# Patient Record
Sex: Female | Born: 1950
Health system: Southern US, Community
[De-identification: ages and names within clinical notes are randomized; demographics above are authoritative.]

## PROBLEM LIST (undated history)

## (undated) DIAGNOSIS — I10 Essential (primary) hypertension: Secondary | ICD-10-CM

## (undated) DIAGNOSIS — M810 Age-related osteoporosis without current pathological fracture: Secondary | ICD-10-CM

## (undated) DIAGNOSIS — E785 Hyperlipidemia, unspecified: Secondary | ICD-10-CM

## (undated) DIAGNOSIS — M1990 Unspecified osteoarthritis, unspecified site: Secondary | ICD-10-CM

## (undated) DIAGNOSIS — R011 Cardiac murmur, unspecified: Secondary | ICD-10-CM

## (undated) DIAGNOSIS — F329 Major depressive disorder, single episode, unspecified: Secondary | ICD-10-CM

## (undated) DIAGNOSIS — Z5189 Encounter for other specified aftercare: Secondary | ICD-10-CM

## (undated) DIAGNOSIS — L989 Disorder of the skin and subcutaneous tissue, unspecified: Secondary | ICD-10-CM

## (undated) DIAGNOSIS — F419 Anxiety disorder, unspecified: Secondary | ICD-10-CM

## (undated) DIAGNOSIS — F32A Depression, unspecified: Secondary | ICD-10-CM

## (undated) HISTORY — DX: Hyperlipidemia, unspecified: E78.5

## (undated) HISTORY — PX: TENDON REPAIR: SHX5111

## (undated) HISTORY — PX: TONSILLECTOMY: SUR1361

## (undated) HISTORY — DX: Major depressive disorder, single episode, unspecified: F32.9

## (undated) HISTORY — DX: Unspecified osteoarthritis, unspecified site: M19.90

## (undated) HISTORY — PX: OTHER SURGICAL HISTORY: SHX169

## (undated) HISTORY — DX: Encounter for other specified aftercare: Z51.89

## (undated) HISTORY — DX: Cardiac murmur, unspecified: R01.1

## (undated) HISTORY — DX: Depression, unspecified: F32.A

## (undated) HISTORY — DX: Disorder of the skin and subcutaneous tissue, unspecified: L98.9

## (undated) HISTORY — DX: Anxiety disorder, unspecified: F41.9

---
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
## 1998-08-01 ENCOUNTER — Emergency Department (HOSPITAL_COMMUNITY): Admission: EM | Admit: 1998-08-01 | Discharge: 1998-08-01 | Payer: Self-pay | Admitting: Emergency Medicine

## 1998-08-10 ENCOUNTER — Encounter: Admission: RE | Admit: 1998-08-10 | Discharge: 1998-08-10 | Payer: Self-pay | Admitting: Internal Medicine

## 1998-11-21 ENCOUNTER — Encounter: Admission: RE | Admit: 1998-11-21 | Discharge: 1998-11-21 | Payer: Self-pay | Admitting: Internal Medicine

## 1998-12-22 ENCOUNTER — Encounter: Admission: RE | Admit: 1998-12-22 | Discharge: 1998-12-22 | Payer: Self-pay | Admitting: Hematology and Oncology

## 1999-01-05 ENCOUNTER — Other Ambulatory Visit: Admission: RE | Admit: 1999-01-05 | Discharge: 1999-01-05 | Payer: Self-pay | Admitting: *Deleted

## 1999-01-05 ENCOUNTER — Encounter: Admission: RE | Admit: 1999-01-05 | Discharge: 1999-01-05 | Payer: Self-pay | Admitting: Internal Medicine

## 1999-02-27 ENCOUNTER — Encounter: Admission: RE | Admit: 1999-02-27 | Discharge: 1999-02-27 | Payer: Self-pay | Admitting: Internal Medicine

## 1999-07-11 ENCOUNTER — Encounter: Admission: RE | Admit: 1999-07-11 | Discharge: 1999-07-11 | Payer: Self-pay | Admitting: Hematology and Oncology

## 1999-07-27 ENCOUNTER — Encounter: Admission: RE | Admit: 1999-07-27 | Discharge: 1999-07-27 | Payer: Self-pay | Admitting: Internal Medicine

## 1999-08-08 ENCOUNTER — Encounter: Admission: RE | Admit: 1999-08-08 | Discharge: 1999-08-08 | Payer: Self-pay | Admitting: Hematology and Oncology

## 1999-09-22 ENCOUNTER — Encounter: Admission: RE | Admit: 1999-09-22 | Discharge: 1999-09-22 | Payer: Self-pay | Admitting: Obstetrics & Gynecology

## 1999-10-13 ENCOUNTER — Encounter: Admission: RE | Admit: 1999-10-13 | Discharge: 1999-10-13 | Payer: Self-pay | Admitting: Obstetrics & Gynecology

## 1999-10-27 ENCOUNTER — Encounter: Admission: RE | Admit: 1999-10-27 | Discharge: 1999-10-27 | Payer: Self-pay | Admitting: Internal Medicine

## 1999-11-24 ENCOUNTER — Encounter: Admission: RE | Admit: 1999-11-24 | Discharge: 1999-11-24 | Payer: Self-pay | Admitting: Internal Medicine

## 2000-01-16 ENCOUNTER — Encounter: Admission: RE | Admit: 2000-01-16 | Discharge: 2000-01-16 | Payer: Self-pay | Admitting: Obstetrics & Gynecology

## 2000-02-05 ENCOUNTER — Ambulatory Visit (HOSPITAL_COMMUNITY): Admission: RE | Admit: 2000-02-05 | Discharge: 2000-02-05 | Payer: Self-pay | Admitting: Obstetrics

## 2000-02-22 ENCOUNTER — Encounter: Admission: RE | Admit: 2000-02-22 | Discharge: 2000-02-22 | Payer: Self-pay | Admitting: Obstetrics

## 2000-03-21 ENCOUNTER — Encounter: Admission: RE | Admit: 2000-03-21 | Discharge: 2000-03-21 | Payer: Self-pay | Admitting: Obstetrics

## 2000-04-24 ENCOUNTER — Encounter: Admission: RE | Admit: 2000-04-24 | Discharge: 2000-04-24 | Payer: Self-pay | Admitting: Internal Medicine

## 2000-07-01 ENCOUNTER — Encounter: Admission: RE | Admit: 2000-07-01 | Discharge: 2000-07-01 | Payer: Self-pay | Admitting: Internal Medicine

## 2000-07-10 ENCOUNTER — Encounter: Payer: Self-pay | Admitting: *Deleted

## 2000-07-10 ENCOUNTER — Encounter: Admission: RE | Admit: 2000-07-10 | Discharge: 2000-07-10 | Payer: Self-pay | Admitting: *Deleted

## 2000-07-31 ENCOUNTER — Encounter: Admission: RE | Admit: 2000-07-31 | Discharge: 2000-07-31 | Payer: Self-pay | Admitting: Hematology and Oncology

## 2000-09-06 ENCOUNTER — Encounter: Admission: RE | Admit: 2000-09-06 | Discharge: 2000-09-06 | Payer: Self-pay | Admitting: Internal Medicine

## 2000-09-10 ENCOUNTER — Encounter: Admission: RE | Admit: 2000-09-10 | Discharge: 2000-09-10 | Payer: Self-pay | Admitting: Hematology and Oncology

## 2000-09-24 ENCOUNTER — Encounter: Admission: RE | Admit: 2000-09-24 | Discharge: 2000-09-24 | Payer: Self-pay | Admitting: Internal Medicine

## 2000-09-30 ENCOUNTER — Encounter: Admission: RE | Admit: 2000-09-30 | Discharge: 2000-09-30 | Payer: Self-pay | Admitting: Internal Medicine

## 2000-11-05 ENCOUNTER — Encounter: Admission: RE | Admit: 2000-11-05 | Discharge: 2000-11-05 | Payer: Self-pay | Admitting: Internal Medicine

## 2001-01-15 ENCOUNTER — Other Ambulatory Visit: Admission: RE | Admit: 2001-01-15 | Discharge: 2001-01-15 | Payer: Self-pay | Admitting: Internal Medicine

## 2001-01-16 ENCOUNTER — Encounter: Payer: Self-pay | Admitting: Internal Medicine

## 2001-01-16 ENCOUNTER — Ambulatory Visit (HOSPITAL_COMMUNITY): Admission: RE | Admit: 2001-01-16 | Discharge: 2001-01-16 | Payer: Self-pay | Admitting: Internal Medicine

## 2002-01-20 ENCOUNTER — Other Ambulatory Visit: Admission: RE | Admit: 2002-01-20 | Discharge: 2002-01-20 | Payer: Self-pay | Admitting: Internal Medicine

## 2002-05-15 ENCOUNTER — Encounter: Payer: Self-pay | Admitting: General Surgery

## 2002-05-15 ENCOUNTER — Inpatient Hospital Stay (HOSPITAL_COMMUNITY): Admission: EM | Admit: 2002-05-15 | Discharge: 2002-05-18 | Payer: Self-pay

## 2002-05-16 ENCOUNTER — Encounter: Payer: Self-pay | Admitting: General Surgery

## 2002-05-19 ENCOUNTER — Encounter: Admission: RE | Admit: 2002-05-19 | Discharge: 2002-08-17 | Payer: Self-pay | Admitting: General Surgery

## 2002-06-05 ENCOUNTER — Emergency Department (HOSPITAL_COMMUNITY): Admission: EM | Admit: 2002-06-05 | Discharge: 2002-06-06 | Payer: Self-pay | Admitting: Emergency Medicine

## 2002-08-18 ENCOUNTER — Encounter: Admission: RE | Admit: 2002-08-18 | Discharge: 2002-11-16 | Payer: Self-pay | Admitting: General Surgery

## 2002-09-07 ENCOUNTER — Encounter: Payer: Self-pay | Admitting: Physical Medicine & Rehabilitation

## 2002-09-07 ENCOUNTER — Ambulatory Visit (HOSPITAL_COMMUNITY)
Admission: RE | Admit: 2002-09-07 | Discharge: 2002-09-07 | Payer: Self-pay | Admitting: Physical Medicine & Rehabilitation

## 2003-01-18 ENCOUNTER — Encounter: Admission: RE | Admit: 2003-01-18 | Discharge: 2003-04-18 | Payer: Self-pay | Admitting: General Surgery

## 2003-03-15 ENCOUNTER — Encounter: Admission: RE | Admit: 2003-03-15 | Discharge: 2003-03-15 | Payer: Self-pay | Admitting: Family Medicine

## 2003-03-15 ENCOUNTER — Encounter: Payer: Self-pay | Admitting: Family Medicine

## 2003-04-22 ENCOUNTER — Encounter
Admission: RE | Admit: 2003-04-22 | Discharge: 2003-07-21 | Payer: Self-pay | Admitting: Physical Medicine & Rehabilitation

## 2003-05-26 ENCOUNTER — Encounter: Admission: RE | Admit: 2003-05-26 | Discharge: 2003-08-24 | Payer: Self-pay

## 2003-06-25 ENCOUNTER — Other Ambulatory Visit: Admission: RE | Admit: 2003-06-25 | Discharge: 2003-06-25 | Payer: Self-pay | Admitting: Internal Medicine

## 2003-06-28 ENCOUNTER — Other Ambulatory Visit: Admission: RE | Admit: 2003-06-28 | Discharge: 2003-06-28 | Payer: Self-pay | Admitting: Internal Medicine

## 2003-06-30 ENCOUNTER — Encounter: Payer: Self-pay | Admitting: Internal Medicine

## 2003-06-30 ENCOUNTER — Ambulatory Visit (HOSPITAL_COMMUNITY): Admission: RE | Admit: 2003-06-30 | Discharge: 2003-06-30 | Payer: Self-pay | Admitting: Internal Medicine

## 2003-08-17 ENCOUNTER — Other Ambulatory Visit: Admission: RE | Admit: 2003-08-17 | Discharge: 2003-08-17 | Payer: Self-pay | Admitting: Obstetrics and Gynecology

## 2003-08-17 ENCOUNTER — Encounter: Admission: RE | Admit: 2003-08-17 | Discharge: 2003-08-17 | Payer: Self-pay | Admitting: Obstetrics and Gynecology

## 2003-08-18 ENCOUNTER — Encounter (INDEPENDENT_AMBULATORY_CARE_PROVIDER_SITE_OTHER): Payer: Self-pay

## 2004-03-07 ENCOUNTER — Ambulatory Visit (HOSPITAL_COMMUNITY): Admission: RE | Admit: 2004-03-07 | Discharge: 2004-03-07 | Payer: Self-pay | Admitting: Internal Medicine

## 2004-09-11 ENCOUNTER — Ambulatory Visit: Payer: Self-pay | Admitting: Sports Medicine

## 2004-10-09 ENCOUNTER — Ambulatory Visit: Payer: Self-pay | Admitting: Family Medicine

## 2004-11-02 ENCOUNTER — Ambulatory Visit: Payer: Self-pay | Admitting: Sports Medicine

## 2004-11-03 ENCOUNTER — Ambulatory Visit (HOSPITAL_COMMUNITY): Admission: RE | Admit: 2004-11-03 | Discharge: 2004-11-03 | Payer: Self-pay | Admitting: *Deleted

## 2004-11-20 ENCOUNTER — Ambulatory Visit: Payer: Self-pay | Admitting: Sports Medicine

## 2004-12-26 ENCOUNTER — Ambulatory Visit: Payer: Self-pay | Admitting: Family Medicine

## 2005-01-16 ENCOUNTER — Ambulatory Visit (HOSPITAL_COMMUNITY): Admission: RE | Admit: 2005-01-16 | Discharge: 2005-01-16 | Payer: Self-pay | Admitting: *Deleted

## 2005-01-26 ENCOUNTER — Ambulatory Visit: Payer: Self-pay | Admitting: Sports Medicine

## 2005-04-13 ENCOUNTER — Ambulatory Visit: Payer: Self-pay | Admitting: Family Medicine

## 2005-04-24 ENCOUNTER — Ambulatory Visit: Payer: Self-pay | Admitting: Sports Medicine

## 2005-05-02 ENCOUNTER — Emergency Department (HOSPITAL_COMMUNITY): Admission: EM | Admit: 2005-05-02 | Discharge: 2005-05-02 | Payer: Self-pay | Admitting: Emergency Medicine

## 2005-05-10 ENCOUNTER — Ambulatory Visit: Payer: Self-pay | Admitting: Family Medicine

## 2005-05-10 ENCOUNTER — Encounter (INDEPENDENT_AMBULATORY_CARE_PROVIDER_SITE_OTHER): Payer: Self-pay | Admitting: *Deleted

## 2005-05-15 ENCOUNTER — Ambulatory Visit: Payer: Self-pay | Admitting: Sports Medicine

## 2005-05-23 ENCOUNTER — Ambulatory Visit: Payer: Self-pay | Admitting: Sports Medicine

## 2005-06-11 ENCOUNTER — Ambulatory Visit: Payer: Self-pay | Admitting: Sports Medicine

## 2005-06-26 ENCOUNTER — Ambulatory Visit: Payer: Self-pay | Admitting: Family Medicine

## 2005-07-06 ENCOUNTER — Ambulatory Visit: Payer: Self-pay | Admitting: Family Medicine

## 2005-07-31 ENCOUNTER — Ambulatory Visit: Payer: Self-pay | Admitting: Family Medicine

## 2005-09-14 ENCOUNTER — Ambulatory Visit: Payer: Self-pay | Admitting: Family Medicine

## 2005-10-16 ENCOUNTER — Ambulatory Visit: Payer: Self-pay | Admitting: Family Medicine

## 2006-02-13 ENCOUNTER — Ambulatory Visit: Payer: Self-pay | Admitting: Family Medicine

## 2006-02-13 ENCOUNTER — Encounter (INDEPENDENT_AMBULATORY_CARE_PROVIDER_SITE_OTHER): Payer: Self-pay | Admitting: Specialist

## 2006-02-15 ENCOUNTER — Emergency Department (HOSPITAL_COMMUNITY): Admission: EM | Admit: 2006-02-15 | Discharge: 2006-02-15 | Payer: Self-pay | Admitting: Family Medicine

## 2006-02-27 ENCOUNTER — Ambulatory Visit: Payer: Self-pay | Admitting: Family Medicine

## 2006-03-01 ENCOUNTER — Ambulatory Visit: Payer: Self-pay | Admitting: Sports Medicine

## 2006-04-02 ENCOUNTER — Ambulatory Visit (HOSPITAL_COMMUNITY): Admission: RE | Admit: 2006-04-02 | Discharge: 2006-04-02 | Payer: Self-pay | Admitting: Internal Medicine

## 2006-04-12 ENCOUNTER — Ambulatory Visit: Payer: Self-pay | Admitting: Family Medicine

## 2006-04-29 ENCOUNTER — Emergency Department (HOSPITAL_COMMUNITY): Admission: EM | Admit: 2006-04-29 | Discharge: 2006-04-29 | Payer: Self-pay | Admitting: Emergency Medicine

## 2006-05-06 ENCOUNTER — Ambulatory Visit (HOSPITAL_COMMUNITY): Admission: RE | Admit: 2006-05-06 | Discharge: 2006-05-06 | Payer: Self-pay | Admitting: Emergency Medicine

## 2006-05-06 ENCOUNTER — Emergency Department (HOSPITAL_COMMUNITY): Admission: EM | Admit: 2006-05-06 | Discharge: 2006-05-06 | Payer: Self-pay | Admitting: Emergency Medicine

## 2006-05-14 ENCOUNTER — Ambulatory Visit: Payer: Self-pay | Admitting: Sports Medicine

## 2006-06-03 ENCOUNTER — Ambulatory Visit: Payer: Self-pay | Admitting: Family Medicine

## 2006-06-28 ENCOUNTER — Ambulatory Visit: Payer: Self-pay | Admitting: Family Medicine

## 2006-07-24 ENCOUNTER — Ambulatory Visit: Payer: Self-pay | Admitting: Family Medicine

## 2006-08-06 ENCOUNTER — Ambulatory Visit: Payer: Self-pay | Admitting: Family Medicine

## 2006-08-17 ENCOUNTER — Encounter (INDEPENDENT_AMBULATORY_CARE_PROVIDER_SITE_OTHER): Payer: Self-pay | Admitting: *Deleted

## 2006-09-05 ENCOUNTER — Encounter (INDEPENDENT_AMBULATORY_CARE_PROVIDER_SITE_OTHER): Payer: Self-pay | Admitting: *Deleted

## 2006-09-05 ENCOUNTER — Ambulatory Visit: Payer: Self-pay | Admitting: Sports Medicine

## 2006-11-14 ENCOUNTER — Ambulatory Visit: Payer: Self-pay | Admitting: Family Medicine

## 2007-01-31 ENCOUNTER — Ambulatory Visit: Payer: Self-pay | Admitting: Family Medicine

## 2007-02-03 ENCOUNTER — Ambulatory Visit: Payer: Self-pay | Admitting: Vascular Surgery

## 2007-02-03 ENCOUNTER — Ambulatory Visit (HOSPITAL_COMMUNITY): Admission: RE | Admit: 2007-02-03 | Discharge: 2007-02-03 | Payer: Self-pay | Admitting: Family Medicine

## 2007-02-14 ENCOUNTER — Encounter (INDEPENDENT_AMBULATORY_CARE_PROVIDER_SITE_OTHER): Payer: Self-pay | Admitting: *Deleted

## 2007-02-24 ENCOUNTER — Ambulatory Visit: Payer: Self-pay | Admitting: Sports Medicine

## 2007-02-24 DIAGNOSIS — F329 Major depressive disorder, single episode, unspecified: Secondary | ICD-10-CM | POA: Insufficient documentation

## 2007-02-24 DIAGNOSIS — Z87891 Personal history of nicotine dependence: Secondary | ICD-10-CM | POA: Insufficient documentation

## 2007-03-10 ENCOUNTER — Telehealth: Payer: Self-pay | Admitting: *Deleted

## 2007-03-11 ENCOUNTER — Telehealth: Payer: Self-pay | Admitting: *Deleted

## 2007-03-14 ENCOUNTER — Telehealth (INDEPENDENT_AMBULATORY_CARE_PROVIDER_SITE_OTHER): Payer: Self-pay | Admitting: Family Medicine

## 2007-03-18 ENCOUNTER — Telehealth (INDEPENDENT_AMBULATORY_CARE_PROVIDER_SITE_OTHER): Payer: Self-pay | Admitting: Family Medicine

## 2007-03-19 ENCOUNTER — Encounter (INDEPENDENT_AMBULATORY_CARE_PROVIDER_SITE_OTHER): Payer: Self-pay | Admitting: Family Medicine

## 2007-03-19 DIAGNOSIS — E785 Hyperlipidemia, unspecified: Secondary | ICD-10-CM | POA: Insufficient documentation

## 2007-03-20 ENCOUNTER — Telehealth (INDEPENDENT_AMBULATORY_CARE_PROVIDER_SITE_OTHER): Payer: Self-pay | Admitting: Family Medicine

## 2007-03-24 ENCOUNTER — Telehealth: Payer: Self-pay | Admitting: *Deleted

## 2007-05-26 ENCOUNTER — Ambulatory Visit: Payer: Self-pay | Admitting: Sports Medicine

## 2007-05-26 ENCOUNTER — Encounter (INDEPENDENT_AMBULATORY_CARE_PROVIDER_SITE_OTHER): Payer: Self-pay | Admitting: Family Medicine

## 2007-05-26 LAB — CONVERTED CEMR LAB: LDL Cholesterol: 132 mg/dL — ABNORMAL HIGH (ref 0–99)

## 2007-06-10 ENCOUNTER — Ambulatory Visit: Payer: Self-pay | Admitting: Family Medicine

## 2007-06-10 ENCOUNTER — Encounter (INDEPENDENT_AMBULATORY_CARE_PROVIDER_SITE_OTHER): Payer: Self-pay | Admitting: Family Medicine

## 2007-06-10 DIAGNOSIS — Q828 Other specified congenital malformations of skin: Secondary | ICD-10-CM | POA: Insufficient documentation

## 2007-07-14 ENCOUNTER — Ambulatory Visit: Payer: Self-pay | Admitting: Family Medicine

## 2007-10-15 ENCOUNTER — Encounter (INDEPENDENT_AMBULATORY_CARE_PROVIDER_SITE_OTHER): Payer: Self-pay | Admitting: Family Medicine

## 2007-10-15 ENCOUNTER — Encounter: Admission: RE | Admit: 2007-10-15 | Discharge: 2007-10-15 | Payer: Self-pay | Admitting: Sports Medicine

## 2007-10-15 ENCOUNTER — Ambulatory Visit: Payer: Self-pay

## 2007-10-16 ENCOUNTER — Encounter (INDEPENDENT_AMBULATORY_CARE_PROVIDER_SITE_OTHER): Payer: Self-pay | Admitting: Family Medicine

## 2007-10-16 LAB — CONVERTED CEMR LAB
AST: 22 units/L (ref 0–37)
Albumin: 4.2 g/dL (ref 3.5–5.2)
Alkaline Phosphatase: 68 units/L (ref 39–117)
BUN: 9 mg/dL (ref 6–23)
Calcium: 8.9 mg/dL (ref 8.4–10.5)
Chloride: 106 meq/L (ref 96–112)
Cholesterol: 176 mg/dL (ref 0–200)
Glucose, Bld: 79 mg/dL (ref 70–99)
HDL: 31 mg/dL — ABNORMAL LOW (ref >39)
Sodium: 142 meq/L (ref 135–145)
Total Bilirubin: 0.5 mg/dL (ref 0.3–1.2)
Triglycerides: 156 mg/dL — ABNORMAL HIGH (ref <150)
VLDL: 31 mg/dL (ref 0–40)

## 2007-10-21 ENCOUNTER — Ambulatory Visit: Payer: Self-pay | Admitting: Family Medicine

## 2008-01-02 ENCOUNTER — Encounter (INDEPENDENT_AMBULATORY_CARE_PROVIDER_SITE_OTHER): Payer: Self-pay | Admitting: Family Medicine

## 2008-01-02 ENCOUNTER — Ambulatory Visit: Payer: Self-pay | Admitting: Family Medicine

## 2008-01-02 LAB — CONVERTED CEMR LAB

## 2008-01-04 ENCOUNTER — Encounter (INDEPENDENT_AMBULATORY_CARE_PROVIDER_SITE_OTHER): Payer: Self-pay | Admitting: Family Medicine

## 2008-02-18 ENCOUNTER — Telehealth: Payer: Self-pay | Admitting: *Deleted

## 2008-05-04 ENCOUNTER — Encounter (INDEPENDENT_AMBULATORY_CARE_PROVIDER_SITE_OTHER): Payer: Self-pay | Admitting: Family Medicine

## 2008-05-21 ENCOUNTER — Emergency Department (HOSPITAL_COMMUNITY): Admission: EM | Admit: 2008-05-21 | Discharge: 2008-05-21 | Payer: Self-pay | Admitting: Emergency Medicine

## 2008-05-28 ENCOUNTER — Telehealth: Payer: Self-pay | Admitting: *Deleted

## 2008-05-31 ENCOUNTER — Ambulatory Visit: Payer: Self-pay | Admitting: Family Medicine

## 2008-06-16 ENCOUNTER — Encounter (INDEPENDENT_AMBULATORY_CARE_PROVIDER_SITE_OTHER): Payer: Self-pay | Admitting: Family Medicine

## 2008-06-16 ENCOUNTER — Ambulatory Visit: Payer: Self-pay | Admitting: Family Medicine

## 2008-06-17 ENCOUNTER — Encounter (INDEPENDENT_AMBULATORY_CARE_PROVIDER_SITE_OTHER): Payer: Self-pay | Admitting: Family Medicine

## 2008-06-17 LAB — CONVERTED CEMR LAB
ALT: 16 units/L (ref 0–35)
CO2: 26 meq/L (ref 19–32)
Creatinine, Ser: 1.09 mg/dL (ref 0.40–1.20)
Total Bilirubin: 0.5 mg/dL (ref 0.3–1.2)
Total Protein: 7.2 g/dL (ref 6.0–8.3)

## 2008-09-20 ENCOUNTER — Encounter (INDEPENDENT_AMBULATORY_CARE_PROVIDER_SITE_OTHER): Payer: Self-pay | Admitting: Family Medicine

## 2008-10-08 ENCOUNTER — Ambulatory Visit: Payer: Self-pay | Admitting: Family Medicine

## 2008-10-08 ENCOUNTER — Encounter (INDEPENDENT_AMBULATORY_CARE_PROVIDER_SITE_OTHER): Payer: Self-pay | Admitting: Family Medicine

## 2008-10-08 LAB — CONVERTED CEMR LAB
ALT: 12 units/L (ref 0–35)
AST: 16 units/L (ref 0–37)
BUN: 25 mg/dL — ABNORMAL HIGH (ref 6–23)
CO2: 24 meq/L (ref 19–32)
Creatinine, Ser: 1.1 mg/dL (ref 0.40–1.20)
HDL: 38 mg/dL — ABNORMAL LOW (ref >39)
Potassium: 4.4 meq/L (ref 3.5–5.3)
Total Protein: 6.8 g/dL (ref 6.0–8.3)
Triglycerides: 104 mg/dL (ref <150)
VLDL: 21 mg/dL (ref 0–40)

## 2008-10-09 ENCOUNTER — Encounter (INDEPENDENT_AMBULATORY_CARE_PROVIDER_SITE_OTHER): Payer: Self-pay | Admitting: Family Medicine

## 2008-10-12 ENCOUNTER — Encounter (INDEPENDENT_AMBULATORY_CARE_PROVIDER_SITE_OTHER): Payer: Self-pay | Admitting: Family Medicine

## 2008-10-13 ENCOUNTER — Encounter (INDEPENDENT_AMBULATORY_CARE_PROVIDER_SITE_OTHER): Payer: Self-pay | Admitting: Family Medicine

## 2008-10-20 ENCOUNTER — Encounter (INDEPENDENT_AMBULATORY_CARE_PROVIDER_SITE_OTHER): Payer: Self-pay | Admitting: Family Medicine

## 2009-02-10 ENCOUNTER — Ambulatory Visit (HOSPITAL_COMMUNITY): Admission: RE | Admit: 2009-02-10 | Discharge: 2009-02-10 | Payer: Self-pay | Admitting: Family Medicine

## 2009-02-10 ENCOUNTER — Ambulatory Visit: Payer: Self-pay | Admitting: Family Medicine

## 2009-02-10 ENCOUNTER — Encounter (INDEPENDENT_AMBULATORY_CARE_PROVIDER_SITE_OTHER): Payer: Self-pay | Admitting: Family Medicine

## 2009-02-10 LAB — CONVERTED CEMR LAB
Alkaline Phosphatase: 73 units/L (ref 39–117)
Calcium: 9.3 mg/dL (ref 8.4–10.5)
Creatinine, Ser: 1.05 mg/dL (ref 0.40–1.20)
Total Bilirubin: 0.8 mg/dL (ref 0.3–1.2)
Total Protein: 7.7 g/dL (ref 6.0–8.3)

## 2009-02-11 ENCOUNTER — Encounter (INDEPENDENT_AMBULATORY_CARE_PROVIDER_SITE_OTHER): Payer: Self-pay | Admitting: Family Medicine

## 2009-06-15 ENCOUNTER — Encounter (INDEPENDENT_AMBULATORY_CARE_PROVIDER_SITE_OTHER): Payer: Self-pay | Admitting: Family Medicine

## 2009-06-22 ENCOUNTER — Ambulatory Visit: Payer: Self-pay | Admitting: Family Medicine

## 2010-01-09 ENCOUNTER — Encounter: Payer: Self-pay | Admitting: Family Medicine

## 2010-01-09 ENCOUNTER — Ambulatory Visit: Payer: Self-pay | Admitting: Family Medicine

## 2010-01-09 LAB — CONVERTED CEMR LAB
LDL Cholesterol: 87 mg/dL (ref 0–99)
Total CHOL/HDL Ratio: 3.3
VLDL: 30 mg/dL (ref 0–40)

## 2010-01-10 ENCOUNTER — Encounter: Payer: Self-pay | Admitting: Family Medicine

## 2010-03-23 ENCOUNTER — Telehealth: Payer: Self-pay | Admitting: Family Medicine

## 2010-06-05 ENCOUNTER — Telehealth: Payer: Self-pay | Admitting: Family Medicine

## 2010-07-25 ENCOUNTER — Ambulatory Visit (HOSPITAL_COMMUNITY): Admission: RE | Admit: 2010-07-25 | Discharge: 2010-07-25 | Payer: Self-pay | Admitting: Ophthalmology

## 2010-07-27 ENCOUNTER — Ambulatory Visit: Payer: Self-pay | Admitting: Family Medicine

## 2010-08-01 ENCOUNTER — Ambulatory Visit: Payer: Self-pay | Admitting: Family Medicine

## 2010-08-01 ENCOUNTER — Encounter: Payer: Self-pay | Admitting: Family Medicine

## 2010-08-01 DIAGNOSIS — M7989 Other specified soft tissue disorders: Secondary | ICD-10-CM

## 2010-08-01 DIAGNOSIS — M79662 Pain in left lower leg: Secondary | ICD-10-CM | POA: Insufficient documentation

## 2010-08-01 LAB — CONVERTED CEMR LAB
ALT: 12 units/L (ref 0–35)
Cholesterol: 103 mg/dL (ref 0–200)
HDL: 36 mg/dL — ABNORMAL LOW (ref >39)
Total Protein: 6.7 g/dL (ref 6.0–8.3)
Triglycerides: 101 mg/dL (ref <150)

## 2010-08-09 ENCOUNTER — Telehealth: Payer: Self-pay | Admitting: Family Medicine

## 2010-08-17 ENCOUNTER — Ambulatory Visit: Payer: Self-pay | Admitting: Family Medicine

## 2010-08-23 ENCOUNTER — Ambulatory Visit: Payer: Self-pay | Admitting: Family Medicine

## 2010-08-23 LAB — CONVERTED CEMR LAB: Pap Smear: NEGATIVE

## 2010-10-20 ENCOUNTER — Encounter: Payer: Self-pay | Admitting: Family Medicine

## 2010-10-27 ENCOUNTER — Emergency Department (HOSPITAL_COMMUNITY)
Admission: EM | Admit: 2010-10-27 | Discharge: 2010-10-27 | Payer: Self-pay | Source: Home / Self Care | Admitting: Emergency Medicine

## 2010-11-01 ENCOUNTER — Emergency Department (HOSPITAL_COMMUNITY): Admission: EM | Admit: 2010-11-01 | Discharge: 2010-11-01 | Payer: Self-pay | Admitting: Family Medicine

## 2010-11-07 ENCOUNTER — Ambulatory Visit: Payer: Self-pay | Admitting: Family Medicine

## 2010-11-07 DIAGNOSIS — H60399 Other infective otitis externa, unspecified ear: Secondary | ICD-10-CM | POA: Insufficient documentation

## 2010-11-08 ENCOUNTER — Ambulatory Visit: Payer: Self-pay | Admitting: Family Medicine

## 2010-12-14 ENCOUNTER — Ambulatory Visit: Payer: Self-pay | Admitting: Family Medicine

## 2010-12-14 DIAGNOSIS — M546 Pain in thoracic spine: Secondary | ICD-10-CM | POA: Insufficient documentation
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
## 2011-01-16 NOTE — Assessment & Plan Note (Signed)
Summary: BUNION/KH   Vital Signs:  Patient profile:   60 year old female Height:      63 inches Weight:      123 pounds BMI:     21.87 Temp:     98.8 degrees F oral Pulse rate:   72 / minute BP sitting:   140 / 83  (right arm) Cuff size:   regular  Vitals Entered By: Jimmy Footman, CMA (August 17, 2010 11:18 AM) CC: Darier's disease and right foot callus Is Patient Diabetic? No   CC:  Darier's disease and right foot callus.  History of Present Illness: 1. Darier's disease: Improved with steroid course.  No blisters.  Not using the Triamcinolone cream because it was too expensive, she thinks that she can get it cheaper elsewhere.  She has an appt with a Dermatologist on 08/24/10   2. Right foot callus - Been there for months.  Thinks that it is from her shoes rubbing on that spot.  Located on the medial part of her heel.  Procedure note:  -Callus identified at medial portion of heel and midfoot -Callus paired down with a 15 blade -No anesthesia was required -No blood loss -No complications -Pt tolerated procedure well     Habits & Providers  Alcohol-Tobacco-Diet     Tobacco Status: current     Tobacco Counseling: to quit use of tobacco products  Comments: reviewed  Current Medications (verified): 1)  Amitriptyline Hcl 150 Mg  Tabs (Amitriptyline Hcl) .... One By Mouth Daily 2)  Lipitor 20 Mg  Tabs (Atorvastatin Calcium) .... One By Mouth Daily 3)  Baby Aspirin 81 Mg  Chew (Aspirin) .... One Daily 4)  Sarna Sensitive 1 % Lotn (Pramoxine Hcl) .... As Needed 5)  Zyrtec Hives Relief 10 Mg Tabs (Cetirizine Hcl) .Marland Kitchen.. 1 Tab By Mouth Daily 6)  Diphenhydramine Hcl 50 Mg Caps (Diphenhydramine Hcl) .Marland Kitchen.. 1 Tab By Mouth At Night 7)  Prednisone 10 Mg Tabs (Prednisone) .Marland Kitchen.. 1 Tab By Mouth Daily For 5 Days 8)  Fish Oil 1000 Mg Caps (Omega-3 Fatty Acids) .... 2 Tabs By Mouth Two Times A Day 9)  Triamcinolone Acetonide 0.1 % Crea (Triamcinolone Acetonide) .... Apply To  Affected Area Twice A Day Dispo: Large Tube  Allergies: 1)  ! Penicillin 2)  Penicillin G Potassium (Penicillin G Potassium)  Physical Exam  General:  vitals reviewed, no acute distress Skin:  extensive red, raised, papular rash all over her legs, groin, and trunk.  No areas of secondary infection appreciated.  Overall improved Additional Exam:  Feet:  extensive thickening of plantar surface of both feet.  Largest callous on right foot.   Impression & Recommendations:  Problem # 1:  DARIER'S DISEASE (ICD-757.39) Assessment Improved  Orders: [facility]- Est Level  3 (87564)  Problem # 2:  CALLUS, RIGHT FOOT (ICD-700)  Orders: [facility]- Est Level  3 (33295) [facility] (18841)  Complete Medication List: 1)  Amitriptyline Hcl 150 Mg Tabs (Amitriptyline hcl) .... One by mouth daily 2)  Lipitor 20 Mg Tabs (Atorvastatin calcium) .... One by mouth daily 3)  Baby Aspirin 81 Mg Chew (Aspirin) .... One daily 4)  Sarna Sensitive 1 % Lotn (Pramoxine hcl) .... As needed 5)  Zyrtec Hives Relief 10 Mg Tabs (Cetirizine hcl) .Marland Kitchen.. 1 tab by mouth daily 6)  Diphenhydramine Hcl 50 Mg Caps (Diphenhydramine hcl) .Marland Kitchen.. 1 tab by mouth at night 7)  Prednisone 10 Mg Tabs (Prednisone) .Marland Kitchen.. 1 tab by mouth daily for 5  days 8)  Fish Oil 1000 Mg Caps (Omega-3 fatty acids) .... 2 tabs by mouth two times a day 9)  Triamcinolone Acetonide 0.1 % Crea (Triamcinolone acetonide) .... Apply to affected area twice a day dispo: large tube Prescriptions: TRIAMCINOLONE ACETONIDE 0.1 % CREA (TRIAMCINOLONE ACETONIDE) Apply to affected area twice a day Dispo: Large tube  #1 x 3   Entered and Authorized by:   Angelena Sole MD   Signed by:   Angelena Sole MD on 08/17/2010   Method used:   Print then Give to Patient   RxID:   [id number]

## 2011-01-16 NOTE — Progress Notes (Signed)
Summary: Rx Req  Phone Note Call from Patient Call back at [phone]   Caller: Patient Summary of Call: Needs new rx for Lipator written out for her to pick up.  Using new place for her pharmacy. - going thru [facility] she is on her way here now Initial call taken by: Clydell Hakim,  March 23, 2010 4:35 PM  Follow-up for Phone Call        she is in the waiting room. I printed out a rx & gave it to her. told her if there are any problms at [facility], call me back Follow-up by: Golden Circle RN,  March 28, 2010 10:50 AM    Prescriptions: LIPITOR 20 MG  TABS (ATORVASTATIN CALCIUM) one by mouth daily  #90 x 3   Entered by:   Golden Circle RN   Authorized by:   Angelena Sole MD   Signed by:   Golden Circle RN on 03/28/2010   Method used:   Print then Give to Patient   RxID:   [id number]  she is here already. have her the rx.Golden Circle RN  March 28, 2010 10:53 AM

## 2011-01-16 NOTE — Progress Notes (Signed)
Summary: Rx Req  Phone Note Call from Patient Call back at [phone]   Caller: Patient Summary of Call: Needs antibotic before her dental appt on Thursday the 23.  She is a heart pt.  Pharmacy is [facility] [address]  Initial call taken by: Clydell Hakim,  June 05, 2010 3:31 PM  Follow-up for Phone Call        The new guidelines only recommend pts require antibiotic prophylaxis if she has one of the following: -artificial heart valves  -a history of infective endocarditis  -certain specific, serious congenital (present from birth) heart conditions, including  1. unrepaired or incompletely repaired cyanotic congenital heart disease, including those with palliative shunts and conduits  2. a completely repaired congenital heart defect with prosthetic material or device, whether placed by surgery or by catheter intervention, during the first six months after the procedure  3. any repaired congenital heart defect with residual defect at the site or adjacent to the site of a prosthetic patch or a prosthetic device  -a cardiac transplant that develops a problem in a heart valve.    I do not think that she has any of these so would not require any antibiotics.  Please confirm her heart condition. Follow-up by: Angelena Sole MD,  June 06, 2010 1:08 PM  Additional Follow-up for Phone Call Additional follow up Details #1::        states she was born with a heart condition & had open heart surgery when she was 60 yrs old. states she has a blockage? & :poor blood flow" states she has been told to ALWAYS take antibiotics prior to dentist, etc. has not seen cardiologist in years due to lack of money Additional Follow-up by: Golden Circle RN,  June 06, 2010 1:42 PM    Additional Follow-up for Phone Call Additional follow up Details #2::    Rx sent in Follow-up by: Angelena Sole MD,  June 06, 2010 2:04 PM  New/Updated Medications: ERYTHROMYCIN ETHYLSUCCINATE 400 MG TABS (ERYTHROMYCIN  ETHYLSUCCINATE) Take 2 tabs 2 hours prior to procedure and the 1 tab 4 hours after Prescriptions: ERYTHROMYCIN ETHYLSUCCINATE 400 MG TABS (ERYTHROMYCIN ETHYLSUCCINATE) Take 2 tabs 2 hours prior to procedure and the 1 tab 4 hours after  #3 x 0   Entered and Authorized by:   Angelena Sole MD   Signed by:   Angelena Sole MD on 06/06/2010   Method used:   Electronically to        [facility] (retail)       [address]       [place], [place]  [address]       Ph: [phone]       Fax: [phone]   RxID:   [id number]  pt notified.Golden Circle RN  June 06, 2010 2:39 PM

## 2011-01-16 NOTE — Assessment & Plan Note (Signed)
Summary: flu shot,df  Nurse Visit   Vital Signs:  Patient profile:   60 year old female Temp:     98.6 degrees F  Vitals Entered By: Theresia Lo RN (November 08, 2010 5:11 PM)  Allergies: 1)  ! Penicillin 2)  Penicillin G Potassium (Penicillin G Potassium)  Immunizations Administered:  Influenza Vaccine # 1:    Vaccine Type: Fluvax 3+    Site: right deltoid    Mfr: GlaxoSmithKline    Dose: 0.5 ml    Route: IM    Given by: Theresia Lo RN    Exp. Date: 06/16/2011    Lot #: [id number]    VIS given: 07/11/10 version given November 08, 2010.  Flu Vaccine Consent Questions:    Do you have a history of severe allergic reactions to this vaccine? no    Any prior history of allergic reactions to egg and/or gelatin? no    Do you have a sensitivity to the preservative Thimersol? no    Do you have a past history of Guillan-Barre Syndrome? no    Do you currently have an acute febrile illness? no    Have you ever had a severe reaction to latex? no    Vaccine information given and explained to patient? yes    Are you currently pregnant? no  Orders Added: 1)  Flu Vaccine 18yrs + [90658] 2)  Admin 1st Vaccine [09811]

## 2011-01-16 NOTE — Miscellaneous (Signed)
  Clinical Lists Changes  Problems: Removed problem of VAGINAL DISCHARGE (ICD-623.5) Removed problem of OTHER SCREENING MAMMOGRAM (ICD-V76.12) Removed problem of UNSPECIFIED VAGINITIS AND VULVOVAGINITIS (ICD-616.10) Removed problem of CALLUS, RIGHT FOOT (ICD-700) Removed problem of FOOT PAIN, RIGHT (ICD-729.5) Removed problem of ENCOUNTER FOR LONG-TERM USE OF OTHER MEDICATIONS (ICD-V58.69) Removed problem of SCREENING FOR MALIGNANT NEOPLASM OF THE CERVIX (ICD-V76.2) Removed problem of PVD (ICD-443.9) Removed problem of VAGINITIS, ATROPHIC (ICD-627.3) Removed problem of PALPITATIONS (ICD-785.1)

## 2011-01-16 NOTE — Assessment & Plan Note (Signed)
Summary: F/U/KH   Vital Signs:  Patient profile:   60 year old female Height:      63 inches Weight:      123 pounds BMI:     21.87 BSA:     1.57 Temp:     98.3 degrees F Pulse rate:   78 / minute BP sitting:   122 / 70  Vitals Entered By: Jone Baseman CMA (August 23, 2010 3:05 PM) CC: f/u meds Is Patient Diabetic? No Pain Assessment Patient in pain? no        CC:  f/u meds.  History of Present Illness: 1. Vaginal discharge - Off and on for the past couple of weeks - Yellow discharge - Has never had a yeast infection before  ROS: denies vaginal burning, bleeding, or dysuria  2. Darier's disease - Overall it is getting better - Has appt with dermatologist tomorrow - Has been using the Triamcinolone cream that she mixes with Serna  ROS: denies fevers, blisters  3. Right foot callus - Feels better than last time - No bleeding - Got some new foot cream which she thinks helps as well as a PedEgg  Habits & Providers  Alcohol-Tobacco-Diet     Tobacco Status: current     Tobacco Counseling: to quit use of tobacco products     Cigarette Packs/Day: 0.5  Comments: Noted  Current Medications (verified): 1)  Amitriptyline Hcl 150 Mg  Tabs (Amitriptyline Hcl) .... One By Mouth Daily 2)  Lipitor 20 Mg  Tabs (Atorvastatin Calcium) .... One By Mouth Daily 3)  Baby Aspirin 81 Mg  Chew (Aspirin) .... One Daily 4)  Sarna Sensitive 1 % Lotn (Pramoxine Hcl) .... As Needed 5)  Zyrtec Hives Relief 10 Mg Tabs (Cetirizine Hcl) .Marland Kitchen.. 1 Tab By Mouth Daily 6)  Diphenhydramine Hcl 50 Mg Caps (Diphenhydramine Hcl) .Marland Kitchen.. 1 Tab By Mouth At Night 7)  Prednisone 10 Mg Tabs (Prednisone) .Marland Kitchen.. 1 Tab By Mouth Daily For 5 Days 8)  Fish Oil 1000 Mg Caps (Omega-3 Fatty Acids) .... 2 Tabs By Mouth Two Times A Day 9)  Triamcinolone Acetonide 0.1 % Crea (Triamcinolone Acetonide) .... Apply To Affected Area Twice A Day Dispo: Large Tube  Allergies: 1)  ! Penicillin 2)  Penicillin G  Potassium (Penicillin G Potassium)  Physical Exam  General:  vitals reviewed, no acute distress Lungs:  normal respiratory effort and normal breath sounds.   Heart:  normal rate, blowing systolic ejection murmur (normal per pt.)  2+ peripheral pulses   Abdomen:  soft and non-tender.   Genitalia:  Normal introitus for age, no external lesions, no vaginal discharge, mucosa pink and moist, no vaginal or cervical lesions, no vaginal atrophy, no friaility or hemorrhage, normal uterus size and position, no adnexal masses or tenderness Extremities:  no lower extremity edema Skin:  extensive red, raised, papular rash all over her legs, groin, and trunk.  No areas of secondary infection appreciated.  Overall improved Psych:  not anxious appearing and not depressed appearing.   Additional Exam:  Feet:  extensive thickening of plantar surface of both feet.  Largest callous on right foot is improved after procedure.   Impression & Recommendations:  Problem # 1:  VAGINAL DISCHARGE (ICD-623.5) Assessment New  Likely normal physiologic discharge.    Orders: [facility]- Est  Level 4 (18841)  Problem # 2:  DARIER'S DISEASE (ICD-757.39) Assessment: Improved  Overall improved.  Continue Triamcinolone mixed with Sarna.  Follow up with Dermatology tomorrow.  Orders:  [facility]- Est  Level 4 (99214)  Problem # 3:  CALLUS, RIGHT FOOT (ICD-700) Assessment: Improved  Doing better.  Use PedEgg and cream.  Orders: [facility]- Est  Level 4 (99214)  Complete Medication List: 1)  Amitriptyline Hcl 150 Mg Tabs (Amitriptyline hcl) .... One by mouth daily 2)  Lipitor 20 Mg Tabs (Atorvastatin calcium) .... One by mouth daily 3)  Baby Aspirin 81 Mg Chew (Aspirin) .... One daily 4)  Sarna Sensitive 1 % Lotn (Pramoxine hcl) .... As needed 5)  Zyrtec Hives Relief 10 Mg Tabs (Cetirizine hcl) .Marland Kitchen.. 1 tab by mouth daily 6)  Diphenhydramine Hcl 50 Mg Caps (Diphenhydramine hcl) .Marland Kitchen.. 1 tab by mouth at night 7)  Fish Oil 1000 Mg Caps  (Omega-3 fatty acids) .... 2 tabs by mouth two times a day 8)  Triamcinolone Acetonide 0.1 % Crea (Triamcinolone acetonide) .... Apply to affected area twice a day dispo: large tube  Other Orders: [facility] ([id number]) [facility]  Patient Instructions: 1)  I doesn't appear that you have a yeast infection 2)  I will let you know of the results 3)  Please schedule a follow up appointment with me in 3 months  Prevention & Chronic Care Immunizations   Influenza vaccine: Fluvax 3+  (10/21/2007)   Influenza vaccine due: 10/20/2008    Tetanus booster: 04/16/2005: Done.   Tetanus booster due: 04/17/2015    Pneumococcal vaccine: Not documented    H. zoster vaccine: Not documented  Colorectal Screening   Hemoccult: Done.  (04/16/2006)   Hemoccult due: Not Indicated    Colonoscopy: Results: Normal.   (06/17/2003)  Other Screening   Pap smear: normal  (01/06/2008)   Pap smear action/deferral: Ordered  (08/23/2010)   Pap smear due: 01/05/2009    Mammogram: Done.  (03/17/2006)   Mammogram action/deferral: Ordered  (08/23/2010)   Mammogram due: 03/18/2007    DXA bone density scan: Not documented   Smoking status: current  (08/23/2010)   Smoking cessation counseling: yes  (06/16/2008)  Lipids   Total Cholesterol: 103  (08/01/2010)   Lipid panel action/deferral: Lipid Panel ordered   LDL: 47  (08/01/2010)   LDL Direct: 113  (06/16/2008)   HDL: 36  (08/01/2010)   Triglycerides: 101  (08/01/2010)    SGOT (AST): 19  (08/01/2010)   BMP action: Ordered   SGPT (ALT): 12  (08/01/2010)   Alkaline phosphatase: 59  (08/01/2010)   Total bilirubin: 0.4  (08/01/2010)  Self-Management Support :   Personal Goals (by the next clinic visit) :      Personal LDL goal: 130  (01/09/2010)    Lipid self-management support: Not documented    Nursing Instructions: Pap smear today Schedule screening mammogram (see order)   Appended Document: wet prep report    Lab  Visit  Laboratory Results  Date/Time Received: August 23, 2010 3:49 PM  Date/Time Reported: August 23, 2010 4:52 PM   [employer] Source: vag WBC/hpf: rare Bacteria/hpf: 3+  Cocci Clue cells/hpf: moderate  Positive whiff Yeast/hpf: none Trichomonas/hpf: none Comments: ...............test performed by......Marland KitchenBonnie A. [place], MLS (ASCP)cm   Orders Today:  left message about lab results and that she'll need to take antibiotics for the bacterial infection.  Rx sent in to the pharmacy. Angelena Sole MD  August 23, 2010 4:58 PM
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
## 2011-01-16 NOTE — Progress Notes (Signed)
Summary: meds prob  Phone Note Call from Patient Call back at Home Phone [phone]   Caller: Patient Summary of Call: pt cannot afford the hydrocordisone cream and wants to know if she can get the cream from the health dept that she mixes with serna  Initial call taken by: De Nurse,  August 09, 2010 3:38 PM  Follow-up for Phone Call        to md Follow-up by: Golden Circle RN,  August 09, 2010 4:11 PM  Additional Follow-up for Phone Call Additional follow up Details #1::        please ask her which steroid cream she would prefer Additional Follow-up by: Angelena Sole MD,  August 10, 2010 4:42 PM    Additional Follow-up for Phone Call Additional follow up Details #2::    states it was a small tube only. it is tram0.1% with sarna per pt. states serna is otc. she can mix it with the serna.  uses health dept Follow-up by: Golden Circle RN,  August 10, 2010 4:59 PM  New/Updated Medications: TRIAMCINOLONE ACETONIDE 0.1 % CREA (TRIAMCINOLONE ACETONIDE) Apply to affected area twice a day Dispo: Large tube Prescriptions: TRIAMCINOLONE ACETONIDE 0.1 % CREA (TRIAMCINOLONE ACETONIDE) Apply to affected area twice a day Dispo: Large tube  #1 x 3   Entered and Authorized by:   Angelena Sole MD   Signed by:   Angelena Sole MD on 08/11/2010   Method used:   Print then Give to Patient   RxID:   [id number]

## 2011-01-16 NOTE — Assessment & Plan Note (Signed)
Summary: fu meds/kh   Vital Signs:  Patient profile:   60 year old female Height:      63 inches Weight:      134.7 pounds BMI:     23.95 Temp:     98.1 degrees F oral Pulse rate:   64 / minute BP sitting:   113 / 78  (left arm) Cuff size:   regular  Vitals Entered By: Garen Grams LPN (January 09, 2010 9:31 AM) CC: f/u Is Patient Diabetic? No Pain Assessment Patient in pain? no        CC:  f/u.  History of Present Illness: Supplements: Beta Carotene, Vitamin C, Panax/Ginseng, Selenium, Soya Lecithin, B Complex, Biotin, Vision Formula, MV, Ca600 and Vit D.  1. Hyperlipidemia:  She is taking the Lipitor as prescribed.  Has not been checked in over a year.      ROS: denies any chest pain or claudication  2. Right elbow pain: Began hurting 2 weeks ago.  No joint swelling or effusion.  No injury to the area  3. Knee pain: Bilateral knee pain.  Has been hurting for years.  Has been told that she has arthritis.  Takes Ibuprofen as needed  4. Depression: Stable on Amitryptalline.  She thinks that 150mg  makes her sleepy so would prefer taking 100mg .  5. Tobacco use: Continues to smoke.  Now down to 1 cigarette per week.  Is going to try and quit smoking altogether.  Habits & Providers  Alcohol-Tobacco-Diet     Tobacco Status: current     Cigarette Packs/Day: <0.25  Colonoscopy  Procedure date:  06/17/2003  Findings:      Results: Normal.   Current Medications (verified): 1)  Amitriptyline Hcl 150 Mg  Tabs (Amitriptyline Hcl) .... One By Mouth Daily 2)  Lipitor 20 Mg  Tabs (Atorvastatin Calcium) .... One By Mouth Daily 3)  Premarin 0.625 Mg/gm  Crea (Estrogens, Conjugated) .... One Gram Per Vagina Daily For 1 Week, Three Times A Week For Next Week, Then Weekly To Twice A Weekly Thereafter. Dispense One Tube 4)  Baby Aspirin 81 Mg  Chew (Aspirin) .... One Daily 5)  Diclofenac Sodium 75 Mg  Tbec (Diclofenac Sodium) .... One By Mouth Two Times A Day 6)  Cyclobenzaprine  Hcl 10 Mg  Tabs (Cyclobenzaprine Hcl) .... One Three Times A Day As Needed For Muscle Spasm. 7)  Triamcinolone Acetonide 0.1 % Oint (Triamcinolone Acetonide) .... Apply Three Times A Day As Needed. Dispense Qs For One Month 8)  Sarna Sensitive 1 % Lotn (Pramoxine Hcl) .... As Needed 9)  Gnp Miconazole 7 2 % Crea (Miconazole Nitrate) .... Apply Twice Daily For 7 Days  Allergies: 1)  ! Penicillin 2)  Penicillin G Potassium (Penicillin G Potassium)  Social History: Reviewed history from 06/22/2009 and no changes required. 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   Physical Exam  General:  vitals reviewed. Alert, in no acute distress, sometimes slow to respond Head:  normocephalic and atraumatic.   Eyes:  vision grossly intact, PERRL, EOMI.   Mouth:  Poor dentition Lungs:  normal respiratory effort and normal breath sounds.   Heart:  normal rate, blowing systolic ejection murmur (normal per pt.)  2+ peripheral pulses   Abdomen:  soft and non-tender.   Msk:  Right elbow: not swollen, minimal ttp, full rom Right knee: full ROM, good stability. minimally ttp along medial joint line Left knee: full ROM, good stability, minimally ttp along medial joint line Extremities:  no lower extremity edema Psych:  Not anxious appearing.  Flat affect.   Impression & Recommendations:  Problem # 1:  HYPERLIPIDEMIA, MILD (ICD-272.4) Assessment Unchanged Will check lipids today.  Continue Lipitor Her updated medication list for this problem includes:    Lipitor 20 Mg Tabs (Atorvastatin calcium) ..... One by mouth daily  Orders: T-Lipid Profile [phone]) [facility]- Est  Level 4 (09811)  Problem # 2:  ELBOW PAIN, RIGHT  (BJY-782.95) Assessment: New  ? arthritis.  Tylenol / Ibuprofen as needed.  If not improved in 4 weeks would consider x-ray  Orders: [facility]- Est  Level 4 (62130)  Problem # 3:  KNEE PAIN, BILATERAL (ICD-719.46) Assessment: Unchanged  chronic.  Likely OA.  Continue pain control.  If acutely worse may consider x-ray Her updated medication list for this problem includes:    Baby Aspirin 81 Mg Chew (Aspirin) ..... One daily    Diclofenac Sodium 75 Mg Tbec (Diclofenac sodium) ..... One by mouth two times a day    Cyclobenzaprine Hcl 10 Mg Tabs (Cyclobenzaprine hcl) ..... One three times a day as needed for muscle spasm.  Orders: [facility]- Est  Level 4 (86578)  Problem # 4:  TOBACCO DEPENDENCE (ICD-305.1) Assessment: Improved  Down to 1 cigarette/week.  Counseled pt to quit altogether.  Orders: [facility]- Est  Level 4 (99214)  Complete Medication List: 1)  Amitriptyline Hcl 150 Mg Tabs (Amitriptyline hcl) .... One by mouth daily 2)  Lipitor 20 Mg Tabs (Atorvastatin calcium) .... One by mouth daily 3)  Premarin 0.625 Mg/gm Crea (Estrogens, conjugated) .... One gram per vagina daily for 1 week, three times a week for next week, then weekly to twice a weekly thereafter. dispense one tube 4)  Baby Aspirin 81 Mg Chew (Aspirin) .... One daily 5)  Diclofenac Sodium 75 Mg Tbec (Diclofenac sodium) .... One by mouth two times a day 6)  Cyclobenzaprine Hcl 10 Mg Tabs (Cyclobenzaprine hcl) .... One three times a day as needed for muscle spasm. 7)  Triamcinolone Acetonide 0.1 % Oint (Triamcinolone acetonide) .... Apply three times a day as needed. dispense qs for one month 8)  Sarna Sensitive 1 % Lotn (Pramoxine hcl) .... As needed 9)  Gnp Miconazole 7 2 % Crea (Miconazole nitrate) .... Apply twice daily for 7 days  Other Orders: Mammogram (Screening) (Mammo)  Patient Instructions: 1)  You are doing great 2)  We will check your cholesterol today 3)  You probably have arthritis in your knees and right  elbow.  If they become very bothersome to you let me know. 4)  Try and quit smoking all the way 5)  Please make sure to get the mammogram done 6)  Schedule a follow up appt with me in 6 months Prescriptions: TRIAMCINOLONE ACETONIDE 0.1 % OINT (TRIAMCINOLONE ACETONIDE) apply three times a day as needed. dispense QS for one month  #1 x 11   Entered and Authorized by:   Angelena Sole MD   Signed by:   Angelena Sole MD on 01/09/2010   Method used:   Print then Give to Patient   RxID:   [id number] AMITRIPTYLINE HCL 150 MG  TABS (AMITRIPTYLINE HCL) one by mouth daily  #100 x 3  Entered and Authorized by:   Angelena Sole MD   Signed by:   Angelena Sole MD on 01/09/2010   Method used:   Print then Give to Patient   RxID:   [id number]   Prevention & Chronic Care Immunizations   Influenza vaccine: Fluvax 3+  (10/21/2007)   Influenza vaccine due: 10/20/2008    Tetanus booster: 04/16/2005: Done.   Tetanus booster due: 04/17/2015    Pneumococcal vaccine: Not documented  Colorectal Screening   Hemoccult: Done.  (04/16/2006)   Hemoccult due: Not Indicated    Colonoscopy: Results: Normal.   (06/17/2003)  Other Screening   Pap smear: normal  (01/06/2008)   Pap smear due: 01/05/2009    Mammogram: Done.  (03/17/2006)   Mammogram action/deferral: Ordered  (01/09/2010)   Mammogram due: 03/18/2007   Smoking status: current  (01/09/2010)   Smoking cessation counseling: yes  (06/16/2008)  Lipids   Total Cholesterol: 121  (10/08/2008)   Lipid panel action/deferral: Lipid Panel ordered   LDL: 62  (10/08/2008)   LDL Direct: 113  (06/16/2008)   HDL: 38  (10/08/2008)   Triglycerides: 104  (10/08/2008)    SGOT (AST): 21  (02/10/2009)   SGPT (ALT): 12  (02/10/2009)   Alkaline phosphatase: 73  (02/10/2009)   Total bilirubin: 0.8  (02/10/2009)    Lipid flowsheet reviewed?: Yes   Progress toward LDL goal: Unchanged  Self-Management Support :   Personal Goals (by the  next clinic visit) :      Personal LDL goal: 130  (01/09/2010)    Lipid self-management support: Not documented    Nursing Instructions: Schedule screening mammogram (see order) Give Flu vaccine today
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
## 2011-01-16 NOTE — Letter (Signed)
Summary: Generic Letter  [facility]  [address]   [place], [place] [address]   Phone: [phone]  Fax: [phone]    01/10/2010  Michelle [name] [address] [place], [place]  [address]  Dear Ms. Goehring,   Your cholesterol test looked good.  Here is a copy of your results.  Continue taking the Lipitor as prescribed.  Triglyceride              150 mg/dL                   <557 HDL Cholesterol           51 mg/dL                    >32 Total Chol/HDL Ratio      3.3 Ratio VLDL Cholesterol          30 mg/dL                    2-02 LDL Cholesterol (Calc)    87 mg/dL                    5-42   Sincerely,   Angelena Sole MD  Appended Document: Generic Letter mailed.
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
## 2011-02-23 ENCOUNTER — Ambulatory Visit (INDEPENDENT_AMBULATORY_CARE_PROVIDER_SITE_OTHER): Payer: Self-pay | Admitting: Family Medicine

## 2011-02-23 ENCOUNTER — Encounter: Payer: Self-pay | Admitting: Family Medicine

## 2011-02-23 DIAGNOSIS — Z1211 Encounter for screening for malignant neoplasm of colon: Secondary | ICD-10-CM

## 2011-02-23 DIAGNOSIS — R51 Headache: Secondary | ICD-10-CM

## 2011-02-23 DIAGNOSIS — Z202 Contact with and (suspected) exposure to infections with a predominantly sexual mode of transmission: Secondary | ICD-10-CM

## 2011-02-23 DIAGNOSIS — M62838 Other muscle spasm: Secondary | ICD-10-CM

## 2011-02-23 DIAGNOSIS — M79609 Pain in unspecified limb: Secondary | ICD-10-CM

## 2011-02-23 DIAGNOSIS — R519 Headache, unspecified: Secondary | ICD-10-CM | POA: Insufficient documentation

## 2011-02-23 LAB — CONVERTED CEMR LAB

## 2011-02-23 MED ORDER — MG-PLUS PROTEIN 133 MG PO TABS: 1.0000 | ORAL_TABLET | Freq: Two times a day (BID) | ORAL | Status: AC

## 2011-02-23 NOTE — Assessment & Plan Note (Signed)
More like muscle cramps.  Trial of Magnesium

## 2011-02-23 NOTE — Assessment & Plan Note (Signed)
Improved.  Continue pumice stone and supportive shoes

## 2011-02-23 NOTE — Assessment & Plan Note (Signed)
Check HIV/RPR.  Future order of Urine GC/Chlamydia.
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
## 2011-02-23 NOTE — Assessment & Plan Note (Signed)
No signs of serious intracranial pathology.  Trial of magnesium.

## 2011-02-23 NOTE — Patient Instructions (Signed)
I think that your feet look good.  Continue using the Pumice stone. I want you to start taking magnesium twice a day for the muscle cramps and headaches We will keep an eye on your blood pressure We will let you know of those lab results

## 2011-02-28 ENCOUNTER — Other Ambulatory Visit: Payer: Self-pay | Admitting: Family Medicine

## 2011-02-28 ENCOUNTER — Other Ambulatory Visit: Payer: Self-pay | Admitting: *Deleted

## 2011-03-01 LAB — CONVERTED CEMR LAB
Chlamydia, Swab/Urine, PCR: NEGATIVE
GC Probe Amp, Urine: NEGATIVE

## 2011-03-01 LAB — GC/CHLAMYDIA PROBE AMP, URINE: Chlamydia, Swab/Urine, PCR: NEGATIVE

## 2011-03-07 ENCOUNTER — Telehealth: Payer: Self-pay | Admitting: Family Medicine

## 2011-03-07 NOTE — Telephone Encounter (Signed)
Pt asking to speak with RN about the vitamin that  was recommended she take, is having a hard time finding it.

## 2011-03-07 NOTE — Telephone Encounter (Signed)
Suggested checking at a [facility] store or another vitamin store like.  She will give [place] a call back if she is still having a problem. [place], Maryjo Rochester

## 2011-04-16 ENCOUNTER — Telehealth: Payer: Self-pay | Admitting: Family Medicine

## 2011-04-16 NOTE — Telephone Encounter (Signed)
Pt asking for test results from last visit. °

## 2011-04-17 ENCOUNTER — Encounter: Payer: Self-pay | Admitting: Family Medicine

## 2011-04-17 NOTE — Telephone Encounter (Signed)
Discussed with patient her negative lab results.  She had no questions or concerns about the lab results.  She did say that she wasn't feeling great today and that she had a fast heart rate.  I advised her that if she wasn't feeling well that she should come in to the office for an appointment.  She refused that but said that if she felt worse then she would come in.

## 2011-05-04 NOTE — Group Therapy Note (Signed)
   NAME:  Michelle [name], Michelle [name] NO.:  [id number]   MEDICAL RECORD NO.:  [id number]                   PATIENT TYPE:  OUT   LOCATION:  [facility]                           FACILITY:  [facility]   PHYSICIAN:  Argentina Donovan, MD                     DATE OF BIRTH:  Jul 29, 1951   DATE OF SERVICE:                                    CLINIC NOTE   HISTORY OF PRESENT ILLNESS:  This patient was sent to [place] from [facility]  because of postmenopausal bleeding.  She is 60 years old, white female, two  years postmenopausal who has had two days of spotting and was referred.  They did get an ultrasound which showed a normal size uterus with an  endometrial stripe that was not well defined with approximately 5-6 mm  thickness.  Endometrial biopsy was done without incident.  Various little  tissue was obtained.  We will call the patient with the results.   IMPRESSION:  Postmenopausal bleeding.                                               Argentina Donovan, MD    PR/MEDQ  D:  08/17/2003  T:  08/17/2003  Job:  [id number]
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
## 2011-05-07 LAB — HEMOCCULT GUIAC POC 1CARD (OFFICE)
Card #2 Fecal Occult Blod, POC: NEGATIVE
Fecal Occult Blood, POC: NEGATIVE

## 2011-05-07 NOTE — Progress Notes (Signed)
Addended by: [place], Rockland Kotarski on: 05/07/2011 05:32 PM   Modules accepted: Orders

## 2011-05-21 ENCOUNTER — Encounter: Payer: Self-pay | Admitting: Family Medicine

## 2011-05-21 ENCOUNTER — Ambulatory Visit (INDEPENDENT_AMBULATORY_CARE_PROVIDER_SITE_OTHER): Payer: Self-pay | Admitting: Family Medicine

## 2011-05-21 VITALS — BP 106/71 | HR 79 | Temp 98.9°F | Wt 119.0 lb

## 2011-05-21 DIAGNOSIS — M546 Pain in thoracic spine: Secondary | ICD-10-CM

## 2011-05-21 DIAGNOSIS — R6889 Other general symptoms and signs: Secondary | ICD-10-CM

## 2011-05-21 DIAGNOSIS — N342 Other urethritis: Secondary | ICD-10-CM

## 2011-05-21 DIAGNOSIS — R300 Dysuria: Secondary | ICD-10-CM

## 2011-05-21 LAB — POCT URINALYSIS DIPSTICK
Bilirubin, UA: NEGATIVE
Glucose, UA: NEGATIVE
Ketones, UA: NEGATIVE
Nitrite, UA: NEGATIVE
Spec Grav, UA: 1.02
pH, UA: 6

## 2011-05-21 LAB — POCT UA - MICROSCOPIC ONLY

## 2011-05-21 MED ORDER — SULFAMETHOXAZOLE-TRIMETHOPRIM 800-160 MG PO TABS: 1.0000 | ORAL_TABLET | Freq: Two times a day (BID) | ORAL | Status: AC

## 2011-05-21 NOTE — Progress Notes (Signed)
  Subjective:    Patient ID: Michelle [name], female    DOB: April 07, 1951, 60 y.o.   MRN: [id number]  Urinary Tract Infection  This is a new problem. The current episode started 1 to 4 weeks ago. The problem occurs every urination. The problem has been unchanged. Quality: "pain. The pain is at a severity of 10/10. There has been no fever. She is sexually active. There is no history of pyelonephritis. Associated symptoms include chills, hematuria and urgency. Pertinent negatives include no discharge, flank pain, frequency, hesitancy, nausea, sweats or vomiting. She has tried acetaminophen for the symptoms. The treatment provided moderate relief. Her past medical history is significant for recurrent UTIs.   2. Feels cold:  She has had these feeling for about 3 months  3. Back pain:  Located in the middle of her back.  She doesn't remember any injury.  Pain rated a 2/10.   Review of Systems  Constitutional: Positive for chills.  Gastrointestinal: Negative for nausea and vomiting.  Genitourinary: Positive for urgency and hematuria. Negative for hesitancy, frequency and flank pain.       Objective:   Physical Exam  Constitutional: She is oriented to person, place, and time. She appears well-developed and well-nourished. No distress.  Eyes: Conjunctivae are normal. Pupils are equal, round, and reactive to light. No scleral icterus.  Neck: Normal range of motion. Neck supple. No thyromegaly present.  Cardiovascular: Normal rate and regular rhythm.   Pulmonary/Chest: Effort normal and breath sounds normal. No respiratory distress.  Abdominal: Soft. Bowel sounds are normal.  Musculoskeletal: She exhibits no edema.       TTP along paraspinal muscles in the thoracic region  Neurological: She is alert and oriented to person, place, and time. No cranial nerve deficit. She exhibits normal muscle tone. Coordination normal.  Skin: Skin is warm and dry.       Darier's disease well controlled           Assessment & Plan:

## 2011-05-21 NOTE — Assessment & Plan Note (Signed)
Endorses some symptoms c/w hypothyroidism.  Will check TSH

## 2011-05-21 NOTE — Patient Instructions (Signed)
Urethritis, Adult Urethritis is an inflammation (soreness) of the urethra (the tube exiting from the bladder). It is often caused by germs that may be spread through sexual contact. TREATMENT Urethritis will usually respond to antibiotics. These are medications that kill germs. Take all the medicine given to you. You may feel better in a couple days, but TAKE ALL MEDICINE or the infection may not be completely cured and may become more difficult to treat. Response can generally be expected in 7 to 10 days. You may require additional treatment after more testing. IT IS VERY IMPORTANT THAT YOU  Not have sex until the test results are known and treatment is completed.   Know that you may be asked to notify your sex partner when your final test results are back.   Finish all medications as prescribed.   Prevent sexually transmitted infections including AIDS. Practice safe sex. Use condoms.  SEEK MEDICAL CARE IF:  Your symptoms are not improved in 2 to 3 days.   Your symptoms are getting worse.   Your develop abdominal (belly) pain.   You develop joint pain.   You have an oral temperature above 102 F (38.9 C).  SEEK IMMEDIATE MEDICAL CARE IF:  You have an oral temperature above 102 F (38.9 C), not controlled by medicine.   You develop severe pain in the belly, back or side.   You develop repeated vomiting.  TEST RESULTS Not all test results are available during your visit. If your test results are not back during the visit, make an appointment with your caregiver to find out the results. Do not assume everything is normal if you have not heard from your caregiver or the medical facility. It is important for you to follow-up on all of your test results. Document Released: 05/29/2001 Document Re-Released: 12/25/2009 [facility] Patient Information 2011 [place], [place].

## 2011-05-21 NOTE — Assessment & Plan Note (Signed)
+   UA and identifiable irritation on exam.  Will treat with Bactrim since she has a PCN allergy.

## 2011-05-21 NOTE — Assessment & Plan Note (Signed)
Non-specific thoracic back pain.  No signs of flank pain or other red flags.  Advised Tylenol as needed

## 2011-06-07 ENCOUNTER — Telehealth: Payer: Self-pay | Admitting: Family Medicine

## 2011-06-07 NOTE — Telephone Encounter (Signed)
Need results of blood work taken 6/4

## 2011-06-07 NOTE — Telephone Encounter (Signed)
To PCP

## 2011-06-11 ENCOUNTER — Telehealth: Payer: Self-pay | Admitting: Family Medicine

## 2011-06-11 NOTE — Telephone Encounter (Signed)
Patient informed of normal TSH, expressed understanding.

## 2011-06-11 NOTE — Telephone Encounter (Signed)
Pt asking for recent lab results

## 2011-07-01 ENCOUNTER — Inpatient Hospital Stay (INDEPENDENT_AMBULATORY_CARE_PROVIDER_SITE_OTHER)
Admission: RE | Admit: 2011-07-01 | Discharge: 2011-07-01 | Disposition: A | Discharge status: Home / Self Care | Payer: Self-pay | Source: Ambulatory Visit | Attending: Emergency Medicine | Admitting: Emergency Medicine

## 2011-07-01 ENCOUNTER — Encounter (HOSPITAL_COMMUNITY): Payer: Self-pay

## 2011-07-01 ENCOUNTER — Ambulatory Visit (INDEPENDENT_AMBULATORY_CARE_PROVIDER_SITE_OTHER): Payer: Self-pay

## 2011-07-01 DIAGNOSIS — S20219A Contusion of unspecified front wall of thorax, initial encounter: Secondary | ICD-10-CM

## 2011-07-01 DIAGNOSIS — S8000XA Contusion of unspecified knee, initial encounter: Secondary | ICD-10-CM

## 2011-07-06 ENCOUNTER — Other Ambulatory Visit: Payer: Self-pay | Admitting: Family Medicine

## 2011-07-06 MED ORDER — TRIAMCINOLONE ACETONIDE 0.1 % EX CREA: TOPICAL_CREAM | Freq: Two times a day (BID) | CUTANEOUS | Status: DC

## 2011-07-27 ENCOUNTER — Encounter: Payer: Self-pay | Admitting: Family Medicine

## 2011-07-27 ENCOUNTER — Ambulatory Visit (INDEPENDENT_AMBULATORY_CARE_PROVIDER_SITE_OTHER): Payer: Self-pay | Admitting: Family Medicine

## 2011-07-27 VITALS — BP 135/74 | HR 80 | Temp 97.8°F | Wt 119.0 lb

## 2011-07-27 DIAGNOSIS — N342 Other urethritis: Secondary | ICD-10-CM

## 2011-07-27 DIAGNOSIS — R0789 Other chest pain: Secondary | ICD-10-CM

## 2011-07-27 DIAGNOSIS — R071 Chest pain on breathing: Secondary | ICD-10-CM

## 2011-07-27 DIAGNOSIS — R300 Dysuria: Secondary | ICD-10-CM

## 2011-07-27 LAB — POCT URINALYSIS DIPSTICK
Bilirubin, UA: NEGATIVE
Ketones, UA: NEGATIVE
Protein, UA: NEGATIVE
Spec Grav, UA: 1.01

## 2011-07-27 LAB — POCT UA - MICROSCOPIC ONLY

## 2011-07-27 NOTE — Progress Notes (Signed)
  Subjective:    Patient ID: Michelle [name], female    DOB: 04-21-1951, 60 y.o.   MRN: [id number]  HPI Pt here to meet new MD and f/u fall at home and recent urethritis.  Fall- pt had a fall at home on 06/21/11. She hit the L side of her chest. She was evaluated at urgent care on the day of the fall and had a CXR and EKG which were normal. She was diagnosed with contusion and has been taking Vicodin prn pain. She is still having intermittent  L sided chest wall pain. She describes the pain as sharp. She has a "knot' in the area of pain. She is still smoking 1-3/4 ppd. She denies cough, sob, n/v/ diaphoresis.   Urethritis- pt was treated by Dr. Minna Antis for UTI with Bactrim last June. She denies dysuria currently. She admits to occassional soreness with urination.   Review of Systems As per HPI    Objective:   Physical Exam  Constitutional: She appears well-developed and well-nourished. No distress.  Cardiovascular: Regular rhythm.  Exam reveals no gallop and no friction rub.   Murmur heard. Pulmonary/Chest: Effort normal and breath sounds normal. Chest wall is not dull to percussion. She exhibits tenderness and bony tenderness. She exhibits no mass, no laceration, no crepitus, no edema, no deformity, no swelling and no retraction.            Assessment & Plan:

## 2011-07-27 NOTE — Patient Instructions (Signed)
Thank you for coming in today,  I will send a script for antibiotics if your urine is suggestive of UTI. For your continued chest wall pain take tylenol or motrin prior to working.  -Dr. Armen Pickup

## 2011-07-31 DIAGNOSIS — R0789 Other chest pain: Secondary | ICD-10-CM | POA: Insufficient documentation

## 2011-07-31 NOTE — Assessment & Plan Note (Signed)
S/p fall. No evidence of fracture on physical exam. Pain not suggestive of cardiac chest pain.  Plan: pt may continue vicodin prn and tylenol prn pain.

## 2011-07-31 NOTE — Assessment & Plan Note (Signed)
Imp: improved. Pt has completed course of antibiotics. F/u UA wnl. Non-suggestive of urethritis/UTI.  Plan: no medication needed.

## 2012-02-08 ENCOUNTER — Encounter: Payer: Self-pay | Admitting: Family Medicine

## 2012-02-08 ENCOUNTER — Ambulatory Visit (INDEPENDENT_AMBULATORY_CARE_PROVIDER_SITE_OTHER): Payer: Self-pay | Admitting: Family Medicine

## 2012-02-08 ENCOUNTER — Telehealth: Payer: Self-pay | Admitting: Family Medicine

## 2012-02-08 DIAGNOSIS — M62838 Other muscle spasm: Secondary | ICD-10-CM

## 2012-02-08 DIAGNOSIS — K529 Noninfective gastroenteritis and colitis, unspecified: Secondary | ICD-10-CM

## 2012-02-08 DIAGNOSIS — K5289 Other specified noninfective gastroenteritis and colitis: Secondary | ICD-10-CM

## 2012-02-08 MED ORDER — ONDANSETRON HCL 4 MG PO TABS: 4.0000 mg | ORAL_TABLET | Freq: Three times a day (TID) | ORAL | Status: AC | PRN

## 2012-02-08 MED ORDER — CYCLOBENZAPRINE HCL 10 MG PO TABS: 10.0000 mg | ORAL_TABLET | Freq: Three times a day (TID) | ORAL | Status: AC | PRN

## 2012-02-08 NOTE — Telephone Encounter (Signed)
Dr. Ashley Royalty states there is nothing else to give that will not interfere with her meds. Patient notified.

## 2012-02-08 NOTE — Patient Instructions (Signed)
Thank you for coming in today, it was good to see you The 'BRAT' (Bananas, rice, applesauce, toast) or bland diet is suggested, then progress to diet as tolerated as symptoms abate. Call if bloody stools, persistent diarrhea, vomiting, fever or abdominal pain. I have sent over a prescription to the health dept. for flexaril for your back spasm

## 2012-02-08 NOTE — Telephone Encounter (Signed)
States that the Zofran is too expensive and needs something cheaper.  Health Dept

## 2012-02-12 NOTE — Progress Notes (Signed)
  Subjective:    Patient ID: Michelle [name], female    DOB: [birth year], 61 y.o.   MRN: [id number]  HPI 1.Vomiting and diarrhea:  C/o nausea, vomiting, diarrhea x2 days.  Describes stool as watery without blood.  Vomit without blood.  Denies fever.  Trying to drink water to stay hydrated. No raw foods or change in diet recently   2. Muscle spasm:  C/o muscle spasm in mid back, thinks this may be from wretching from vomiting.  Pain does not radiate, feels like muscle spasm she has had before.   Review of Systems     Objective:   Physical Exam  Constitutional: She is oriented to person, place, and time. She appears well-nourished.       Well hydrated, nad   HENT:  Head: Normocephalic and atraumatic.  Neck: Neck supple.  Cardiovascular: Normal rate, regular rhythm and normal heart sounds.   Pulmonary/Chest: Effort normal and breath sounds normal.  Abdominal: She exhibits no distension.       Mild epigastric tenderness, BS normal  Musculoskeletal:       Spasm in mid thoracic para spinal musculature on R side, mild TTP.  Neurological: She is alert and oriented to person, place, and time.          Assessment & Plan:

## 2012-02-12 NOTE — Assessment & Plan Note (Signed)
Will treat acutely with flexeril.  Advised this may cause drowsiness, partner will drive her around while on this med.

## 2012-02-12 NOTE — Assessment & Plan Note (Signed)
Encouraged to continue supportive treatment, push fluids.  Given rx for zofran to help with nausea or vomiting, may be cost prohibitive though, told her to check with different pharmacies.  Advised that i did not want to do promethazine with her being on flexeril and TCAas this would only increase sedative effect of these medications.

## 2012-02-25 ENCOUNTER — Ambulatory Visit: Payer: Self-pay | Admitting: Family Medicine

## 2012-02-27 ENCOUNTER — Ambulatory Visit (INDEPENDENT_AMBULATORY_CARE_PROVIDER_SITE_OTHER): Payer: Self-pay | Admitting: Family Medicine

## 2012-02-27 ENCOUNTER — Encounter: Payer: Self-pay | Admitting: Family Medicine

## 2012-02-27 VITALS — BP 130/84 | HR 52 | Temp 98.1°F | Ht 62.0 in | Wt 130.0 lb

## 2012-02-27 DIAGNOSIS — Z0000 Encounter for general adult medical examination without abnormal findings: Secondary | ICD-10-CM

## 2012-02-27 DIAGNOSIS — Q828 Other specified congenital malformations of skin: Secondary | ICD-10-CM

## 2012-02-27 DIAGNOSIS — E785 Hyperlipidemia, unspecified: Secondary | ICD-10-CM

## 2012-02-27 DIAGNOSIS — Z87891 Personal history of nicotine dependence: Secondary | ICD-10-CM

## 2012-02-27 MED ORDER — ZOSTER VACCINE LIVE 19400 UNT/0.65ML ~~LOC~~ SOLR: 0.6500 mL | Freq: Once | SUBCUTANEOUS | Status: AC

## 2012-02-27 NOTE — Assessment & Plan Note (Signed)
Quit 02/12/12.

## 2012-02-27 NOTE — Assessment & Plan Note (Signed)
A: update health maintenance. Will need to clarify colonoscopy status.  P: zostavax. Repeat pap due in 08/2013. Repeat due 07/2012.

## 2012-02-27 NOTE — Progress Notes (Signed)
Subjective:     Patient ID: Michelle [name], female   DOB: 1951-05-01, 61 y.o.   MRN: [id number]  HPI 61 year old female presents with her partner for followup of gastroenteritis and back spasm and to discuss the following:  1. Gastroenteritis: resolved. No fever, N/V/D.  2. Back spasm: back pain persist intermittently depending on how active she is. No pain now.  3. Smoking: quit 12/12/11. Has smoked 2 cigarettes since quit date. No coughing, CP or SOB.  4. Hyperlipidemia: compliant with fish oil and Lipitor. Compliant with healthy diet. Patient does not exercise regularly. She is conscientious of maintaining a normal weight.  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 Skin is doing well per patient with very few active areas. Intermittently pruritic.  6. Health maintenance: updated HM. Unclear if patient had colonoscopy. She describes a procedure done in the office at [facility].  Review of Systems As per HPI     Objective:   Physical Exam BP 130/84  Pulse 52  Temp(Src) 98.1 F (36.7 C) (Oral)  Ht 5\' 2"  (1.575 m)  Wt 130 lb (58.968 kg)  BMI 23.78 kg/m2 General appearance: alert, cooperative and no distress Neck: no adenopathy, no carotid bruit, no JVD, supple, symmetrical, trachea midline and thyroid not enlarged, symmetric, no tenderness/mass/nodules Lungs: clear to auscultation bilaterally Heart: regular rate and rhythm, S1, S2 normal, no murmur, click, rub or gallop Skin: scaly erythematous papules. evidence of excoriation. Multiple hemagiomas. Few small benign nevi.    Assessment:         Plan:

## 2012-02-27 NOTE — Assessment & Plan Note (Signed)
A: compliant with meds. FLP and CMET ordered. P: continue current mgmt and medication adjustment based on blood work. Encourage 30 mins of exercise daily.

## 2012-02-27 NOTE — Assessment & Plan Note (Signed)
A: stable and well controlled.  P: continue sarna and kenalog cream.

## 2012-02-27 NOTE — Patient Instructions (Signed)
Ms. Hypes,  Thank you for coming to see me today. Congratulations on your smoking cessation!  Please get your zostavx.   Please come at your earliest convenience for your blood work.  Dr. Armen Pickup

## 2012-03-06 ENCOUNTER — Other Ambulatory Visit: Payer: Self-pay

## 2012-03-06 DIAGNOSIS — E785 Hyperlipidemia, unspecified: Secondary | ICD-10-CM

## 2012-03-06 NOTE — Progress Notes (Signed)
cmp and flp done today Michelle [name] 

## 2012-03-07 LAB — COMPREHENSIVE METABOLIC PANEL
Albumin: 4.5 g/dL (ref 3.5–5.2)
BUN: 16 mg/dL (ref 6–23)
CO2: 31 mEq/L (ref 19–32)
Calcium: 8.7 mg/dL (ref 8.4–10.5)
Chloride: 104 mEq/L (ref 96–112)
Creat: 1.09 mg/dL (ref 0.50–1.10)
Glucose, Bld: 81 mg/dL (ref 70–99)

## 2012-03-07 LAB — LIPID PANEL
Cholesterol: 155 mg/dL (ref 0–200)
Triglycerides: 80 mg/dL (ref <150)
VLDL: 16 mg/dL (ref 0–40)

## 2012-03-10 ENCOUNTER — Telehealth: Payer: Self-pay | Admitting: *Deleted

## 2012-03-10 NOTE — Telephone Encounter (Signed)
Patient signed a realease of information from last week and wants a copy of her most recent labs. Patient notified that labs are ready to pick up.  Labs are on Lynn's desk.

## 2012-03-13 ENCOUNTER — Telehealth: Payer: Self-pay | Admitting: Family Medicine

## 2012-03-13 ENCOUNTER — Encounter: Payer: Self-pay | Admitting: Family Medicine

## 2012-03-13 NOTE — Telephone Encounter (Signed)
Called patient at home to discuss lab work. Informed her that cholesterol is normal. We will have a trial of stopping lipitor for 6 months and recheck the LDL. Called [facility]. Left VM to update change in medications.   She reported that R elbow is still sore and warm. No swelling. She is taking tylenol prn pain. Advised adding motrin/ibuprofen or topical capsaicin prn pain for next 5 days.

## 2012-06-16 ENCOUNTER — Other Ambulatory Visit: Payer: Self-pay | Admitting: Family Medicine

## 2012-06-16 MED ORDER — AMITRIPTYLINE HCL 50 MG PO TABS: ORAL_TABLET | ORAL | Status: DC

## 2012-06-16 NOTE — Telephone Encounter (Signed)
Called patient to clarify elavil dose for refill request.

## 2012-06-17 ENCOUNTER — Telehealth: Payer: Self-pay | Admitting: *Deleted

## 2012-06-17 NOTE — Telephone Encounter (Signed)
Dawn at [facility] calling to verify strength/dose of Elavil Rx that was written and faxed yesterday.  Rx written for Elavil 50mg ---take 50-75mg  QHS (2-3 tabs QHS).  Pharmacy calling to check if this is the correct strength and to clarify the dosage.  Will route to Dr. Armen Pickup and call pharmacy back.  Gaylene Brooks, RN

## 2012-06-17 NOTE — Telephone Encounter (Signed)
Called to clarify dose. The dose and instructions were correct as written.

## 2012-08-21 ENCOUNTER — Ambulatory Visit (INDEPENDENT_AMBULATORY_CARE_PROVIDER_SITE_OTHER): Payer: No Typology Code available for payment source | Admitting: Family Medicine

## 2012-08-21 ENCOUNTER — Encounter: Payer: Self-pay | Admitting: Family Medicine

## 2012-08-21 VITALS — BP 137/83 | HR 80 | Temp 97.7°F | Ht 62.0 in | Wt 131.0 lb

## 2012-08-21 DIAGNOSIS — F17200 Nicotine dependence, unspecified, uncomplicated: Secondary | ICD-10-CM

## 2012-08-21 DIAGNOSIS — F329 Major depressive disorder, single episode, unspecified: Secondary | ICD-10-CM

## 2012-08-21 DIAGNOSIS — Z1211 Encounter for screening for malignant neoplasm of colon: Secondary | ICD-10-CM

## 2012-08-21 DIAGNOSIS — E785 Hyperlipidemia, unspecified: Secondary | ICD-10-CM

## 2012-08-21 DIAGNOSIS — Z0000 Encounter for general adult medical examination without abnormal findings: Secondary | ICD-10-CM

## 2012-08-21 MED ORDER — AMITRIPTYLINE HCL 50 MG PO TABS: 50.0000 mg | ORAL_TABLET | Freq: Every day | ORAL | Status: DC

## 2012-08-21 MED ORDER — SERTRALINE HCL 50 MG PO TABS: 50.0000 mg | ORAL_TABLET | Freq: Every day | ORAL | Status: DC
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
## 2012-08-21 NOTE — Patient Instructions (Addendum)
Ms. Runquist,  Thank you for coming in today.  1. Please schedule and go for your mammogram. 2. Please complete stool cards and return.   For anxiety 1. Decrease elavil to 50 mg nightly  2. Star zoloft 50mg  daily.   F/u in 3 weeks.   Dr. Armen Pickup

## 2012-08-23 ENCOUNTER — Encounter: Payer: Self-pay | Admitting: Family Medicine

## 2012-08-23 NOTE — Assessment & Plan Note (Signed)
A: Unclear if primarily depression or anxiety as sources of mood symptoms.  P:  Decrease elavil Add zoloft F/u GAD 7 and PHQ 9.

## 2012-08-23 NOTE — Assessment & Plan Note (Signed)
A: due for mammogram and colon CA screening.  P: Self referral form for mammogram patient has [facility]. Stool cards provided for colon CA screening. Zostavax deferred due to cost.

## 2012-08-23 NOTE — Assessment & Plan Note (Signed)
A: declined. Restarted due to stress. Now with occasional hoarse voice. No B symptoms.  P:  Recommend smoking cessation. Treat stress/anxieth with more regular exercise and SSRI.

## 2012-08-23 NOTE — Assessment & Plan Note (Signed)
A: stopped statin due to excellent lipid profile and low CAD risk at hte time. P: check FLP at f/u visit in few weeks. May restart statin if needed.

## 2012-08-27 ENCOUNTER — Other Ambulatory Visit: Payer: Self-pay | Admitting: Family Medicine

## 2012-08-27 MED ORDER — AMITRIPTYLINE HCL 50 MG PO TABS: 50.0000 mg | ORAL_TABLET | Freq: Every day | ORAL | Status: DC

## 2012-08-27 NOTE — Telephone Encounter (Signed)
Left VM clarifying elavil change. Patient decreased to 1 tab nightly. Given 30 with 1 refill. This is correct.

## 2012-09-12 ENCOUNTER — Ambulatory Visit: Payer: Self-pay | Admitting: Family Medicine

## 2012-09-23 ENCOUNTER — Ambulatory Visit (INDEPENDENT_AMBULATORY_CARE_PROVIDER_SITE_OTHER): Payer: Self-pay | Admitting: Family Medicine

## 2012-09-23 ENCOUNTER — Encounter: Payer: Self-pay | Admitting: Family Medicine

## 2012-09-23 VITALS — BP 121/72 | HR 66 | Temp 97.9°F | Ht 63.0 in | Wt 129.0 lb

## 2012-09-23 DIAGNOSIS — Z202 Contact with and (suspected) exposure to infections with a predominantly sexual mode of transmission: Secondary | ICD-10-CM | POA: Insufficient documentation

## 2012-09-23 DIAGNOSIS — F411 Generalized anxiety disorder: Secondary | ICD-10-CM

## 2012-09-23 DIAGNOSIS — Q828 Other specified congenital malformations of skin: Secondary | ICD-10-CM

## 2012-09-23 DIAGNOSIS — E785 Hyperlipidemia, unspecified: Secondary | ICD-10-CM

## 2012-09-23 DIAGNOSIS — F419 Anxiety disorder, unspecified: Secondary | ICD-10-CM

## 2012-09-23 DIAGNOSIS — M79609 Pain in unspecified limb: Secondary | ICD-10-CM

## 2012-09-23 DIAGNOSIS — F329 Major depressive disorder, single episode, unspecified: Secondary | ICD-10-CM

## 2012-09-23 DIAGNOSIS — F17200 Nicotine dependence, unspecified, uncomplicated: Secondary | ICD-10-CM

## 2012-09-23 DIAGNOSIS — Z9189 Other specified personal risk factors, not elsewhere classified: Secondary | ICD-10-CM

## 2012-09-23 LAB — LIPID PANEL
LDL Cholesterol: 112 mg/dL — ABNORMAL HIGH (ref 0–99)
VLDL: 11 mg/dL (ref 0–40)

## 2012-09-23 NOTE — Assessment & Plan Note (Signed)
Check HIV and RPR 

## 2012-09-23 NOTE — Assessment & Plan Note (Signed)
Stress from high arches and active work. Plan ice, massage, supportive shoes.

## 2012-09-23 NOTE — Assessment & Plan Note (Signed)
A: no SI or HI. Gainfully employed.  P: Continue elavil and zoloft.

## 2012-09-23 NOTE — Progress Notes (Signed)
Subjective:     Patient ID: Michelle [name], female   DOB: [birth year], 61 y.o.   MRN: [id number]  HPI 61 yo F presents for f/u  1. Mood disorder: has depression and anxiety. Taking elavil 50 mg nightly. Started on zoloft due to elavil causing excessive daytime sleepiness. Reports improvement in daytime sleepiness. Denies suicidal ideation and homicidal ideation.   2. ?STD exposure: long term boyfriend but unsure of fidelity request STD testing. No lesions or discharge.   3. R ball of foot pain: x 2 weeks. After long day of work. Denies trauma. Wears shoes with supportive inserts.  Review of Systems As per HPI     Objective:   Physical Exam BP 121/72  Pulse 66  Temp 97.9 F (36.6 C) (Oral)  Ht 5\' 3"  (1.6 m)  Wt 129 lb (58.514 kg)  BMI 22.85 kg/m2 General appearance: alert, cooperative and no distress Extremities: extremities normal, atraumatic, no cyanosis or edema, No trauma of R foot pads, high arch.  GAD-7: score of 15, 0 to question 7, 1 to question 5. 2 to question 3 and 6. 3 to question 1,2 and 4. very difficult. PHQ-9: score of 16. 0 to 5 and 9. 1 to 1 and 3. 2 to 7. 3 to 2, 4 and 8. Extremely difficult to work. Somewhat difficult to take care of things at home and get along with other people.      Assessment and Plan:

## 2012-09-23 NOTE — Assessment & Plan Note (Signed)
A: moderate anxiety. No panic attacks.  P: continue zoloft.

## 2012-09-23 NOTE — Patient Instructions (Addendum)
Michelle [name],  Thank you for coming in today. Please continue to take one elavil at night and the zoloft.  Please see me in 3 mos for anxiety follow up.   For R ball of foot pain: 1. Continue to wear supportive shoes with inserts 2. Ice foot at night 3. Massage feet   Dr. Armen Pickup

## 2012-09-24 ENCOUNTER — Encounter: Payer: Self-pay | Admitting: Family Medicine

## 2012-09-24 LAB — RPR

## 2012-11-19 ENCOUNTER — Other Ambulatory Visit: Payer: Self-pay | Admitting: Family Medicine

## 2012-11-20 ENCOUNTER — Other Ambulatory Visit: Payer: Self-pay | Admitting: Family Medicine

## 2012-11-20 DIAGNOSIS — Z1231 Encounter for screening mammogram for malignant neoplasm of breast: Secondary | ICD-10-CM

## 2012-12-02 ENCOUNTER — Telehealth: Payer: Self-pay | Admitting: Family Medicine

## 2012-12-02 ENCOUNTER — Ambulatory Visit (INDEPENDENT_AMBULATORY_CARE_PROVIDER_SITE_OTHER): Payer: No Typology Code available for payment source | Admitting: Family Medicine

## 2012-12-02 ENCOUNTER — Encounter: Payer: Self-pay | Admitting: Family Medicine

## 2012-12-02 VITALS — BP 159/92 | HR 74 | Ht 62.0 in | Wt 124.0 lb

## 2012-12-02 DIAGNOSIS — A499 Bacterial infection, unspecified: Secondary | ICD-10-CM

## 2012-12-02 DIAGNOSIS — B9689 Other specified bacterial agents as the cause of diseases classified elsewhere: Secondary | ICD-10-CM

## 2012-12-02 DIAGNOSIS — F411 Generalized anxiety disorder: Secondary | ICD-10-CM

## 2012-12-02 DIAGNOSIS — N760 Acute vaginitis: Secondary | ICD-10-CM

## 2012-12-02 DIAGNOSIS — Z0000 Encounter for general adult medical examination without abnormal findings: Secondary | ICD-10-CM

## 2012-12-02 DIAGNOSIS — N898 Other specified noninflammatory disorders of vagina: Secondary | ICD-10-CM

## 2012-12-02 DIAGNOSIS — M6289 Other specified disorders of muscle: Secondary | ICD-10-CM | POA: Insufficient documentation

## 2012-12-02 DIAGNOSIS — M629 Disorder of muscle, unspecified: Secondary | ICD-10-CM

## 2012-12-02 DIAGNOSIS — Z23 Encounter for immunization: Secondary | ICD-10-CM

## 2012-12-02 DIAGNOSIS — F419 Anxiety disorder, unspecified: Secondary | ICD-10-CM

## 2012-12-02 LAB — POCT WET PREP (WET MOUNT)

## 2012-12-02 MED ORDER — METRONIDAZOLE 500 MG PO TABS: 500.0000 mg | ORAL_TABLET | Freq: Two times a day (BID) | ORAL | Status: DC

## 2012-12-02 NOTE — Patient Instructions (Addendum)
Michelle [name],  Thank you for coming in today.   1. For muscle cramps: You do have weakness on exam. Please do the following exercises the help strengthen your hamstrings.  1. Side leg lifts start with 30 on each side, twice daily.  2. Back legs swings start with 30 twice daily. 3. Exercises for one month.  For anxiety: Restart zoloft.   For vaginal irritation: Exam normal. I will call with wet prep results.  rewash your clothing especially underwear.  Regarding urinary frequency make another visit to assess this if the problem continues.   Dr. Armen Pickup

## 2012-12-02 NOTE — Assessment & Plan Note (Signed)
Flu shot today 

## 2012-12-02 NOTE — Assessment & Plan Note (Signed)
For anxiety: Restart zoloft.

## 2012-12-02 NOTE — Telephone Encounter (Signed)
Wet prep positive for BV. Likely source of mild irritation w/o rash. Flagyl sent to pharmacy take one tab twice daily for 7 days. Do not mix with alcohol. Take with small meal.

## 2012-12-02 NOTE — Assessment & Plan Note (Signed)
1. For muscle cramps: You do have weakness on exam. Please do the following exercises the help strengthen your hamstrings.  1. Side leg lifts start with 30 on each side, twice daily.  2. Back legs swings start with 30 twice daily. 3. Exercises for one month.

## 2012-12-02 NOTE — Progress Notes (Signed)
Subjective:     Patient ID: Michelle [name], female   DOB: Aug 27, 1951, 61 y.o.   MRN: [id number]  HPI 61 yo F presents for f/u visit to discuss the following:  1. Anxiety: out of zoloft. Admits to anxiety regarding finances and home improvement needs. Denies SI. Working and living with "boyfriend/friend".  2. Vaginal itching: x 4 weeks. No rash. No lesions. Sexually active with one partner. Possible exposure to harsh detergent.   3.  bilateral posterior thigh cramps: occurs at night. Severe. Wakes patient from sleep. No injuries. No fever.   4. Health maintenance: flu shot desired.   Review of Systems As per HPI Bubbly urine and increased urinary frequency. Denies dysuria.  Low back pain.     Objective:   Physical Exam BP 159/92  Pulse 74  Ht 5\' 2"  (1.575 m)  Wt 124 lb (56.246 kg)  BMI 22.68 kg/m2 General appearance: alert, cooperative and no distress Lungs: clear to auscultation bilaterally Heart: regular rate and rhythm S4 gallop heard throughout the precordium Pelvic: cervix normal in appearance, external genitalia normal, no adnexal masses or tenderness, no cervical motion tenderness, positive findings: positive whiff test, saline prep positive for clue cells or vaginal discharge:  white and mucoid, rectovaginal septum normal and uterus normal size, shape, and consistency Extremities: extremities normal, atraumatic, no cyanosis or edema. Hamstring 4+/5 bilateral. Non tender.     Assessment and Plan:

## 2012-12-09 ENCOUNTER — Ambulatory Visit (HOSPITAL_COMMUNITY): Payer: No Typology Code available for payment source

## 2012-12-24 ENCOUNTER — Encounter: Payer: Self-pay | Admitting: Family Medicine

## 2012-12-24 ENCOUNTER — Ambulatory Visit (INDEPENDENT_AMBULATORY_CARE_PROVIDER_SITE_OTHER): Payer: No Typology Code available for payment source | Admitting: Family Medicine

## 2012-12-24 ENCOUNTER — Telehealth: Payer: Self-pay | Admitting: Family Medicine

## 2012-12-24 VITALS — BP 143/71 | HR 72 | Temp 98.3°F | Ht 62.0 in | Wt 122.0 lb

## 2012-12-24 DIAGNOSIS — A499 Bacterial infection, unspecified: Secondary | ICD-10-CM

## 2012-12-24 DIAGNOSIS — B9689 Other specified bacterial agents as the cause of diseases classified elsewhere: Secondary | ICD-10-CM

## 2012-12-24 DIAGNOSIS — R300 Dysuria: Secondary | ICD-10-CM

## 2012-12-24 DIAGNOSIS — N760 Acute vaginitis: Secondary | ICD-10-CM

## 2012-12-24 DIAGNOSIS — R3915 Urgency of urination: Secondary | ICD-10-CM

## 2012-12-24 LAB — POCT UA - MICROSCOPIC ONLY

## 2012-12-24 LAB — POCT URINALYSIS DIPSTICK
Bilirubin, UA: NEGATIVE
Glucose, UA: NEGATIVE
Spec Grav, UA: 1.015

## 2012-12-24 NOTE — Telephone Encounter (Signed)
Called patient.  Left VM. No evidence of UTI Plan: Go to restroom every 3-4 hrs Caffeine 1-2 servings q AM

## 2012-12-24 NOTE — Patient Instructions (Addendum)
Mattalynn,  Thank you for coming in today.   I am still waiting on your urinalysis but it does not sound you are having symptoms of a UTI so you will not need to be treated, either way.   For your L shoulder: continue heat. Exercise shoulder with light weight or resistant bands 3-4 times weekly to maintain strength and range of motion.    Dr. Armen Pickup

## 2012-12-24 NOTE — Assessment & Plan Note (Signed)
A: symptoms and UA do not suggest UTI. ? Overactive bladder P: Improved toilet ing by  -going to restroom regular, every 2-3 hrs -decrease caffeine.

## 2012-12-24 NOTE — Progress Notes (Signed)
Subjective:     Patient ID: Michelle [name], female   DOB: 1951-03-23, 62 y.o.   MRN: [id number]  HPI 62 yo F presents for f/u visit to discuss the following:  1. ? UTI: patient denies dysuria, abdominal pain, flank pain, fever. She admits to urinary frequency and urgency.   2. Vaginal discharge: improved with flagyl for BV. Still has scant discharge. No itching or irritation.   3. L shoulder pain: x 3 days. L posterior shoulder.  Patient was lifting a heavy bag of trash overhead at work, lost her grip and had to catch the bag abruptly.  She denies fall or other injury. She is apply a heating pad to the area with some relief of pain. She reports popping of shoulder.   Review of Systems As per HPI    Objective:   Physical Exam BP 143/71  Pulse 72  Temp 98.3 F (36.8 C) (Oral)  Ht 5\' 2"  (1.575 m)  Wt 122 lb (55.339 kg)  BMI 22.31 kg/m2 General appearance: alert, cooperative and no distress Back: symmetric, no curvature. ROM normal. No CVA tenderness. Abdomen: soft, non-tender; bowel sounds normal; no masses,  no organomegaly Extremities: L posterior shoulder fullness, no deformity, mild tender with arm adduction/overhead flexion. Full ROM. Full grip strength.   UA: trace blood. Small LE. Clue cells.     Assessment and Plan:

## 2012-12-24 NOTE — Assessment & Plan Note (Signed)
A: discharge improved with treatment some residual clue cells noted on UA. P: no further treatment needed.

## 2013-01-08 ENCOUNTER — Ambulatory Visit (HOSPITAL_COMMUNITY)
Admission: RE | Admit: 2013-01-08 | Discharge: 2013-01-08 | Disposition: A | Discharge status: Home / Self Care | Payer: No Typology Code available for payment source | Source: Ambulatory Visit | Attending: Family Medicine | Admitting: Family Medicine

## 2013-01-08 DIAGNOSIS — Z1231 Encounter for screening mammogram for malignant neoplasm of breast: Secondary | ICD-10-CM | POA: Insufficient documentation

## 2013-01-12 ENCOUNTER — Telehealth: Payer: Self-pay | Admitting: Family Medicine

## 2013-01-12 NOTE — Telephone Encounter (Signed)
Called patient Left VM Normal mammogram reports received and reviewed Repeat mammogram in one year.

## 2013-02-23 ENCOUNTER — Telehealth: Payer: Self-pay | Admitting: Family Medicine

## 2013-02-23 NOTE — Telephone Encounter (Signed)
Patient is calling because she needs a new Rx for Amitriptyline sent to [facility] on [address].

## 2013-02-24 ENCOUNTER — Other Ambulatory Visit: Payer: Self-pay | Admitting: Family Medicine

## 2013-02-24 MED ORDER — AMITRIPTYLINE HCL 50 MG PO TABS: 50.0000 mg | ORAL_TABLET | Freq: Every day | ORAL | Status: DC

## 2013-02-24 NOTE — Telephone Encounter (Signed)
Pt informed. Michelle [name], Michelle [name]  

## 2013-02-24 NOTE — Telephone Encounter (Signed)
Refill sent. Please inform patient to call her pharmacy for refill request in the future.

## 2013-04-01 LAB — POC HEMOCCULT BLD/STL (HOME/3-CARD/SCREEN)
Card #2 Fecal Occult Blod, POC: NEGATIVE
Fecal Occult Blood, POC: NEGATIVE

## 2013-04-01 NOTE — Addendum Note (Signed)
Addended by: [place], Lyrique Hakim on: 04/01/2013 05:10 PM   Modules accepted: Orders

## 2013-04-10 ENCOUNTER — Encounter (HOSPITAL_COMMUNITY): Payer: Self-pay | Admitting: Emergency Medicine

## 2013-04-10 ENCOUNTER — Emergency Department (INDEPENDENT_AMBULATORY_CARE_PROVIDER_SITE_OTHER)
Admission: EM | Admit: 2013-04-10 | Discharge: 2013-04-10 | Disposition: A | Discharge status: Home / Self Care | Payer: PRIVATE HEALTH INSURANCE | Source: Home / Self Care | Attending: Family Medicine | Admitting: Family Medicine

## 2013-04-10 DIAGNOSIS — S2341XA Sprain of ribs, initial encounter: Secondary | ICD-10-CM

## 2013-04-10 NOTE — ED Notes (Signed)
Pt c/o lump on upper left chest above left breast that she noticed today. Pt states that she was helping moving a washer on the 14th not sure if that has anything to do with lump. Pt has not taken any meds for treatment;heat or cold therapy.
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
## 2013-04-13 ENCOUNTER — Telehealth: Payer: Self-pay | Admitting: Family Medicine

## 2013-04-13 NOTE — Telephone Encounter (Signed)
Will forward message to pcp

## 2013-04-13 NOTE — Telephone Encounter (Signed)
Patient is requesting a refill on her Zoloft to go to [facility] on [address] instead of [facility].

## 2013-04-14 MED ORDER — SERTRALINE HCL 50 MG PO TABS: ORAL_TABLET | ORAL | Status: DC

## 2013-04-22 ENCOUNTER — Encounter: Payer: Self-pay | Admitting: Family Medicine

## 2013-04-22 ENCOUNTER — Ambulatory Visit (INDEPENDENT_AMBULATORY_CARE_PROVIDER_SITE_OTHER): Payer: PRIVATE HEALTH INSURANCE | Admitting: Family Medicine

## 2013-04-22 VITALS — BP 153/98 | HR 70 | Ht 63.0 in | Wt 118.0 lb

## 2013-04-22 DIAGNOSIS — R0789 Other chest pain: Secondary | ICD-10-CM | POA: Insufficient documentation

## 2013-04-22 DIAGNOSIS — R071 Chest pain on breathing: Secondary | ICD-10-CM

## 2013-04-22 MED ORDER — TRAMADOL HCL 50 MG PO TABS: 50.0000 mg | ORAL_TABLET | Freq: Three times a day (TID) | ORAL | Status: DC | PRN

## 2013-04-22 MED ORDER — DICLOFENAC SODIUM 75 MG PO TBEC: 75.0000 mg | DELAYED_RELEASE_TABLET | Freq: Two times a day (BID) | ORAL | Status: DC

## 2013-04-22 NOTE — Assessment & Plan Note (Signed)
Possible sprain versus just simple pain. Diclofenac for relief. Tramadol for severe pain relief Have written her out of work over this weekend. The next time she goes to work at her regularly scheduled shift on Wednesday. Discussed normal course of healing for this. No evidence of cardiac or pulmonary etiology of pain

## 2013-04-22 NOTE — Patient Instructions (Signed)
Take the Diclofenac twice daily for pain and anti-inflammatory.  When it hurts really bad, take the Tramadol up to every 8 hours.    Keep using heat or ice on it.
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
## 2013-04-29 ENCOUNTER — Ambulatory Visit (INDEPENDENT_AMBULATORY_CARE_PROVIDER_SITE_OTHER): Payer: PRIVATE HEALTH INSURANCE | Admitting: Family Medicine

## 2013-04-29 VITALS — BP 125/75 | HR 70 | Temp 98.3°F | Ht 63.0 in | Wt 122.4 lb

## 2013-04-29 DIAGNOSIS — R0789 Other chest pain: Secondary | ICD-10-CM

## 2013-04-29 DIAGNOSIS — F329 Major depressive disorder, single episode, unspecified: Secondary | ICD-10-CM

## 2013-04-29 DIAGNOSIS — R071 Chest pain on breathing: Secondary | ICD-10-CM

## 2013-04-29 MED ORDER — SERTRALINE HCL 50 MG PO TABS: ORAL_TABLET | ORAL | Status: DC

## 2013-04-29 MED ORDER — DULOXETINE HCL 30 MG PO CPEP: 30.0000 mg | ORAL_CAPSULE | Freq: Every day | ORAL | Status: DC

## 2013-04-29 MED ORDER — TRIAMCINOLONE ACETONIDE 0.1 % EX CREA: TOPICAL_CREAM | Freq: Two times a day (BID) | CUTANEOUS | Status: DC

## 2013-04-29 NOTE — Assessment & Plan Note (Signed)
A: stable. P: Switch from zoloft 50 mg daily , stop date 05/14/13 to cymbalta 30 mg daily.  rx faxed to pharmacy.

## 2013-04-29 NOTE — Patient Instructions (Addendum)
Ms. Curfman,  Thank you for coming in today. When you run out of zoloft start cymbalta, sent to [facility] pharmacy.  I refilled kenalog.   Continue diclofenac for costochondritis.   Dr. Armen Pickup

## 2013-04-29 NOTE — Progress Notes (Signed)
Subjective:     Patient ID: Rance Muir, female   DOB: 07/15/1951, 62 y.o.   MRN: [id number]  HPI 62 yo F presents for f/u visit:  1. Depression: controlled on meds. Cost of zoloft up to $17/month. Would ike to switch to a medication available at the [facility] pharmacy. Brought list which includes prozac, cymbalta, effexor.   2. Costochondritis: persist. Taking diclofenac prn. Pain is worse with movement.   Review of Systems As per HPI     Objective:   Physical Exam BP 125/75  Pulse 70  Temp(Src) 98.3 F (36.8 C) (Oral)  Ht 5\' 3"  (1.6 m)  Wt 122 lb 7 oz (55.537 kg)  BMI 21.69 kg/m2 General appearance: alert, cooperative and no distress Chest: TTP L sternum.  Lungs: clear to auscultation bilaterally Heart: regular rate and rhythm, S1, S2 normal, no murmur, click, rub or gallop    Assessment and Plan:

## 2013-04-29 NOTE — Assessment & Plan Note (Signed)
A: persistent. P: Continue diclofenac.

## 2013-09-10 ENCOUNTER — Ambulatory Visit: Payer: PRIVATE HEALTH INSURANCE

## 2013-09-14 ENCOUNTER — Ambulatory Visit: Payer: Self-pay

## 2013-09-17 ENCOUNTER — Ambulatory Visit: Payer: Self-pay

## 2013-09-24 ENCOUNTER — Encounter: Payer: Self-pay | Admitting: Family Medicine

## 2013-09-24 ENCOUNTER — Ambulatory Visit (INDEPENDENT_AMBULATORY_CARE_PROVIDER_SITE_OTHER): Payer: No Typology Code available for payment source | Admitting: Family Medicine

## 2013-09-24 ENCOUNTER — Other Ambulatory Visit (HOSPITAL_COMMUNITY)
Admission: RE | Admit: 2013-09-24 | Discharge: 2013-09-24 | Disposition: A | Discharge status: Home / Self Care | Payer: No Typology Code available for payment source | Source: Ambulatory Visit | Attending: Family Medicine | Admitting: Family Medicine

## 2013-09-24 VITALS — BP 148/80 | HR 72 | Temp 97.9°F | Wt 122.0 lb

## 2013-09-24 DIAGNOSIS — F17200 Nicotine dependence, unspecified, uncomplicated: Secondary | ICD-10-CM

## 2013-09-24 DIAGNOSIS — Z01419 Encounter for gynecological examination (general) (routine) without abnormal findings: Secondary | ICD-10-CM | POA: Insufficient documentation

## 2013-09-24 DIAGNOSIS — Q828 Other specified congenital malformations of skin: Secondary | ICD-10-CM

## 2013-09-24 DIAGNOSIS — Z1151 Encounter for screening for human papillomavirus (HPV): Secondary | ICD-10-CM | POA: Insufficient documentation

## 2013-09-24 DIAGNOSIS — Z124 Encounter for screening for malignant neoplasm of cervix: Secondary | ICD-10-CM

## 2013-09-24 DIAGNOSIS — F329 Major depressive disorder, single episode, unspecified: Secondary | ICD-10-CM

## 2013-09-24 LAB — COMPREHENSIVE METABOLIC PANEL
ALT: 10 U/L (ref 0–35)
AST: 20 U/L (ref 0–37)
CO2: 29 mEq/L (ref 19–32)
Calcium: 9.4 mg/dL (ref 8.4–10.5)
Chloride: 103 mEq/L (ref 96–112)
Creat: 1.03 mg/dL (ref 0.50–1.10)
Potassium: 4.6 mEq/L (ref 3.5–5.3)
Sodium: 140 mEq/L (ref 135–145)
Total Protein: 7 g/dL (ref 6.0–8.3)

## 2013-09-24 MED ORDER — AMITRIPTYLINE HCL 50 MG PO TABS: 50.0000 mg | ORAL_TABLET | Freq: Every day | ORAL | Status: DC

## 2013-09-24 MED ORDER — TRIAMCINOLONE ACETONIDE 0.1 % EX CREA: TOPICAL_CREAM | Freq: Two times a day (BID) | CUTANEOUS | Status: DC

## 2013-09-24 NOTE — Assessment & Plan Note (Signed)
Not ready to quit.  Smoking cessation discussed material provided.

## 2013-09-24 NOTE — Progress Notes (Signed)
Subjective:    Patient ID: Michelle [name], female    DOB: 06/01/1951, 62 y.o.   MRN: [id number]  HPI 62 year old female presents for physical. Patient patient has depression and anxiety as well as very chatty during the office visits and always has multiple complaints/concerns. Today since we did a physical we focused on only one acute concern.  1. left ear pain: Patient with intermittent left ear pain. Pain is improved post irrigation was patient at home. She still has mild pain in her eyes. She denies fever.  2. extremely dense breasts: Patient had a negative screening mammogram on 01/08/2013. In report commented on the density of her breast. She requests a breast exam today. She denies breast pain, lumps, nipple discharge. 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   Review of Systems  Constitutional: Negative.   HENT: Positive for tinnitus and voice change. Negative for congestion, dental problem, drooling, facial swelling, mouth sores, nosebleeds, postnasal drip, sinus pressure, sneezing and trouble swallowing.   Eyes: Negative.   Respiratory: Negative for apnea, choking, chest tightness, shortness of breath, wheezing and stridor.   Cardiovascular: Positive for chest pain, palpitations and leg swelling.       Left foot swelling.  Gastrointestinal: Negative for nausea, constipation, blood in stool, abdominal distention and rectal pain.  Endocrine: Positive for heat intolerance and polydipsia. Negative for cold intolerance, polyphagia and polyuria.  Genitourinary: Positive for vaginal pain and dyspareunia. Negative for dysuria, urgency, frequency, hematuria, flank pain, decreased urine volume, vaginal bleeding, vaginal discharge, enuresis, difficulty urinating, genital sores, menstrual problem and pelvic pain.  Musculoskeletal: Positive for arthralgias and myalgias. Negative for back pain, gait problem, joint swelling and neck stiffness.  Skin: Negative for  color change, pallor and wound.  Allergic/Immunologic: Negative.   Neurological: Positive for numbness. Negative for dizziness, tremors, seizures, syncope, facial asymmetry, speech difficulty, weakness and light-headedness.  Hematological: Negative.   Psychiatric/Behavioral: Positive for confusion, sleep disturbance and decreased concentration. Negative for suicidal ideas, hallucinations, behavioral problems, self-injury, dysphoric mood and agitation. The patient is nervous/anxious. The patient is not hyperactive.       Objective:   Physical Exam  Constitutional: She is oriented to person, place, and time. She appears well-developed and well-nourished. No distress.  HENT:  Head: Normocephalic and atraumatic.  Right Ear: Tympanic membrane, external ear and ear canal normal.  Left Ear: External ear normal.  Cerumen left ear irrigated by the nurse.  Eyes: Conjunctivae and EOM are normal. Pupils are equal, round, and reactive to light.  Neck: Normal range of motion. Neck supple. No thyromegaly present.  Cardiovascular: Normal rate, regular rhythm, normal heart sounds and intact distal pulses.   No murmur heard. Pulmonary/Chest: Effort normal and breath sounds normal. No respiratory distress. She has no wheezes. She has no rales.  Abdominal: Soft. Bowel sounds are normal.  Genitourinary: Vagina normal and uterus normal. No breast swelling, tenderness, discharge or bleeding. No labial fusion. There is no lesion on the right labia. There is no lesion on the left labia. Cervix exhibits no motion tenderness, no discharge and no friability.  nulliparous cervix   Musculoskeletal: She exhibits no edema and no tenderness.  Lymphadenopathy:       Right: No inguinal adenopathy present.       Left: No inguinal adenopathy present.  Neurological: She is alert and oriented to person, place, and time. No cranial nerve deficit.  Skin: Skin is warm and dry. Rash noted.  Psychiatric: She has a  normal mood and  affect.   Pap done today. Patient did have discomfort to exam on her left vaginal wall. There were no defects or abrasions noted. No vaginal bleeding.        Assessment & Plan:

## 2013-09-24 NOTE — Patient Instructions (Signed)
Michelle [name],  Thank you for coming in today. I have refilled elavil and triamcinolone and will fax the prescription to [facility] pharmacy.  I will be in touch with pap smear results. Your breast exam is normal.  Please plan for repeat physical in one year.   Smoking cessation support: smoking cessation hotline: 1-800-QUIT-NOW.  Here is the number to the smoking cessation classes at [facility]: [phone]   Dr. Armen Pickup

## 2013-09-24 NOTE — Assessment & Plan Note (Signed)
Compliant with her Elavil and Cymbalta. Reports some sedation with a combination. Occasionally takes only 25 mg of Elavil.  Advised  patient that it is okay to take 25 or 50 mg of Elavil nightly along Cymbalta.

## 2013-09-24 NOTE — Assessment & Plan Note (Signed)
Stable. Active areas on her nipples extremities. No signs of secondary infection.  Plan to continue current medication regimen with triamcinolone and Sarna.

## 2013-09-25 ENCOUNTER — Encounter: Payer: Self-pay | Admitting: Family Medicine

## 2013-09-29 ENCOUNTER — Encounter: Payer: Self-pay | Admitting: Family Medicine

## 2013-10-20 ENCOUNTER — Telehealth: Payer: Self-pay | Admitting: Family Medicine

## 2013-10-20 DIAGNOSIS — E785 Hyperlipidemia, unspecified: Secondary | ICD-10-CM

## 2013-10-20 MED ORDER — ROSUVASTATIN CALCIUM 20 MG PO TABS: 20.0000 mg | ORAL_TABLET | Freq: Every day | ORAL | Status: DC

## 2013-10-20 NOTE — Telephone Encounter (Signed)
crestor added rx printed and faxed to [facility] pharmacy  Letter sent to patient

## 2013-10-20 NOTE — Assessment & Plan Note (Signed)
10 yr CVD risk 4.2 ( age, smoker, elevated BP)  Moderate to high intensity statin recommended.  Plan to start crestor 20 mg daily.  rx printed and sent the [facility] pharmacy.  Letter sent to patient.

## 2013-12-29 ENCOUNTER — Ambulatory Visit: Payer: No Typology Code available for payment source | Admitting: Family Medicine

## 2013-12-30 ENCOUNTER — Encounter: Payer: Self-pay | Admitting: Family Medicine

## 2013-12-30 ENCOUNTER — Ambulatory Visit (INDEPENDENT_AMBULATORY_CARE_PROVIDER_SITE_OTHER): Payer: No Typology Code available for payment source | Admitting: Family Medicine

## 2013-12-30 ENCOUNTER — Encounter: Payer: Self-pay | Admitting: *Deleted

## 2013-12-30 VITALS — BP 149/84 | HR 68 | Temp 98.1°F | Wt 124.0 lb

## 2013-12-30 DIAGNOSIS — J069 Acute upper respiratory infection, unspecified: Secondary | ICD-10-CM

## 2013-12-30 NOTE — Patient Instructions (Signed)

## 2013-12-30 NOTE — Progress Notes (Signed)
Subjective:     Patient ID: Michelle [name], female   DOB: 06-28-1951, 63 y.o.   MRN: [id number]  HPI 63 y.o. F presents with 3 weeks of coughing productive pt reports that she had worsened initially and now starting to improve. No fevers chills, +HA at baseline, Off and on shortness of breath when coughing. No n/v. Some sinus pain and endorses congestion.  Improvement on robtussem. Pt has had minimal blood when blowing nose. Pt has been having some weakness as well but is improving since Monday.   Review of Systems See above    Objective:   Physical Exam Filed Vitals:   12/30/13 1349  BP: 149/84  Pulse: 68  Temp: 98.1 F (36.7 C)  VSS NAD No LAD, minor sinus tenderness CTAB no wrc RRR no mgt      Assessment:     63 y.o. F here with likely cesataion of tobacco use and minor  Upper respiratory infection. Pt is improving. Cautioned slow resolution. Recommend cough syrup and OTC cold medicine. Low suspicion for bacterial etiology. Nop abx at this time. Pt to f/u worsening sx - f/c, sob, ect.  Fredrik Rigger, MD OB Fellow

## 2014-01-20 ENCOUNTER — Telehealth: Payer: Self-pay | Admitting: Family Medicine

## 2014-01-20 NOTE — Telephone Encounter (Signed)
Pt called and had some questions about the map program and her medications and would like someone to call her. jw

## 2014-01-21 MED ORDER — ATORVASTATIN CALCIUM 40 MG PO TABS: 40.0000 mg | ORAL_TABLET | Freq: Every day | ORAL | Status: DC

## 2014-01-21 NOTE — Telephone Encounter (Signed)
Pt states that she was asked to sign some papers about receiving some Lipitor and did not know why.  Contacted pharmacy but the papers she signed were about crestor.  Spoke with patient again to relay info.  Pt states that she did not know about the crestor, read letter to her from MD in November 2014.  Pt states that she has never started taking crestor and wants to know if she can take her leftover lipitor instead if it has not expired.  MD approved this and pt informed to take 40mg  daily.  Willl forward to MD to notate in chart. Fleeger, Salome Spotted

## 2014-03-22 ENCOUNTER — Ambulatory Visit (INDEPENDENT_AMBULATORY_CARE_PROVIDER_SITE_OTHER): Payer: No Typology Code available for payment source | Admitting: Family Medicine

## 2014-03-22 ENCOUNTER — Encounter: Payer: Self-pay | Admitting: Family Medicine

## 2014-03-22 ENCOUNTER — Telehealth: Payer: Self-pay | Admitting: *Deleted

## 2014-03-22 VITALS — BP 134/88 | HR 63 | Temp 97.8°F | Ht 63.0 in | Wt 133.4 lb

## 2014-03-22 DIAGNOSIS — F329 Major depressive disorder, single episode, unspecified: Secondary | ICD-10-CM

## 2014-03-22 DIAGNOSIS — R011 Cardiac murmur, unspecified: Secondary | ICD-10-CM | POA: Insufficient documentation

## 2014-03-22 DIAGNOSIS — F3289 Other specified depressive episodes: Secondary | ICD-10-CM

## 2014-03-22 DIAGNOSIS — R21 Rash and other nonspecific skin eruption: Secondary | ICD-10-CM | POA: Insufficient documentation

## 2014-03-22 LAB — POCT SKIN KOH: SKIN KOH, POC: NEGATIVE

## 2014-03-22 MED ORDER — TRIAMCINOLONE ACETONIDE 0.5 % EX OINT: 1.0000 "application " | TOPICAL_OINTMENT | Freq: Two times a day (BID) | CUTANEOUS | Status: DC

## 2014-03-22 MED ORDER — DULOXETINE HCL 30 MG PO CPEP: 30.0000 mg | ORAL_CAPSULE | Freq: Every day | ORAL | Status: DC

## 2014-03-22 NOTE — Telephone Encounter (Signed)
Message copied by Valerie Roys on Mon Mar 22, 2014  4:17 PM ------      Message from: Boykin Nearing      Created: Mon Mar 22, 2014  3:16 PM       Negative KOH.      Please inform patient ------

## 2014-03-22 NOTE — Assessment & Plan Note (Signed)
A: suspect Darier's flare, r/o tinea/candidal infection P: KOH skin scraping I would like you to use the stronger kenalog 0.5 % to use with the sarna.

## 2014-03-22 NOTE — Patient Instructions (Addendum)
Thank you for coming in today. Please taper off cymbalta: 30 mg every other day for 6 days, then every 2 days for 5 days, then stop.    I would like you to use the stronger kenalog 0.5 % to use with the sarna.  I have done a KOH to rule out topical fungal infection if positive I will call.   Dr. Adrian Blackwater

## 2014-03-22 NOTE — Telephone Encounter (Signed)
Pt is aware of results. Skiler Tye,CMA  

## 2014-03-22 NOTE — Progress Notes (Signed)
   Subjective:    Patient ID: Michelle [name], female    DOB: 11-23-1951, 63 y.o.   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 Nipple rash: rash around both nipples x 2 weeks. Also under breast and low back. Associated with itching. Using sarna and hydrocortisone with minimal relief.   2. Depression: patient with depression, insomnia, chronic pain. Taking elavil and cymbalta. Would like to taper off cymbalta. Feels like depression is well controlled. Has some acute upper back pain following lifting at work, but in general chronic pain is well controlled. Sleeps well.   Soc Hx: smokes 1 pack monthly  Review of Systems As per HPI     Objective:   Physical Exam BP 134/88  Pulse 63  Temp(Src) 97.8 F (36.6 C) (Oral)  Ht 5\' 3"  (1.6 m)  Wt 133 lb 6.4 oz (60.51 kg)  BMI 23.64 kg/m2 General appearance: alert, cooperative and no distress Back: midline thoracic back tenderness,  symmetric, no curvature. ROM normal. No CVA tenderness. Skin: dry, scaly plaques around both nipples, scaly papules under both breast and low back   KOH skin scraping done.       Assessment & Plan:

## 2014-03-22 NOTE — Assessment & Plan Note (Signed)
A: persistent. Improved P: Taper cymbalta per AVS.

## 2014-06-02 ENCOUNTER — Encounter: Payer: Self-pay | Admitting: *Deleted

## 2014-06-02 NOTE — Progress Notes (Signed)
Prior Authorization received from [employer] for [employer].  PA form placed in provider box for completion. Derl Barrow, RN

## 2014-06-03 NOTE — Progress Notes (Signed)
PA for Crestor faxed to [employer] for review.  Derl Barrow, RN

## 2014-06-03 NOTE — Progress Notes (Signed)
Patient ID: Michelle [name], female   DOB: Sep 28, 1951, 63 y.o.   MRN: [id number] PA form filled out and returned to [facility].

## 2014-06-07 NOTE — Progress Notes (Signed)
PA for Crestor was approved through [employer].  [facility] is aware of approval. Derl Barrow, RN

## 2014-08-30 ENCOUNTER — Other Ambulatory Visit: Payer: Self-pay | Admitting: *Deleted

## 2014-08-31 ENCOUNTER — Telehealth: Payer: Self-pay | Admitting: Family Medicine

## 2014-08-31 MED ORDER — TRIAMCINOLONE ACETONIDE 0.5 % EX OINT: 1.0000 "application " | TOPICAL_OINTMENT | Freq: Two times a day (BID) | CUTANEOUS | Status: DC

## 2014-08-31 NOTE — Telephone Encounter (Signed)
Spoke with patient and informed her of below 

## 2014-08-31 NOTE — Telephone Encounter (Signed)
LVM for patient to call back. ?

## 2014-08-31 NOTE — Telephone Encounter (Signed)
Steroid cream refilled, pt needs to make an appointment to meet new PCP and be evaluated at her earliest convenience.  Thanks.

## 2014-09-06 ENCOUNTER — Other Ambulatory Visit: Payer: Self-pay | Admitting: *Deleted

## 2014-09-06 ENCOUNTER — Encounter: Payer: Self-pay | Admitting: Family Medicine

## 2014-09-06 ENCOUNTER — Ambulatory Visit (INDEPENDENT_AMBULATORY_CARE_PROVIDER_SITE_OTHER): Payer: No Typology Code available for payment source | Admitting: Family Medicine

## 2014-09-06 ENCOUNTER — Ambulatory Visit (HOSPITAL_COMMUNITY)
Admission: RE | Admit: 2014-09-06 | Discharge: 2014-09-06 | Disposition: A | Discharge status: Home / Self Care | Payer: No Typology Code available for payment source | Source: Ambulatory Visit | Attending: Family Medicine | Admitting: Family Medicine

## 2014-09-06 VITALS — BP 136/79 | HR 61 | Temp 98.3°F | Ht 63.0 in | Wt 129.5 lb

## 2014-09-06 DIAGNOSIS — Z23 Encounter for immunization: Secondary | ICD-10-CM

## 2014-09-06 DIAGNOSIS — IMO0001 Reserved for inherently not codable concepts without codable children: Secondary | ICD-10-CM

## 2014-09-06 DIAGNOSIS — I10 Essential (primary) hypertension: Secondary | ICD-10-CM | POA: Insufficient documentation

## 2014-09-06 DIAGNOSIS — R55 Syncope and collapse: Secondary | ICD-10-CM

## 2014-09-06 DIAGNOSIS — R030 Elevated blood-pressure reading, without diagnosis of hypertension: Secondary | ICD-10-CM

## 2014-09-06 LAB — CBC
HEMATOCRIT: 40.9 % (ref 36.0–46.0)
Hemoglobin: 13.6 g/dL (ref 12.0–15.0)
MCH: 29.8 pg (ref 26.0–34.0)
MCHC: 33.3 g/dL (ref 30.0–36.0)
MCV: 89.7 fL (ref 78.0–100.0)
PLATELETS: 233 10*3/uL (ref 150–400)
RBC: 4.56 MIL/uL (ref 3.87–5.11)
RDW: 13.7 % (ref 11.5–15.5)
WBC: 7.1 10*3/uL (ref 4.0–10.5)

## 2014-09-06 LAB — COMPREHENSIVE METABOLIC PANEL
ALT: 11 U/L (ref 0–35)
AST: 21 U/L (ref 0–37)
Albumin: 4.4 g/dL (ref 3.5–5.2)
Alkaline Phosphatase: 63 U/L (ref 39–117)
BILIRUBIN TOTAL: 0.5 mg/dL (ref 0.2–1.2)
BUN: 11 mg/dL (ref 6–23)
CHLORIDE: 104 meq/L (ref 96–112)
CO2: 29 meq/L (ref 19–32)
Calcium: 9 mg/dL (ref 8.4–10.5)
Creat: 0.95 mg/dL (ref 0.50–1.10)
Glucose, Bld: 70 mg/dL (ref 70–99)
Potassium: 4.2 mEq/L (ref 3.5–5.3)
SODIUM: 139 meq/L (ref 135–145)
TOTAL PROTEIN: 6.7 g/dL (ref 6.0–8.3)

## 2014-09-06 NOTE — Patient Instructions (Signed)
Please follow up with the cardiologist and have your echocardiogram done.  Please call your Primary Care Doctor, Dr. Raoul Pitch, to arrange to talk about your other issues.  Thanks, Dr. Awanda Mink
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
## 2014-09-06 NOTE — Assessment & Plan Note (Signed)
Story concerning for cardiac origin.  W/ her risk factors (Smoking, HLD) and hx of previous heart surgery w/ murmur, will obtain EKG today and send for Echo (considering stress echo but will send to cardiology for either stress echo vs lexiscan myoview).  Continue with ASA and Crestor and f/u with PCP in next 2 weeks.

## 2014-09-07 MED ORDER — ROSUVASTATIN CALCIUM 20 MG PO TABS
20.0000 mg | ORAL_TABLET | Freq: Every day | ORAL | Status: DC
Start: 2014-09-07 — End: 2014-09-17

## 2014-09-07 MED ORDER — AMITRIPTYLINE HCL 50 MG PO TABS: 50.0000 mg | ORAL_TABLET | Freq: Every day | ORAL | Status: DC

## 2014-09-08 ENCOUNTER — Encounter: Payer: Self-pay | Admitting: *Deleted

## 2014-09-17 ENCOUNTER — Ambulatory Visit (INDEPENDENT_AMBULATORY_CARE_PROVIDER_SITE_OTHER): Payer: No Typology Code available for payment source | Admitting: Family Medicine

## 2014-09-17 ENCOUNTER — Encounter: Payer: Self-pay | Admitting: Family Medicine

## 2014-09-17 ENCOUNTER — Telehealth: Payer: Self-pay | Admitting: *Deleted

## 2014-09-17 VITALS — BP 142/90 | HR 65 | Temp 97.4°F | Ht 63.0 in | Wt 127.9 lb

## 2014-09-17 DIAGNOSIS — H6120 Impacted cerumen, unspecified ear: Secondary | ICD-10-CM | POA: Insufficient documentation

## 2014-09-17 DIAGNOSIS — I10 Essential (primary) hypertension: Secondary | ICD-10-CM | POA: Insufficient documentation

## 2014-09-17 DIAGNOSIS — R55 Syncope and collapse: Secondary | ICD-10-CM

## 2014-09-17 DIAGNOSIS — H6122 Impacted cerumen, left ear: Secondary | ICD-10-CM

## 2014-09-17 DIAGNOSIS — IMO0001 Reserved for inherently not codable concepts without codable children: Secondary | ICD-10-CM

## 2014-09-17 DIAGNOSIS — F17200 Nicotine dependence, unspecified, uncomplicated: Secondary | ICD-10-CM

## 2014-09-17 DIAGNOSIS — E785 Hyperlipidemia, unspecified: Secondary | ICD-10-CM

## 2014-09-17 DIAGNOSIS — R030 Elevated blood-pressure reading, without diagnosis of hypertension: Secondary | ICD-10-CM

## 2014-09-17 MED ORDER — TRIAMCINOLONE ACETONIDE 0.1 % EX CREA: TOPICAL_CREAM | Freq: Two times a day (BID) | CUTANEOUS | Status: DC

## 2014-09-17 MED ORDER — CARBAMIDE PEROXIDE 6.5 % OT SOLN: 5.0000 [drp] | Freq: Two times a day (BID) | OTIC | Status: DC

## 2014-09-17 MED ORDER — ATORVASTATIN CALCIUM 40 MG PO TABS: 40.0000 mg | ORAL_TABLET | Freq: Every day | ORAL | Status: DC

## 2014-09-17 NOTE — Patient Instructions (Addendum)
We will call you by telephone to schedule your echo. Make certain to keep your cardiology followup. I will change her Crestor to another medication that hopefully her insurance will cover, if there is any problems please have your pharmacy call back and we'll change to an approved medication. Please make certain to schedule your mammogram and your colonoscopy as his possible. I will call you when you're lab results are available. Your blood pressure on repeat it was 140/90. We will continue to monitor this and your cardiologist will also be able to monitor as well.

## 2014-09-17 NOTE — Telephone Encounter (Signed)
Left message to return call. Please let patient know she has an appointment for 2D Echo at [facility] on 09/27/14 at 11:00. She will need to check in at the admissions office at 10:45. If she is unable to make this appointment she will need to call [phone] to reschedule. For her screening colonoscopy she can call [facility] at [phone] or [facility] at [phone].[name], Kevin Fenton

## 2014-09-18 LAB — LIPID PANEL
Cholesterol: 120 mg/dL (ref 0–200)
HDL: 49 mg/dL (ref >39)
LDL CALC: 59 mg/dL (ref 0–99)
TRIGLYCERIDES: 62 mg/dL (ref <150)
Total CHOL/HDL Ratio: 2.4 Ratio
VLDL: 12 mg/dL (ref 0–40)

## 2014-09-19 ENCOUNTER — Encounter: Payer: Self-pay | Admitting: Family Medicine

## 2014-09-19 NOTE — Assessment & Plan Note (Signed)
Pt tolerated ear wax removal well. Wax was unable to be fully removed during irrigation.  Discussed with patient home use of debrox and possible ENT referral if unable to remove fully at home.  Debrox prescribed.  F/U PRN

## 2014-09-19 NOTE — Assessment & Plan Note (Signed)
Elevated BP today, initial 168/90 and repeat 142/90. Patient with borderline elevated BP in the past. Goal <150/90. Discussed with patient low salt/no added salt diet. Discussed we will wait to see her BP on her cardiology appointment in 2 weeks. If is elevated above goal at that time we will consider low dose HCTZ.  F/u 1 month

## 2014-09-19 NOTE — Assessment & Plan Note (Signed)
Patient has cut back a great deal on her smoking. Congratulated her.  Dicussed in detail assistance that is available.  Patient decided she would try the nicotine gum as needed.  F/u prn

## 2014-09-19 NOTE — Assessment & Plan Note (Signed)
Given pts cardiac defect and family history, story is concerning for cardiac origin.  Echo was not completed or scheduled prior to this f/u appointment. It is being scheduled now to be completed prior to cardiology referral appointment F/U 4 weeks.

## 2014-09-19 NOTE — Progress Notes (Signed)
   Subjective:    Patient ID: Michelle [name], female    DOB: May 29, 1951, 63 y.o.   MRN: [id number]  HPI Michelle [name] is 63 y.o. Female presented to [facility] for pre-syncope f/u  Pre- Syncope followup: patient presents to [facility] for f/u visit after a pre- syncopal episode that occurred at her place of employment a few weeks ago. She states she felt dizzy,faint and blurred vision upon attempting to stand.  She had no CP, DOE or palpitations associated. Her BP was taken at that time and was ~140/100. This has never occurred to her before and has no occurred since that event.  Her EKG in the office was very mildly bradycardic (58) and was with a RBBB, which is consistent with EKG in 2012. She denies leg edema, orthopnea, DOE or chest pain. 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 Patient is not aware of the specifics of her heart defect, other than it was completed at [facility], when she was young child and it was a "blockage between my heart and lungs and they took a graft from my leg."  Her brother died of heart disease around the age of 42. She has not had her echo completed as of yet. Her cardiology referral is scheduled for Oct. 14th. She use to smoke 1ppd of cigarettes, but has cut back to 3 cigarettes a day. She is on crestor daily and takes a daily baby ASA.  Elevated BP: Pt has not had a history of borderline BP readings in the past. She denies ever being placed on medications. She does not watch the salt content in her diet and admits to eating many ready made freezer meals. She denies headaches, leg edema, cp, dyspnea or orthopnea.   Smoking cessation: Patient has smoked since she was a young adult. She use to smoke 1ppd but recently has cut back to 3 cigarettes a day. She reports she is confident she can quit smoking and does not desire additional assistance at this time.   Review of Systems Per hpi    Objective:   Physical Exam BP 162/78  Pulse 65  Temp(Src) 97.4 F (36.3 C) (Oral)  Ht 5\' 3"  (1.6 m)  Wt 127 lb 14.4 oz (58.015 kg)  BMI 22.66 kg/m2 Gen: Pleasant, talkative Caucasian female. No acute distress, well developed, well nourished. HEENT: AT. [place]. Right TM visualized and normal in appearance. Bilateral eyes without injections or icterus. MMM. Bilateral nares without erythema or swelling. Throat without erythema or exudates.  CV: RRR, 3/6 systolic murmur. Chest: CTAB, no wheeze or crackles. Diminished breath sounds bilaterally. Abd: Soft. Flat. NTND. BS present.  No Masses palpated.  Ext: No erythema. No edema.  no tenderness. 2+/4 DP PT  Skin: Multiple scarred areas over arms, with fine eczema-like rash..  Neuro: Normal gait. PERLA. EOMi. Alert.  cranial nerves II through XII intact        Assessment & Plan:

## 2014-09-19 NOTE — Assessment & Plan Note (Signed)
Lipid profile today.  Will change crestor to Lipitor [employer] formulary change)

## 2014-09-27 ENCOUNTER — Other Ambulatory Visit (HOSPITAL_COMMUNITY): Payer: No Typology Code available for payment source

## 2014-09-28 ENCOUNTER — Ambulatory Visit (INDEPENDENT_AMBULATORY_CARE_PROVIDER_SITE_OTHER): Payer: No Typology Code available for payment source | Admitting: Cardiovascular Disease

## 2014-09-28 ENCOUNTER — Telehealth: Payer: Self-pay | Admitting: Cardiovascular Disease

## 2014-09-28 ENCOUNTER — Encounter: Payer: Self-pay | Admitting: Cardiovascular Disease

## 2014-09-28 VITALS — BP 150/90 | HR 64 | Ht 63.0 in | Wt 128.8 lb

## 2014-09-28 DIAGNOSIS — F17200 Nicotine dependence, unspecified, uncomplicated: Secondary | ICD-10-CM

## 2014-09-28 DIAGNOSIS — R55 Syncope and collapse: Secondary | ICD-10-CM

## 2014-09-28 DIAGNOSIS — Q249 Congenital malformation of heart, unspecified: Secondary | ICD-10-CM

## 2014-09-28 NOTE — Patient Instructions (Signed)
Your physician recommends that you continue on your current medications as directed. Please refer to the Current Medication list given to you today.  Your physician recommends that you schedule a follow-up appointment in: 3 MONTHS WITH DR. NAHSER  

## 2014-09-28 NOTE — Assessment & Plan Note (Signed)
She has a congenital heart defect and had an open heart operation in 1958.   She was told that she had a "blockage between the heart and the lungs" .  She also had a vein from her leg that was used as a conduit for the operation.   She brought a newspaper clipping from 1958 from [place] that told of her surgery  ( in very basic terms)   Will get an echo to evaluate her surgical repair. We may need to get a TEE to further define her anatomy.    ( ? Pulmonary atresia)

## 2014-09-28 NOTE — Telephone Encounter (Signed)
error 

## 2014-09-28 NOTE — Assessment & Plan Note (Signed)
I've encouraged her to stop smoking. 
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
## 2014-09-28 NOTE — Assessment & Plan Note (Signed)
Michelle [name] has a hx of dizziness.  We performed orthostatic BP recordings and she did not have any significant drop in her BP. She has a hx of congenital heart disease and had surgery at age 63 at 58.  She has a systolic and diastolic murmur. We need to get an echo to evaluate exactly what she had done.  She has no cyanosis or clubbing.

## 2014-09-30 ENCOUNTER — Ambulatory Visit (HOSPITAL_COMMUNITY)
Admission: RE | Admit: 2014-09-30 | Discharge: 2014-09-30 | Disposition: A | Discharge status: Home / Self Care | Payer: No Typology Code available for payment source | Source: Ambulatory Visit | Attending: Family Medicine | Admitting: Family Medicine

## 2014-09-30 DIAGNOSIS — R011 Cardiac murmur, unspecified: Secondary | ICD-10-CM | POA: Insufficient documentation

## 2014-09-30 DIAGNOSIS — E785 Hyperlipidemia, unspecified: Secondary | ICD-10-CM | POA: Diagnosis not present

## 2014-09-30 DIAGNOSIS — I10 Essential (primary) hypertension: Secondary | ICD-10-CM | POA: Insufficient documentation

## 2014-09-30 DIAGNOSIS — Z720 Tobacco use: Secondary | ICD-10-CM | POA: Diagnosis not present

## 2014-09-30 DIAGNOSIS — I369 Nonrheumatic tricuspid valve disorder, unspecified: Secondary | ICD-10-CM

## 2014-09-30 DIAGNOSIS — R55 Syncope and collapse: Secondary | ICD-10-CM

## 2014-09-30 DIAGNOSIS — Q249 Congenital malformation of heart, unspecified: Secondary | ICD-10-CM

## 2014-09-30 NOTE — Progress Notes (Signed)
  Echocardiogram 2D Echocardiogram has been performed.  Donata Clay 09/30/2014, 12:00 PM

## 2014-10-07 ENCOUNTER — Encounter: Payer: Self-pay | Admitting: Family Medicine

## 2014-10-07 ENCOUNTER — Other Ambulatory Visit: Payer: Self-pay | Admitting: *Deleted

## 2014-10-07 ENCOUNTER — Ambulatory Visit (INDEPENDENT_AMBULATORY_CARE_PROVIDER_SITE_OTHER): Payer: No Typology Code available for payment source | Admitting: Family Medicine

## 2014-10-07 VITALS — BP 130/74 | HR 62 | Temp 97.7°F | Ht 63.0 in | Wt 129.4 lb

## 2014-10-07 DIAGNOSIS — J029 Acute pharyngitis, unspecified: Secondary | ICD-10-CM | POA: Insufficient documentation

## 2014-10-07 DIAGNOSIS — J020 Streptococcal pharyngitis: Secondary | ICD-10-CM

## 2014-10-07 DIAGNOSIS — R030 Elevated blood-pressure reading, without diagnosis of hypertension: Secondary | ICD-10-CM

## 2014-10-07 DIAGNOSIS — IMO0001 Reserved for inherently not codable concepts without codable children: Secondary | ICD-10-CM

## 2014-10-07 DIAGNOSIS — R011 Cardiac murmur, unspecified: Secondary | ICD-10-CM

## 2014-10-07 LAB — POCT RAPID STREP A (OFFICE): Rapid Strep A Screen: NEGATIVE

## 2014-10-07 NOTE — Patient Instructions (Signed)
Great to meet you!  You have a virus and should begin feeling better with rest in a few days.   Use tylenol for discomfort, drink plenty of fluids, try to get as much rest as possible.   Please come back, or seek medical help, right away if your symptoms worsen or you are not tolerating food or fluid by mouth.

## 2014-10-07 NOTE — Progress Notes (Signed)
Patient ID: Michelle [name], female   DOB: [birth year], 63 y.o.   MRN: [id number]   HPI  Patient presents today for sore throat   Patient states that she began getting ill about 5 days ago which started with sneezing. She began to feel worse with body aches, malaise, and sore throat over the next several days. Today she feels worse than she has that last several days and states that sore throats are predominantly worse symptom.  She reports normal by mouth intake, normal respirations, and denies chest pain.  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 She's supposed to go back to work in one day and works for the entire weekend.  Smoking status noted ROS: Per HPI  Objective: BP 162/92  Pulse 62  Temp(Src) 97.7 F (36.5 C) (Oral)  Ht 5\' 3"  (1.6 m)  Wt 129 lb 6.4 oz (58.695 kg)  BMI 22.93 kg/m2 Gen: NAD, alert, cooperative with exam HEENT: NCAT, TMs within normal limits bilaterally, left TM partially obstructed with heavy cerumen, nares swollen bilaterally, whitish discoloration of the tonsils bilaterally without discrete exudates. CV: RRR, good I6/N6, systolic and diastolic murmur Resp: CTABL, no wheezes, non-labored Ext: No edema, warm Neuro: Alert and oriented, No gross deficits  Assessment and plan:  Sore throat With other symptoms I feel this is most likely viral Discussed rest and tylenol Note written for work Rapid strep negative Follow up with PCP as previously planned.   Elevated BP BP initially elevated to 162/92 but decreased to 130/74 on repeat testing.  HR consistently in lower 60s so caution with BB Has grade 2 diastolic dysf, considering HR would reach for Ace first Will defer to PCP as its normal on re-check    Orders Placed This Encounter  Procedures  . Rapid Strep A

## 2014-10-07 NOTE — Assessment & Plan Note (Signed)
BP initially elevated to 162/92 but decreased to 130/74 on repeat testing.  HR consistently in lower 60s so caution with BB Has grade 2 diastolic dysf, considering HR would reach for Ace first Will defer to PCP as its normal on re-check

## 2014-10-07 NOTE — Assessment & Plan Note (Signed)
With other symptoms I feel this is most likely viral Discussed rest and tylenol Note written for work Rapid strep negative Follow up with PCP as previously planned.

## 2014-10-08 ENCOUNTER — Other Ambulatory Visit: Payer: Self-pay | Admitting: Family Medicine

## 2014-10-08 MED ORDER — AMITRIPTYLINE HCL 50 MG PO TABS: 50.0000 mg | ORAL_TABLET | Freq: Every day | ORAL | Status: DC

## 2014-10-08 MED ORDER — AMITRIPTYLINE HCL 50 MG PO TABS
50.0000 mg | ORAL_TABLET | Freq: Every day | ORAL | Status: DC
Start: 2014-10-08 — End: 2016-10-11

## 2014-10-20 ENCOUNTER — Ambulatory Visit (AMBULATORY_SURGERY_CENTER): Payer: Self-pay | Admitting: *Deleted

## 2014-10-20 VITALS — Ht 63.0 in | Wt 126.6 lb

## 2014-10-20 DIAGNOSIS — Z1211 Encounter for screening for malignant neoplasm of colon: Secondary | ICD-10-CM

## 2014-10-20 MED ORDER — MOVIPREP 100 G PO SOLR: 1.0000 | Freq: Once | ORAL | Status: DC

## 2014-10-20 NOTE — Progress Notes (Signed)
No egg or soy allergy. ewm No diet pills. No blood thinners. ewm No  home 02 use. ewm No problems with past sedation. ewm With her tonsillectomy had some issue with her heart, was age 63 at the time. She cannot remember the problem ewm Pt doesn't have e mail. ewm

## 2014-11-04 ENCOUNTER — Ambulatory Visit (AMBULATORY_SURGERY_CENTER): Payer: No Typology Code available for payment source | Admitting: Internal Medicine

## 2014-11-04 ENCOUNTER — Encounter: Payer: Self-pay | Admitting: Internal Medicine

## 2014-11-04 VITALS — BP 127/47 | HR 56 | Temp 97.1°F | Resp 18 | Ht 63.0 in | Wt 126.0 lb

## 2014-11-04 DIAGNOSIS — D120 Benign neoplasm of cecum: Secondary | ICD-10-CM

## 2014-11-04 DIAGNOSIS — Z1211 Encounter for screening for malignant neoplasm of colon: Secondary | ICD-10-CM

## 2014-11-04 MED ORDER — SODIUM CHLORIDE 0.9 % IV SOLN: 500.0000 mL | INTRAVENOUS | Status: DC

## 2014-11-04 NOTE — Progress Notes (Signed)
A/ox3 pleased with MAC, report to Kristen RN 

## 2014-11-04 NOTE — Patient Instructions (Signed)
YOU HAD AN ENDOSCOPIC PROCEDURE TODAY AT THE [facility]: Refer to the procedure report that was given to you for any specific questions about what was found during the examination.  If the procedure report does not answer your questions, please call your gastroenterologist to clarify.  If you requested that your care partner not be given the details of your procedure findings, then the procedure report has been included in a sealed envelope for you to review at your convenience later.  YOU SHOULD EXPECT: Some feelings of bloating in the abdomen. Passage of more gas than usual.  Walking can help get rid of the air that was put into your GI tract during the procedure and reduce the bloating. If you had a lower endoscopy (such as a colonoscopy or flexible sigmoidoscopy) you may notice spotting of blood in your stool or on the toilet paper. If you underwent a bowel prep for your procedure, then you may not have a normal bowel movement for a few days.  DIET: Your first meal following the procedure should be a light meal and then it is ok to progress to your normal diet.  A half-sandwich or bowl of soup is an example of a good first meal.  Heavy or fried foods are harder to digest and may make you feel nauseous or bloated.  Likewise meals heavy in dairy and vegetables can cause extra gas to form and this can also increase the bloating.  Drink plenty of fluids but you should avoid alcoholic beverages for 24 hours.  ACTIVITY: Your care partner should take you home directly after the procedure.  You should plan to take it easy, moving slowly for the rest of the day.  You can resume normal activity the day after the procedure however you should NOT DRIVE or use heavy machinery for 24 hours (because of the sedation medicines used during the test).    SYMPTOMS TO REPORT IMMEDIATELY: A gastroenterologist can be reached at any hour.  During normal business hours, 8:30 AM to 5:00 PM Monday through Friday,  call [phone].  After hours and on weekends, please call the GI answering service at [phone] who will take a message and have the physician on call contact you.   Following lower endoscopy (colonoscopy or flexible sigmoidoscopy):  Excessive amounts of blood in the stool  Significant tenderness or worsening of abdominal pains  Swelling of the abdomen that is new, acute  Fever of 100F or higher  FOLLOW UP: If any biopsies were taken you will be contacted by phone or by letter within the next 1-3 weeks.  Call your gastroenterologist if you have not heard about the biopsies in 3 weeks.  Our staff will call the home number listed on your records the next business day following your procedure to check on you and address any questions or concerns that you may have at that time regarding the information given to you following your procedure. This is a courtesy call and so if there is no answer at the home number and we have not heard from you through the emergency physician on call, we will assume that you have returned to your regular daily activities without incident.  SIGNATURES/CONFIDENTIALITY: You and/or your care partner have signed paperwork which will be entered into your electronic medical record.  These signatures attest to the fact that that the information above on your After Visit Summary has been reviewed and is understood.  Full responsibility of the confidentiality of this   discharge information lies with you and/or your care-partner.  Await pathology- next procedure in 5 years  Continue your normal medications  Please read over handouts about polyps diverticulosis, and high fiber diets

## 2014-11-04 NOTE — Progress Notes (Signed)
Called to room to assist during endoscopic procedure.  Patient ID and intended procedure confirmed with present staff. Received instructions for my participation in the procedure from the performing physician.  

## 2014-11-04 NOTE — Op Note (Signed)
[place]  [address]. [place], [address]   COLONOSCOPY PROCEDURE REPORT  PATIENT: Michelle [name], Michelle [name]  MR#: [id number] BIRTHDATE: 07-Apr-1951 , 65  yrs. old GENDER: female ENDOSCOPIST: Eustace Quail, MD REFERRED AV:WPVXY Kuneff, [place]:  11/04/2014 PROCEDURE:   Colonoscopy with snare polypectomy x 1 First Screening Colonoscopy - Avg.  risk and is 50 yrs.  old or older Yes.  Prior Negative Screening - Now for repeat screening. N/A  History of Adenoma - Now for follow-up colonoscopy & has been > or = to 3 yrs.  N/A  Polyps Removed Today? Yes. ASA CLASS:   Class I INDICATIONS:average risk for colorectal cancer. MEDICATIONS: Monitored anesthesia care and Propofol 200 mg IV  DESCRIPTION OF PROCEDURE:   After the risks benefits and alternatives of the procedure were thoroughly explained, informed consent was obtained.  The digital rectal exam revealed no abnormalities of the rectum.   The [id number]  endoscope was introduced through the anus and advanced to the cecum, which was identified by both the appendix and ileocecal valve. No adverse events experienced.   The quality of the prep was excellent, using MoviPrep  The instrument was then slowly withdrawn as the colon was fully examined.  COLON FINDINGS: A single adenomatous appearring polyp measuring 5 mm in size was found at the cecum.  A polypectomy was performed with a cold snare.  The resection was complete, the polyp tissue was completely retrieved and sent to histology.   There was moderate diverticulosis noted in the sigmoid colon.   The examination was otherwise normal.  Retroflexed views revealed no abnormalities. The time to cecum=4 minutes 20 seconds.  Withdrawal time=12 minutes 59 seconds.  The scope was withdrawn and the procedure completed. COMPLICATIONS: There were no immediate complications.  ENDOSCOPIC IMPRESSION: 1.   Single polyp measuring 5 mm in size was found at the  cecum; polypectomy was performed with a cold snare 2.   Moderate diverticulosis was noted in the sigmoid colon 3.   The examination was otherwise normal  RECOMMENDATIONS: 1. Follow up colonoscopy in 5 years  eSigned:  Eustace Quail, MD 11/04/2014 2:18 PM   cc: The Patient    ; Howard Pouch, DO ([place])

## 2014-11-05 ENCOUNTER — Telehealth: Payer: Self-pay | Admitting: *Deleted

## 2014-11-05 NOTE — Telephone Encounter (Signed)
No answer, left message to call if questions or concerns. 

## 2014-11-09 ENCOUNTER — Encounter: Payer: Self-pay | Admitting: Internal Medicine

## 2014-11-10 ENCOUNTER — Encounter: Payer: Self-pay | Admitting: Family Medicine

## 2014-11-10 DIAGNOSIS — Z0000 Encounter for general adult medical examination without abnormal findings: Secondary | ICD-10-CM | POA: Insufficient documentation

## 2014-11-30 ENCOUNTER — Ambulatory Visit (INDEPENDENT_AMBULATORY_CARE_PROVIDER_SITE_OTHER): Payer: No Typology Code available for payment source | Admitting: Family Medicine

## 2014-11-30 ENCOUNTER — Encounter: Payer: Self-pay | Admitting: Family Medicine

## 2014-11-30 VITALS — BP 149/88 | HR 76 | Temp 97.9°F | Ht 63.0 in | Wt 122.2 lb

## 2014-11-30 DIAGNOSIS — M940 Chondrocostal junction syndrome [Tietze]: Secondary | ICD-10-CM | POA: Insufficient documentation

## 2014-11-30 DIAGNOSIS — G5791 Unspecified mononeuropathy of right lower limb: Secondary | ICD-10-CM | POA: Insufficient documentation

## 2014-11-30 MED ORDER — DICLOFENAC SODIUM 1 % TD GEL: 2.0000 g | Freq: Four times a day (QID) | TRANSDERMAL | Status: DC | PRN

## 2014-11-30 MED ORDER — MELOXICAM 15 MG PO TABS: 15.0000 mg | ORAL_TABLET | Freq: Every day | ORAL | Status: DC

## 2014-11-30 NOTE — Patient Instructions (Signed)
Voltaren is a gel that you can apply to the area up to 4 times a day.  Take mobic once a day every day   I think your toe numbness is due to a compression of the nerves going to them when you are on your feet a lot. You should get new padding for your shoes to make this better.

## 2014-11-30 NOTE — Assessment & Plan Note (Signed)
Recent subacute onset favor inflammatory process over malignacy or fracture. Will defer XR and treat symptomatically with po and topical NSAID and heat as needed.

## 2014-11-30 NOTE — Assessment & Plan Note (Signed)
Compression of nerves affecting 2nd/3rd toes only when standing for prolonged periods of time. Recommended new footwear with increased padding over ball of foot. Could refer for orthotics/podiatry if problem continues with these modifications. Problem is not very bothersome at present.

## 2014-11-30 NOTE — Progress Notes (Signed)
Patient ID: Michelle [name], female   DOB: 01/25/1951, 63 y.o.   MRN: [id number]   Subjective:  Michelle [name] is a 63 y.o. female presenting to same day clinic for a knot on her left chest and toe numbness.  The knot is overlying the left lower ribcage and intermittently hurt since 3-4 months ago. It has not changed in size during that time. She describes a "pulling" pain made worse by moving her left arm. It only hurts when she is moving around specifically with her left arm. No history of others.   Second and third toes of her right foot have been going numb off and on for the past couple months. This has happened only when on her feet, which she is frequently standing at work using [employer]. Her shoes are > 46 year old.   - Review of Systems: Per HPI.  - Smoking status noted Objective:  BP 149/88 mmHg  Pulse 76  Temp(Src) 97.9 F (36.6 C) (Oral)  Ht 5\' 3"  (1.6 m)  Wt 122 lb 3.2 oz (55.43 kg)  BMI 21.65 kg/m2 Gen:  63 y.o. female in no distress MSK: Normal gait and station; anterior costochondral junction at lowest left rib with palpable inflammation which is tender, full active ROM of UE's, no other tenderness of ribs anywhere.   Right foot: No deformities. No morton's neuroma/click between metatarsal heads.  Sensation intact to monofilament testing at 1st and 3rd toes, 1st, 3rd, 5th metatarsal heads PT and DP pulse 2+ Left foot:  Normal with maculopapular rash.  Assessment/Plan:  HARMONY SANDELL is a 63 y.o. female here for rib pain  Problem List Items Addressed This Visit      Nervous and Auditory   Mononeuropathy of right lower extremity    Compression of nerves affecting 2nd/3rd toes only when standing for prolonged periods of time. Recommended new footwear with increased padding over ball of foot. Could refer for orthotics/podiatry if problem continues with these modifications. Problem is not very bothersome at present.       Musculoskeletal and  Integument   Costochondritis - Primary    Recent subacute onset favor inflammatory process over malignacy or fracture. Will defer XR and treat symptomatically with po and topical NSAID and heat as needed.     Relevant Medications      meloxicam (MOBIC) tablet      diclofenac sodium (VOLTAREN) 1 % GEL

## 2015-04-19 ENCOUNTER — Other Ambulatory Visit (HOSPITAL_COMMUNITY): Payer: Self-pay | Admitting: Nurse Practitioner

## 2015-04-19 DIAGNOSIS — Z1231 Encounter for screening mammogram for malignant neoplasm of breast: Secondary | ICD-10-CM

## 2015-04-22 ENCOUNTER — Ambulatory Visit (HOSPITAL_COMMUNITY)
Admission: RE | Admit: 2015-04-22 | Discharge: 2015-04-22 | Disposition: A | Discharge status: Home / Self Care | Payer: Medicare HMO | Source: Ambulatory Visit | Attending: Nurse Practitioner | Admitting: Nurse Practitioner

## 2015-04-22 DIAGNOSIS — Z1231 Encounter for screening mammogram for malignant neoplasm of breast: Secondary | ICD-10-CM | POA: Diagnosis not present

## 2015-04-25 ENCOUNTER — Other Ambulatory Visit: Payer: Self-pay | Admitting: Nurse Practitioner

## 2015-04-25 DIAGNOSIS — R928 Other abnormal and inconclusive findings on diagnostic imaging of breast: Secondary | ICD-10-CM

## 2015-05-02 ENCOUNTER — Ambulatory Visit
Admission: RE | Admit: 2015-05-02 | Discharge: 2015-05-02 | Disposition: A | Discharge status: Home / Self Care | Payer: Medicare HMO | Source: Ambulatory Visit | Attending: Nurse Practitioner | Admitting: Nurse Practitioner

## 2015-05-02 DIAGNOSIS — R928 Other abnormal and inconclusive findings on diagnostic imaging of breast: Secondary | ICD-10-CM

## 2015-05-10 DIAGNOSIS — R011 Cardiac murmur, unspecified: Secondary | ICD-10-CM | POA: Insufficient documentation

## 2015-06-29 LAB — HM DEXA SCAN

## 2015-08-24 ENCOUNTER — Emergency Department (HOSPITAL_COMMUNITY)
Admission: EM | Admit: 2015-08-24 | Discharge: 2015-08-24 | Disposition: A | Discharge status: Home / Self Care | Payer: Medicare HMO | Attending: Emergency Medicine | Admitting: Emergency Medicine

## 2015-08-24 ENCOUNTER — Emergency Department (HOSPITAL_COMMUNITY): Payer: Medicare HMO

## 2015-08-24 ENCOUNTER — Encounter (HOSPITAL_COMMUNITY): Payer: Self-pay | Admitting: Emergency Medicine

## 2015-08-24 DIAGNOSIS — Y998 Other external cause status: Secondary | ICD-10-CM | POA: Insufficient documentation

## 2015-08-24 DIAGNOSIS — Z79899 Other long term (current) drug therapy: Secondary | ICD-10-CM | POA: Insufficient documentation

## 2015-08-24 DIAGNOSIS — Z880 Allergy status to penicillin: Secondary | ICD-10-CM | POA: Diagnosis not present

## 2015-08-24 DIAGNOSIS — M1990 Unspecified osteoarthritis, unspecified site: Secondary | ICD-10-CM | POA: Diagnosis not present

## 2015-08-24 DIAGNOSIS — S0083XA Contusion of other part of head, initial encounter: Secondary | ICD-10-CM | POA: Insufficient documentation

## 2015-08-24 DIAGNOSIS — G8929 Other chronic pain: Secondary | ICD-10-CM | POA: Diagnosis not present

## 2015-08-24 DIAGNOSIS — F419 Anxiety disorder, unspecified: Secondary | ICD-10-CM | POA: Diagnosis not present

## 2015-08-24 DIAGNOSIS — Z23 Encounter for immunization: Secondary | ICD-10-CM | POA: Insufficient documentation

## 2015-08-24 DIAGNOSIS — S80211A Abrasion, right knee, initial encounter: Secondary | ICD-10-CM | POA: Diagnosis not present

## 2015-08-24 DIAGNOSIS — R011 Cardiac murmur, unspecified: Secondary | ICD-10-CM | POA: Diagnosis not present

## 2015-08-24 DIAGNOSIS — E785 Hyperlipidemia, unspecified: Secondary | ICD-10-CM | POA: Insufficient documentation

## 2015-08-24 DIAGNOSIS — Z7952 Long term (current) use of systemic steroids: Secondary | ICD-10-CM | POA: Insufficient documentation

## 2015-08-24 DIAGNOSIS — Z7982 Long term (current) use of aspirin: Secondary | ICD-10-CM | POA: Diagnosis not present

## 2015-08-24 DIAGNOSIS — S199XXA Unspecified injury of neck, initial encounter: Secondary | ICD-10-CM | POA: Insufficient documentation

## 2015-08-24 DIAGNOSIS — Y9389 Activity, other specified: Secondary | ICD-10-CM | POA: Diagnosis not present

## 2015-08-24 DIAGNOSIS — Z87891 Personal history of nicotine dependence: Secondary | ICD-10-CM | POA: Diagnosis not present

## 2015-08-24 DIAGNOSIS — W01198A Fall on same level from slipping, tripping and stumbling with subsequent striking against other object, initial encounter: Secondary | ICD-10-CM | POA: Diagnosis not present

## 2015-08-24 DIAGNOSIS — W19XXXA Unspecified fall, initial encounter: Secondary | ICD-10-CM

## 2015-08-24 DIAGNOSIS — Y92009 Unspecified place in unspecified non-institutional (private) residence as the place of occurrence of the external cause: Secondary | ICD-10-CM | POA: Diagnosis not present

## 2015-08-24 DIAGNOSIS — T148XXA Other injury of unspecified body region, initial encounter: Secondary | ICD-10-CM

## 2015-08-24 MED ORDER — TETANUS-DIPHTH-ACELL PERTUSSIS 5-2.5-18.5 LF-MCG/0.5 IM SUSP
0.5000 mL | Freq: Once | INTRAMUSCULAR | Status: AC
  Administered 2015-08-24: 0.5 mL via INTRAMUSCULAR
  Filled 2015-08-24: qty 0.5

## 2015-08-24 MED ORDER — HYDROCODONE-ACETAMINOPHEN 5-325 MG PO TABS
1.0000 | ORAL_TABLET | Freq: Once | ORAL | Status: AC
  Administered 2015-08-24: 1 via ORAL
  Filled 2015-08-24: qty 1

## 2015-08-24 NOTE — Discharge Instructions (Signed)
Return for worsening symptoms, including worsening pain, confusion, vomiting and unable to keep down food/fluids, inability to walk, or any other symptoms concerning to you.   Fall Prevention and Home Safety Falls cause injuries and can affect all age groups. It is possible to prevent falls.  HOW TO PREVENT FALLS  Wear shoes with rubber soles that do not have an opening for your toes.  Keep the inside and outside of your house well lit.  Use night lights throughout your home.  Remove clutter from floors.  Clean up floor spills.  Remove throw rugs or fasten them to the floor with carpet tape.  Do not place electrical cords across pathways.  Put grab bars by your tub, shower, and toilet. Do not use towel bars as grab bars.  Put handrails on both sides of the stairway. Fix loose handrails.  Do not climb on stools or stepladders, if possible.  Do not wax your floors.  Repair uneven or unsafe sidewalks, walkways, or stairs.  Keep items you use a lot within reach.  Be aware of pets.  Keep emergency numbers next to the telephone.  Put smoke detectors in your home and near bedrooms. Ask your doctor what other things you can do to prevent falls. Document Released: 09/29/2009 Document Revised: 06/03/2012 Document Reviewed: 03/04/2012 [facility] Patient Information 2015 [place], [place]. This information is not intended to replace advice given to you by your health care provider. Make sure you discuss any questions you have with your health care provider.

## 2015-08-24 NOTE — ED Notes (Addendum)
Pt states she fell on concrete last night after tripping on step, pt states she hit L side of head, small abrasion noted. Pt states she was aware she hit head and decided to stay on the ground for a while. Unclear if she had LOC. Pt also c/o R knee, R foot and bilat elbow pain. Pt has full recollection of events.
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
## 2015-11-14 ENCOUNTER — Other Ambulatory Visit: Payer: Self-pay | Admitting: Family Medicine

## 2016-01-03 ENCOUNTER — Encounter (HOSPITAL_COMMUNITY): Payer: Self-pay | Admitting: Emergency Medicine

## 2016-01-03 ENCOUNTER — Emergency Department (HOSPITAL_COMMUNITY)
Admission: EM | Admit: 2016-01-03 | Discharge: 2016-01-03 | Disposition: A | Discharge status: Home / Self Care | Payer: PPO | Source: Home / Self Care | Attending: Emergency Medicine | Admitting: Emergency Medicine

## 2016-01-03 DIAGNOSIS — B029 Zoster without complications: Secondary | ICD-10-CM | POA: Diagnosis not present

## 2016-01-03 MED ORDER — VALACYCLOVIR HCL 1 G PO TABS: 1000.0000 mg | ORAL_TABLET | Freq: Three times a day (TID) | ORAL | Status: AC

## 2016-01-03 MED ORDER — HYDROCODONE-ACETAMINOPHEN 5-325 MG PO TABS: 1.0000 | ORAL_TABLET | Freq: Four times a day (QID) | ORAL | Status: DC | PRN

## 2016-01-03 NOTE — ED Notes (Signed)
The patient presented to the [facility] with a complaint of right sided flank and rib pain that started 4 days ago.
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
## 2016-01-03 NOTE — Discharge Instructions (Signed)
Shingles Shingles is an infection that causes a painful skin rash and fluid-filled blisters. Shingles is caused by the same virus that causes chickenpox. Shingles only develops in people who:  Have had chickenpox.  Have gotten the chickenpox vaccine. (This is rare.) The first symptoms of shingles may be itching, tingling, or pain in an area on your skin. A rash will follow in a few days or weeks. The rash is usually on one side of the body in a bandlike or beltlike pattern. Over time, the rash turns into fluid-filled blisters that break open, scab over, and dry up. Medicines may:  Help you manage pain.  Help you recover more quickly.  Help to prevent long-term problems. HOME CARE Medicines  Take medicines only as told by your doctor.  Apply an anti-itch or numbing cream to the affected area as told by your doctor. Blister and Rash Care  Take a cool bath or put cool compresses on the area of the rash or blisters as told by your doctor. This may help with pain and itching.  Keep your rash covered with a loose bandage (dressing). Wear loose-fitting clothing.  Keep your rash and blisters clean with mild soap and cool water or as told by your doctor.  Check your rash every day for signs of infection. These include redness, swelling, and pain that lasts or gets worse.  Do not pick your blisters.  Do not scratch your rash. General Instructions  Rest as told by your doctor.  Keep all follow-up visits as told by your doctor. This is important.  Until your blisters scab over, your infection can cause chickenpox in people who have never had it or been vaccinated against it. To prevent this from happening, avoid touching other people or being around other people, especially:  Babies.  Pregnant women.  Children who have eczema.  Elderly people who have transplants.  People who have chronic illnesses, such as leukemia or AIDS. GET HELP IF:  Your pain does not get better with  medicine.  Your pain does not get better after the rash heals.  Your rash looks infected. Signs of infection include:  Redness.  Swelling.  Pain that lasts or gets worse. GET HELP RIGHT AWAY IF:  The rash is on your face or nose.  You have pain in your face, pain around your eye area, or loss of feeling on one side of your face.  You have ear pain or you have ringing in your ear.  You have loss of taste.  Your condition gets worse.   This information is not intended to replace advice given to you by your health care provider. Make sure you discuss any questions you have with your health care provider.   Document Released: 05/21/2008 Document Revised: 12/24/2014 Document Reviewed: 09/14/2014 Elsevier Interactive Patient Education 2016 [employer]  

## 2016-01-11 ENCOUNTER — Emergency Department (INDEPENDENT_AMBULATORY_CARE_PROVIDER_SITE_OTHER)
Admission: EM | Admit: 2016-01-11 | Discharge: 2016-01-11 | Disposition: A | Discharge status: Home / Self Care | Payer: PPO | Source: Home / Self Care | Attending: Emergency Medicine | Admitting: Emergency Medicine

## 2016-01-11 ENCOUNTER — Encounter (HOSPITAL_COMMUNITY): Payer: Self-pay | Admitting: Emergency Medicine

## 2016-01-11 DIAGNOSIS — B029 Zoster without complications: Secondary | ICD-10-CM

## 2016-01-11 DIAGNOSIS — Z09 Encounter for follow-up examination after completed treatment for conditions other than malignant neoplasm: Secondary | ICD-10-CM | POA: Diagnosis not present
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
## 2016-01-11 NOTE — ED Notes (Signed)
Follow up to visit 1/17.  Patient reports feeling better

## 2016-01-11 NOTE — Discharge Instructions (Signed)
This shingles is healing well. The pain episodes should gradually improve over the next several weeks. You can try taking half of a pain pill if they're making you tired. Follow-up as needed.

## 2016-06-03 ENCOUNTER — Ambulatory Visit (HOSPITAL_COMMUNITY)
Admission: EM | Admit: 2016-06-03 | Discharge: 2016-06-03 | Disposition: A | Discharge status: Home / Self Care | Payer: PPO | Attending: Family Medicine | Admitting: Family Medicine

## 2016-06-03 ENCOUNTER — Encounter (HOSPITAL_COMMUNITY): Payer: Self-pay | Admitting: Emergency Medicine

## 2016-06-03 DIAGNOSIS — J0110 Acute frontal sinusitis, unspecified: Secondary | ICD-10-CM | POA: Diagnosis not present

## 2016-06-03 MED ORDER — DOXYCYCLINE HYCLATE 100 MG PO CAPS: 100.0000 mg | ORAL_CAPSULE | Freq: Two times a day (BID) | ORAL | Status: DC
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
## 2016-06-03 NOTE — Discharge Instructions (Signed)
Sinusitis, Adult  Sinusitis is redness, soreness, and puffiness (inflammation) of the air pockets in the bones of your face (sinuses). The redness, soreness, and puffiness can cause air and mucus to get trapped in your sinuses. This can allow germs to grow and cause an infection.   HOME CARE    Drink enough fluids to keep your pee (urine) clear or pale yellow.   Use a humidifier in your home.   Run a hot shower to create steam in the bathroom. Sit in the bathroom with the door closed. Breathe in the steam 3-4 times a day.   Put a warm, moist washcloth on your face 3-4 times a day, or as told by your doctor.   Use salt water sprays (saline sprays) to wet the thick fluid in your nose. This can help the sinuses drain.   Only take medicine as told by your doctor.  GET HELP RIGHT AWAY IF:    Your pain gets worse.   You have very bad headaches.   You are sick to your stomach (nauseous).   You throw up (vomit).   You are very sleepy (drowsy) all the time.   Your face is puffy (swollen).   Your vision changes.   You have a stiff neck.   You have trouble breathing.  MAKE SURE YOU:    Understand these instructions.   Will watch your condition.   Will get help right away if you are not doing well or get worse.     This information is not intended to replace advice given to you by your health care provider. Make sure you discuss any questions you have with your health care provider.     Document Released: 05/21/2008 Document Revised: 12/24/2014 Document Reviewed: 07/08/2012  Elsevier Interactive Patient Education 2016 [employer]

## 2016-06-03 NOTE — ED Notes (Signed)
PT reports body aches, intermittent fevers, sore throat, and a nonproductive cough since Thursday night.

## 2016-06-26 ENCOUNTER — Emergency Department (HOSPITAL_COMMUNITY): Payer: PPO

## 2016-06-26 ENCOUNTER — Emergency Department (HOSPITAL_COMMUNITY)
Admission: EM | Admit: 2016-06-26 | Discharge: 2016-06-26 | Disposition: A | Discharge status: Home / Self Care | Payer: PPO | Attending: Emergency Medicine | Admitting: Emergency Medicine

## 2016-06-26 ENCOUNTER — Encounter (HOSPITAL_COMMUNITY): Payer: Self-pay

## 2016-06-26 DIAGNOSIS — Z87891 Personal history of nicotine dependence: Secondary | ICD-10-CM | POA: Diagnosis not present

## 2016-06-26 DIAGNOSIS — Z79899 Other long term (current) drug therapy: Secondary | ICD-10-CM | POA: Diagnosis not present

## 2016-06-26 DIAGNOSIS — Y939 Activity, unspecified: Secondary | ICD-10-CM | POA: Insufficient documentation

## 2016-06-26 DIAGNOSIS — Z7982 Long term (current) use of aspirin: Secondary | ICD-10-CM | POA: Diagnosis not present

## 2016-06-26 DIAGNOSIS — R0789 Other chest pain: Secondary | ICD-10-CM | POA: Diagnosis not present

## 2016-06-26 DIAGNOSIS — R109 Unspecified abdominal pain: Secondary | ICD-10-CM | POA: Insufficient documentation

## 2016-06-26 DIAGNOSIS — S0990XA Unspecified injury of head, initial encounter: Secondary | ICD-10-CM | POA: Diagnosis not present

## 2016-06-26 DIAGNOSIS — S59911A Unspecified injury of right forearm, initial encounter: Secondary | ICD-10-CM | POA: Diagnosis present

## 2016-06-26 DIAGNOSIS — Y999 Unspecified external cause status: Secondary | ICD-10-CM | POA: Insufficient documentation

## 2016-06-26 DIAGNOSIS — R9431 Abnormal electrocardiogram [ECG] [EKG]: Secondary | ICD-10-CM | POA: Insufficient documentation

## 2016-06-26 DIAGNOSIS — I10 Essential (primary) hypertension: Secondary | ICD-10-CM | POA: Diagnosis not present

## 2016-06-26 DIAGNOSIS — S5011XA Contusion of right forearm, initial encounter: Secondary | ICD-10-CM

## 2016-06-26 DIAGNOSIS — Y92410 Unspecified street and highway as the place of occurrence of the external cause: Secondary | ICD-10-CM | POA: Diagnosis not present

## 2016-06-26 HISTORY — DX: Essential (primary) hypertension: I10

## 2016-06-26 HISTORY — DX: Age-related osteoporosis without current pathological fracture: M81.0

## 2016-06-26 LAB — URINALYSIS, ROUTINE W REFLEX MICROSCOPIC
Bilirubin Urine: NEGATIVE
Glucose, UA: NEGATIVE mg/dL
Ketones, ur: NEGATIVE mg/dL
Nitrite: NEGATIVE
PH: 6.5 (ref 5.0–8.0)
Protein, ur: NEGATIVE mg/dL
SPECIFIC GRAVITY, URINE: 1.022 (ref 1.005–1.030)

## 2016-06-26 LAB — COMPREHENSIVE METABOLIC PANEL
ALT: 15 U/L (ref 14–54)
ANION GAP: 10 (ref 5–15)
AST: 30 U/L (ref 15–41)
Albumin: 3.8 g/dL (ref 3.5–5.0)
Alkaline Phosphatase: 35 U/L — ABNORMAL LOW (ref 38–126)
BUN: 27 mg/dL — AB (ref 6–20)
CHLORIDE: 102 mmol/L (ref 101–111)
CO2: 26 mmol/L (ref 22–32)
Calcium: 8.6 mg/dL — ABNORMAL LOW (ref 8.9–10.3)
Creatinine, Ser: 1.28 mg/dL — ABNORMAL HIGH (ref 0.44–1.00)
GFR calc Af Amer: 49 mL/min — ABNORMAL LOW (ref >60)
GFR calc non Af Amer: 43 mL/min — ABNORMAL LOW (ref >60)
GLUCOSE: 93 mg/dL (ref 65–99)
Potassium: 3.8 mmol/L (ref 3.5–5.1)
SODIUM: 138 mmol/L (ref 135–145)
Total Bilirubin: 0.4 mg/dL (ref 0.3–1.2)
Total Protein: 6.3 g/dL — ABNORMAL LOW (ref 6.5–8.1)

## 2016-06-26 LAB — I-STAT TROPONIN, ED: TROPONIN I, POC: 0.01 ng/mL (ref 0.00–0.08)

## 2016-06-26 LAB — CDS SEROLOGY

## 2016-06-26 LAB — CBC
HEMATOCRIT: 39.9 % (ref 36.0–46.0)
HEMOGLOBIN: 13 g/dL (ref 12.0–15.0)
MCH: 29.7 pg (ref 26.0–34.0)
MCHC: 32.6 g/dL (ref 30.0–36.0)
MCV: 91.1 fL (ref 78.0–100.0)
Platelets: 237 10*3/uL (ref 150–400)
RBC: 4.38 MIL/uL (ref 3.87–5.11)
RDW: 13.8 % (ref 11.5–15.5)
WBC: 8.8 10*3/uL (ref 4.0–10.5)

## 2016-06-26 LAB — URINE MICROSCOPIC-ADD ON

## 2016-06-26 LAB — I-STAT CHEM 8, ED
BUN: 29 mg/dL — ABNORMAL HIGH (ref 6–20)
CALCIUM ION: 1 mmol/L — AB (ref 1.12–1.23)
CHLORIDE: 100 mmol/L — AB (ref 101–111)
CREATININE: 1.3 mg/dL — AB (ref 0.44–1.00)
GLUCOSE: 91 mg/dL (ref 65–99)
HCT: 40 % (ref 36.0–46.0)
Hemoglobin: 13.6 g/dL (ref 12.0–15.0)
POTASSIUM: 3.8 mmol/L (ref 3.5–5.1)
Sodium: 139 mmol/L (ref 135–145)
TCO2: 26 mmol/L (ref 0–100)

## 2016-06-26 LAB — I-STAT CG4 LACTIC ACID, ED: LACTIC ACID, VENOUS: 0.97 mmol/L (ref 0.5–1.9)

## 2016-06-26 LAB — RAPID URINE DRUG SCREEN, HOSP PERFORMED
AMPHETAMINES: NOT DETECTED
BENZODIAZEPINES: NOT DETECTED
Barbiturates: NOT DETECTED
Cocaine: NOT DETECTED
OPIATES: NOT DETECTED
TETRAHYDROCANNABINOL: NOT DETECTED

## 2016-06-26 LAB — SAMPLE TO BLOOD BANK

## 2016-06-26 LAB — PROTIME-INR
INR: 1.05 (ref 0.00–1.49)
Prothrombin Time: 13.9 seconds (ref 11.6–15.2)

## 2016-06-26 LAB — ETHANOL: Alcohol, Ethyl (B): <5 mg/dL (ref <5)

## 2016-06-26 MED ORDER — METHOCARBAMOL 500 MG PO TABS: 1000.0000 mg | ORAL_TABLET | Freq: Three times a day (TID) | ORAL | Status: DC | PRN

## 2016-06-26 MED ORDER — FENTANYL CITRATE (PF) 100 MCG/2ML IJ SOLN
50.0000 ug | Freq: Once | INTRAMUSCULAR | Status: AC
  Administered 2016-06-26: 50 ug via INTRAVENOUS
  Filled 2016-06-26: qty 2

## 2016-06-26 MED ORDER — CEPHALEXIN 500 MG PO CAPS: 500.0000 mg | ORAL_CAPSULE | Freq: Two times a day (BID) | ORAL | Status: DC

## 2016-06-26 MED ORDER — SODIUM CHLORIDE 0.9 % IV BOLUS (SEPSIS)
500.0000 mL | Freq: Once | INTRAVENOUS | Status: AC
  Administered 2016-06-26: 500 mL via INTRAVENOUS

## 2016-06-26 MED ORDER — IOPAMIDOL (ISOVUE-300) INJECTION 61%
INTRAVENOUS | Status: AC
  Administered 2016-06-26: 100 mL
  Filled 2016-06-26: qty 100

## 2016-06-26 MED ORDER — HYDROCODONE-ACETAMINOPHEN 5-325 MG PO TABS: 1.0000 | ORAL_TABLET | Freq: Four times a day (QID) | ORAL | Status: DC | PRN
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
## 2016-06-26 NOTE — ED Notes (Signed)
Repeat EKG given to Dr. Lita Mains

## 2016-07-04 ENCOUNTER — Encounter: Payer: Self-pay | Admitting: Cardiovascular Disease

## 2016-07-04 ENCOUNTER — Ambulatory Visit (INDEPENDENT_AMBULATORY_CARE_PROVIDER_SITE_OTHER): Payer: PPO | Admitting: Cardiovascular Disease

## 2016-07-04 VITALS — BP 140/100 | HR 60 | Ht 62.0 in | Wt 129.8 lb

## 2016-07-04 DIAGNOSIS — Q249 Congenital malformation of heart, unspecified: Secondary | ICD-10-CM | POA: Diagnosis not present

## 2016-07-04 DIAGNOSIS — I10 Essential (primary) hypertension: Secondary | ICD-10-CM

## 2016-07-04 MED ORDER — LOSARTAN POTASSIUM 50 MG PO TABS: 50.0000 mg | ORAL_TABLET | Freq: Every day | ORAL | Status: DC
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
## 2016-07-04 NOTE — Patient Instructions (Signed)
Medication Instructions:  START Losartan 50 mg once daily   Labwork: Your physician recommends that you return for lab work in: 3 weeks for basic metabolic panel   Testing/Procedures: None Ordered   Follow-Up: Your physician has recommended that you follow-up with Dr. Dorris Carnes.  We will send a message to her for approval and someone from our office will contact you regarding your future appointment.   If you need a refill on your cardiac medications before your next appointment, please call your pharmacy.   Thank you for choosing [facility]! Christen Bame, RN [phone]

## 2016-07-09 ENCOUNTER — Encounter (HOSPITAL_COMMUNITY): Payer: Self-pay | Admitting: Emergency Medicine

## 2016-07-09 ENCOUNTER — Telehealth: Payer: Self-pay | Admitting: Cardiovascular Disease

## 2016-07-09 ENCOUNTER — Ambulatory Visit (HOSPITAL_COMMUNITY)
Admission: EM | Admit: 2016-07-09 | Discharge: 2016-07-09 | Disposition: A | Discharge status: Home / Self Care | Payer: PPO

## 2016-07-09 DIAGNOSIS — M79605 Pain in left leg: Secondary | ICD-10-CM

## 2016-07-09 DIAGNOSIS — M7989 Other specified soft tissue disorders: Secondary | ICD-10-CM
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
## 2016-07-09 NOTE — Telephone Encounter (Signed)
I attempted to contact patient at [phone], was told I had the wrong person and phone number.

## 2016-07-09 NOTE — Telephone Encounter (Signed)
New Message  Pt call requesting to RN about yellow color bruise on left leg. Please call back to discuss
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
## 2016-07-11 ENCOUNTER — Telehealth: Payer: Self-pay | Admitting: *Deleted

## 2016-07-11 DIAGNOSIS — Z8774 Personal history of (corrected) congenital malformations of heart and circulatory system: Secondary | ICD-10-CM

## 2016-07-11 NOTE — Telephone Encounter (Signed)
Fay Records, MD  Thayer Headings, MD  Cc: Rodman Key, RN        Sure.  She needs to have another echo sched I will see about scheduling in the next few wks    Previous Messages    ----- Message -----   From: Thayer Headings, MD   Sent: 07/04/2016  9:41 AM    To: Fay Records, MD, Rodman Key, RN   Michelle [name],  Would you consider seeing and following this patient with hx of Tet repair.  She has severe PI and TR.  No significant dyspnea at this point  Thanks   Phil      Echo order placed.  Left message for patient to call back to inform her.

## 2016-07-16 ENCOUNTER — Other Ambulatory Visit (HOSPITAL_COMMUNITY): Payer: Self-pay | Admitting: Primary Care

## 2016-07-17 ENCOUNTER — Ambulatory Visit (HOSPITAL_COMMUNITY)
Admission: RE | Admit: 2016-07-17 | Discharge: 2016-07-17 | Disposition: A | Discharge status: Home / Self Care | Payer: PPO | Source: Ambulatory Visit | Attending: Primary Care | Admitting: Primary Care

## 2016-07-17 ENCOUNTER — Other Ambulatory Visit (HOSPITAL_COMMUNITY): Payer: Self-pay | Admitting: Primary Care

## 2016-07-17 DIAGNOSIS — M79662 Pain in left lower leg: Secondary | ICD-10-CM

## 2016-07-17 DIAGNOSIS — M79605 Pain in left leg: Secondary | ICD-10-CM

## 2016-07-17 DIAGNOSIS — M7989 Other specified soft tissue disorders: Secondary | ICD-10-CM | POA: Diagnosis not present

## 2016-07-17 NOTE — Progress Notes (Signed)
VASCULAR LAB PRELIMINARY  PRELIMINARY  PRELIMINARY  PRELIMINARY  Left lower extremity venous duplex has been completed.     Left:  No evidence of DVT, superficial thrombosis, or Baker's cyst.  Called Dr. Percell Miller spoke with his nurse gave her the results.  Bryler Dibble, RVT, RDMS 07/17/2016, 1:43 PM

## 2016-07-23 ENCOUNTER — Ambulatory Visit (HOSPITAL_COMMUNITY): Payer: PPO | Attending: Cardiovascular Disease

## 2016-07-23 ENCOUNTER — Other Ambulatory Visit: Payer: Self-pay

## 2016-07-23 DIAGNOSIS — Z9889 Other specified postprocedural states: Secondary | ICD-10-CM | POA: Diagnosis present

## 2016-07-23 DIAGNOSIS — Z8774 Personal history of (corrected) congenital malformations of heart and circulatory system: Secondary | ICD-10-CM | POA: Insufficient documentation

## 2016-07-23 DIAGNOSIS — E785 Hyperlipidemia, unspecified: Secondary | ICD-10-CM | POA: Diagnosis not present

## 2016-07-23 DIAGNOSIS — I340 Nonrheumatic mitral (valve) insufficiency: Secondary | ICD-10-CM | POA: Diagnosis not present

## 2016-07-23 DIAGNOSIS — I341 Nonrheumatic mitral (valve) prolapse: Secondary | ICD-10-CM | POA: Insufficient documentation

## 2016-07-23 DIAGNOSIS — I517 Cardiomegaly: Secondary | ICD-10-CM | POA: Diagnosis not present

## 2016-07-23 DIAGNOSIS — I351 Nonrheumatic aortic (valve) insufficiency: Secondary | ICD-10-CM | POA: Diagnosis not present

## 2016-07-23 DIAGNOSIS — I371 Nonrheumatic pulmonary valve insufficiency: Secondary | ICD-10-CM | POA: Insufficient documentation

## 2016-07-23 DIAGNOSIS — I071 Rheumatic tricuspid insufficiency: Secondary | ICD-10-CM | POA: Insufficient documentation

## 2016-07-25 ENCOUNTER — Other Ambulatory Visit (INDEPENDENT_AMBULATORY_CARE_PROVIDER_SITE_OTHER): Payer: PPO

## 2016-07-25 DIAGNOSIS — I10 Essential (primary) hypertension: Secondary | ICD-10-CM | POA: Diagnosis not present

## 2016-07-25 LAB — BASIC METABOLIC PANEL
BUN: 23 mg/dL (ref 7–25)
CALCIUM: 9 mg/dL (ref 8.6–10.4)
CO2: 25 mmol/L (ref 20–31)
Chloride: 103 mmol/L (ref 98–110)
Creat: 1.11 mg/dL — ABNORMAL HIGH (ref 0.50–0.99)
GLUCOSE: 66 mg/dL (ref 65–99)
Potassium: 5.2 mmol/L (ref 3.5–5.3)
SODIUM: 137 mmol/L (ref 135–146)

## 2016-07-30 ENCOUNTER — Encounter: Payer: Self-pay | Admitting: Internal Medicine

## 2016-07-30 ENCOUNTER — Other Ambulatory Visit (INDEPENDENT_AMBULATORY_CARE_PROVIDER_SITE_OTHER): Payer: PPO

## 2016-07-30 ENCOUNTER — Ambulatory Visit (INDEPENDENT_AMBULATORY_CARE_PROVIDER_SITE_OTHER): Payer: PPO | Admitting: Internal Medicine

## 2016-07-30 VITALS — BP 140/88 | HR 69 | Temp 97.8°F | Resp 16 | Ht 62.0 in | Wt 132.5 lb

## 2016-07-30 DIAGNOSIS — I10 Essential (primary) hypertension: Secondary | ICD-10-CM

## 2016-07-30 DIAGNOSIS — Z0000 Encounter for general adult medical examination without abnormal findings: Secondary | ICD-10-CM

## 2016-07-30 DIAGNOSIS — E785 Hyperlipidemia, unspecified: Secondary | ICD-10-CM

## 2016-07-30 DIAGNOSIS — M810 Age-related osteoporosis without current pathological fracture: Secondary | ICD-10-CM | POA: Insufficient documentation

## 2016-07-30 DIAGNOSIS — Q249 Congenital malformation of heart, unspecified: Secondary | ICD-10-CM | POA: Diagnosis not present

## 2016-07-30 DIAGNOSIS — Z23 Encounter for immunization: Secondary | ICD-10-CM

## 2016-07-30 DIAGNOSIS — Q828 Other specified congenital malformations of skin: Secondary | ICD-10-CM | POA: Diagnosis not present

## 2016-07-30 DIAGNOSIS — Z124 Encounter for screening for malignant neoplasm of cervix: Secondary | ICD-10-CM | POA: Insufficient documentation

## 2016-07-30 DIAGNOSIS — Z1231 Encounter for screening mammogram for malignant neoplasm of breast: Secondary | ICD-10-CM

## 2016-07-30 LAB — URINALYSIS, ROUTINE W REFLEX MICROSCOPIC
Bilirubin Urine: NEGATIVE
KETONES UR: NEGATIVE
Nitrite: NEGATIVE
PH: 6 (ref 5.0–8.0)
Total Protein, Urine: NEGATIVE
URINE GLUCOSE: NEGATIVE
UROBILINOGEN UA: 0.2 (ref 0.0–1.0)

## 2016-07-30 LAB — LIPID PANEL
CHOL/HDL RATIO: 4
Cholesterol: 190 mg/dL (ref 0–200)
HDL: 48.7 mg/dL (ref >39.00)
LDL CALC: 118 mg/dL — AB (ref 0–99)
NONHDL: 141.65
TRIGLYCERIDES: 119 mg/dL (ref 0.0–149.0)
VLDL: 23.8 mg/dL (ref 0.0–40.0)

## 2016-07-30 LAB — TSH: TSH: 5.52 u[IU]/mL — AB (ref 0.35–4.50)
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
## 2016-07-30 NOTE — Progress Notes (Signed)
Pre visit review using our clinic review tool, if applicable. No additional management support is needed unless otherwise documented below in the visit note. 

## 2016-07-30 NOTE — Patient Instructions (Signed)
Preventive Care for Adults, Female A healthy lifestyle and preventive care can promote health and wellness. Preventive health guidelines for women include the following key practices.  A routine yearly physical is a good way to check with your health care provider about your health and preventive screening. It is a chance to share any concerns and updates on your health and to receive a thorough exam.  Visit your dentist for a routine exam and preventive care every 6 months. Brush your teeth twice a day and floss once a day. Good oral hygiene prevents tooth decay and gum disease.  The frequency of eye exams is based on your age, health, family medical history, use of contact lenses, and other factors. Follow your health care provider's recommendations for frequency of eye exams.  Eat a healthy diet. Foods like vegetables, fruits, whole grains, low-fat dairy products, and lean protein foods contain the nutrients you need without too many calories. Decrease your intake of foods high in solid fats, added sugars, and salt. Eat the right amount of calories for you.Get information about a proper diet from your health care provider, if necessary.  Regular physical exercise is one of the most important things you can do for your health. Most adults should get at least 150 minutes of moderate-intensity exercise (any activity that increases your heart rate and causes you to sweat) each week. In addition, most adults need muscle-strengthening exercises on 2 or more days a week.  Maintain a healthy weight. The body mass index (BMI) is a screening tool to identify possible weight problems. It provides an estimate of body fat based on height and weight. Your health care provider can find your BMI and can help you achieve or maintain a healthy weight.For adults 20 years and older:  A BMI below 18.5 is considered underweight.  A BMI of 18.5 to 24.9 is normal.  A BMI of 25 to 29.9 is considered overweight.  A  BMI of 30 and above is considered obese.  Maintain normal blood lipids and cholesterol levels by exercising and minimizing your intake of saturated fat. Eat a balanced diet with plenty of fruit and vegetables. Blood tests for lipids and cholesterol should begin at age 45 and be repeated every 5 years. If your lipid or cholesterol levels are high, you are over 50, or you are at high risk for heart disease, you may need your cholesterol levels checked more frequently.Ongoing high lipid and cholesterol levels should be treated with medicines if diet and exercise are not working.  If you smoke, find out from your health care provider how to quit. If you do not use tobacco, do not start.  Lung cancer screening is recommended for adults aged 45-80 years who are at high risk for developing lung cancer because of a history of smoking. A yearly low-dose CT scan of the lungs is recommended for people who have at least a 30-pack-year history of smoking and are a current smoker or have quit within the past 15 years. A pack year of smoking is smoking an average of 1 pack of cigarettes a day for 1 year (for example: 1 pack a day for 30 years or 2 packs a day for 15 years). Yearly screening should continue until the smoker has stopped smoking for at least 15 years. Yearly screening should be stopped for people who develop a health problem that would prevent them from having lung cancer treatment.  If you are pregnant, do not drink alcohol. If you are  breastfeeding, be very cautious about drinking alcohol. If you are not pregnant and choose to drink alcohol, do not have more than 1 drink per day. One drink is considered to be 12 ounces (355 mL) of beer, 5 ounces (148 mL) of wine, or 1.5 ounces (44 mL) of liquor.  Avoid use of street drugs. Do not share needles with anyone. Ask for help if you need support or instructions about stopping the use of drugs.  High blood pressure causes heart disease and increases the risk  of stroke. Your blood pressure should be checked at least every 1 to 2 years. Ongoing high blood pressure should be treated with medicines if weight loss and exercise do not work.  If you are 55-79 years old, ask your health care provider if you should take aspirin to prevent strokes.  Diabetes screening is done by taking a blood sample to check your blood glucose level after you have not eaten for a certain period of time (fasting). If you are not overweight and you do not have risk factors for diabetes, you should be screened once every 3 years starting at age 45. If you are overweight or obese and you are 40-70 years of age, you should be screened for diabetes every year as part of your cardiovascular risk assessment.  Breast cancer screening is essential preventive care for women. You should practice "breast self-awareness." This means understanding the normal appearance and feel of your breasts and may include breast self-examination. Any changes detected, no matter how small, should be reported to a health care provider. Women in their 20s and 30s should have a clinical breast exam (CBE) by a health care provider as part of a regular health exam every 1 to 3 years. After age 40, women should have a CBE every year. Starting at age 40, women should consider having a mammogram (breast X-ray test) every year. Women who have a family history of breast cancer should talk to their health care provider about genetic screening. Women at a high risk of breast cancer should talk to their health care providers about having an MRI and a mammogram every year.  Breast cancer gene (BRCA)-related cancer risk assessment is recommended for women who have family members with BRCA-related cancers. BRCA-related cancers include breast, ovarian, tubal, and peritoneal cancers. Having family members with these cancers may be associated with an increased risk for harmful changes (mutations) in the breast cancer genes BRCA1 and  BRCA2. Results of the assessment will determine the need for genetic counseling and BRCA1 and BRCA2 testing.  Your health care provider may recommend that you be screened regularly for cancer of the pelvic organs (ovaries, uterus, and vagina). This screening involves a pelvic examination, including checking for microscopic changes to the surface of your cervix (Pap test). You may be encouraged to have this screening done every 3 years, beginning at age 21.  For women ages 30-65, health care providers may recommend pelvic exams and Pap testing every 3 years, or they may recommend the Pap and pelvic exam, combined with testing for human papilloma virus (HPV), every 5 years. Some types of HPV increase your risk of cervical cancer. Testing for HPV may also be done on women of any age with unclear Pap test results.  Other health care providers may not recommend any screening for nonpregnant women who are considered low risk for pelvic cancer and who do not have symptoms. Ask your health care provider if a screening pelvic exam is right for   you.  If you have had past treatment for cervical cancer or a condition that could lead to cancer, you need Pap tests and screening for cancer for at least 20 years after your treatment. If Pap tests have been discontinued, your risk factors (such as having a new sexual partner) need to be reassessed to determine if screening should resume. Some women have medical problems that increase the chance of getting cervical cancer. In these cases, your health care provider may recommend more frequent screening and Pap tests.  Colorectal cancer can be detected and often prevented. Most routine colorectal cancer screening begins at the age of 50 years and continues through age 75 years. However, your health care provider may recommend screening at an earlier age if you have risk factors for colon cancer. On a yearly basis, your health care provider may provide home test kits to check  for hidden blood in the stool. Use of a small camera at the end of a tube, to directly examine the colon (sigmoidoscopy or colonoscopy), can detect the earliest forms of colorectal cancer. Talk to your health care provider about this at age 50, when routine screening begins. Direct exam of the colon should be repeated every 5-10 years through age 75 years, unless early forms of precancerous polyps or small growths are found.  People who are at an increased risk for hepatitis B should be screened for this virus. You are considered at high risk for hepatitis B if:  You were born in a country where hepatitis B occurs often. Talk with your health care provider about which countries are considered high risk.  Your parents were born in a high-risk country and you have not received a shot to protect against hepatitis B (hepatitis B vaccine).  You have HIV or AIDS.  You use needles to inject street drugs.  You live with, or have sex with, someone who has hepatitis B.  You get hemodialysis treatment.  You take certain medicines for conditions like cancer, organ transplantation, and autoimmune conditions.  Hepatitis C blood testing is recommended for all people born from 1945 through 1965 and any individual with known risks for hepatitis C.  Practice safe sex. Use condoms and avoid high-risk sexual practices to reduce the spread of sexually transmitted infections (STIs). STIs include gonorrhea, chlamydia, syphilis, trichomonas, herpes, HPV, and human immunodeficiency virus (HIV). Herpes, HIV, and HPV are viral illnesses that have no cure. They can result in disability, cancer, and death.  You should be screened for sexually transmitted illnesses (STIs) including gonorrhea and chlamydia if:  You are sexually active and are younger than 24 years.  You are older than 24 years and your health care provider tells you that you are at risk for this type of infection.  Your sexual activity has changed  since you were last screened and you are at an increased risk for chlamydia or gonorrhea. Ask your health care provider if you are at risk.  If you are at risk of being infected with HIV, it is recommended that you take a prescription medicine daily to prevent HIV infection. This is called preexposure prophylaxis (PrEP). You are considered at risk if:  You are sexually active and do not regularly use condoms or know the HIV status of your partner(s).  You take drugs by injection.  You are sexually active with a partner who has HIV.  Talk with your health care provider about whether you are at high risk of being infected with HIV. If   you choose to begin PrEP, you should first be tested for HIV. You should then be tested every 3 months for as long as you are taking PrEP.  Osteoporosis is a disease in which the bones lose minerals and strength with aging. This can result in serious bone fractures or breaks. The risk of osteoporosis can be identified using a bone density scan. Women ages 67 years and over and women at risk for fractures or osteoporosis should discuss screening with their health care providers. Ask your health care provider whether you should take a calcium supplement or vitamin D to reduce the rate of osteoporosis.  Menopause can be associated with physical symptoms and risks. Hormone replacement therapy is available to decrease symptoms and risks. You should talk to your health care provider about whether hormone replacement therapy is right for you.  Use sunscreen. Apply sunscreen liberally and repeatedly throughout the day. You should seek shade when your shadow is shorter than you. Protect yourself by wearing long sleeves, pants, a wide-brimmed hat, and sunglasses year round, whenever you are outdoors.  Once a month, do a whole body skin exam, using a mirror to look at the skin on your back. Tell your health care provider of new moles, moles that have irregular borders, moles that  are larger than a pencil eraser, or moles that have changed in shape or color.  Stay current with required vaccines (immunizations).  Influenza vaccine. All adults should be immunized every year.  Tetanus, diphtheria, and acellular pertussis (Td, Tdap) vaccine. Pregnant women should receive 1 dose of Tdap vaccine during each pregnancy. The dose should be obtained regardless of the length of time since the last dose. Immunization is preferred during the 27th-36th week of gestation. An adult who has not previously received Tdap or who does not know her vaccine status should receive 1 dose of Tdap. This initial dose should be followed by tetanus and diphtheria toxoids (Td) booster doses every 10 years. Adults with an unknown or incomplete history of completing a 3-dose immunization series with Td-containing vaccines should begin or complete a primary immunization series including a Tdap dose. Adults should receive a Td booster every 10 years.  Varicella vaccine. An adult without evidence of immunity to varicella should receive 2 doses or a second dose if she has previously received 1 dose. Pregnant females who do not have evidence of immunity should receive the first dose after pregnancy. This first dose should be obtained before leaving the health care facility. The second dose should be obtained 4-8 weeks after the first dose.  Human papillomavirus (HPV) vaccine. Females aged 13-26 years who have not received the vaccine previously should obtain the 3-dose series. The vaccine is not recommended for use in pregnant females. However, pregnancy testing is not needed before receiving a dose. If a female is found to be pregnant after receiving a dose, no treatment is needed. In that case, the remaining doses should be delayed until after the pregnancy. Immunization is recommended for any person with an immunocompromised condition through the age of 61 years if she did not get any or all doses earlier. During the  3-dose series, the second dose should be obtained 4-8 weeks after the first dose. The third dose should be obtained 24 weeks after the first dose and 16 weeks after the second dose.  Zoster vaccine. One dose is recommended for adults aged 30 years or older unless certain conditions are present.  Measles, mumps, and rubella (MMR) vaccine. Adults born  before 1957 generally are considered immune to measles and mumps. Adults born in 1957 or later should have 1 or more doses of MMR vaccine unless there is a contraindication to the vaccine or there is laboratory evidence of immunity to each of the three diseases. A routine second dose of MMR vaccine should be obtained at least 28 days after the first dose for students attending postsecondary schools, health care workers, or international travelers. People who received inactivated measles vaccine or an unknown type of measles vaccine during 1963-1967 should receive 2 doses of MMR vaccine. People who received inactivated mumps vaccine or an unknown type of mumps vaccine before 1979 and are at high risk for mumps infection should consider immunization with 2 doses of MMR vaccine. For females of childbearing age, rubella immunity should be determined. If there is no evidence of immunity, females who are not pregnant should be vaccinated. If there is no evidence of immunity, females who are pregnant should delay immunization until after pregnancy. Unvaccinated health care workers born before 1957 who lack laboratory evidence of measles, mumps, or rubella immunity or laboratory confirmation of disease should consider measles and mumps immunization with 2 doses of MMR vaccine or rubella immunization with 1 dose of MMR vaccine.  Pneumococcal 13-valent conjugate (PCV13) vaccine. When indicated, a person who is uncertain of his immunization history and has no record of immunization should receive the PCV13 vaccine. All adults 65 years of age and older should receive this  vaccine. An adult aged 19 years or older who has certain medical conditions and has not been previously immunized should receive 1 dose of PCV13 vaccine. This PCV13 should be followed with a dose of pneumococcal polysaccharide (PPSV23) vaccine. Adults who are at high risk for pneumococcal disease should obtain the PPSV23 vaccine at least 8 weeks after the dose of PCV13 vaccine. Adults older than 65 years of age who have normal immune system function should obtain the PPSV23 vaccine dose at least 1 year after the dose of PCV13 vaccine.  Pneumococcal polysaccharide (PPSV23) vaccine. When PCV13 is also indicated, PCV13 should be obtained first. All adults aged 65 years and older should be immunized. An adult younger than age 65 years who has certain medical conditions should be immunized. Any person who resides in a nursing home or long-term care facility should be immunized. An adult smoker should be immunized. People with an immunocompromised condition and certain other conditions should receive both PCV13 and PPSV23 vaccines. People with human immunodeficiency virus (HIV) infection should be immunized as soon as possible after diagnosis. Immunization during chemotherapy or radiation therapy should be avoided. Routine use of PPSV23 vaccine is not recommended for American Indians, [place] Natives, or people younger than 65 years unless there are medical conditions that require PPSV23 vaccine. When indicated, people who have unknown immunization and have no record of immunization should receive PPSV23 vaccine. One-time revaccination 5 years after the first dose of PPSV23 is recommended for people aged 19-64 years who have chronic kidney failure, nephrotic syndrome, asplenia, or immunocompromised conditions. People who received 1-2 doses of PPSV23 before age 65 years should receive another dose of PPSV23 vaccine at age 65 years or later if at least 5 years have passed since the previous dose. Doses of PPSV23 are not  needed for people immunized with PPSV23 at or after age 65 years.  Meningococcal vaccine. Adults with asplenia or persistent complement component deficiencies should receive 2 doses of quadrivalent meningococcal conjugate (MenACWY-D) vaccine. The doses should be obtained   at least 2 months apart. Microbiologists working with certain meningococcal bacteria, [place] recruits, people at risk during an outbreak, and people who travel to or live in countries with a high rate of meningitis should be immunized. A first-year college student up through age 34 years who is living in a residence hall should receive a dose if she did not receive a dose on or after her 16th birthday. Adults who have certain high-risk conditions should receive one or more doses of vaccine.  Hepatitis A vaccine. Adults who wish to be protected from this disease, have certain high-risk conditions, work with hepatitis A-infected animals, work in hepatitis A research labs, or travel to or work in countries with a high rate of hepatitis A should be immunized. Adults who were previously unvaccinated and who anticipate close contact with an international adoptee during the first 60 days after arrival in the [place] from a country with a high rate of hepatitis A should be immunized.  Hepatitis B vaccine. Adults who wish to be protected from this disease, have certain high-risk conditions, may be exposed to blood or other infectious body fluids, are household contacts or sex partners of hepatitis B positive people, are clients or workers in certain care facilities, or travel to or work in countries with a high rate of hepatitis B should be immunized.  Haemophilus influenzae type b (Hib) vaccine. A previously unvaccinated person with asplenia or sickle cell disease or having a scheduled splenectomy should receive 1 dose of Hib vaccine. Regardless of previous immunization, a recipient of a hematopoietic stem cell transplant should receive a  3-dose series 6-12 months after her successful transplant. Hib vaccine is not recommended for adults with HIV infection. Preventive Services / Frequency Ages 35 to 4 years  Blood pressure check.** / Every 3-5 years.  Lipid and cholesterol check.** / Every 5 years beginning at age 60.  Clinical breast exam.** / Every 3 years for women in their 71s and 10s.  BRCA-related cancer risk assessment.** / For women who have family members with a BRCA-related cancer (breast, ovarian, tubal, or peritoneal cancers).  Pap test.** / Every 2 years from ages 76 through 26. Every 3 years starting at age 61 through age 76 or 93 with a history of 3 consecutive normal Pap tests.  HPV screening.** / Every 3 years from ages 37 through ages 60 to 51 with a history of 3 consecutive normal Pap tests.  Hepatitis C blood test.** / For any individual with known risks for hepatitis C.  Skin self-exam. / Monthly.  Influenza vaccine. / Every year.  Tetanus, diphtheria, and acellular pertussis (Tdap, Td) vaccine.** / Consult your health care provider. Pregnant women should receive 1 dose of Tdap vaccine during each pregnancy. 1 dose of Td every 10 years.  Varicella vaccine.** / Consult your health care provider. Pregnant females who do not have evidence of immunity should receive the first dose after pregnancy.  HPV vaccine. / 3 doses over 6 months, if 93 and younger. The vaccine is not recommended for use in pregnant females. However, pregnancy testing is not needed before receiving a dose.  Measles, mumps, rubella (MMR) vaccine.** / You need at least 1 dose of MMR if you were born in 1957 or later. You may also need a 2nd dose. For females of childbearing age, rubella immunity should be determined. If there is no evidence of immunity, females who are not pregnant should be vaccinated. If there is no evidence of immunity, females who are  pregnant should delay immunization until after pregnancy.  Pneumococcal  13-valent conjugate (PCV13) vaccine.** / Consult your health care provider.  Pneumococcal polysaccharide (PPSV23) vaccine.** / 1 to 2 doses if you smoke cigarettes or if you have certain conditions.  Meningococcal vaccine.** / 1 dose if you are age 68 to 8 years and a Market researcher living in a residence hall, or have one of several medical conditions, you need to get vaccinated against meningococcal disease. You may also need additional booster doses.  Hepatitis A vaccine.** / Consult your health care provider.  Hepatitis B vaccine.** / Consult your health care provider.  Haemophilus influenzae type b (Hib) vaccine.** / Consult your health care provider. Ages 7 to 53 years  Blood pressure check.** / Every year.  Lipid and cholesterol check.** / Every 5 years beginning at age 25 years.  Lung cancer screening. / Every year if you are aged 11-80 years and have a 30-pack-year history of smoking and currently smoke or have quit within the past 15 years. Yearly screening is stopped once you have quit smoking for at least 15 years or develop a health problem that would prevent you from having lung cancer treatment.  Clinical breast exam.** / Every year after age 48 years.  BRCA-related cancer risk assessment.** / For women who have family members with a BRCA-related cancer (breast, ovarian, tubal, or peritoneal cancers).  Mammogram.** / Every year beginning at age 41 years and continuing for as long as you are in good health. Consult with your health care provider.  Pap test.** / Every 3 years starting at age 65 years through age 37 or 70 years with a history of 3 consecutive normal Pap tests.  HPV screening.** / Every 3 years from ages 72 years through ages 60 to 40 years with a history of 3 consecutive normal Pap tests.  Fecal occult blood test (FOBT) of stool. / Every year beginning at age 21 years and continuing until age 5 years. You may not need to do this test if you get  a colonoscopy every 10 years.  Flexible sigmoidoscopy or colonoscopy.** / Every 5 years for a flexible sigmoidoscopy or every 10 years for a colonoscopy beginning at age 35 years and continuing until age 48 years.  Hepatitis C blood test.** / For all people born from [birth year] through 1965 and any individual with known risks for hepatitis C.  Skin self-exam. / Monthly.  Influenza vaccine. / Every year.  Tetanus, diphtheria, and acellular pertussis (Tdap/Td) vaccine.** / Consult your health care provider. Pregnant women should receive 1 dose of Tdap vaccine during each pregnancy. 1 dose of Td every 10 years.  Varicella vaccine.** / Consult your health care provider. Pregnant females who do not have evidence of immunity should receive the first dose after pregnancy.  Zoster vaccine.** / 1 dose for adults aged 30 years or older.  Measles, mumps, rubella (MMR) vaccine.** / You need at least 1 dose of MMR if you were born in 1957 or later. You may also need a second dose. For females of childbearing age, rubella immunity should be determined. If there is no evidence of immunity, females who are not pregnant should be vaccinated. If there is no evidence of immunity, females who are pregnant should delay immunization until after pregnancy.  Pneumococcal 13-valent conjugate (PCV13) vaccine.** / Consult your health care provider.  Pneumococcal polysaccharide (PPSV23) vaccine.** / 1 to 2 doses if you smoke cigarettes or if you have certain conditions.  Meningococcal vaccine.** /  Consult your health care provider.  Hepatitis A vaccine.** / Consult your health care provider.  Hepatitis B vaccine.** / Consult your health care provider.  Haemophilus influenzae type b (Hib) vaccine.** / Consult your health care provider. Ages 64 years and over  Blood pressure check.** / Every year.  Lipid and cholesterol check.** / Every 5 years beginning at age 23 years.  Lung cancer screening. / Every year if you  are aged 16-80 years and have a 30-pack-year history of smoking and currently smoke or have quit within the past 15 years. Yearly screening is stopped once you have quit smoking for at least 15 years or develop a health problem that would prevent you from having lung cancer treatment.  Clinical breast exam.** / Every year after age 74 years.  BRCA-related cancer risk assessment.** / For women who have family members with a BRCA-related cancer (breast, ovarian, tubal, or peritoneal cancers).  Mammogram.** / Every year beginning at age 44 years and continuing for as long as you are in good health. Consult with your health care provider.  Pap test.** / Every 3 years starting at age 58 years through age 22 or 39 years with 3 consecutive normal Pap tests. Testing can be stopped between 65 and 70 years with 3 consecutive normal Pap tests and no abnormal Pap or HPV tests in the past 10 years.  HPV screening.** / Every 3 years from ages 64 years through ages 70 or 61 years with a history of 3 consecutive normal Pap tests. Testing can be stopped between 65 and 70 years with 3 consecutive normal Pap tests and no abnormal Pap or HPV tests in the past 10 years.  Fecal occult blood test (FOBT) of stool. / Every year beginning at age 40 years and continuing until age 27 years. You may not need to do this test if you get a colonoscopy every 10 years.  Flexible sigmoidoscopy or colonoscopy.** / Every 5 years for a flexible sigmoidoscopy or every 10 years for a colonoscopy beginning at age 7 years and continuing until age 32 years.  Hepatitis C blood test.** / For all people born from [birth year] through 1966 and any individual with known risks for hepatitis C.  Osteoporosis screening.** / A one-time screening for women ages 30 years and over and women at risk for fractures or osteoporosis.  Skin self-exam. / Monthly.  Influenza vaccine. / Every year.  Tetanus, diphtheria, and acellular pertussis (Tdap/Td)  vaccine.** / 1 dose of Td every 10 years.  Varicella vaccine.** / Consult your health care provider.  Zoster vaccine.** / 1 dose for adults aged 35 years or older.  Pneumococcal 13-valent conjugate (PCV13) vaccine.** / Consult your health care provider.  Pneumococcal polysaccharide (PPSV23) vaccine.** / 1 dose for all adults aged 46 years and older.  Meningococcal vaccine.** / Consult your health care provider.  Hepatitis A vaccine.** / Consult your health care provider.  Hepatitis B vaccine.** / Consult your health care provider.  Haemophilus influenzae type b (Hib) vaccine.** / Consult your health care provider. ** Family history and personal history of risk and conditions may change your health care provider's recommendations.   This information is not intended to replace advice given to you by your health care provider. Make sure you discuss any questions you have with your health care provider.   Document Released: 01/29/2002 Document Revised: 12/24/2014 Document Reviewed: 04/30/2011 Elsevier Interactive Patient Education [employer].

## 2016-07-31 ENCOUNTER — Telehealth: Payer: Self-pay | Admitting: *Deleted

## 2016-07-31 ENCOUNTER — Encounter: Payer: Self-pay | Admitting: Internal Medicine

## 2016-07-31 DIAGNOSIS — Q249 Congenital malformation of heart, unspecified: Secondary | ICD-10-CM

## 2016-07-31 NOTE — Telephone Encounter (Signed)
Notes Recorded by Fay Records, MD on 07/30/2016 at 9:50 PM EDT I have reviewed Echo Pumping function of L side of heart normal Right ventricle is dilated and pumping function is down Pulmonic valve with at least moderate insufficiency. I would recommend cardiac MRI to evaluate RV and pulmonic valve more precisely       Patient informed of echo results and plan for cardiac MRI.  BMET was drawn 07/25/16.  Order placed for cardiac MRI, pt denies needing any sedation. Will send staff message to [facility]) for scheduling and pre cert.

## 2016-07-31 NOTE — Telephone Encounter (Signed)
-----   Message from Fay Records, MD sent at 07/30/2016  9:50 PM EDT ----- I have reviewed  Echo  Pumping function of L side of heart normal  Right ventricle is dilated and pumping function is down Pulmonic valve with at least moderate insufficiency. I would recommend cardiac MRI to evaluate RV and pulmonic valve more precisely

## 2016-08-01 ENCOUNTER — Telehealth: Payer: Self-pay | Admitting: Internal Medicine

## 2016-08-01 ENCOUNTER — Encounter: Payer: Self-pay | Admitting: Internal Medicine

## 2016-08-01 NOTE — Assessment & Plan Note (Signed)

## 2016-08-01 NOTE — Telephone Encounter (Signed)
Called patient and gave her the date, time and location of the cardiac MRI.  Letter mailed today.

## 2016-08-09 ENCOUNTER — Ambulatory Visit (HOSPITAL_COMMUNITY)
Admission: RE | Admit: 2016-08-09 | Discharge: 2016-08-09 | Disposition: A | Discharge status: Home / Self Care | Payer: PPO | Source: Ambulatory Visit | Attending: Internal Medicine | Admitting: Internal Medicine

## 2016-08-09 DIAGNOSIS — Z9889 Other specified postprocedural states: Secondary | ICD-10-CM | POA: Insufficient documentation

## 2016-08-09 DIAGNOSIS — R931 Abnormal findings on diagnostic imaging of heart and coronary circulation: Secondary | ICD-10-CM | POA: Diagnosis not present

## 2016-08-09 DIAGNOSIS — I371 Nonrheumatic pulmonary valve insufficiency: Secondary | ICD-10-CM | POA: Diagnosis not present

## 2016-08-09 DIAGNOSIS — Q249 Congenital malformation of heart, unspecified: Secondary | ICD-10-CM | POA: Diagnosis not present

## 2016-08-09 MED ORDER — GADOBENATE DIMEGLUMINE 529 MG/ML IV SOLN
16.0000 mL | Freq: Once | INTRAVENOUS | Status: AC | PRN
  Administered 2016-08-09: 16 mL via INTRAVENOUS

## 2016-08-16 ENCOUNTER — Ambulatory Visit (INDEPENDENT_AMBULATORY_CARE_PROVIDER_SITE_OTHER): Payer: PPO | Admitting: Internal Medicine

## 2016-08-16 ENCOUNTER — Ambulatory Visit
Admission: RE | Admit: 2016-08-16 | Discharge: 2016-08-16 | Disposition: A | Discharge status: Home / Self Care | Payer: PPO | Source: Ambulatory Visit | Attending: Internal Medicine | Admitting: Internal Medicine

## 2016-08-16 VITALS — BP 138/88 | HR 63 | Temp 97.8°F | Resp 16 | Ht 62.0 in | Wt 131.2 lb

## 2016-08-16 DIAGNOSIS — Z23 Encounter for immunization: Secondary | ICD-10-CM | POA: Diagnosis not present

## 2016-08-16 DIAGNOSIS — I8002 Phlebitis and thrombophlebitis of superficial vessels of left lower extremity: Secondary | ICD-10-CM | POA: Diagnosis not present

## 2016-08-16 DIAGNOSIS — Z1231 Encounter for screening mammogram for malignant neoplasm of breast: Secondary | ICD-10-CM

## 2016-08-16 MED ORDER — ETODOLAC ER 400 MG PO TB24: 400.0000 mg | ORAL_TABLET | Freq: Every day | ORAL | 2 refills | Status: DC

## 2016-08-16 NOTE — Patient Instructions (Signed)
Phlebitis  Phlebitis is soreness and swelling (inflammation) of a vein. This can occur in your arms, legs, or torso (trunk), as well as deeper inside your body. Phlebitis is usually not serious when it occurs close to the surface of the body. However, it can cause serious problems when it occurs in a vein deeper inside the body.  CAUSES   Phlebitis can be triggered by various things, including:    Reduced blood flow through your veins. This can happen with:    Bed rest over a long period.    Long-distance travel.    Injury.    Surgery.    Being overweight (obese) or pregnant.   Having an IV tube put in the vein and getting certain medicines through the vein.   Cancer and cancer treatment.   Use of illegal drugs taken through the vein.   Inflammatory diseases.   Inherited (genetic) diseases that increase the risk of blood clots.   Hormone therapy, such as birth control pills.  SIGNS AND SYMPTOMS    Red, tender, swollen, and painful area on your skin. Usually, the area will be long and narrow.   Firmness along the center of the affected area. This can indicate that a blood clot has formed.   Low-grade fever.  DIAGNOSIS   A health care provider can usually diagnose phlebitis by examining the affected area and asking about your symptoms. To check for infection or blood clots, your health care provider may order blood tests or an ultrasound exam of the area. Blood tests and your family history may also indicate if you have an underlying genetic disease that causes blood clots. Occasionally, a piece of tissue is taken from the body (biopsy sample) if an unusual cause of phlebitis is suspected.  TREATMENT   Treatment will vary depending on the severity of the condition and the area of the body affected. Treatment may include:   Use of a warm compress or heating pad.   Use of compression stockings or bandages.   Anti-inflammatory medicines.   Removal of any IV tube that may be causing the problem.   Medicines  that kill germs (antibiotics) if an infection is present.   Blood-thinning medicines if a blood clot is suspected or present.   In rare cases, surgery may be needed to remove damaged sections of vein.  HOME CARE INSTRUCTIONS    Only take over-the-counter or prescription medicines as directed by your health care provider. Take all medicines exactly as prescribed.   Raise (elevate) the affected area above the level of your heart as directed by your health care provider.   Apply a warm compress or heating pad to the affected area as directed by your health care provider. Do not sleep with the heating pad.   Use compression stockings or bandages as directed. These will speed healing and prevent the condition from coming back.   If you are on blood thinners:    Get follow-up blood tests as directed by your health care provider.    Check with your health care provider before using any new medicines.    Carry a medical alert card or wear your medical alert jewelry to show that you are on blood thinners.   For phlebitis in the legs:    Avoid prolonged standing or bed rest.    Keep your legs moving. Raise your legs when sitting or lying.   Do not smoke.   Women, particularly those over the age of 35, should consider   the risks and benefits of taking the contraceptive pill. This kind of hormone treatment can increase your risk for blood clots.   Follow up with your health care provider as directed.  SEEK MEDICAL CARE IF:    You have unusual bruising or any bleeding problems.   Your swelling or pain in the affected area is not improving.   You are on anti-inflammatory medicine, and you develop belly (abdominal) pain.  SEEK IMMEDIATE MEDICAL CARE IF:    You have a sudden onset of chest pain or difficulty breathing.   You have a fever or persistent symptoms for more than 2-3 days.   You have a fever and your symptoms suddenly get worse.  MAKE SURE YOU:   Understand these instructions.   Will watch your  condition.   Will get help right away if you are not doing well or get worse.     This information is not intended to replace advice given to you by your health care provider. Make sure you discuss any questions you have with your health care provider.     Document Released: 11/27/2001 Document Revised: 09/23/2013 Document Reviewed: 08/10/2013  Elsevier Interactive Patient Education 2016 Elsevier Inc.
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
## 2016-08-16 NOTE — Progress Notes (Signed)
Pre visit review using our clinic review tool, if applicable. No additional management support is needed unless otherwise documented below in the visit note. 

## 2016-08-17 LAB — HM MAMMOGRAPHY

## 2016-08-21 ENCOUNTER — Encounter: Payer: Self-pay | Admitting: Internal Medicine

## 2016-08-27 ENCOUNTER — Encounter: Payer: Self-pay | Admitting: Gynecology

## 2016-08-27 ENCOUNTER — Ambulatory Visit (INDEPENDENT_AMBULATORY_CARE_PROVIDER_SITE_OTHER): Payer: PPO | Admitting: Gynecology

## 2016-08-27 VITALS — BP 124/80 | Ht 62.0 in | Wt 133.0 lb

## 2016-08-27 DIAGNOSIS — N898 Other specified noninflammatory disorders of vagina: Secondary | ICD-10-CM | POA: Diagnosis not present

## 2016-08-27 DIAGNOSIS — N952 Postmenopausal atrophic vaginitis: Secondary | ICD-10-CM | POA: Diagnosis not present

## 2016-08-27 DIAGNOSIS — Z01419 Encounter for gynecological examination (general) (routine) without abnormal findings: Secondary | ICD-10-CM | POA: Diagnosis not present

## 2016-08-27 DIAGNOSIS — N941 Unspecified dyspareunia: Secondary | ICD-10-CM

## 2016-08-27 DIAGNOSIS — R21 Rash and other nonspecific skin eruption: Secondary | ICD-10-CM | POA: Diagnosis not present

## 2016-08-27 DIAGNOSIS — R011 Cardiac murmur, unspecified: Secondary | ICD-10-CM

## 2016-08-27 LAB — WET PREP FOR TRICH, YEAST, CLUE
CLUE CELLS WET PREP: NONE SEEN
Trich, Wet Prep: NONE SEEN
YEAST WET PREP: NONE SEEN

## 2016-08-27 NOTE — Progress Notes (Signed)
    Michelle [name] 18-Dec-1950 [id number]        65 y.o.  G0P0000 new patient for breast and pelvic exam. Complaining of vaginal discomfort with intercourse and sometimes bleeding with slight abrasions. No spontaneous vaginal bleeding or other issues.  Past medical history,surgical history, problem list, medications, allergies, family history and social history were all reviewed and documented as reviewed in the EPIC chart.  ROS:  Performed with pertinent positives and negatives included in the history, assessment and plan.   Additional significant findings :  None   Exam: [name] assistant Vitals:   08/27/16 1408  BP: 124/80  Weight: 133 lb (60.3 kg)  Height: 5\' 2"  (1.575 m)   Body mass index is 24.33 kg/m.  General appearance:  Normal affect, orientation and appearance. Skin: Erythematous skin rash over all extremities and trunk HEENT: Without gross lesions.  No cervical or supraclavicular adenopathy. Thyroid normal.  Lungs:  Clear without wheezing, rales or rhonchi Cardiac: RR, without Systolic murmur Abdominal:  Soft, nontender, without masses, guarding, rebound, organomegaly or hernia Breasts:  Examined lying and sitting without masses, retractions, discharge or axillary adenopathy. Pelvic:  Ext/BUS/Vagina with atrophic changes  Cervix with atrophic changes  Uterus anteverted, normal size, shape and contour, midline and mobile nontender   Adnexa without masses or tenderness    Anus and perineum normal   Rectovaginal normal sphincter tone without palpated masses or tenderness.    Assessment/Plan:  65 y.o. G0P0000 female for breast and pelvic exam.   1. Dyspareunia/atrophic genital changes. Patient having consistent dyspareunia with intercourse. Has tried OTC are based lubricants without success. Reviewed options to include oil-based lubricants up to and including vaginal estrogen supplementation. Vaginal creams, Vagifem and Osphena reviewed. The pros and cons of each  choice discussed. Possibility of absorption with systemic effects to include thrombosis breast cancer and endometrial stimulation all reviewed. At this point the patient wants to try oil-based products and will follow up if this continues to be a problem. She did have a little bit of bleeding with intercourse which I think is abrasion related. If she has any vaginal bleeding spontaneously she knows to follow up for further evaluation. 2. Postmenopausal. No significant hot flushes, night sweats or any vaginal bleeding. Continue to monitor report any issues or bleeding. 3. Pap smear/HPV 2014. No Pap smear done today. No history of significant abnormal Pap smears previously. Options to stop screening altogether per current screening guidelines based on age or less frequent screening intervals reviewed. Will readdress on annual basis. 4. Mammography 08/2016. Continue with annual mammography when due. SBE monthly reviewed. 5. Colonoscopy 2015. Repeat at their recommended interval. 6. Osteoporosis. Actively being followed by her primary physician for osteoporosis currently on alendronate. I do not have copies of her DEXA and she'll continue to follow up with her primary physician in reference to this. 7. Health maintenance.  Heart murmur consistent with her congenital heart disease history. Followed by cardiology. 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 No routine lab work done today as this is done elsewhere. Follow up in one year, sooner as needed.  Additional time in excess of her breast and pelvic exam was spent in direct face to face counseling and coordination of care in regards to her problems of dyspareunia and atrophic genital changes.    Anastasio Auerbach MD, 3:03 PM 08/27/2016

## 2016-08-27 NOTE — Patient Instructions (Signed)

## 2016-09-27 DIAGNOSIS — Q213 Tetralogy of Fallot: Secondary | ICD-10-CM | POA: Insufficient documentation

## 2016-09-27 DIAGNOSIS — Z87820 Personal history of traumatic brain injury: Secondary | ICD-10-CM | POA: Insufficient documentation

## 2016-10-11 ENCOUNTER — Other Ambulatory Visit: Payer: Self-pay | Admitting: Internal Medicine

## 2016-10-11 ENCOUNTER — Other Ambulatory Visit: Payer: Self-pay | Admitting: *Deleted

## 2016-10-11 DIAGNOSIS — G5791 Unspecified mononeuropathy of right lower limb: Secondary | ICD-10-CM

## 2016-10-11 MED ORDER — ALENDRONATE SODIUM 70 MG PO TABS: 70.0000 mg | ORAL_TABLET | ORAL | 5 refills | Status: DC

## 2016-10-11 MED ORDER — AMITRIPTYLINE HCL 50 MG PO TABS
50.0000 mg | ORAL_TABLET | Freq: Every day | ORAL | 3 refills | Status: DC
Start: 2016-10-11 — End: 2016-11-07

## 2016-10-11 NOTE — Telephone Encounter (Signed)
Rec'd call pt states she is needing refills on her fosamax & Amitriptyline. The amitriptyline was rx by her previous MD which she no longer see. I sent fosamax pls advise on amitriptyline...Michelle [name]

## 2016-10-11 NOTE — Telephone Encounter (Signed)
DONE

## 2016-11-07 ENCOUNTER — Ambulatory Visit (INDEPENDENT_AMBULATORY_CARE_PROVIDER_SITE_OTHER): Payer: PPO | Admitting: Internal Medicine

## 2016-11-07 ENCOUNTER — Encounter: Payer: Self-pay | Admitting: Internal Medicine

## 2016-11-07 ENCOUNTER — Other Ambulatory Visit (INDEPENDENT_AMBULATORY_CARE_PROVIDER_SITE_OTHER): Payer: PPO

## 2016-11-07 VITALS — BP 142/90 | HR 64 | Temp 97.8°F | Resp 16 | Ht 62.0 in | Wt 134.5 lb

## 2016-11-07 DIAGNOSIS — R946 Abnormal results of thyroid function studies: Secondary | ICD-10-CM | POA: Diagnosis not present

## 2016-11-07 DIAGNOSIS — G5791 Unspecified mononeuropathy of right lower limb: Secondary | ICD-10-CM

## 2016-11-07 DIAGNOSIS — Z23 Encounter for immunization: Secondary | ICD-10-CM

## 2016-11-07 DIAGNOSIS — I10 Essential (primary) hypertension: Secondary | ICD-10-CM | POA: Diagnosis not present

## 2016-11-07 DIAGNOSIS — R7989 Other specified abnormal findings of blood chemistry: Secondary | ICD-10-CM

## 2016-11-07 DIAGNOSIS — E039 Hypothyroidism, unspecified: Secondary | ICD-10-CM | POA: Diagnosis not present

## 2016-11-07 LAB — CBC WITH DIFFERENTIAL/PLATELET
BASOS PCT: 0.5 % (ref 0.0–3.0)
Basophils Absolute: 0 10*3/uL (ref 0.0–0.1)
Eosinophils Absolute: 0.2 10*3/uL (ref 0.0–0.7)
Eosinophils Relative: 1.9 % (ref 0.0–5.0)
HEMATOCRIT: 38.9 % (ref 36.0–46.0)
Hemoglobin: 13.1 g/dL (ref 12.0–15.0)
LYMPHS ABS: 1.7 10*3/uL (ref 0.7–4.0)
LYMPHS PCT: 18.1 % (ref 12.0–46.0)
MCHC: 33.8 g/dL (ref 30.0–36.0)
MCV: 89.5 fl (ref 78.0–100.0)
MONOS PCT: 6.7 % (ref 3.0–12.0)
Monocytes Absolute: 0.6 10*3/uL (ref 0.1–1.0)
NEUTROS ABS: 7 10*3/uL (ref 1.4–7.7)
Neutrophils Relative %: 72.8 % (ref 43.0–77.0)
PLATELETS: 240 10*3/uL (ref 150.0–400.0)
RBC: 4.35 Mil/uL (ref 3.87–5.11)
RDW: 13.8 % (ref 11.5–15.5)
WBC: 9.7 10*3/uL (ref 4.0–10.5)

## 2016-11-07 LAB — COMPREHENSIVE METABOLIC PANEL
ALT: 20 U/L (ref 0–35)
AST: 25 U/L (ref 0–37)
Albumin: 4.6 g/dL (ref 3.5–5.2)
Alkaline Phosphatase: 46 U/L (ref 39–117)
BILIRUBIN TOTAL: 0.6 mg/dL (ref 0.2–1.2)
BUN: 15 mg/dL (ref 6–23)
CALCIUM: 9.3 mg/dL (ref 8.4–10.5)
CHLORIDE: 104 meq/L (ref 96–112)
CO2: 30 meq/L (ref 19–32)
Creatinine, Ser: 1.03 mg/dL (ref 0.40–1.20)
GFR: 56.86 mL/min — AB (ref >60.00)
Glucose, Bld: 91 mg/dL (ref 70–99)
POTASSIUM: 4.6 meq/L (ref 3.5–5.1)
Sodium: 142 mEq/L (ref 135–145)
Total Protein: 7.3 g/dL (ref 6.0–8.3)

## 2016-11-07 LAB — THYROID PANEL WITH TSH
FREE THYROXINE INDEX: 2.2 (ref 1.4–3.8)
T3 Uptake: 28 % (ref 22–35)
T4 TOTAL: 7.8 ug/dL (ref 4.5–12.0)
TSH: 7.49 mIU/L — ABNORMAL HIGH

## 2016-11-07 MED ORDER — AMITRIPTYLINE HCL 50 MG PO TABS: 50.0000 mg | ORAL_TABLET | Freq: Every day | ORAL | 3 refills | Status: DC

## 2016-11-07 NOTE — Progress Notes (Signed)
Pre visit review using our clinic review tool, if applicable. No additional management support is needed unless otherwise documented below in the visit note. 

## 2016-11-07 NOTE — Patient Instructions (Signed)
Hypothyroidism Hypothyroidism is a disorder of the thyroid. The thyroid is a large gland that is located in the lower front of the neck. The thyroid releases hormones that control how the body works. With hypothyroidism, the thyroid does not make enough of these hormones. What are the causes? Causes of hypothyroidism may include:  Viral infections.  Pregnancy.  Your own defense system (immune system) attacking your thyroid.  Certain medicines.  Birth defects.  Past radiation treatments to your head or neck.  Past treatment with radioactive iodine.  Past surgical removal of part or all of your thyroid.  Problems with the gland that is located in the center of your brain (pituitary).  What are the signs or symptoms? Signs and symptoms of hypothyroidism may include:  Feeling as though you have no energy (lethargy).  Inability to tolerate cold.  Weight gain that is not explained by a change in diet or exercise habits.  Dry skin.  Coarse hair.  Menstrual irregularity.  Slowing of thought processes.  Constipation.  Sadness or depression.  How is this diagnosed? Your health care provider may diagnose hypothyroidism with blood tests and ultrasound tests. How is this treated? Hypothyroidism is treated with medicine that replaces the hormones that your body does not make. After you begin treatment, it may take several weeks for symptoms to go away. Follow these instructions at home:  Take medicines only as directed by your health care provider.  If you start taking any new medicines, tell your health care provider.  Keep all follow-up visits as directed by your health care provider. This is important. As your condition improves, your dosage needs may change. You will need to have blood tests regularly so that your health care provider can watch your condition. Contact a health care provider if:  Your symptoms do not get better with treatment.  You are taking thyroid  replacement medicine and: ? You sweat excessively. ? You have tremors. ? You feel anxious. ? You lose weight rapidly. ? You cannot tolerate heat. ? You have emotional swings. ? You have diarrhea. ? You feel weak. Get help right away if:  You develop chest pain.  You develop an irregular heartbeat.  You develop a rapid heartbeat. This information is not intended to replace advice given to you by your health care provider. Make sure you discuss any questions you have with your health care provider. Document Released: 12/03/2005 Document Revised: 05/10/2016 Document Reviewed: 04/20/2014 Elsevier Interactive Patient Education  2017 [employer]  

## 2016-11-07 NOTE — Progress Notes (Signed)
Subjective:  Patient ID: Michelle [name], female    DOB: Jan 04, 1951  Age: 65 y.o. MRN: [id number]  CC: Hypertension   HPI AMARIS DECHRISTOPHER presents for a blood pressure check. She complains of worsening fatigue over the last few months. She tells me she was recently seen at [facility] and she needs to have heart valve surgery for a leaky valve. She denies any recent episodes of chest pain, shortness of breath, edema, palpitations, or syncope.  Outpatient Medications Prior to Visit  Medication Sig Dispense Refill  . alendronate (FOSAMAX) 70 MG tablet Take 1 tablet (70 mg total) by mouth every 7 (seven) days. 4 tablet 5  . aspirin 81 MG tablet Take 81 mg by mouth daily.      Marland Kitchen amitriptyline (ELAVIL) 50 MG tablet Take 1 tablet (50 mg total) by mouth at bedtime. 90 tablet 3  . cholecalciferol (VITAMIN D) 1000 UNITS tablet Take 2,000 Units by mouth daily.    Marland Kitchen losartan (COZAAR) 50 MG tablet Take 1 tablet (50 mg total) by mouth daily. (Patient not taking: Reported on 11/07/2016) 30 tablet 11  . Omega-3 Fatty Acids (FISH OIL) 1000 MG CAPS Take 1 capsule by mouth 2 (two) times daily.    Marland Kitchen triamcinolone cream (KENALOG) 0.1 %     . vitamin E 1000 UNIT capsule Take 1,000 Units by mouth daily.    . beta carotene 10000 UNIT capsule Take 10,000 Units by mouth daily.    Marland Kitchen etodolac (LODINE XL) 400 MG 24 hr tablet Take 1 tablet (400 mg total) by mouth daily. (Patient not taking: Reported on 11/07/2016) 30 tablet 2  . Ginkgo Biloba 100 MG CAPS Take 200 mg by mouth daily.    . Ginseng 100 MG CAPS Take 1 capsule by mouth daily.     . Multiple Vitamin (MULTIVITAMIN) tablet Take 1 tablet by mouth daily.     No facility-administered medications prior to visit.     ROS Review of Systems  Constitutional: Positive for fatigue. Negative for appetite change, diaphoresis and unexpected weight change.  HENT: Negative.   Eyes: Negative.  Negative for visual disturbance.  Respiratory: Negative.  Negative for cough,  chest tightness, shortness of breath and stridor.   Cardiovascular: Negative.  Negative for chest pain, palpitations and leg swelling.  Gastrointestinal: Negative.  Negative for abdominal pain, constipation, diarrhea, nausea and vomiting.  Genitourinary: Negative.   Musculoskeletal: Negative.   Neurological: Negative.  Negative for dizziness, syncope, weakness, light-headedness and headaches.  Hematological: Negative.  Negative for adenopathy. Does not bruise/bleed easily.  Psychiatric/Behavioral: Positive for sleep disturbance (mild insomnia). Negative for confusion, decreased concentration and dysphoric mood. The patient is not nervous/anxious.     Objective:  BP (!) 142/90 (BP Location: Left Arm, Patient Position: Sitting, Cuff Size: Normal)   Pulse 64   Temp 97.8 F (36.6 C) (Oral)   Resp 16   Ht 5\' 2"  (1.575 m)   Wt 134 lb 8 oz (61 kg)   SpO2 98%   BMI 24.60 kg/m   BP Readings from Last 3 Encounters:  11/07/16 (!) 142/90  08/27/16 124/80  08/16/16 138/88    Wt Readings from Last 3 Encounters:  11/07/16 134 lb 8 oz (61 kg)  08/27/16 133 lb (60.3 kg)  08/16/16 131 lb 4 oz (59.5 kg)    Physical Exam  Constitutional: She is oriented to person, place, and time. No distress.  HENT:  Mouth/Throat: Oropharynx is clear and moist. No oropharyngeal exudate.  Eyes: Conjunctivae  are normal. Right eye exhibits no discharge. Left eye exhibits no discharge. No scleral icterus.  Neck: Normal range of motion. Neck supple. No JVD present. No tracheal deviation present. No thyromegaly present.  Cardiovascular: Normal rate, regular rhythm, S1 normal, S2 normal and intact distal pulses.  Exam reveals no gallop, no S3, no S4 and no friction rub.   Murmur heard.  Systolic murmur is present with a grade of 1/6   Diastolic murmur is present with a grade of 2/6  Pulmonary/Chest: Effort normal and breath sounds normal. No stridor. No respiratory distress. She has no wheezes. She has no rales.  She exhibits no tenderness.  Abdominal: Soft. Bowel sounds are normal. She exhibits no distension and no mass. There is no tenderness. There is no rebound and no guarding.  Musculoskeletal: Normal range of motion. She exhibits no edema, tenderness or deformity.  Lymphadenopathy:    She has no cervical adenopathy.  Neurological: She is oriented to person, place, and time.  Skin: Skin is warm and dry. No rash noted. She is not diaphoretic. No erythema. No pallor.  Psychiatric: She has a normal mood and affect. Her behavior is normal. Judgment and thought content normal.    Lab Results  Component Value Date   WBC 9.7 11/07/2016   HGB 13.1 11/07/2016   HCT 38.9 11/07/2016   PLT 240.0 11/07/2016   GLUCOSE 91 11/07/2016   CHOL 190 07/30/2016   TRIG 119.0 07/30/2016   HDL 48.70 07/30/2016   LDLDIRECT 113 (H) 06/16/2008   LDLCALC 118 (H) 07/30/2016   ALT 20 11/07/2016   AST 25 11/07/2016   NA 142 11/07/2016   K 4.6 11/07/2016   CL 104 11/07/2016   CREATININE 1.03 11/07/2016   BUN 15 11/07/2016   CO2 30 11/07/2016   TSH 7.49 (H) 11/07/2016   INR 1.05 06/26/2016    Mm Screening Breast Tomo Bilateral  Result Date: 08/17/2016 CLINICAL DATA:  Screening. EXAM: 2D DIGITAL SCREENING BILATERAL MAMMOGRAM WITH CAD AND ADJUNCT TOMO COMPARISON:  Previous exam(s). ACR Breast Density Category d: The breast tissue is extremely dense, which lowers the sensitivity of mammography. FINDINGS: There are no findings suspicious for malignancy. Images were processed with CAD. IMPRESSION: No mammographic evidence of malignancy. A result letter of this screening mammogram will be mailed directly to the patient. RECOMMENDATION: Screening mammogram in one year. (Code:SM-B-01Y) BI-RADS CATEGORY  1: Negative. Electronically Signed   By: Everlean Alstrom M.D.   On: 08/17/2016 08:02    Assessment & Plan:   Jhene was seen today for hypertension.  Diagnoses and all orders for this visit:  Need for prophylactic  vaccination against Streptococcus pneumoniae (pneumococcus)  TSH elevation- She complains of fatigue and has a rising TSH level, screening for autoimmune thyroid disease is negative, will treat for hypothyroidism. -     Thyroid Panel With TSH; Future -     Thyroid peroxidase antibody; Future -     levothyroxine (SYNTHROID, LEVOTHROID) 50 MCG tablet; Take 1 tablet (50 mcg total) by mouth daily.  Mononeuropathy of right lower extremity- will continue Elavil as needed. -     amitriptyline (ELAVIL) 50 MG tablet; Take 1 tablet (50 mg total) by mouth at bedtime.  Essential hypertension- her blood pressures adequately well-controlled. -     CBC with Differential/Platelet; Future -     Comprehensive metabolic panel; Future  Acquired hypothyroidism- will start treating for hypothyroidism with levothyroxine. -     levothyroxine (SYNTHROID, LEVOTHROID) 50 MCG tablet; Take 1 tablet (  50 mcg total) by mouth daily.  Other orders -     Pneumococcal polysaccharide vaccine 23-valent greater than or equal to 2yo subcutaneous/IM   I have discontinued Ms. Karapetian's multivitamin, beta carotene, Ginkgo Biloba, Ginseng, and etodolac. I am also having her start on levothyroxine. Additionally, I am having her maintain her aspirin, cholecalciferol, vitamin E, Fish Oil, losartan, triamcinolone cream, alendronate, and amitriptyline.  Meds ordered this encounter  Medications  . amitriptyline (ELAVIL) 50 MG tablet    Sig: Take 1 tablet (50 mg total) by mouth at bedtime.    Dispense:  90 tablet    Refill:  3  . levothyroxine (SYNTHROID, LEVOTHROID) 50 MCG tablet    Sig: Take 1 tablet (50 mcg total) by mouth daily.    Dispense:  30 tablet    Refill:  2     Follow-up: Return in about 3 months (around 02/07/2017).  [name], MD

## 2016-11-08 DIAGNOSIS — E039 Hypothyroidism, unspecified: Secondary | ICD-10-CM | POA: Insufficient documentation

## 2016-11-08 LAB — THYROID PEROXIDASE ANTIBODY: Thyroperoxidase Ab SerPl-aCnc: 1 IU/mL (ref <9)

## 2016-11-08 MED ORDER — LEVOTHYROXINE SODIUM 50 MCG PO TABS: 50.0000 ug | ORAL_TABLET | Freq: Every day | ORAL | 2 refills | Status: DC

## 2016-12-27 DIAGNOSIS — I517 Cardiomegaly: Secondary | ICD-10-CM | POA: Diagnosis not present

## 2016-12-27 DIAGNOSIS — R0609 Other forms of dyspnea: Secondary | ICD-10-CM | POA: Diagnosis not present

## 2016-12-27 DIAGNOSIS — Q213 Tetralogy of Fallot: Secondary | ICD-10-CM | POA: Diagnosis not present

## 2016-12-27 DIAGNOSIS — I071 Rheumatic tricuspid insufficiency: Secondary | ICD-10-CM | POA: Diagnosis not present

## 2017-01-01 DIAGNOSIS — R0609 Other forms of dyspnea: Secondary | ICD-10-CM | POA: Insufficient documentation

## 2017-01-01 DIAGNOSIS — I071 Rheumatic tricuspid insufficiency: Secondary | ICD-10-CM | POA: Insufficient documentation

## 2017-01-01 DIAGNOSIS — R0600 Dyspnea, unspecified: Secondary | ICD-10-CM | POA: Insufficient documentation

## 2017-01-18 DIAGNOSIS — I371 Nonrheumatic pulmonary valve insufficiency: Secondary | ICD-10-CM | POA: Diagnosis not present

## 2017-01-18 DIAGNOSIS — I071 Rheumatic tricuspid insufficiency: Secondary | ICD-10-CM | POA: Diagnosis not present

## 2017-01-22 ENCOUNTER — Ambulatory Visit (INDEPENDENT_AMBULATORY_CARE_PROVIDER_SITE_OTHER): Payer: Medicare HMO | Admitting: Internal Medicine

## 2017-01-22 ENCOUNTER — Other Ambulatory Visit: Payer: Medicare HMO

## 2017-01-22 ENCOUNTER — Telehealth: Payer: Self-pay | Admitting: Internal Medicine

## 2017-01-22 ENCOUNTER — Encounter: Payer: Self-pay | Admitting: Internal Medicine

## 2017-01-22 VITALS — BP 162/100 | HR 68 | Temp 97.6°F | Resp 16 | Ht 62.0 in | Wt 132.2 lb

## 2017-01-22 DIAGNOSIS — E039 Hypothyroidism, unspecified: Secondary | ICD-10-CM

## 2017-01-22 DIAGNOSIS — Q249 Congenital malformation of heart, unspecified: Secondary | ICD-10-CM | POA: Diagnosis not present

## 2017-01-22 DIAGNOSIS — I10 Essential (primary) hypertension: Secondary | ICD-10-CM

## 2017-01-22 LAB — THYROID PANEL WITH TSH
Free Thyroxine Index: 2.8 (ref 1.4–3.8)
T3 Uptake: 27 % (ref 22–35)
T4, Total: 10.4 ug/dL (ref 4.5–12.0)
TSH: 1.53 m[IU]/L

## 2017-01-22 NOTE — Telephone Encounter (Signed)
Confirmed with PCP that no new medications were prescribed at todays visit.   Informed Mr. Michelle [name] that there were no new medications.

## 2017-01-22 NOTE — Patient Instructions (Signed)
Hypothyroidism Hypothyroidism is a disorder of the thyroid. The thyroid is a large gland that is located in the lower front of the neck. The thyroid releases hormones that control how the body works. With hypothyroidism, the thyroid does not make enough of these hormones. What are the causes? Causes of hypothyroidism may include:  Viral infections.  Pregnancy.  Your own defense system (immune system) attacking your thyroid.  Certain medicines.  Birth defects.  Past radiation treatments to your head or neck.  Past treatment with radioactive iodine.  Past surgical removal of part or all of your thyroid.  Problems with the gland that is located in the center of your brain (pituitary).  What are the signs or symptoms? Signs and symptoms of hypothyroidism may include:  Feeling as though you have no energy (lethargy).  Inability to tolerate cold.  Weight gain that is not explained by a change in diet or exercise habits.  Dry skin.  Coarse hair.  Menstrual irregularity.  Slowing of thought processes.  Constipation.  Sadness or depression.  How is this diagnosed? Your health care provider may diagnose hypothyroidism with blood tests and ultrasound tests. How is this treated? Hypothyroidism is treated with medicine that replaces the hormones that your body does not make. After you begin treatment, it may take several weeks for symptoms to go away. Follow these instructions at home:  Take medicines only as directed by your health care provider.  If you start taking any new medicines, tell your health care provider.  Keep all follow-up visits as directed by your health care provider. This is important. As your condition improves, your dosage needs may change. You will need to have blood tests regularly so that your health care provider can watch your condition. Contact a health care provider if:  Your symptoms do not get better with treatment.  You are taking thyroid  replacement medicine and: ? You sweat excessively. ? You have tremors. ? You feel anxious. ? You lose weight rapidly. ? You cannot tolerate heat. ? You have emotional swings. ? You have diarrhea. ? You feel weak. Get help right away if:  You develop chest pain.  You develop an irregular heartbeat.  You develop a rapid heartbeat. This information is not intended to replace advice given to you by your health care provider. Make sure you discuss any questions you have with your health care provider. Document Released: 12/03/2005 Document Revised: 05/10/2016 Document Reviewed: 04/20/2014 Elsevier Interactive Patient Education  2017 [employer]  

## 2017-01-22 NOTE — Progress Notes (Signed)
Pre visit review using our clinic review tool, if applicable. No additional management support is needed unless otherwise documented below in the visit note. 

## 2017-01-22 NOTE — Progress Notes (Signed)
Subjective:  Patient ID: Michelle [name], female    DOB: 06/15/1951  Age: 66 y.o. MRN: [id number]  CC: Hypertension and Hypothyroidism   HPI Michelle [name] presents for Follow-up. She is having valve repair surgery later this week at [facility]. She is concerned that her blood pressure is not well controlled but she also admits that she doesn't think she's taking the losartan. She thinks she is compliant with her thyroid medication. She denies any recent episodes of headache/blurred vision/chest pain/shortness of breath/palpitations/edema/fatigue.  Outpatient Medications Prior to Visit  Medication Sig Dispense Refill  . alendronate (FOSAMAX) 70 MG tablet Take 1 tablet (70 mg total) by mouth every 7 (seven) days. 4 tablet 5  . amitriptyline (ELAVIL) 50 MG tablet Take 1 tablet (50 mg total) by mouth at bedtime. 90 tablet 3  . aspirin 81 MG tablet Take 81 mg by mouth daily.      . cholecalciferol (VITAMIN D) 1000 UNITS tablet Take 2,000 Units by mouth daily.    Marland Kitchen levothyroxine (SYNTHROID, LEVOTHROID) 50 MCG tablet Take 1 tablet (50 mcg total) by mouth daily. 30 tablet 2  . losartan (COZAAR) 50 MG tablet Take 1 tablet (50 mg total) by mouth daily. 30 tablet 11  . Omega-3 Fatty Acids (FISH OIL) 1000 MG CAPS Take 1 capsule by mouth 2 (two) times daily.    Marland Kitchen triamcinolone cream (KENALOG) 0.1 %     . vitamin E 1000 UNIT capsule Take 1,000 Units by mouth daily.     No facility-administered medications prior to visit.     ROS Review of Systems  Constitutional: Negative for diaphoresis and fever.  HENT: Negative.   Eyes: Negative for visual disturbance.  Respiratory: Negative for cough, chest tightness, shortness of breath and wheezing.   Cardiovascular: Negative for chest pain, palpitations and leg swelling.  Gastrointestinal: Negative for abdominal pain, constipation, diarrhea, nausea and vomiting.  Endocrine: Negative.  Negative for cold intolerance and heat intolerance.  Genitourinary:  Negative.   Musculoskeletal: Negative for back pain and neck pain.  Skin: Negative.   Neurological: Negative.  Negative for dizziness, weakness, light-headedness, numbness and headaches.  Hematological: Negative.  Negative for adenopathy. Does not bruise/bleed easily.  Psychiatric/Behavioral: Negative.     Objective:  BP (!) 162/100 (BP Location: Left Arm, Patient Position: Sitting, Cuff Size: Normal)   Pulse 68   Temp 97.6 F (36.4 C) (Oral)   Resp 16   Ht 5\' 2"  (1.575 m)   Wt 132 lb 4 oz (60 kg)   SpO2 96%   BMI 24.19 kg/m   BP Readings from Last 3 Encounters:  01/22/17 (!) 162/100  11/07/16 (!) 142/90  08/27/16 124/80    Wt Readings from Last 3 Encounters:  01/22/17 132 lb 4 oz (60 kg)  11/07/16 134 lb 8 oz (61 kg)  08/27/16 133 lb (60.3 kg)    Physical Exam  Constitutional: She is oriented to person, place, and time. No distress.  HENT:  Mouth/Throat: Oropharynx is clear and moist. No oropharyngeal exudate.  Eyes: Conjunctivae are normal. Right eye exhibits no discharge. Left eye exhibits no discharge. No scleral icterus.  Neck: Normal range of motion. Neck supple. No JVD present. No tracheal deviation present. No thyromegaly present.  Cardiovascular: Normal rate, regular rhythm and intact distal pulses.  Exam reveals no gallop and no friction rub.   Murmur heard.  Decrescendo systolic murmur is present with a grade of 1/6   Decrescendo diastolic murmur is present with a grade of  3/6  Pulmonary/Chest: Effort normal and breath sounds normal. No stridor. No respiratory distress. She has no wheezes. She has no rales. She exhibits no tenderness.  Abdominal: Soft. Bowel sounds are normal. She exhibits no distension and no mass. There is no tenderness. There is no rebound and no guarding.  Musculoskeletal: Normal range of motion. She exhibits no edema, tenderness or deformity.  Lymphadenopathy:    She has no cervical adenopathy.  Neurological: She is oriented to person,  place, and time.  Skin: Skin is warm and dry. No rash noted. She is not diaphoretic. No erythema. No pallor.  Vitals reviewed.   Lab Results  Component Value Date   WBC 9.7 11/07/2016   HGB 13.1 11/07/2016   HCT 38.9 11/07/2016   PLT 240.0 11/07/2016   GLUCOSE 91 11/07/2016   CHOL 190 07/30/2016   TRIG 119.0 07/30/2016   HDL 48.70 07/30/2016   LDLDIRECT 113 (H) 06/16/2008   LDLCALC 118 (H) 07/30/2016   ALT 20 11/07/2016   AST 25 11/07/2016   NA 142 11/07/2016   K 4.6 11/07/2016   CL 104 11/07/2016   CREATININE 1.03 11/07/2016   BUN 15 11/07/2016   CO2 30 11/07/2016   TSH 1.53 01/22/2017   INR 1.05 06/26/2016    Mm Screening Breast Tomo Bilateral  Result Date: 08/16/2016 CLINICAL DATA:  Screening. EXAM: 2D DIGITAL SCREENING BILATERAL MAMMOGRAM WITH CAD AND ADJUNCT TOMO COMPARISON:  Previous exam(s). ACR Breast Density Category d: The breast tissue is extremely dense, which lowers the sensitivity of mammography. FINDINGS: There are no findings suspicious for malignancy. Images were processed with CAD. IMPRESSION: No mammographic evidence of malignancy. A result letter of this screening mammogram will be mailed directly to the patient. RECOMMENDATION: Screening mammogram in one year. (Code:SM-B-01Y) BI-RADS CATEGORY  1: Negative. Electronically Signed   By: Michelle [name] M.D.   On: 08/17/2016 08:02    Assessment & Plan:   Michelle [name] was seen today for hypertension and hypothyroidism.  Diagnoses and all orders for this visit:  Acquired hypothyroidism- her TSH is in the normal range, she will remain on the current dose of levothyroxine. -     Thyroid Panel With TSH; Future  Essential hypertension- her blood pressure is not adequately well controlled, she agrees to restart the losartan but she is not willing to to take any additional medicines to control her blood pressure this close to upcoming heart repair surgery.  Congenital heart disease in adult- she has valve repair  surgery later this week at [facility].   I am having Michelle [name] maintain her aspirin, cholecalciferol, vitamin E, Fish Oil, losartan, triamcinolone cream, alendronate, amitriptyline, and levothyroxine.  No orders of the defined types were placed in this encounter.    Follow-up: Return in about 3 months (around 04/21/2017).  [name], MD

## 2017-01-22 NOTE — Telephone Encounter (Signed)
Patients boyfriend called in stating he thought Dr. Ronnald Ramp prescribed her something today but not sure what.  States pharmacy does not have.  Patient uses [facility]. Also states he believes patient needs refill on levothyroxine.

## 2017-01-23 DIAGNOSIS — Q213 Tetralogy of Fallot: Secondary | ICD-10-CM | POA: Diagnosis not present

## 2017-01-23 DIAGNOSIS — Z87820 Personal history of traumatic brain injury: Secondary | ICD-10-CM | POA: Diagnosis not present

## 2017-01-23 DIAGNOSIS — Z8774 Personal history of (corrected) congenital malformations of heart and circulatory system: Secondary | ICD-10-CM | POA: Diagnosis not present

## 2017-01-23 DIAGNOSIS — Z9889 Other specified postprocedural states: Secondary | ICD-10-CM | POA: Diagnosis not present

## 2017-01-23 DIAGNOSIS — I517 Cardiomegaly: Secondary | ICD-10-CM | POA: Diagnosis not present

## 2017-01-23 DIAGNOSIS — Z87891 Personal history of nicotine dependence: Secondary | ICD-10-CM | POA: Diagnosis not present

## 2017-01-23 DIAGNOSIS — I371 Nonrheumatic pulmonary valve insufficiency: Secondary | ICD-10-CM | POA: Diagnosis not present

## 2017-01-23 DIAGNOSIS — J939 Pneumothorax, unspecified: Secondary | ICD-10-CM | POA: Diagnosis not present

## 2017-01-23 DIAGNOSIS — I361 Nonrheumatic tricuspid (valve) insufficiency: Secondary | ICD-10-CM | POA: Diagnosis not present

## 2017-01-23 DIAGNOSIS — I10 Essential (primary) hypertension: Secondary | ICD-10-CM | POA: Diagnosis not present

## 2017-01-23 DIAGNOSIS — Q899 Congenital malformation, unspecified: Secondary | ICD-10-CM | POA: Diagnosis not present

## 2017-01-23 DIAGNOSIS — I071 Rheumatic tricuspid insufficiency: Secondary | ICD-10-CM | POA: Diagnosis not present

## 2017-01-23 DIAGNOSIS — Z7982 Long term (current) use of aspirin: Secondary | ICD-10-CM | POA: Diagnosis not present

## 2017-01-23 DIAGNOSIS — I281 Aneurysm of pulmonary artery: Secondary | ICD-10-CM | POA: Diagnosis not present

## 2017-01-23 DIAGNOSIS — Q212 Atrioventricular septal defect: Secondary | ICD-10-CM | POA: Diagnosis not present

## 2017-01-23 DIAGNOSIS — G8918 Other acute postprocedural pain: Secondary | ICD-10-CM | POA: Diagnosis not present

## 2017-01-23 DIAGNOSIS — Q243 Pulmonary infundibular stenosis: Secondary | ICD-10-CM | POA: Diagnosis not present

## 2017-01-23 DIAGNOSIS — Q211 Atrial septal defect: Secondary | ICD-10-CM | POA: Diagnosis not present

## 2017-01-23 DIAGNOSIS — J90 Pleural effusion, not elsewhere classified: Secondary | ICD-10-CM | POA: Diagnosis not present

## 2017-01-24 DIAGNOSIS — I371 Nonrheumatic pulmonary valve insufficiency: Secondary | ICD-10-CM | POA: Diagnosis not present

## 2017-01-24 DIAGNOSIS — Q243 Pulmonary infundibular stenosis: Secondary | ICD-10-CM | POA: Diagnosis not present

## 2017-01-24 DIAGNOSIS — Z8774 Personal history of (corrected) congenital malformations of heart and circulatory system: Secondary | ICD-10-CM | POA: Diagnosis not present

## 2017-01-24 DIAGNOSIS — Z9889 Other specified postprocedural states: Secondary | ICD-10-CM | POA: Diagnosis not present

## 2017-01-24 DIAGNOSIS — I071 Rheumatic tricuspid insufficiency: Secondary | ICD-10-CM | POA: Diagnosis not present

## 2017-01-25 DIAGNOSIS — Z8774 Personal history of (corrected) congenital malformations of heart and circulatory system: Secondary | ICD-10-CM | POA: Insufficient documentation

## 2017-02-03 DIAGNOSIS — Z7982 Long term (current) use of aspirin: Secondary | ICD-10-CM | POA: Diagnosis not present

## 2017-02-03 DIAGNOSIS — Z79891 Long term (current) use of opiate analgesic: Secondary | ICD-10-CM | POA: Diagnosis not present

## 2017-02-03 DIAGNOSIS — I10 Essential (primary) hypertension: Secondary | ICD-10-CM | POA: Diagnosis not present

## 2017-02-03 DIAGNOSIS — E785 Hyperlipidemia, unspecified: Secondary | ICD-10-CM | POA: Diagnosis not present

## 2017-02-03 DIAGNOSIS — Z952 Presence of prosthetic heart valve: Secondary | ICD-10-CM | POA: Diagnosis not present

## 2017-02-03 DIAGNOSIS — Q213 Tetralogy of Fallot: Secondary | ICD-10-CM | POA: Diagnosis not present

## 2017-02-03 DIAGNOSIS — Z48812 Encounter for surgical aftercare following surgery on the circulatory system: Secondary | ICD-10-CM | POA: Diagnosis not present

## 2017-02-04 ENCOUNTER — Ambulatory Visit: Payer: PPO | Admitting: Internal Medicine

## 2017-02-08 DIAGNOSIS — E785 Hyperlipidemia, unspecified: Secondary | ICD-10-CM | POA: Diagnosis not present

## 2017-02-08 DIAGNOSIS — Z952 Presence of prosthetic heart valve: Secondary | ICD-10-CM | POA: Diagnosis not present

## 2017-02-08 DIAGNOSIS — Z79891 Long term (current) use of opiate analgesic: Secondary | ICD-10-CM | POA: Diagnosis not present

## 2017-02-08 DIAGNOSIS — Z7982 Long term (current) use of aspirin: Secondary | ICD-10-CM | POA: Diagnosis not present

## 2017-02-08 DIAGNOSIS — Q213 Tetralogy of Fallot: Secondary | ICD-10-CM | POA: Diagnosis not present

## 2017-02-08 DIAGNOSIS — I10 Essential (primary) hypertension: Secondary | ICD-10-CM | POA: Diagnosis not present

## 2017-02-08 DIAGNOSIS — Z48812 Encounter for surgical aftercare following surgery on the circulatory system: Secondary | ICD-10-CM | POA: Diagnosis not present

## 2017-02-11 DIAGNOSIS — Z952 Presence of prosthetic heart valve: Secondary | ICD-10-CM | POA: Diagnosis not present

## 2017-02-11 DIAGNOSIS — Z9889 Other specified postprocedural states: Secondary | ICD-10-CM | POA: Diagnosis not present

## 2017-02-11 DIAGNOSIS — Z4889 Encounter for other specified surgical aftercare: Secondary | ICD-10-CM | POA: Diagnosis not present

## 2017-02-11 DIAGNOSIS — Z8774 Personal history of (corrected) congenital malformations of heart and circulatory system: Secondary | ICD-10-CM | POA: Diagnosis not present

## 2017-02-13 DIAGNOSIS — Q213 Tetralogy of Fallot: Secondary | ICD-10-CM | POA: Diagnosis not present

## 2017-02-13 DIAGNOSIS — Z79891 Long term (current) use of opiate analgesic: Secondary | ICD-10-CM | POA: Diagnosis not present

## 2017-02-13 DIAGNOSIS — E785 Hyperlipidemia, unspecified: Secondary | ICD-10-CM | POA: Diagnosis not present

## 2017-02-13 DIAGNOSIS — Z952 Presence of prosthetic heart valve: Secondary | ICD-10-CM | POA: Diagnosis not present

## 2017-02-13 DIAGNOSIS — Z7982 Long term (current) use of aspirin: Secondary | ICD-10-CM | POA: Diagnosis not present

## 2017-02-13 DIAGNOSIS — Z48812 Encounter for surgical aftercare following surgery on the circulatory system: Secondary | ICD-10-CM | POA: Diagnosis not present

## 2017-02-13 DIAGNOSIS — I10 Essential (primary) hypertension: Secondary | ICD-10-CM | POA: Diagnosis not present

## 2017-02-14 DIAGNOSIS — Q213 Tetralogy of Fallot: Secondary | ICD-10-CM | POA: Diagnosis not present

## 2017-02-14 DIAGNOSIS — Z952 Presence of prosthetic heart valve: Secondary | ICD-10-CM | POA: Diagnosis not present

## 2017-02-14 DIAGNOSIS — Z7982 Long term (current) use of aspirin: Secondary | ICD-10-CM | POA: Diagnosis not present

## 2017-02-14 DIAGNOSIS — Z79891 Long term (current) use of opiate analgesic: Secondary | ICD-10-CM | POA: Diagnosis not present

## 2017-02-14 DIAGNOSIS — I10 Essential (primary) hypertension: Secondary | ICD-10-CM | POA: Diagnosis not present

## 2017-02-14 DIAGNOSIS — Z48812 Encounter for surgical aftercare following surgery on the circulatory system: Secondary | ICD-10-CM | POA: Diagnosis not present

## 2017-02-14 DIAGNOSIS — E785 Hyperlipidemia, unspecified: Secondary | ICD-10-CM | POA: Diagnosis not present

## 2017-02-15 DIAGNOSIS — Z952 Presence of prosthetic heart valve: Secondary | ICD-10-CM | POA: Diagnosis not present

## 2017-02-15 DIAGNOSIS — Z7982 Long term (current) use of aspirin: Secondary | ICD-10-CM | POA: Diagnosis not present

## 2017-02-15 DIAGNOSIS — Q213 Tetralogy of Fallot: Secondary | ICD-10-CM | POA: Diagnosis not present

## 2017-02-15 DIAGNOSIS — E785 Hyperlipidemia, unspecified: Secondary | ICD-10-CM | POA: Diagnosis not present

## 2017-02-15 DIAGNOSIS — Z48812 Encounter for surgical aftercare following surgery on the circulatory system: Secondary | ICD-10-CM | POA: Diagnosis not present

## 2017-02-15 DIAGNOSIS — I10 Essential (primary) hypertension: Secondary | ICD-10-CM | POA: Diagnosis not present

## 2017-02-15 DIAGNOSIS — Z79891 Long term (current) use of opiate analgesic: Secondary | ICD-10-CM | POA: Diagnosis not present

## 2017-02-18 DIAGNOSIS — I10 Essential (primary) hypertension: Secondary | ICD-10-CM | POA: Diagnosis not present

## 2017-02-18 DIAGNOSIS — Z79891 Long term (current) use of opiate analgesic: Secondary | ICD-10-CM | POA: Diagnosis not present

## 2017-02-18 DIAGNOSIS — Z48812 Encounter for surgical aftercare following surgery on the circulatory system: Secondary | ICD-10-CM | POA: Diagnosis not present

## 2017-02-18 DIAGNOSIS — Q213 Tetralogy of Fallot: Secondary | ICD-10-CM | POA: Diagnosis not present

## 2017-02-18 DIAGNOSIS — Z952 Presence of prosthetic heart valve: Secondary | ICD-10-CM | POA: Diagnosis not present

## 2017-02-18 DIAGNOSIS — Z7982 Long term (current) use of aspirin: Secondary | ICD-10-CM | POA: Diagnosis not present

## 2017-02-18 DIAGNOSIS — E785 Hyperlipidemia, unspecified: Secondary | ICD-10-CM | POA: Diagnosis not present

## 2017-02-22 DIAGNOSIS — Z7982 Long term (current) use of aspirin: Secondary | ICD-10-CM | POA: Diagnosis not present

## 2017-02-22 DIAGNOSIS — Q213 Tetralogy of Fallot: Secondary | ICD-10-CM | POA: Diagnosis not present

## 2017-02-22 DIAGNOSIS — I10 Essential (primary) hypertension: Secondary | ICD-10-CM | POA: Diagnosis not present

## 2017-02-22 DIAGNOSIS — Z79891 Long term (current) use of opiate analgesic: Secondary | ICD-10-CM | POA: Diagnosis not present

## 2017-02-22 DIAGNOSIS — E785 Hyperlipidemia, unspecified: Secondary | ICD-10-CM | POA: Diagnosis not present

## 2017-02-22 DIAGNOSIS — Z952 Presence of prosthetic heart valve: Secondary | ICD-10-CM | POA: Diagnosis not present

## 2017-02-22 DIAGNOSIS — Z48812 Encounter for surgical aftercare following surgery on the circulatory system: Secondary | ICD-10-CM | POA: Diagnosis not present

## 2017-02-27 DIAGNOSIS — Z48812 Encounter for surgical aftercare following surgery on the circulatory system: Secondary | ICD-10-CM | POA: Diagnosis not present

## 2017-02-27 DIAGNOSIS — Z79891 Long term (current) use of opiate analgesic: Secondary | ICD-10-CM | POA: Diagnosis not present

## 2017-02-27 DIAGNOSIS — Z952 Presence of prosthetic heart valve: Secondary | ICD-10-CM | POA: Diagnosis not present

## 2017-02-27 DIAGNOSIS — E785 Hyperlipidemia, unspecified: Secondary | ICD-10-CM | POA: Diagnosis not present

## 2017-02-27 DIAGNOSIS — I10 Essential (primary) hypertension: Secondary | ICD-10-CM | POA: Diagnosis not present

## 2017-02-27 DIAGNOSIS — Q213 Tetralogy of Fallot: Secondary | ICD-10-CM | POA: Diagnosis not present

## 2017-02-27 DIAGNOSIS — Z7982 Long term (current) use of aspirin: Secondary | ICD-10-CM | POA: Diagnosis not present

## 2017-03-04 ENCOUNTER — Telehealth: Payer: Self-pay | Admitting: Internal Medicine

## 2017-03-04 NOTE — Telephone Encounter (Signed)
Michelle [name], case manager from [employer], called to report that Michelle [name] has an open heart surgery recently, pt lives alone and she is having issue remembering what meds she should take. Michelle [name] stated she tried to contact Michelle [name] [facility] [phone]) to follow up about home health but never heard anything from them back. Michelle [name] said she is out of lasix (pt has all her med transfer to [facility] now) but this is not on her med list. She is concern about Michelle [name] and request a call back of what Dr. Ronnald Ramp suggest to do.   Called pt, got an appt with Dr. Ronnald Ramp on 03/11/2017 (pt seem confuse about her med a little and there is no opening w/ Dr. Ronnald Ramp until Monday). Do we need to bring her in sooner to see someone else for this or can Dr. Ronnald Ramp work her in this week? Please advise.

## 2017-03-04 NOTE — Telephone Encounter (Signed)
appt is set.  °

## 2017-03-04 NOTE — Telephone Encounter (Signed)
Can not leave vm. Per Dr. Ronnald Ramp she need to come in today or tomorrow to see him (he will work her in). Will try to call again.

## 2017-03-04 NOTE — Telephone Encounter (Signed)
Yes, she needs to be seen this week

## 2017-03-04 NOTE — Telephone Encounter (Signed)
Also left detail massage inform [facility].

## 2017-03-05 ENCOUNTER — Other Ambulatory Visit (INDEPENDENT_AMBULATORY_CARE_PROVIDER_SITE_OTHER): Payer: Medicare HMO

## 2017-03-05 ENCOUNTER — Ambulatory Visit (INDEPENDENT_AMBULATORY_CARE_PROVIDER_SITE_OTHER): Payer: Medicare HMO | Admitting: Internal Medicine

## 2017-03-05 ENCOUNTER — Encounter: Payer: Self-pay | Admitting: Internal Medicine

## 2017-03-05 VITALS — BP 124/82 | HR 95 | Temp 97.6°F | Resp 16 | Ht 62.0 in | Wt 121.0 lb

## 2017-03-05 DIAGNOSIS — I10 Essential (primary) hypertension: Secondary | ICD-10-CM

## 2017-03-05 DIAGNOSIS — E039 Hypothyroidism, unspecified: Secondary | ICD-10-CM | POA: Diagnosis not present

## 2017-03-05 DIAGNOSIS — Z9889 Other specified postprocedural states: Secondary | ICD-10-CM | POA: Diagnosis not present

## 2017-03-05 DIAGNOSIS — R413 Other amnesia: Secondary | ICD-10-CM | POA: Diagnosis not present

## 2017-03-05 DIAGNOSIS — Z952 Presence of prosthetic heart valve: Secondary | ICD-10-CM | POA: Diagnosis not present

## 2017-03-05 LAB — CBC WITH DIFFERENTIAL/PLATELET
BASOS ABS: 0.1 10*3/uL (ref 0.0–0.1)
Basophils Relative: 0.8 % (ref 0.0–3.0)
EOS ABS: 0.3 10*3/uL (ref 0.0–0.7)
Eosinophils Relative: 3.4 % (ref 0.0–5.0)
HCT: 33.7 % — ABNORMAL LOW (ref 36.0–46.0)
Hemoglobin: 11.2 g/dL — ABNORMAL LOW (ref 12.0–15.0)
Lymphocytes Relative: 17.6 % (ref 12.0–46.0)
Lymphs Abs: 1.6 10*3/uL (ref 0.7–4.0)
MCHC: 33.2 g/dL (ref 30.0–36.0)
MCV: 87.3 fl (ref 78.0–100.0)
Monocytes Absolute: 0.6 10*3/uL (ref 0.1–1.0)
Monocytes Relative: 6.3 % (ref 3.0–12.0)
NEUTROS ABS: 6.6 10*3/uL (ref 1.4–7.7)
NEUTROS PCT: 71.9 % (ref 43.0–77.0)
PLATELETS: 310 10*3/uL (ref 150.0–400.0)
RBC: 3.86 Mil/uL — ABNORMAL LOW (ref 3.87–5.11)
RDW: 14 % (ref 11.5–15.5)
WBC: 9.2 10*3/uL (ref 4.0–10.5)

## 2017-03-05 LAB — BASIC METABOLIC PANEL
BUN: 12 mg/dL (ref 6–23)
CALCIUM: 9.6 mg/dL (ref 8.4–10.5)
CO2: 27 mEq/L (ref 19–32)
Chloride: 104 mEq/L (ref 96–112)
Creatinine, Ser: 1.07 mg/dL (ref 0.40–1.20)
GFR: 54.36 mL/min — AB (ref >60.00)
Glucose, Bld: 92 mg/dL (ref 70–99)
POTASSIUM: 5 meq/L (ref 3.5–5.1)
SODIUM: 138 meq/L (ref 135–145)

## 2017-03-05 LAB — TSH: TSH: 4.59 u[IU]/mL — ABNORMAL HIGH (ref 0.35–4.50)
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
## 2017-03-05 NOTE — Patient Instructions (Signed)
Hypothyroidism Hypothyroidism is a disorder of the thyroid. The thyroid is a large gland that is located in the lower front of the neck. The thyroid releases hormones that control how the body works. With hypothyroidism, the thyroid does not make enough of these hormones. What are the causes? Causes of hypothyroidism may include:  Viral infections.  Pregnancy.  Your own defense system (immune system) attacking your thyroid.  Certain medicines.  Birth defects.  Past radiation treatments to your head or neck.  Past treatment with radioactive iodine.  Past surgical removal of part or all of your thyroid.  Problems with the gland that is located in the center of your brain (pituitary).  What are the signs or symptoms? Signs and symptoms of hypothyroidism may include:  Feeling as though you have no energy (lethargy).  Inability to tolerate cold.  Weight gain that is not explained by a change in diet or exercise habits.  Dry skin.  Coarse hair.  Menstrual irregularity.  Slowing of thought processes.  Constipation.  Sadness or depression.  How is this diagnosed? Your health care provider may diagnose hypothyroidism with blood tests and ultrasound tests. How is this treated? Hypothyroidism is treated with medicine that replaces the hormones that your body does not make. After you begin treatment, it may take several weeks for symptoms to go away. Follow these instructions at home:  Take medicines only as directed by your health care provider.  If you start taking any new medicines, tell your health care provider.  Keep all follow-up visits as directed by your health care provider. This is important. As your condition improves, your dosage needs may change. You will need to have blood tests regularly so that your health care provider can watch your condition. Contact a health care provider if:  Your symptoms do not get better with treatment.  You are taking thyroid  replacement medicine and: ? You sweat excessively. ? You have tremors. ? You feel anxious. ? You lose weight rapidly. ? You cannot tolerate heat. ? You have emotional swings. ? You have diarrhea. ? You feel weak. Get help right away if:  You develop chest pain.  You develop an irregular heartbeat.  You develop a rapid heartbeat. This information is not intended to replace advice given to you by your health care provider. Make sure you discuss any questions you have with your health care provider. Document Released: 12/03/2005 Document Revised: 05/10/2016 Document Reviewed: 04/20/2014 Elsevier Interactive Patient Education  2017 [employer]  

## 2017-03-05 NOTE — Progress Notes (Signed)
Pre visit review using our clinic review tool, if applicable. No additional management support is needed unless otherwise documented below in the visit note. 

## 2017-03-06 DIAGNOSIS — Z952 Presence of prosthetic heart valve: Secondary | ICD-10-CM | POA: Diagnosis not present

## 2017-03-06 DIAGNOSIS — Q213 Tetralogy of Fallot: Secondary | ICD-10-CM | POA: Diagnosis not present

## 2017-03-06 DIAGNOSIS — Z48812 Encounter for surgical aftercare following surgery on the circulatory system: Secondary | ICD-10-CM | POA: Diagnosis not present

## 2017-03-06 DIAGNOSIS — Z79891 Long term (current) use of opiate analgesic: Secondary | ICD-10-CM | POA: Diagnosis not present

## 2017-03-06 DIAGNOSIS — Z7982 Long term (current) use of aspirin: Secondary | ICD-10-CM | POA: Diagnosis not present

## 2017-03-06 DIAGNOSIS — E785 Hyperlipidemia, unspecified: Secondary | ICD-10-CM | POA: Diagnosis not present

## 2017-03-06 DIAGNOSIS — I10 Essential (primary) hypertension: Secondary | ICD-10-CM | POA: Diagnosis not present

## 2017-03-11 ENCOUNTER — Ambulatory Visit: Payer: Medicare HMO | Admitting: Internal Medicine

## 2017-03-11 ENCOUNTER — Telehealth: Payer: Self-pay

## 2017-03-11 NOTE — Telephone Encounter (Signed)
Dentist called and wanted to know if pt should medicate prior to procedure.   Spoke to Dr. Ronnald Ramp and he stated yes to do pt cardiac history.

## 2017-03-12 ENCOUNTER — Encounter: Payer: Self-pay | Admitting: Neurology

## 2017-03-20 ENCOUNTER — Encounter: Payer: Self-pay | Admitting: Internal Medicine

## 2017-03-20 ENCOUNTER — Ambulatory Visit (INDEPENDENT_AMBULATORY_CARE_PROVIDER_SITE_OTHER): Payer: Medicare HMO | Admitting: Internal Medicine

## 2017-03-20 VITALS — BP 128/82 | HR 84 | Temp 98.1°F | Resp 16 | Ht 62.0 in | Wt 122.0 lb

## 2017-03-20 DIAGNOSIS — Z952 Presence of prosthetic heart valve: Secondary | ICD-10-CM | POA: Diagnosis not present

## 2017-03-20 DIAGNOSIS — Z79891 Long term (current) use of opiate analgesic: Secondary | ICD-10-CM | POA: Diagnosis not present

## 2017-03-20 DIAGNOSIS — Z7982 Long term (current) use of aspirin: Secondary | ICD-10-CM | POA: Diagnosis not present

## 2017-03-20 DIAGNOSIS — I10 Essential (primary) hypertension: Secondary | ICD-10-CM | POA: Diagnosis not present

## 2017-03-20 DIAGNOSIS — Z48812 Encounter for surgical aftercare following surgery on the circulatory system: Secondary | ICD-10-CM | POA: Diagnosis not present

## 2017-03-20 DIAGNOSIS — Q828 Other specified congenital malformations of skin: Secondary | ICD-10-CM

## 2017-03-20 DIAGNOSIS — E785 Hyperlipidemia, unspecified: Secondary | ICD-10-CM | POA: Diagnosis not present

## 2017-03-20 DIAGNOSIS — R600 Localized edema: Secondary | ICD-10-CM

## 2017-03-20 DIAGNOSIS — Q213 Tetralogy of Fallot: Secondary | ICD-10-CM | POA: Diagnosis not present

## 2017-03-20 MED ORDER — TORSEMIDE 10 MG PO TABS: 10.0000 mg | ORAL_TABLET | Freq: Every day | ORAL | 1 refills | Status: DC

## 2017-03-20 NOTE — Progress Notes (Signed)
Pre visit review using our clinic review tool, if applicable. No additional management support is needed unless otherwise documented below in the visit note. 

## 2017-03-20 NOTE — Progress Notes (Signed)
Subjective:  Patient ID: [name], female    DOB: 12-Nov-1951  Age: 66 y.o. MRN: [id number]  CC: Leg Swelling   HPI SADE HOLLON presents for concerns about pitting edema around both ankles and feet. She is status post pulmonic valve replacement and mitral valve repair at [facility] and developed peripheral edema postop. After discharge from [facility] she took a loop diuretic for a while but then stopped taking it. She now complains that the pitting edema in her legs and feet has returned however she denies chest pain, shortness of breath, palpitations, or fatigue.  She complains of rash and itching and is having a flareup of her dermatological disease and tells me is being treated by her dermatologist.  Outpatient Medications Prior to Visit  Medication Sig Dispense Refill  . amitriptyline (ELAVIL) 50 MG tablet Take 1 tablet (50 mg total) by mouth at bedtime. 90 tablet 3  . aspirin 81 MG tablet Take 81 mg by mouth daily.      . cholecalciferol (VITAMIN D) 1000 UNITS tablet Take 2,000 Units by mouth daily.    Marland Kitchen levothyroxine (SYNTHROID, LEVOTHROID) 50 MCG tablet Take 1 tablet (50 mcg total) by mouth daily. 30 tablet 2  . losartan (COZAAR) 50 MG tablet Take 1 tablet (50 mg total) by mouth daily. 30 tablet 11  . Omega-3 Fatty Acids (FISH OIL) 1000 MG CAPS Take 1 capsule by mouth 2 (two) times daily.    Marland Kitchen triamcinolone cream (KENALOG) 0.1 %     . vitamin E 1000 UNIT capsule Take 1,000 Units by mouth daily.     No facility-administered medications prior to visit.     ROS Review of Systems  Constitutional: Negative for appetite change, diaphoresis, fatigue and unexpected weight change.  HENT: Negative.   Eyes: Negative.  Negative for visual disturbance.  Respiratory: Negative for cough, chest tightness, shortness of breath and wheezing.   Cardiovascular: Positive for leg swelling. Negative for chest pain and palpitations.  Gastrointestinal: Negative for abdominal pain, constipation,  diarrhea, nausea and vomiting.  Endocrine: Negative.   Genitourinary: Negative.  Negative for decreased urine volume, difficulty urinating and urgency.  Musculoskeletal: Negative.  Negative for back pain, myalgias and neck pain.  Skin: Positive for rash.  Allergic/Immunologic: Negative.   Neurological: Negative.  Negative for dizziness, weakness and light-headedness.  Hematological: Negative for adenopathy. Does not bruise/bleed easily.  Psychiatric/Behavioral: Negative.     Objective:  BP 128/82 (BP Location: Right Arm, Patient Position: Sitting, Cuff Size: Normal)   Pulse 84   Temp 98.1 F (36.7 C) (Oral)   Resp 16   Ht 5\' 2"  (1.575 m)   Wt 122 lb 0.6 oz (55.4 kg)   SpO2 97%   BMI 22.32 kg/m   BP Readings from Last 3 Encounters:  03/20/17 128/82  03/05/17 124/82  01/22/17 (!) 162/100    Wt Readings from Last 3 Encounters:  03/20/17 122 lb 0.6 oz (55.4 kg)  03/05/17 121 lb 0.6 oz (54.9 kg)  01/22/17 132 lb 4 oz (60 kg)    Physical Exam  Constitutional: She is oriented to person, place, and time. No distress.  HENT:  Mouth/Throat: Oropharynx is clear and moist. No oropharyngeal exudate.  Eyes: Conjunctivae are normal. Right eye exhibits no discharge. Left eye exhibits no discharge. No scleral icterus.  Neck: Normal range of motion. Neck supple. No JVD present. No tracheal deviation present. No thyromegaly present.  Cardiovascular: Normal rate, regular rhythm, normal heart sounds and intact distal pulses.  Exam reveals no gallop and no friction rub.   No murmur heard. Pulmonary/Chest: Effort normal and breath sounds normal. No stridor. No respiratory distress. She has no wheezes. She has no rales. She exhibits no tenderness.  Abdominal: Soft. Bowel sounds are normal. She exhibits no distension and no mass. There is no tenderness. There is no rebound and no guarding.  Musculoskeletal: Normal range of motion. She exhibits edema (trace pitting edema over both ankles). She  exhibits no tenderness or deformity.  Lymphadenopathy:    She has no cervical adenopathy.  Neurological: She is oriented to person, place, and time.  Skin: Skin is warm and dry. Rash noted. Rash is papular. Rash is not pustular, not vesicular and not urticarial. She is not diaphoretic. No erythema. No pallor.  Her torso and lower extremities are covered with excoriated, slightly erythematous papules that are too numerous to count  Vitals reviewed.   Lab Results  Component Value Date   WBC 9.2 03/05/2017   HGB 11.2 (L) 03/05/2017   HCT 33.7 (L) 03/05/2017   PLT 310.0 03/05/2017   GLUCOSE 92 03/05/2017   CHOL 190 07/30/2016   TRIG 119.0 07/30/2016   HDL 48.70 07/30/2016   LDLDIRECT 113 (H) 06/16/2008   LDLCALC 118 (H) 07/30/2016   ALT 20 11/07/2016   AST 25 11/07/2016   NA 138 03/05/2017   K 5.0 03/05/2017   CL 104 03/05/2017   CREATININE 1.07 03/05/2017   BUN 12 03/05/2017   CO2 27 03/05/2017   TSH 4.59 (H) 03/05/2017   INR 1.05 06/26/2016    Mm Screening Breast Tomo Bilateral  Result Date: 08/16/2016 CLINICAL DATA:  Screening. EXAM: 2D DIGITAL SCREENING BILATERAL MAMMOGRAM WITH CAD AND ADJUNCT TOMO COMPARISON:  Previous exam(s). ACR Breast Density Category d: The breast tissue is extremely dense, which lowers the sensitivity of mammography. FINDINGS: There are no findings suspicious for malignancy. Images were processed with CAD. IMPRESSION: No mammographic evidence of malignancy. A result letter of this screening mammogram will be mailed directly to the patient. RECOMMENDATION: Screening mammogram in one year. (Code:SM-B-01Y) BI-RADS CATEGORY  1: Negative. Electronically Signed   By: Everlean Alstrom M.D.   On: 08/17/2016 08:02    Assessment & Plan:   Clevie was seen today for leg swelling.  Diagnoses and all orders for this visit:  Localized edema- will restart a loop diuretic -     torsemide (DEMADEX) 10 MG tablet; Take 1 tablet (10 mg total) by mouth  daily.  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   I am having Ms. Whitehead start on torsemide. I am also having her maintain her aspirin, cholecalciferol, vitamin E, Fish Oil, losartan, triamcinolone cream, amitriptyline, levothyroxine, simvastatin, and Ginseng.  Meds ordered this encounter  Medications  . simvastatin (ZOCOR) 10 MG tablet    Sig: Take 1 tablet by mouth daily.  . Ginseng 100 MG CAPS    Sig: Take by mouth.  . DISCONTD: vitamin E 1000 UNIT capsule    Sig: Take by mouth.  . torsemide (DEMADEX) 10 MG tablet    Sig: Take 1 tablet (10 mg total) by mouth daily.    Dispense:  90 tablet    Refill:  1     Follow-up: Return if symptoms worsen or fail to improve.  [name], MD

## 2017-03-20 NOTE — Patient Instructions (Signed)
Edema Edema is an abnormal buildup of fluids in your bodytissues. Edema is somewhatdependent on gravity to pull the fluid to the lowest place in your body. That makes the condition more common in the legs and thighs (lower extremities). Painless swelling of the feet and ankles is common and becomes more likely as you get older. It is also common in looser tissues, like around your eyes. When the affected area is squeezed, the fluid may move out of that spot and leave a dent for a few moments. This dent is called pitting. What are the causes? There are many possible causes of edema. Eating too much salt and being on your feet or sitting for a long time can cause edema in your legs and ankles. Hot weather may make edema worse. Common medical causes of edema include:  Heart failure.  Liver disease.  Kidney disease.  Weak blood vessels in your legs.  Cancer.  An injury.  Pregnancy.  Some medications.  Obesity.  What are the signs or symptoms? Edema is usually painless.Your skin may look swollen or shiny. How is this diagnosed? Your health care provider may be able to diagnose edema by asking about your medical history and doing a physical exam. You may need to have tests such as X-rays, an electrocardiogram, or blood tests to check for medical conditions that may cause edema. How is this treated? Edema treatment depends on the cause. If you have heart, liver, or kidney disease, you need the treatment appropriate for these conditions. General treatment may include:  Elevation of the affected body part above the level of your heart.  Compression of the affected body part. Pressure from elastic bandages or support stockings squeezes the tissues and forces fluid back into the blood vessels. This keeps fluid from entering the tissues.  Restriction of fluid and salt intake.  Use of a water pill (diuretic). These medications are appropriate only for some types of edema. They pull fluid  out of your body and make you urinate more often. This gets rid of fluid and reduces swelling, but diuretics can have side effects. Only use diuretics as directed by your health care provider.  Follow these instructions at home:  Keep the affected body part above the level of your heart when you are lying down.  Do not sit still or stand for prolonged periods.  Do not put anything directly under your knees when lying down.  Do not wear constricting clothing or garters on your upper legs.  Exercise your legs to work the fluid back into your blood vessels. This may help the swelling go down.  Wear elastic bandages or support stockings to reduce ankle swelling as directed by your health care provider.  Eat a low-salt diet to reduce fluid if your health care provider recommends it.  Only take medicines as directed by your health care provider. Contact a health care provider if:  Your edema is not responding to treatment.  You have heart, liver, or kidney disease and notice symptoms of edema.  You have edema in your legs that does not improve after elevating them.  You have sudden and unexplained weight gain. Get help right away if:  You develop shortness of breath or chest pain.  You cannot breathe when you lie down.  You develop pain, redness, or warmth in the swollen areas.  You have heart, liver, or kidney disease and suddenly get edema.  You have a fever and your symptoms suddenly get worse. This information is   not intended to replace advice given to you by your health care provider. Make sure you discuss any questions you have with your health care provider. Document Released: 12/03/2005 Document Revised: 05/10/2016 Document Reviewed: 09/25/2013 Elsevier Interactive Patient Education  2017 Elsevier Inc.  

## 2017-03-31 ENCOUNTER — Other Ambulatory Visit: Payer: Self-pay | Admitting: Internal Medicine

## 2017-03-31 DIAGNOSIS — R7989 Other specified abnormal findings of blood chemistry: Secondary | ICD-10-CM

## 2017-03-31 DIAGNOSIS — E039 Hypothyroidism, unspecified: Secondary | ICD-10-CM

## 2017-04-25 DIAGNOSIS — Z9889 Other specified postprocedural states: Secondary | ICD-10-CM | POA: Diagnosis not present

## 2017-04-25 DIAGNOSIS — Z952 Presence of prosthetic heart valve: Secondary | ICD-10-CM | POA: Diagnosis not present

## 2017-04-25 DIAGNOSIS — M7989 Other specified soft tissue disorders: Secondary | ICD-10-CM | POA: Diagnosis not present

## 2017-04-25 DIAGNOSIS — I10 Essential (primary) hypertension: Secondary | ICD-10-CM | POA: Diagnosis not present

## 2017-04-25 DIAGNOSIS — Q213 Tetralogy of Fallot: Secondary | ICD-10-CM | POA: Diagnosis not present

## 2017-04-26 ENCOUNTER — Other Ambulatory Visit (HOSPITAL_COMMUNITY): Payer: Self-pay | Admitting: Pediatric Cardiology

## 2017-04-26 DIAGNOSIS — R609 Edema, unspecified: Secondary | ICD-10-CM

## 2017-04-28 ENCOUNTER — Other Ambulatory Visit: Payer: Self-pay | Admitting: Internal Medicine

## 2017-04-28 DIAGNOSIS — R7989 Other specified abnormal findings of blood chemistry: Secondary | ICD-10-CM

## 2017-04-28 DIAGNOSIS — E039 Hypothyroidism, unspecified: Secondary | ICD-10-CM

## 2017-04-29 ENCOUNTER — Ambulatory Visit (HOSPITAL_COMMUNITY)
Admission: RE | Admit: 2017-04-29 | Discharge: 2017-04-29 | Disposition: A | Discharge status: Home / Self Care | Payer: Medicare HMO | Source: Ambulatory Visit | Attending: Pediatric Cardiology | Admitting: Pediatric Cardiology

## 2017-04-29 DIAGNOSIS — R609 Edema, unspecified: Secondary | ICD-10-CM

## 2017-04-29 DIAGNOSIS — M7989 Other specified soft tissue disorders: Secondary | ICD-10-CM | POA: Diagnosis not present

## 2017-04-29 NOTE — Progress Notes (Signed)
*  PRELIMINARY RESULTS* Vascular Ultrasound Left lower extremity venous duplex has been completed.  Preliminary findings: No evidence of deep vein thrombosis in the visualized veins of the left lower extremity.negative for baker's cyst.  Preliminary report called to Dr. Dillon Bjork office, given to [facility] @ 11:00   Everrett Coombe 04/29/2017, 11:10 AM

## 2017-04-30 ENCOUNTER — Telehealth: Payer: Self-pay | Admitting: Internal Medicine

## 2017-04-30 NOTE — Telephone Encounter (Signed)
Pt called and said that her heart doctor told her that she needed to have blood work done through Dr Ronnald Ramp. She did not know what it was for. I told her that Dr Ronnald Ramp was not in the office this week but that I would let you know and we would get back with her on what to do. Please advise.

## 2017-05-01 NOTE — Telephone Encounter (Signed)
Can you ask pt to find out what labs they are needing?  SHe had labs drawn by [facility] in February.

## 2017-05-01 NOTE — Telephone Encounter (Signed)
Pt said that she will check with the heart doctor to see what labs they were needing and will call us back.

## 2017-05-02 ENCOUNTER — Telehealth (HOSPITAL_COMMUNITY): Payer: Self-pay

## 2017-05-02 NOTE — Telephone Encounter (Signed)
Verified [employer] insurance benefits through [employer]. Copay $45, No coinsurance or Deductible. Out of Pocket $4500.00, pt has met $1656.67... Reference [id number].... KJ

## 2017-05-09 ENCOUNTER — Other Ambulatory Visit: Payer: Medicare HMO

## 2017-05-09 ENCOUNTER — Encounter: Payer: Self-pay | Admitting: Neurology

## 2017-05-09 ENCOUNTER — Ambulatory Visit (INDEPENDENT_AMBULATORY_CARE_PROVIDER_SITE_OTHER): Payer: Medicare HMO | Admitting: Neurology

## 2017-05-09 VITALS — BP 110/62 | HR 81 | Ht 64.0 in | Wt 124.0 lb

## 2017-05-09 DIAGNOSIS — R413 Other amnesia: Secondary | ICD-10-CM | POA: Diagnosis not present

## 2017-05-09 DIAGNOSIS — R51 Headache: Secondary | ICD-10-CM

## 2017-05-09 DIAGNOSIS — R519 Headache, unspecified: Secondary | ICD-10-CM

## 2017-05-09 LAB — TSH: TSH: 3.4 mIU/L

## 2017-05-09 NOTE — Patient Instructions (Signed)
1. Schedule open MRI brain without contrast 2. Bloodwork for B12, TSH 3. Schedule for Neurocognitive testing with Dr. Si Raider 4. Follow-up in 3 months  You have been referred for a neurocognitive evaluation in our office.   The evaluation takes approximately two hours. The first part of the appointment is a clinical interview with the neuropsychologist (Dr. Macarthur Critchley). Please bring someone with you to this appointment if possible, as it is helpful for Dr. Si Raider to hear from both you and another adult who knows you well. After speaking with Dr. Si Raider, you will complete testing with her technician. The testing includes a variety of tasks- mostly question-and-answer, some paper-and-pencil. There is nothing you need to do to prepare for this appointment, but having a good night's sleep prior to the testing, and bringing eyeglasses and hearing aids (if you wear them), is advised.   About a week after the evaluation, you will return to follow up with Dr. Si Raider to review the test results. This appointment is about 30 minutes. If you would like a family member to receive this information as well, please bring them to the appointment.   We have to reserve several hours of the neuropsychologist's time and the psychometrician's time for your evaluation appointment. As such, please note that there is a No-Show fee of $100. If you are unable to attend any of your appointments, please contact our office as soon as possible to reschedule.
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
## 2017-05-10 LAB — VITAMIN B12: Vitamin B-12: 497 pg/mL (ref 200–1100)

## 2017-05-14 DIAGNOSIS — Z79899 Other long term (current) drug therapy: Secondary | ICD-10-CM | POA: Diagnosis not present

## 2017-05-14 DIAGNOSIS — I10 Essential (primary) hypertension: Secondary | ICD-10-CM | POA: Diagnosis not present

## 2017-05-14 DIAGNOSIS — K08409 Partial loss of teeth, unspecified cause, unspecified class: Secondary | ICD-10-CM | POA: Diagnosis not present

## 2017-05-14 DIAGNOSIS — Z0000 Encounter for general adult medical examination without abnormal findings: Secondary | ICD-10-CM | POA: Diagnosis not present

## 2017-05-14 DIAGNOSIS — E7800 Pure hypercholesterolemia, unspecified: Secondary | ICD-10-CM | POA: Diagnosis not present

## 2017-05-14 DIAGNOSIS — R600 Localized edema: Secondary | ICD-10-CM | POA: Diagnosis not present

## 2017-05-14 DIAGNOSIS — G479 Sleep disorder, unspecified: Secondary | ICD-10-CM | POA: Diagnosis not present

## 2017-05-14 DIAGNOSIS — E039 Hypothyroidism, unspecified: Secondary | ICD-10-CM | POA: Diagnosis not present

## 2017-05-14 DIAGNOSIS — Z6824 Body mass index (BMI) 24.0-24.9, adult: Secondary | ICD-10-CM | POA: Diagnosis not present

## 2017-05-14 DIAGNOSIS — Q828 Other specified congenital malformations of skin: Secondary | ICD-10-CM | POA: Diagnosis not present

## 2017-05-15 ENCOUNTER — Encounter: Payer: Self-pay | Admitting: Neurology

## 2017-05-15 DIAGNOSIS — I8310 Varicose veins of unspecified lower extremity with inflammation: Secondary | ICD-10-CM | POA: Diagnosis not present

## 2017-05-15 DIAGNOSIS — Q828 Other specified congenital malformations of skin: Secondary | ICD-10-CM | POA: Diagnosis not present

## 2017-05-16 ENCOUNTER — Telehealth: Payer: Self-pay

## 2017-05-16 NOTE — Telephone Encounter (Signed)
-----   Message from Cameron Sprang, MD sent at 05/14/2017  4:45 PM EDT ----- Pls let him know thyroid and B12 levels are normal. Thanks

## 2017-05-16 NOTE — Telephone Encounter (Signed)
Relayed message below to pt.

## 2017-05-17 ENCOUNTER — Telehealth (HOSPITAL_COMMUNITY): Payer: Self-pay | Admitting: Pharmacist

## 2017-05-17 NOTE — Telephone Encounter (Signed)
Cardiac Rehab Medication Review by a Pharmacist  Does the patient  feel that his/her medications are working for him/her?  yes  Has the patient been experiencing any side effects to the medications prescribed?  no  Does the patient measure his/her own blood pressure or blood glucose at home?  no   Does the patient have any problems obtaining medications due to transportation or finances?   no, doesn't have a car but has a friend to help her get to pharmacy/doctors office visits   Understanding of regimen: poor Understanding of indications: poor Potential of compliance: poor  Pharmacist comments: Patient is a 66 yo female who was called to complete medication history prior to her first cardiac rehab session. She does seem to have some trouble remembering medications she is taking (stated she has memory loss), but does use a pill box to help her with this. While on the phone, she also mentioned that her simvastatin pill bottle was empty, so I recommended she call her pharmacy right away to request refills. She also stated that she does have a blood pressure cuff but is unsure if it is working correctly, so I recommended she bring it to her next physician office visit or her pharmacy to check to see if it is still accurate.   Demetrius Charity, PharmD Acute Care Pharmacy Resident  Pager: [phone] 05/17/2017

## 2017-05-17 NOTE — Telephone Encounter (Signed)
Cardiac Rehab - Pharmacist Documentation   Attempted to call patient three times for medication history with no response. Message was unable to be left due to full voicemail. Please complete medication history and documentation on day of patient's cardiac rehab appointment.   Demetrius Charity, PharmD Acute Care Pharmacy Resident  Pager: [phone] 05/17/2017

## 2017-05-20 ENCOUNTER — Telehealth (HOSPITAL_COMMUNITY): Payer: Self-pay

## 2017-05-20 NOTE — Telephone Encounter (Signed)
*  Updated insurance information[employer] - $45.00 co-payment, no deductible, out of pocket max $4500/$1796.67 has been met, no pre-authorization and no limit on visit. Passport/reference [id number].

## 2017-05-21 ENCOUNTER — Encounter (HOSPITAL_COMMUNITY)
Admission: RE | Admit: 2017-05-21 | Discharge: 2017-05-21 | Disposition: A | Discharge status: Home / Self Care | Payer: Medicare HMO | Source: Ambulatory Visit | Attending: Cardiology | Admitting: Cardiology

## 2017-05-21 NOTE — Progress Notes (Signed)
Pt in today for cardiac rehab orientation s/p valve surgery at [facility]. Pt presented late due to her ride arriving late to pick her up. Insurance information reviewed with pt.  Pt has [employer] and has a 45.00 co pay per exercise session.  Pt can not financially afford to pay this.  Pt is on a fixed income and receives 400.00 a month to live on. Offered pt maintenance program which is self pay at 68.00.  Pt can not afford this either. Pt does not wish to proceed with cardiac rehab.  Pt became teary eyed and feeling overwhelmed.  Pt admits she has fuzzy thinking and feels foggy with remembering thing.  Will advise Dr. Corine Shelter office of pt inability to participate in cardiac rehab. Pt has silver sneakers, pt advised to pursue this as an insurance benefit for continued exercise.  Pt significant other called and informed to please return to pick her up. Able to talk with Maryln Gottron and he is on his way back to the hospital. Escorted pt to the main entrance of the hospital.  Pt tolerated well and appreciated staff for their assistance. Cherre Huger, BSN Cardiac and Training and development officer

## 2017-05-27 ENCOUNTER — Ambulatory Visit (HOSPITAL_COMMUNITY): Payer: Medicare HMO

## 2017-05-27 ENCOUNTER — Telehealth: Payer: Self-pay | Admitting: Internal Medicine

## 2017-05-27 NOTE — Telephone Encounter (Signed)
Patient requesting call back to review medications sent to her pharmacy today.

## 2017-05-27 NOTE — Telephone Encounter (Signed)
We did not send any medications to patients pharmacy today. Please send to appropriate assistant

## 2017-05-28 NOTE — Telephone Encounter (Signed)
Notified patient.

## 2017-05-28 NOTE — Telephone Encounter (Signed)
Losartan potassium is the BP med that we have she should be taking. NOT the losartan with HCTZ.

## 2017-05-28 NOTE — Telephone Encounter (Signed)
Patient states she is very confused.  States she has two bottles:  One reads losartan HCTZ and the other reads Losartan Potassium.  Patient is not sure if Dr. Ronnald Ramp prescribed both of these for her or not.  States she has both of these after heart surgery.  She does not know which one she needs to be taking.

## 2017-05-29 ENCOUNTER — Ambulatory Visit (HOSPITAL_COMMUNITY): Payer: Medicare HMO

## 2017-05-31 ENCOUNTER — Ambulatory Visit (HOSPITAL_COMMUNITY): Payer: Medicare HMO

## 2017-05-31 ENCOUNTER — Other Ambulatory Visit: Payer: Self-pay | Admitting: Internal Medicine

## 2017-05-31 DIAGNOSIS — E039 Hypothyroidism, unspecified: Secondary | ICD-10-CM

## 2017-05-31 DIAGNOSIS — R7989 Other specified abnormal findings of blood chemistry: Secondary | ICD-10-CM

## 2017-06-03 ENCOUNTER — Other Ambulatory Visit: Payer: Medicare HMO

## 2017-06-03 ENCOUNTER — Ambulatory Visit (HOSPITAL_COMMUNITY): Payer: Medicare HMO

## 2017-06-05 ENCOUNTER — Ambulatory Visit (HOSPITAL_COMMUNITY): Payer: Medicare HMO

## 2017-06-05 ENCOUNTER — Encounter: Payer: Self-pay | Admitting: Internal Medicine

## 2017-06-05 ENCOUNTER — Ambulatory Visit (INDEPENDENT_AMBULATORY_CARE_PROVIDER_SITE_OTHER): Payer: Medicare HMO | Admitting: Internal Medicine

## 2017-06-05 ENCOUNTER — Other Ambulatory Visit (INDEPENDENT_AMBULATORY_CARE_PROVIDER_SITE_OTHER): Payer: Medicare HMO

## 2017-06-05 VITALS — BP 118/80 | HR 77 | Temp 97.8°F | Resp 16 | Ht 64.0 in | Wt 122.6 lb

## 2017-06-05 DIAGNOSIS — D539 Nutritional anemia, unspecified: Secondary | ICD-10-CM

## 2017-06-05 DIAGNOSIS — M79662 Pain in left lower leg: Secondary | ICD-10-CM | POA: Diagnosis not present

## 2017-06-05 DIAGNOSIS — R7989 Other specified abnormal findings of blood chemistry: Secondary | ICD-10-CM | POA: Diagnosis not present

## 2017-06-05 DIAGNOSIS — M7989 Other specified soft tissue disorders: Secondary | ICD-10-CM

## 2017-06-05 LAB — CBC WITH DIFFERENTIAL/PLATELET
BASOS PCT: 0.5 % (ref 0.0–3.0)
Basophils Absolute: 0 10*3/uL (ref 0.0–0.1)
EOS PCT: 3.7 % (ref 0.0–5.0)
Eosinophils Absolute: 0.3 10*3/uL (ref 0.0–0.7)
HEMATOCRIT: 36.3 % (ref 36.0–46.0)
HEMOGLOBIN: 12.1 g/dL (ref 12.0–15.0)
LYMPHS PCT: 19.2 % (ref 12.0–46.0)
Lymphs Abs: 1.7 10*3/uL (ref 0.7–4.0)
MCHC: 33.3 g/dL (ref 30.0–36.0)
MCV: 83.6 fl (ref 78.0–100.0)
MONO ABS: 0.6 10*3/uL (ref 0.1–1.0)
Monocytes Relative: 6.5 % (ref 3.0–12.0)
Neutro Abs: 6.1 10*3/uL (ref 1.4–7.7)
Neutrophils Relative %: 70.1 % (ref 43.0–77.0)
Platelets: 310 10*3/uL (ref 150.0–400.0)
RBC: 4.34 Mil/uL (ref 3.87–5.11)
RDW: 14.7 % (ref 11.5–15.5)
WBC: 8.7 10*3/uL (ref 4.0–10.5)

## 2017-06-05 LAB — IBC PANEL
Iron: 65 ug/dL (ref 42–145)
Saturation Ratios: 15.7 % — ABNORMAL LOW (ref 20.0–50.0)
Transferrin: 295 mg/dL (ref 212.0–360.0)

## 2017-06-05 LAB — FERRITIN: Ferritin: 25.1 ng/mL (ref 10.0–291.0)

## 2017-06-05 LAB — FOLATE

## 2017-06-05 NOTE — Progress Notes (Signed)
Subjective:  Patient ID: Michelle [name], female    DOB: [birth year]  Age: 66 y.o. MRN: [id number]  CC: Anemia   HPI AYDE RECORD presents for f/up - She has been confused about her antihypertensives and in addition to taking losartan and furosemide she has also been adding on a combination of losartan HCTZ. She complains of left lower extremity pain and swelling for several weeks but she denies any recent episodes of dizziness, lightheadedness, chest pain, shortness of breath, palpitations, or syncope. She is having a flareup of her rash and will be seeing her dermatologist in.  Outpatient Medications Prior to Visit  Medication Sig Dispense Refill  . acetaminophen (TYLENOL) 500 MG tablet Take by mouth every 6 (six) hours as needed.     Marland Kitchen alendronate (FOSAMAX) 70 MG tablet Take 70 mg by mouth once a week. Take with a full glass of water on an empty stomach. Takes every Tuesday.    Marland Kitchen amitriptyline (ELAVIL) 50 MG tablet Take 1 tablet (50 mg total) by mouth at bedtime. 90 tablet 3  . aspirin 81 MG tablet Take 81 mg by mouth daily.      . beta carotene 10000 UNIT capsule Take by mouth.    . betamethasone valerate (VALISONE) 0.1 % cream Apply topically.    . cholecalciferol (VITAMIN D) 1000 UNITS tablet Take 2,000 Units by mouth daily.    . Desoximetasone (TOPICORT SPRAY EX) Apply 1 spray topically as needed.    . Ginkgo Biloba 40 MG TABS Take 120 mg by mouth daily.    Marland Kitchen levothyroxine (SYNTHROID, LEVOTHROID) 50 MCG tablet TAKE 1 TABLET BY MOUTH EVERY DAY 90 tablet 1  . losartan (COZAAR) 50 MG tablet Take 1 tablet (50 mg total) by mouth daily. 30 tablet 11  . Multiple Vitamin (THERA) TABS Take by mouth.    . Omega-3 Fatty Acids (FISH OIL) 1000 MG CAPS Take 1 capsule by mouth 2 (two) times daily.    . simvastatin (ZOCOR) 10 MG tablet Take 1 tablet by mouth daily.    Marland Kitchen torsemide (DEMADEX) 10 MG tablet Take 1 tablet (10 mg total) by mouth daily. 90 tablet 1  . triamcinolone cream (KENALOG) 0.1  %     . vitamin E 1000 UNIT capsule Take 1,000 Units by mouth daily.     No facility-administered medications prior to visit.     ROS Review of Systems  Constitutional: Negative.  Negative for diaphoresis, fatigue and unexpected weight change.  HENT: Negative.   Eyes: Negative for visual disturbance.  Respiratory: Negative for cough, chest tightness, shortness of breath and wheezing.   Cardiovascular: Positive for leg swelling. Negative for chest pain and palpitations.  Gastrointestinal: Negative for abdominal pain, anal bleeding, blood in stool, constipation, diarrhea, nausea and vomiting.  Genitourinary: Negative.  Negative for hematuria and vaginal bleeding.  Musculoskeletal: Negative for back pain, myalgias and neck pain.  Skin: Positive for rash.  Allergic/Immunologic: Negative.   Neurological: Negative.  Negative for numbness.  Hematological: Negative.  Negative for adenopathy. Does not bruise/bleed easily.  Psychiatric/Behavioral: Negative.     Objective:  BP 118/80 (BP Location: Left Arm, Patient Position: Sitting, Cuff Size: Normal)   Pulse 77   Temp 97.8 F (36.6 C) (Oral)   Resp 16   Ht 5\' 4"  (1.626 m)   Wt 122 lb 10 oz (55.6 kg)   SpO2 99%   BMI 21.05 kg/m   BP Readings from Last 3 Encounters:  06/05/17 118/80  05/09/17 110/62  03/20/17 128/82    Wt Readings from Last 3 Encounters:  06/05/17 122 lb 10 oz (55.6 kg)  05/09/17 124 lb (56.2 kg)  03/20/17 122 lb 0.6 oz (55.4 kg)    Physical Exam  Constitutional: She is oriented to person, place, and time. No distress.  HENT:  Mouth/Throat: Oropharynx is clear and moist. No oropharyngeal exudate.  Eyes: Conjunctivae are normal. Right eye exhibits no discharge. No scleral icterus.  Neck: Normal range of motion. Neck supple. No JVD present. No thyromegaly present.  Cardiovascular: Normal rate and regular rhythm.  Exam reveals no gallop.   No murmur heard. Pulmonary/Chest: Effort normal and breath sounds  normal. No respiratory distress. She has no wheezes. She has no rales. She exhibits no tenderness.  Abdominal: Soft. Bowel sounds are normal. She exhibits no distension and no mass. There is no tenderness. There is no rebound and no guarding.  Musculoskeletal: Normal range of motion. She exhibits edema. She exhibits no tenderness or deformity.  Lymphadenopathy:    She has no cervical adenopathy.  Neurological: She is alert and oriented to person, place, and time.  Skin: Skin is warm and dry. Rash noted. She is not diaphoretic. No erythema. No pallor.  She has large patches of scale, papules, hyperpigmentation over her lower extremities.  Vitals reviewed.   Lab Results  Component Value Date   WBC 8.7 06/05/2017   HGB 12.1 06/05/2017   HCT 36.3 06/05/2017   PLT 310.0 06/05/2017   GLUCOSE 92 03/05/2017   CHOL 190 07/30/2016   TRIG 119.0 07/30/2016   HDL 48.70 07/30/2016   LDLDIRECT 113 (H) 06/16/2008   LDLCALC 118 (H) 07/30/2016   ALT 20 11/07/2016   AST 25 11/07/2016   NA 138 03/05/2017   K 5.0 03/05/2017   CL 104 03/05/2017   CREATININE 1.07 03/05/2017   BUN 12 03/05/2017   CO2 27 03/05/2017   TSH 3.40 05/09/2017   INR 1.05 06/26/2016    No results found.  Assessment & Plan:   Hollye was seen today for anemia.  Diagnoses and all orders for this visit:  Deficiency anemia- her H&H are normal now, I will monitor her for vitamin deficiencies. -     CBC with Differential/Platelet; Future -     IBC panel; Future -     Ferritin; Future -     Folate; Future -     Vitamin B1; Future -     Zinc; Future  Pain and swelling of left lower leg- she has pain, swelling, and a mildly elevated d-dimer. I've ordered a stat ultrasound to screen for deep venous thromboses. -     D-dimer, quantitative (not at [facility]); Future -     VAS [place] LOWER EXTREMITY VENOUS (DVT); Future  D-dimer, elevated- as above -     VAS [place] LOWER EXTREMITY VENOUS (DVT); Future   I am having Ms. Asher  maintain her aspirin, cholecalciferol, vitamin E, Fish Oil, losartan, triamcinolone cream, amitriptyline, simvastatin, torsemide, acetaminophen, beta carotene, betamethasone valerate, THERA, Ginkgo Biloba, Desoximetasone (TOPICORT SPRAY EX), alendronate, and levothyroxine.  No orders of the defined types were placed in this encounter.    Follow-up: Return in about 3 months (around 09/05/2017).  [name], MD

## 2017-06-05 NOTE — Patient Instructions (Signed)
Anemia, Nonspecific Anemia is a condition in which the concentration of red blood cells or hemoglobin in the blood is below normal. Hemoglobin is a substance in red blood cells that carries oxygen to the tissues of the body. Anemia results in not enough oxygen reaching these tissues. What are the causes? Common causes of anemia include:  Excessive bleeding. Bleeding may be internal or external. This includes excessive bleeding from periods (in women) or from the intestine.  Poor nutrition.  Chronic kidney, thyroid, and liver disease.  Bone marrow disorders that decrease red blood cell production.  Cancer and treatments for cancer.  HIV, AIDS, and their treatments.  Spleen problems that increase red blood cell destruction.  Blood disorders.  Excess destruction of red blood cells due to infection, medicines, and autoimmune disorders. What are the signs or symptoms?  Minor weakness.  Dizziness.  Headache.  Palpitations.  Shortness of breath, especially with exercise.  Paleness.  Cold sensitivity.  Indigestion.  Nausea.  Difficulty sleeping.  Difficulty concentrating. Symptoms may occur suddenly or they may develop slowly. How is this diagnosed? Additional blood tests are often needed. These help your health care provider determine the best treatment. Your health care provider will check your stool for blood and look for other causes of blood loss. How is this treated? Treatment varies depending on the cause of the anemia. Treatment can include:  Supplements of iron, vitamin B12, or folic acid.  Hormone medicines.  A blood transfusion. This may be needed if blood loss is severe.  Hospitalization. This may be needed if there is significant continual blood loss.  Dietary changes.  Spleen removal. Follow these instructions at home: Keep all follow-up appointments. It often takes many weeks to correct anemia, and having your health care provider check on your  condition and your response to treatment is very important. Get help right away if:  You develop extreme weakness, shortness of breath, or chest pain.  You become dizzy or have trouble concentrating.  You develop heavy vaginal bleeding.  You develop a rash.  You have bloody or black, tarry stools.  You faint.  You vomit up blood.  You vomit repeatedly.  You have abdominal pain.  You have a fever or persistent symptoms for more than 2-3 days.  You have a fever and your symptoms suddenly get worse.  You are dehydrated. This information is not intended to replace advice given to you by your health care provider. Make sure you discuss any questions you have with your health care provider. Document Released: 01/10/2005 Document Revised: 05/16/2016 Document Reviewed: 05/29/2013 Elsevier Interactive Patient Education  2017 Elsevier Inc.  

## 2017-06-06 ENCOUNTER — Telehealth: Payer: Self-pay | Admitting: Internal Medicine

## 2017-06-06 DIAGNOSIS — R7989 Other specified abnormal findings of blood chemistry: Secondary | ICD-10-CM | POA: Insufficient documentation

## 2017-06-06 LAB — D-DIMER, QUANTITATIVE (NOT AT ARMC): D DIMER QUANT: 1.49 ug{FEU}/mL — AB (ref <0.50)

## 2017-06-06 NOTE — Telephone Encounter (Signed)
im sorry the most recent DPR I did not see his name but on the others I do

## 2017-06-06 NOTE — Telephone Encounter (Signed)
Michelle [name] called back for lab results, he is not on DPR.  Please call pt back.

## 2017-06-06 NOTE — Telephone Encounter (Signed)
Lelon Perla is on the [facility].

## 2017-06-07 ENCOUNTER — Ambulatory Visit (HOSPITAL_COMMUNITY): Payer: Medicare HMO

## 2017-06-07 LAB — ZINC: ZINC: 70 ug/dL (ref 60–130)

## 2017-06-09 LAB — VITAMIN B1: Vitamin B1 (Thiamine): 17 nmol/L (ref 8–30)

## 2017-06-10 ENCOUNTER — Ambulatory Visit (HOSPITAL_COMMUNITY): Payer: Medicare HMO

## 2017-06-12 ENCOUNTER — Ambulatory Visit (HOSPITAL_COMMUNITY): Payer: Medicare HMO

## 2017-06-14 ENCOUNTER — Ambulatory Visit (HOSPITAL_COMMUNITY): Payer: Medicare HMO

## 2017-06-17 ENCOUNTER — Ambulatory Visit (HOSPITAL_COMMUNITY): Payer: Medicare HMO

## 2017-06-21 ENCOUNTER — Ambulatory Visit (HOSPITAL_COMMUNITY): Payer: Medicare HMO

## 2017-06-24 ENCOUNTER — Ambulatory Visit (HOSPITAL_COMMUNITY): Payer: Medicare HMO

## 2017-06-24 ENCOUNTER — Other Ambulatory Visit: Payer: Medicare HMO

## 2017-06-26 ENCOUNTER — Ambulatory Visit (HOSPITAL_COMMUNITY): Payer: Medicare HMO

## 2017-06-27 ENCOUNTER — Ambulatory Visit (INDEPENDENT_AMBULATORY_CARE_PROVIDER_SITE_OTHER): Payer: Medicare HMO | Admitting: Psychology

## 2017-06-27 ENCOUNTER — Encounter: Payer: Self-pay | Admitting: Psychology

## 2017-06-27 DIAGNOSIS — S060X0S Concussion without loss of consciousness, sequela: Secondary | ICD-10-CM

## 2017-06-27 DIAGNOSIS — R413 Other amnesia: Secondary | ICD-10-CM
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
## 2017-06-27 NOTE — Progress Notes (Signed)
   Neuropsychology Note  Michelle [name] came in today for 3 hours of neuropsychological testing with technician, Milana Kidney, BS, under the supervision of Dr. Macarthur Critchley. The patient did not appear overtly distressed by the testing session, per behavioral observation or via self-report to the technician. Rest breaks were offered. Michelle [name] will return within 2 weeks for a feedback session with Dr. Si Raider at which time her test performances, clinical impressions and treatment recommendations will be reviewed in detail. The patient understands she can contact our office should she require our assistance before this time.  Full report to follow.

## 2017-06-28 ENCOUNTER — Ambulatory Visit (HOSPITAL_COMMUNITY): Payer: Medicare HMO

## 2017-07-01 ENCOUNTER — Ambulatory Visit (HOSPITAL_COMMUNITY): Payer: Medicare HMO

## 2017-07-01 ENCOUNTER — Encounter: Payer: Self-pay | Admitting: Internal Medicine

## 2017-07-01 ENCOUNTER — Other Ambulatory Visit (INDEPENDENT_AMBULATORY_CARE_PROVIDER_SITE_OTHER): Payer: Medicare HMO

## 2017-07-01 ENCOUNTER — Ambulatory Visit (INDEPENDENT_AMBULATORY_CARE_PROVIDER_SITE_OTHER): Payer: Medicare HMO | Admitting: Internal Medicine

## 2017-07-01 VITALS — BP 138/98 | HR 95 | Temp 97.9°F | Resp 16 | Ht 64.0 in | Wt 129.2 lb

## 2017-07-01 DIAGNOSIS — R600 Localized edema: Secondary | ICD-10-CM

## 2017-07-01 DIAGNOSIS — E039 Hypothyroidism, unspecified: Secondary | ICD-10-CM

## 2017-07-01 DIAGNOSIS — R7989 Other specified abnormal findings of blood chemistry: Secondary | ICD-10-CM | POA: Diagnosis not present

## 2017-07-01 DIAGNOSIS — I10 Essential (primary) hypertension: Secondary | ICD-10-CM

## 2017-07-01 DIAGNOSIS — Q828 Other specified congenital malformations of skin: Secondary | ICD-10-CM | POA: Diagnosis not present

## 2017-07-01 LAB — BASIC METABOLIC PANEL
BUN: 24 mg/dL — ABNORMAL HIGH (ref 6–23)
CHLORIDE: 100 meq/L (ref 96–112)
CO2: 28 meq/L (ref 19–32)
Calcium: 9.2 mg/dL (ref 8.4–10.5)
Creatinine, Ser: 1.28 mg/dL — ABNORMAL HIGH (ref 0.40–1.20)
GFR: 44.16 mL/min — ABNORMAL LOW (ref >60.00)
Glucose, Bld: 94 mg/dL (ref 70–99)
POTASSIUM: 3.8 meq/L (ref 3.5–5.1)
SODIUM: 138 meq/L (ref 135–145)

## 2017-07-01 MED ORDER — TELMISARTAN 80 MG PO TABS: 80.0000 mg | ORAL_TABLET | Freq: Every day | ORAL | 1 refills | Status: DC

## 2017-07-01 MED ORDER — TORSEMIDE 20 MG PO TABS: 20.0000 mg | ORAL_TABLET | Freq: Every day | ORAL | 1 refills | Status: DC

## 2017-07-01 NOTE — Progress Notes (Signed)
Subjective:  Patient ID: Michelle [name], female    DOB: [birth year]  Age: 66 y.o. MRN: [id number]  CC: Hypertension and Hypothyroidism   HPI ARRYN TERRONES presents for f/up - she complains of worsening edema in BLE's. When I last saw her she had a mildly elevated d-dimer and I tried to get an ultrasound done of her lower extremities to screen for DVT. She is having difficulty with her phone and has not been able to answer phone calls. She tells me today that she has fixed her technical difficulty and can now receive phone calls. She thinks she is taking a loop diuretic to help with the symptoms but she's not sure.  Outpatient Medications Prior to Visit  Medication Sig Dispense Refill  . acetaminophen (TYLENOL) 500 MG tablet Take by mouth every 6 (six) hours as needed.     Marland Kitchen alendronate (FOSAMAX) 70 MG tablet Take 70 mg by mouth once a week. Take with a full glass of water on an empty stomach. Takes every Tuesday.    Marland Kitchen amitriptyline (ELAVIL) 50 MG tablet Take 1 tablet (50 mg total) by mouth at bedtime. 90 tablet 3  . aspirin 81 MG tablet Take 81 mg by mouth daily.      . beta carotene 10000 UNIT capsule Take by mouth.    . betamethasone valerate (VALISONE) 0.1 % cream Apply topically.    . cholecalciferol (VITAMIN D) 1000 UNITS tablet Take 2,000 Units by mouth daily.    . Desoximetasone (TOPICORT SPRAY EX) Apply 1 spray topically as needed.    . Ginkgo Biloba 40 MG TABS Take 120 mg by mouth daily.    Marland Kitchen levothyroxine (SYNTHROID, LEVOTHROID) 50 MCG tablet TAKE 1 TABLET BY MOUTH EVERY DAY 90 tablet 1  . Omega-3 Fatty Acids (FISH OIL) 1000 MG CAPS Take 1 capsule by mouth 2 (two) times daily.    . simvastatin (ZOCOR) 10 MG tablet Take 1 tablet by mouth daily.    Marland Kitchen triamcinolone cream (KENALOG) 0.1 %     . vitamin E 1000 UNIT capsule Take 1,000 Units by mouth daily.    Marland Kitchen losartan (COZAAR) 50 MG tablet Take 1 tablet (50 mg total) by mouth daily. 30 tablet 11  . Multiple Vitamin (THERA) TABS  Take by mouth.    . torsemide (DEMADEX) 10 MG tablet Take 1 tablet (10 mg total) by mouth daily. 90 tablet 1   No facility-administered medications prior to visit.     ROS Review of Systems  Constitutional: Negative for appetite change, diaphoresis, fatigue, fever and unexpected weight change.  HENT: Negative.   Eyes: Negative for visual disturbance.  Respiratory: Negative for cough, chest tightness, shortness of breath and wheezing.   Cardiovascular: Positive for leg swelling. Negative for chest pain and palpitations.  Gastrointestinal: Negative for abdominal pain, constipation, diarrhea, nausea and vomiting.  Genitourinary: Negative.  Negative for decreased urine volume, difficulty urinating, dysuria, frequency and urgency.  Musculoskeletal: Negative.  Negative for back pain and myalgias.  Skin: Positive for rash. Negative for color change, pallor and wound.       She complains of chronic, unchanged, itchy papules on her lower extremities.  Neurological: Negative.  Negative for dizziness, weakness and headaches.  Hematological: Negative.  Negative for adenopathy. Does not bruise/bleed easily.  Psychiatric/Behavioral: Negative.     Objective:  BP (!) 138/98 (BP Location: Left Arm, Patient Position: Sitting, Cuff Size: Normal)   Pulse 95   Temp 97.9 F (36.6 C) (Oral)  Resp 16   Ht 5\' 4"  (1.626 m)   Wt 129 lb 4 oz (58.6 kg)   SpO2 96%   BMI 22.19 kg/m   BP Readings from Last 3 Encounters:  07/01/17 (!) 138/98  06/05/17 118/80  05/09/17 110/62    Wt Readings from Last 3 Encounters:  07/01/17 129 lb 4 oz (58.6 kg)  06/05/17 122 lb 10 oz (55.6 kg)  05/09/17 124 lb (56.2 kg)    Physical Exam  Constitutional: She is oriented to person, place, and time. No distress.  HENT:  Mouth/Throat: Oropharynx is clear and moist. No oropharyngeal exudate.  Eyes: Conjunctivae are normal. Right eye exhibits no discharge. Left eye exhibits no discharge. No scleral icterus.  Neck:  Normal range of motion. Neck supple. No JVD present. No thyromegaly present.  Cardiovascular: Normal rate and regular rhythm.  Exam reveals no gallop and no friction rub.   No murmur heard. Pulmonary/Chest: Effort normal and breath sounds normal. No respiratory distress. She has no wheezes. She has no rales. She exhibits no tenderness.  Abdominal: Soft. Bowel sounds are normal. She exhibits no distension and no mass. There is no tenderness. There is no rebound and no guarding.  Musculoskeletal: Normal range of motion. She exhibits edema. She exhibits no tenderness or deformity.  Lymphadenopathy:    She has no cervical adenopathy.  Neurological: She is alert and oriented to person, place, and time.  Skin: Skin is warm and dry. Rash noted. She is not diaphoretic. No erythema. No pallor.  Scaly, crusty papules over BLE  Vitals reviewed.   Lab Results  Component Value Date   WBC 8.7 06/05/2017   HGB 12.1 06/05/2017   HCT 36.3 06/05/2017   PLT 310.0 06/05/2017   GLUCOSE 94 07/01/2017   CHOL 190 07/30/2016   TRIG 119.0 07/30/2016   HDL 48.70 07/30/2016   LDLDIRECT 113 (H) 06/16/2008   LDLCALC 118 (H) 07/30/2016   ALT 20 11/07/2016   AST 25 11/07/2016   NA 138 07/01/2017   K 3.8 07/01/2017   CL 100 07/01/2017   CREATININE 1.28 (H) 07/01/2017   BUN 24 (H) 07/01/2017   CO2 28 07/01/2017   TSH 1.20 07/01/2017   INR 1.05 06/26/2016    No results found.  Assessment & Plan:   Kaya was seen today for hypertension and hypothyroidism.  Diagnoses and all orders for this visit:  Localized edema- I will increase the dose of the loop diuretic, her d-dimer remains mildly elevated so again I've asked her to undergo an ultrasound to screen for DVT. -     D-dimer, quantitative (not at [facility]); Future -     torsemide (DEMADEX) 20 MG tablet; Take 1 tablet (20 mg total) by mouth daily. -     VAS [place] LOWER EXTREMITY VENOUS (DVT); Future  D-dimer, elevated- as above -     D-dimer, quantitative  (not at [facility]); Future -     VAS [place] LOWER EXTREMITY VENOUS (DVT); Future  Acquired hypothyroidism- her TSH is in the normal range, she will remain on current dose of levothyroxine. -     TSH; Future  Essential hypertension- her blood pressure is not adequately well controlled, I will increase the dose of the loop diuretic and change her to a more potent ARB. -     torsemide (DEMADEX) 20 MG tablet; Take 1 tablet (20 mg total) by mouth daily. -     Basic metabolic panel; Future -     telmisartan (MICARDIS) 80 MG  tablet; Take 1 tablet (80 mg total) by mouth daily.  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   I have discontinued Ms. Kina's losartan, torsemide, and THERA. I am also having her start on torsemide and telmisartan. Additionally, I am having her maintain her aspirin, cholecalciferol, vitamin E, Fish Oil, triamcinolone cream, amitriptyline, simvastatin, acetaminophen, beta carotene, betamethasone valerate, Ginkgo Biloba, Desoximetasone (TOPICORT SPRAY EX), alendronate, and levothyroxine.  Meds ordered this encounter  Medications  . torsemide (DEMADEX) 20 MG tablet    Sig: Take 1 tablet (20 mg total) by mouth daily.    Dispense:  90 tablet    Refill:  1  . telmisartan (MICARDIS) 80 MG tablet    Sig: Take 1 tablet (80 mg total) by mouth daily.    Dispense:  90 tablet    Refill:  1     Follow-up: Return in about 4 weeks (around 07/29/2017).  [name], MD

## 2017-07-01 NOTE — Patient Instructions (Signed)
Edema Edema is an abnormal buildup of fluids in your bodytissues. Edema is somewhatdependent on gravity to pull the fluid to the lowest place in your body. That makes the condition more common in the legs and thighs (lower extremities). Painless swelling of the feet and ankles is common and becomes more likely as you get older. It is also common in looser tissues, like around your eyes. When the affected area is squeezed, the fluid may move out of that spot and leave a dent for a few moments. This dent is called pitting. What are the causes? There are many possible causes of edema. Eating too much salt and being on your feet or sitting for a long time can cause edema in your legs and ankles. Hot weather may make edema worse. Common medical causes of edema include:  Heart failure.  Liver disease.  Kidney disease.  Weak blood vessels in your legs.  Cancer.  An injury.  Pregnancy.  Some medications.  Obesity.  What are the signs or symptoms? Edema is usually painless.Your skin may look swollen or shiny. How is this diagnosed? Your health care provider may be able to diagnose edema by asking about your medical history and doing a physical exam. You may need to have tests such as X-rays, an electrocardiogram, or blood tests to check for medical conditions that may cause edema. How is this treated? Edema treatment depends on the cause. If you have heart, liver, or kidney disease, you need the treatment appropriate for these conditions. General treatment may include:  Elevation of the affected body part above the level of your heart.  Compression of the affected body part. Pressure from elastic bandages or support stockings squeezes the tissues and forces fluid back into the blood vessels. This keeps fluid from entering the tissues.  Restriction of fluid and salt intake.  Use of a water pill (diuretic). These medications are appropriate only for some types of edema. They pull fluid  out of your body and make you urinate more often. This gets rid of fluid and reduces swelling, but diuretics can have side effects. Only use diuretics as directed by your health care provider.  Follow these instructions at home:  Keep the affected body part above the level of your heart when you are lying down.  Do not sit still or stand for prolonged periods.  Do not put anything directly under your knees when lying down.  Do not wear constricting clothing or garters on your upper legs.  Exercise your legs to work the fluid back into your blood vessels. This may help the swelling go down.  Wear elastic bandages or support stockings to reduce ankle swelling as directed by your health care provider.  Eat a low-salt diet to reduce fluid if your health care provider recommends it.  Only take medicines as directed by your health care provider. Contact a health care provider if:  Your edema is not responding to treatment.  You have heart, liver, or kidney disease and notice symptoms of edema.  You have edema in your legs that does not improve after elevating them.  You have sudden and unexplained weight gain. Get help right away if:  You develop shortness of breath or chest pain.  You cannot breathe when you lie down.  You develop pain, redness, or warmth in the swollen areas.  You have heart, liver, or kidney disease and suddenly get edema.  You have a fever and your symptoms suddenly get worse. This information is   not intended to replace advice given to you by your health care provider. Make sure you discuss any questions you have with your health care provider. Document Released: 12/03/2005 Document Revised: 05/10/2016 Document Reviewed: 09/25/2013 Elsevier Interactive Patient Education  2017 Elsevier Inc.  

## 2017-07-02 LAB — D-DIMER, QUANTITATIVE (NOT AT ARMC): D DIMER QUANT: 1.98 ug{FEU}/mL — AB (ref <0.50)

## 2017-07-02 LAB — TSH: TSH: 1.2 u[IU]/mL (ref 0.35–4.50)

## 2017-07-03 ENCOUNTER — Ambulatory Visit (HOSPITAL_COMMUNITY): Payer: Medicare HMO

## 2017-07-05 ENCOUNTER — Ambulatory Visit (HOSPITAL_COMMUNITY): Payer: Medicare HMO

## 2017-07-05 ENCOUNTER — Ambulatory Visit (HOSPITAL_COMMUNITY)
Admission: RE | Admit: 2017-07-05 | Discharge: 2017-07-05 | Disposition: A | Discharge status: Home / Self Care | Payer: Medicare HMO | Source: Ambulatory Visit | Attending: Internal Medicine | Admitting: Internal Medicine

## 2017-07-05 ENCOUNTER — Encounter (HOSPITAL_COMMUNITY): Payer: Medicare HMO

## 2017-07-05 ENCOUNTER — Telehealth: Payer: Self-pay

## 2017-07-05 DIAGNOSIS — R600 Localized edema: Secondary | ICD-10-CM

## 2017-07-05 DIAGNOSIS — R7989 Other specified abnormal findings of blood chemistry: Secondary | ICD-10-CM

## 2017-07-05 NOTE — Telephone Encounter (Signed)
Patient is on the list for [employer] [facility] and may be a good candidate for an AWV. Please let me know if/when appt is scheduled.   

## 2017-07-08 ENCOUNTER — Ambulatory Visit (HOSPITAL_COMMUNITY): Payer: Medicare HMO

## 2017-07-09 DIAGNOSIS — Q828 Other specified congenital malformations of skin: Secondary | ICD-10-CM | POA: Diagnosis not present

## 2017-07-10 ENCOUNTER — Ambulatory Visit (HOSPITAL_COMMUNITY): Payer: Medicare HMO

## 2017-07-11 NOTE — Telephone Encounter (Signed)
Called pt to schedule AWV. Pt does not have vm set up to leave a msg.

## 2017-07-12 ENCOUNTER — Ambulatory Visit (HOSPITAL_COMMUNITY): Payer: Medicare HMO

## 2017-07-15 ENCOUNTER — Ambulatory Visit (HOSPITAL_COMMUNITY): Payer: Medicare HMO

## 2017-07-17 ENCOUNTER — Ambulatory Visit (HOSPITAL_COMMUNITY): Payer: Medicare HMO

## 2017-07-19 ENCOUNTER — Ambulatory Visit (HOSPITAL_COMMUNITY): Payer: Medicare HMO

## 2017-07-22 ENCOUNTER — Ambulatory Visit (HOSPITAL_COMMUNITY): Payer: Medicare HMO

## 2017-07-24 ENCOUNTER — Ambulatory Visit (HOSPITAL_COMMUNITY): Payer: Medicare HMO

## 2017-07-26 ENCOUNTER — Ambulatory Visit (HOSPITAL_COMMUNITY): Payer: Medicare HMO

## 2017-07-29 ENCOUNTER — Ambulatory Visit (HOSPITAL_COMMUNITY): Payer: Medicare HMO
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
## 2017-07-31 ENCOUNTER — Ambulatory Visit (HOSPITAL_COMMUNITY): Payer: Medicare HMO

## 2017-08-01 ENCOUNTER — Encounter: Payer: Self-pay | Admitting: Psychology

## 2017-08-01 ENCOUNTER — Ambulatory Visit (INDEPENDENT_AMBULATORY_CARE_PROVIDER_SITE_OTHER): Payer: Medicare HMO | Admitting: Psychology

## 2017-08-01 DIAGNOSIS — R413 Other amnesia: Secondary | ICD-10-CM

## 2017-08-01 DIAGNOSIS — S060X0S Concussion without loss of consciousness, sequela: Secondary | ICD-10-CM | POA: Diagnosis not present

## 2017-08-01 NOTE — Patient Instructions (Signed)
Please have someone help you get your voicemail set up and regularly checked on your phone, so that you do not miss scheduling calls. I will have Dr. Delice Lesch re-order your brain MRI. This is really important for you to schedule and complete.   We also talked about how antidepressant medication may help you. You can follow up with Dr. Marcello Moores about this.  We will do re-testing in a year of your cognitive functioning.  I do not think your memory problems are due to your concussion.

## 2017-08-02 ENCOUNTER — Telehealth: Payer: Self-pay

## 2017-08-02 ENCOUNTER — Ambulatory Visit (HOSPITAL_COMMUNITY): Payer: Medicare HMO

## 2017-08-02 ENCOUNTER — Other Ambulatory Visit: Payer: Self-pay

## 2017-08-02 DIAGNOSIS — R519 Headache, unspecified: Secondary | ICD-10-CM

## 2017-08-02 DIAGNOSIS — R51 Headache: Principal | ICD-10-CM

## 2017-08-02 NOTE — Telephone Encounter (Signed)
-----   Message from Cameron Sprang, MD sent at 08/02/2017  8:41 AM EDT ----- Pls see Dr. Marcia Brash message below and reschedule MRI brain for the patient, thanks!  ----- Message ----- From: Kandis Nab, PsyD Sent: 08/01/2017   3:19 PM To: Cameron Sprang, MD  Dr. Delice Lesch, can you please re-order her brain MRI? It was cancelled because she could not be reached to schedule it, but I spoke with her about having someone help her with her phone so it can be scheduled.

## 2017-08-02 NOTE — Telephone Encounter (Signed)
Orders placed for MRI 

## 2017-08-05 ENCOUNTER — Ambulatory Visit (HOSPITAL_COMMUNITY): Payer: Medicare HMO

## 2017-08-07 ENCOUNTER — Emergency Department (HOSPITAL_COMMUNITY): Payer: Medicare HMO

## 2017-08-07 ENCOUNTER — Encounter: Payer: Self-pay | Admitting: Internal Medicine

## 2017-08-07 ENCOUNTER — Ambulatory Visit (INDEPENDENT_AMBULATORY_CARE_PROVIDER_SITE_OTHER): Payer: Medicare HMO | Admitting: Internal Medicine

## 2017-08-07 ENCOUNTER — Ambulatory Visit (HOSPITAL_COMMUNITY): Payer: Medicare HMO

## 2017-08-07 ENCOUNTER — Encounter (HOSPITAL_COMMUNITY): Payer: Self-pay | Admitting: Emergency Medicine

## 2017-08-07 ENCOUNTER — Inpatient Hospital Stay (HOSPITAL_COMMUNITY)
Admission: EM | Admit: 2017-08-07 | Discharge: 2017-08-10 | DRG: 872 | Disposition: A | Discharge status: Home / Self Care | Payer: Medicare HMO | Attending: Internal Medicine | Admitting: Internal Medicine

## 2017-08-07 VITALS — BP 70/50 | HR 35 | Temp 97.6°F | Resp 20 | Ht 64.0 in | Wt 123.8 lb

## 2017-08-07 DIAGNOSIS — Z880 Allergy status to penicillin: Secondary | ICD-10-CM

## 2017-08-07 DIAGNOSIS — E860 Dehydration: Secondary | ICD-10-CM | POA: Insufficient documentation

## 2017-08-07 DIAGNOSIS — Z952 Presence of prosthetic heart valve: Secondary | ICD-10-CM

## 2017-08-07 DIAGNOSIS — R52 Pain, unspecified: Secondary | ICD-10-CM

## 2017-08-07 DIAGNOSIS — D649 Anemia, unspecified: Secondary | ICD-10-CM | POA: Diagnosis not present

## 2017-08-07 DIAGNOSIS — N183 Chronic kidney disease, stage 3 (moderate): Secondary | ICD-10-CM | POA: Diagnosis present

## 2017-08-07 DIAGNOSIS — E785 Hyperlipidemia, unspecified: Secondary | ICD-10-CM | POA: Diagnosis not present

## 2017-08-07 DIAGNOSIS — M810 Age-related osteoporosis without current pathological fracture: Secondary | ICD-10-CM | POA: Diagnosis not present

## 2017-08-07 DIAGNOSIS — A084 Viral intestinal infection, unspecified: Secondary | ICD-10-CM | POA: Diagnosis present

## 2017-08-07 DIAGNOSIS — I10 Essential (primary) hypertension: Secondary | ICD-10-CM | POA: Diagnosis not present

## 2017-08-07 DIAGNOSIS — K5730 Diverticulosis of large intestine without perforation or abscess without bleeding: Secondary | ICD-10-CM | POA: Diagnosis not present

## 2017-08-07 DIAGNOSIS — N179 Acute kidney failure, unspecified: Secondary | ICD-10-CM | POA: Diagnosis not present

## 2017-08-07 DIAGNOSIS — R42 Dizziness and giddiness: Secondary | ICD-10-CM | POA: Diagnosis not present

## 2017-08-07 DIAGNOSIS — Z7982 Long term (current) use of aspirin: Secondary | ICD-10-CM

## 2017-08-07 DIAGNOSIS — Z79899 Other long term (current) drug therapy: Secondary | ICD-10-CM

## 2017-08-07 DIAGNOSIS — K529 Noninfective gastroenteritis and colitis, unspecified: Secondary | ICD-10-CM | POA: Diagnosis not present

## 2017-08-07 DIAGNOSIS — R109 Unspecified abdominal pain: Secondary | ICD-10-CM | POA: Diagnosis not present

## 2017-08-07 DIAGNOSIS — A419 Sepsis, unspecified organism: Principal | ICD-10-CM | POA: Diagnosis present

## 2017-08-07 DIAGNOSIS — R404 Transient alteration of awareness: Secondary | ICD-10-CM | POA: Diagnosis not present

## 2017-08-07 DIAGNOSIS — F419 Anxiety disorder, unspecified: Secondary | ICD-10-CM | POA: Diagnosis present

## 2017-08-07 DIAGNOSIS — R079 Chest pain, unspecified: Secondary | ICD-10-CM | POA: Diagnosis not present

## 2017-08-07 DIAGNOSIS — Z87891 Personal history of nicotine dependence: Secondary | ICD-10-CM

## 2017-08-07 DIAGNOSIS — R69 Illness, unspecified: Secondary | ICD-10-CM | POA: Diagnosis not present

## 2017-08-07 LAB — COMPREHENSIVE METABOLIC PANEL
ALBUMIN: 3.3 g/dL — AB (ref 3.5–5.0)
ALK PHOS: 54 U/L (ref 38–126)
ALT: 13 U/L — AB (ref 14–54)
AST: 18 U/L (ref 15–41)
Anion gap: 10 (ref 5–15)
BILIRUBIN TOTAL: 0.6 mg/dL (ref 0.3–1.2)
BUN: 90 mg/dL — AB (ref 6–20)
CALCIUM: 7.8 mg/dL — AB (ref 8.9–10.3)
CO2: 26 mmol/L (ref 22–32)
CREATININE: 2.62 mg/dL — AB (ref 0.44–1.00)
Chloride: 95 mmol/L — ABNORMAL LOW (ref 101–111)
GFR calc Af Amer: 21 mL/min — ABNORMAL LOW (ref >60)
GFR calc non Af Amer: 18 mL/min — ABNORMAL LOW (ref >60)
GLUCOSE: 100 mg/dL — AB (ref 65–99)
Potassium: 3.6 mmol/L (ref 3.5–5.1)
SODIUM: 131 mmol/L — AB (ref 135–145)
Total Protein: 6.4 g/dL — ABNORMAL LOW (ref 6.5–8.1)

## 2017-08-07 LAB — URINALYSIS, ROUTINE W REFLEX MICROSCOPIC
Bilirubin Urine: NEGATIVE
GLUCOSE, UA: NEGATIVE mg/dL
Hgb urine dipstick: NEGATIVE
KETONES UR: NEGATIVE mg/dL
LEUKOCYTES UA: NEGATIVE
Nitrite: NEGATIVE
PH: 7 (ref 5.0–8.0)
Protein, ur: NEGATIVE mg/dL
Specific Gravity, Urine: 1.005 (ref 1.005–1.030)

## 2017-08-07 LAB — CBC WITH DIFFERENTIAL/PLATELET
Basophils Absolute: 0 10*3/uL (ref 0.0–0.1)
Basophils Relative: 0 %
EOS ABS: 0.2 10*3/uL (ref 0.0–0.7)
EOS PCT: 1 %
HCT: 29.9 % — ABNORMAL LOW (ref 36.0–46.0)
HEMOGLOBIN: 10.1 g/dL — AB (ref 12.0–15.0)
LYMPHS ABS: 0.9 10*3/uL (ref 0.7–4.0)
Lymphocytes Relative: 6 %
MCH: 28.9 pg (ref 26.0–34.0)
MCHC: 33.8 g/dL (ref 30.0–36.0)
MCV: 85.7 fL (ref 78.0–100.0)
MONO ABS: 0.6 10*3/uL (ref 0.1–1.0)
MONOS PCT: 4 %
Neutro Abs: 13.6 10*3/uL — ABNORMAL HIGH (ref 1.7–7.7)
Neutrophils Relative %: 89 %
PLATELETS: 290 10*3/uL (ref 150–400)
RBC: 3.49 MIL/uL — ABNORMAL LOW (ref 3.87–5.11)
RDW: 14.1 % (ref 11.5–15.5)
WBC: 15.3 10*3/uL — ABNORMAL HIGH (ref 4.0–10.5)

## 2017-08-07 LAB — I-STAT CG4 LACTIC ACID, ED: Lactic Acid, Venous: 0.96 mmol/L (ref 0.5–1.9)

## 2017-08-07 LAB — POC OCCULT BLOOD, ED: FECAL OCCULT BLD: NEGATIVE

## 2017-08-07 LAB — PROTIME-INR
INR: 1.1
PROTHROMBIN TIME: 14.2 s (ref 11.4–15.2)

## 2017-08-07 LAB — I-STAT TROPONIN, ED: Troponin i, poc: 0.03 ng/mL (ref 0.00–0.08)

## 2017-08-07 LAB — CBG MONITORING, ED: Glucose-Capillary: 79 mg/dL (ref 65–99)

## 2017-08-07 MED ORDER — DEXTROSE-NACL 5-0.45 % IV SOLN
INTRAVENOUS | Status: DC
Start: 2017-08-07 — End: 2017-08-10
  Administered 2017-08-08 – 2017-08-10 (×5): via INTRAVENOUS

## 2017-08-07 MED ORDER — FENTANYL CITRATE (PF) 100 MCG/2ML IJ SOLN
50.0000 ug | Freq: Once | INTRAMUSCULAR | Status: AC
  Administered 2017-08-07: 50 ug via INTRAVENOUS
  Filled 2017-08-07: qty 2

## 2017-08-07 MED ORDER — AMITRIPTYLINE HCL 25 MG PO TABS
50.0000 mg | ORAL_TABLET | Freq: Every day | ORAL | Status: DC
  Administered 2017-08-08 – 2017-08-09 (×3): 50 mg via ORAL
  Filled 2017-08-07 (×3): qty 2

## 2017-08-07 MED ORDER — ONDANSETRON HCL 4 MG/2ML IJ SOLN: 4.0000 mg | Freq: Four times a day (QID) | INTRAMUSCULAR | Status: DC | PRN

## 2017-08-07 MED ORDER — SIMVASTATIN 10 MG PO TABS
10.0000 mg | ORAL_TABLET | Freq: Every day | ORAL | Status: DC
  Administered 2017-08-08 – 2017-08-09 (×2): 10 mg via ORAL
  Filled 2017-08-07 (×2): qty 1

## 2017-08-07 MED ORDER — LEVOTHYROXINE SODIUM 50 MCG PO TABS
50.0000 ug | ORAL_TABLET | Freq: Every day | ORAL | Status: DC
  Administered 2017-08-08 – 2017-08-10 (×3): 50 ug via ORAL
  Filled 2017-08-07 (×3): qty 1

## 2017-08-07 MED ORDER — SODIUM CHLORIDE 0.9 % IV SOLN
500.0000 mg | Freq: Every day | INTRAVENOUS | Status: DC
  Administered 2017-08-08: 500 mg via INTRAVENOUS
  Filled 2017-08-07: qty 500

## 2017-08-07 MED ORDER — DEXTROSE 5 % IV SOLN
2.0000 g | Freq: Three times a day (TID) | INTRAVENOUS | Status: DC
  Filled 2017-08-07: qty 2

## 2017-08-07 MED ORDER — IOPAMIDOL (ISOVUE-300) INJECTION 61%
INTRAVENOUS | Status: AC
  Filled 2017-08-07: qty 30

## 2017-08-07 MED ORDER — ASPIRIN 81 MG PO CHEW
81.0000 mg | CHEWABLE_TABLET | Freq: Every day | ORAL | Status: DC
  Administered 2017-08-08 – 2017-08-10 (×3): 81 mg via ORAL
  Filled 2017-08-07 (×3): qty 1

## 2017-08-07 MED ORDER — IOPAMIDOL (ISOVUE-300) INJECTION 61%
30.0000 mL | Freq: Once | INTRAVENOUS | Status: AC | PRN
  Administered 2017-08-07: 30 mL via ORAL

## 2017-08-07 MED ORDER — CIPROFLOXACIN IN D5W 400 MG/200ML IV SOLN
400.0000 mg | Freq: Once | INTRAVENOUS | Status: AC
  Administered 2017-08-07: 400 mg via INTRAVENOUS
  Filled 2017-08-07: qty 200

## 2017-08-07 MED ORDER — PANTOPRAZOLE SODIUM 40 MG IV SOLR
40.0000 mg | Freq: Every day | INTRAVENOUS | Status: DC
  Administered 2017-08-08 (×2): 40 mg via INTRAVENOUS
  Filled 2017-08-07 (×2): qty 40

## 2017-08-07 MED ORDER — MORPHINE SULFATE (PF) 2 MG/ML IV SOLN: 2.0000 mg | INTRAVENOUS | Status: DC | PRN

## 2017-08-07 MED ORDER — METRONIDAZOLE IN NACL 5-0.79 MG/ML-% IV SOLN
500.0000 mg | Freq: Once | INTRAVENOUS | Status: AC
  Administered 2017-08-07: 500 mg via INTRAVENOUS
  Filled 2017-08-07: qty 100

## 2017-08-07 MED ORDER — LACTATED RINGERS IV BOLUS (SEPSIS)
1000.0000 mL | Freq: Once | INTRAVENOUS | Status: AC
  Administered 2017-08-07: 1000 mL via INTRAVENOUS

## 2017-08-07 MED ORDER — HEPARIN SODIUM (PORCINE) 5000 UNIT/ML IJ SOLN
5000.0000 [IU] | Freq: Three times a day (TID) | INTRAMUSCULAR | Status: DC
  Administered 2017-08-08 – 2017-08-10 (×8): 5000 [IU] via SUBCUTANEOUS
  Filled 2017-08-07 (×8): qty 1

## 2017-08-07 MED ORDER — SODIUM CHLORIDE 0.9 % IV BOLUS (SEPSIS)
1000.0000 mL | Freq: Once | INTRAVENOUS | Status: AC
  Administered 2017-08-07: 1000 mL via INTRAVENOUS

## 2017-08-07 MED ORDER — ACETAMINOPHEN 500 MG PO TABS
500.0000 mg | ORAL_TABLET | Freq: Four times a day (QID) | ORAL | Status: DC | PRN
  Administered 2017-08-08 – 2017-08-09 (×4): 500 mg via ORAL
  Filled 2017-08-07 (×5): qty 1
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
## 2017-08-07 NOTE — ED Notes (Signed)
Please call report to Davy Pique, RN at 2300 at [phone]. Thanks!

## 2017-08-07 NOTE — Patient Instructions (Signed)
Dehydration, Adult Dehydration is when there is not enough fluid or water in your body. This happens when you lose more fluids than you take in. Dehydration can range from mild to very bad. It should be treated right away to keep it from getting very bad. Symptoms of mild dehydration may include:  Thirst.  Dry lips.  Slightly dry mouth.  Dry, warm skin.  Dizziness. Symptoms of moderate dehydration may include:  Very dry mouth.  Muscle cramps.  Dark pee (urine). Pee may be the color of tea.  Your body making less pee.  Your eyes making fewer tears.  Heartbeat that is uneven or faster than normal (palpitations).  Headache.  Light-headedness, especially when you stand up from sitting.  Fainting (syncope). Symptoms of very bad dehydration may include:  Changes in skin, such as: ? Cold and clammy skin. ? Blotchy (mottled) or pale skin. ? Skin that does not quickly return to normal after being lightly pinched and let go (poor skin turgor).  Changes in body fluids, such as: ? Feeling very thirsty. ? Your eyes making fewer tears. ? Not sweating when body temperature is high, such as in hot weather. ? Your body making very little pee.  Changes in vital signs, such as: ? Weak pulse. ? Pulse that is more than 100 beats a minute when you are sitting still. ? Fast breathing. ? Low blood pressure.  Other changes, such as: ? Sunken eyes. ? Cold hands and feet. ? Confusion. ? Lack of energy (lethargy). ? Trouble waking up from sleep. ? Short-term weight loss. ? Unconsciousness. Follow these instructions at home:  If told by your doctor, drink an ORS: ? Make an ORS by using instructions on the package. ? Start by drinking small amounts, about  cup (120 mL) every 5-10 minutes. ? Slowly drink more until you have had the amount that your doctor said to have.  Drink enough clear fluid to keep your pee clear or pale yellow. If you were told to drink an ORS, finish the ORS  first, then start slowly drinking clear fluids. Drink fluids such as: ? Water. Do not drink only water by itself. Doing that can make the salt (sodium) level in your body get too low (hyponatremia). ? Ice chips. ? Fruit juice that you have added water to (diluted). ? Low-calorie sports drinks.  Avoid: ? Alcohol. ? Drinks that have a lot of sugar. These include high-calorie sports drinks, fruit juice that does not have water added, and soda. ? Caffeine. ? Foods that are greasy or have a lot of fat or sugar.  Take over-the-counter and prescription medicines only as told by your doctor.  Do not take salt tablets. Doing that can make the salt level in your body get too high (hypernatremia).  Eat foods that have minerals (electrolytes). Examples include bananas, oranges, potatoes, tomatoes, and spinach.  Keep all follow-up visits as told by your doctor. This is important. Contact a doctor if:  You have belly (abdominal) pain that: ? Gets worse. ? Stays in one area (localizes).  You have a rash.  You have a stiff neck.  You get angry or annoyed more easily than normal (irritability).  You are more sleepy than normal.  You have a harder time waking up than normal.  You feel: ? Weak. ? Dizzy. ? Very thirsty.  You have peed (urinated) only a small amount of very dark pee during 6-8 hours. Get help right away if:  You have symptoms of   very bad dehydration.  You cannot drink fluids without throwing up (vomiting).  Your symptoms get worse with treatment.  You have a fever.  You have a very bad headache.  You are throwing up or having watery poop (diarrhea) and it: ? Gets worse. ? Does not go away.  You have blood or something green (bile) in your throw-up.  You have blood in your poop (stool). This may cause poop to look black and tarry.  You have not peed in 6-8 hours.  You pass out (faint).  Your heart rate when you are sitting still is more than 100 beats a  minute.  You have trouble breathing. This information is not intended to replace advice given to you by your health care provider. Make sure you discuss any questions you have with your health care provider. Document Released: 09/29/2009 Document Revised: 06/22/2016 Document Reviewed: 01/27/2016 Elsevier Interactive Patient Education  2018 [employer]  

## 2017-08-07 NOTE — Progress Notes (Signed)
Subjective:  Patient ID: Michelle [name], female    DOB: 20-Jul-1951  Age: 66 y.o. MRN: [id number]  CC: Diarrhea   HPI Michelle [name] presents for a 3 day hx of profuse watery diarrhea, RLQ abd pain, loss of appetite, SOB, dizziness, and lightheadedness.  Outpatient Medications Prior to Visit  Medication Sig Dispense Refill  . acetaminophen (TYLENOL) 500 MG tablet Take by mouth every 6 (six) hours as needed.     Marland Kitchen alendronate (FOSAMAX) 70 MG tablet Take 70 mg by mouth once a week. Take with a full glass of water on an empty stomach. Takes every Tuesday.    Marland Kitchen amitriptyline (ELAVIL) 50 MG tablet Take 1 tablet (50 mg total) by mouth at bedtime. 90 tablet 3  . aspirin 81 MG tablet Take 81 mg by mouth daily.      . beta carotene 10000 UNIT capsule Take by mouth.    . betamethasone valerate (VALISONE) 0.1 % cream Apply topically.    . cholecalciferol (VITAMIN D) 1000 UNITS tablet Take 2,000 Units by mouth daily.    . Desoximetasone (TOPICORT SPRAY EX) Apply 1 spray topically as needed.    . Ginkgo Biloba 40 MG TABS Take 120 mg by mouth daily.    Marland Kitchen levothyroxine (SYNTHROID, LEVOTHROID) 50 MCG tablet TAKE 1 TABLET BY MOUTH EVERY DAY 90 tablet 1  . Omega-3 Fatty Acids (FISH OIL) 1000 MG CAPS Take 1 capsule by mouth 2 (two) times daily.    . simvastatin (ZOCOR) 10 MG tablet Take 1 tablet by mouth daily.    Marland Kitchen telmisartan (MICARDIS) 80 MG tablet Take 1 tablet (80 mg total) by mouth daily. 90 tablet 1  . torsemide (DEMADEX) 20 MG tablet Take 1 tablet (20 mg total) by mouth daily. 90 tablet 1  . triamcinolone cream (KENALOG) 0.1 %     . vitamin E 1000 UNIT capsule Take 1,000 Units by mouth daily.     No facility-administered medications prior to visit.     ROS Review of Systems  Constitutional: Positive for activity change, appetite change, diaphoresis, fatigue and unexpected weight change. Negative for fever.  HENT: Negative.  Negative for trouble swallowing.   Eyes: Negative.     Respiratory: Positive for shortness of breath. Negative for cough, chest tightness, wheezing and stridor.   Cardiovascular: Negative for chest pain, palpitations and leg swelling.  Gastrointestinal: Positive for abdominal pain, diarrhea and nausea. Negative for blood in stool, constipation and vomiting.  Endocrine: Negative.   Genitourinary: Positive for decreased urine volume. Negative for difficulty urinating.  Musculoskeletal: Negative.   Skin: Positive for pallor.  Allergic/Immunologic: Negative.   Neurological: Positive for dizziness, weakness and light-headedness.  Hematological: Negative for adenopathy. Does not bruise/bleed easily.  Psychiatric/Behavioral: Negative.     Objective:  BP (!) 70/50 (BP Location: Right Arm, Patient Position: Sitting, Cuff Size: Normal)   Pulse (!) 35   Temp 97.6 F (36.4 C) (Oral)   Resp 20   Ht 5\' 4"  (1.626 m)   Wt 123 lb 12 oz (56.1 kg)   SpO2 (!) 85%   BMI 21.24 kg/m   BP Readings from Last 3 Encounters:  08/07/17 (!) 70/50  07/01/17 (!) 138/98  06/05/17 118/80    Wt Readings from Last 3 Encounters:  08/07/17 123 lb 12 oz (56.1 kg)  07/01/17 129 lb 4 oz (58.6 kg)  06/05/17 122 lb 10 oz (55.6 kg)    Physical Exam  Constitutional: She appears toxic. She has a sickly appearance.  She appears ill. She appears distressed.  HENT:  Mouth/Throat: Oropharynx is clear and moist. Mucous membranes are pale, dry and cyanotic. No oral lesions. No uvula swelling. No oropharyngeal exudate.  Eyes: Conjunctivae are normal. Right eye exhibits no discharge. Left eye exhibits no discharge. No scleral icterus.  Neck: Normal range of motion. Neck supple. No JVD present. No thyromegaly present.  Cardiovascular: Normal rate, regular rhythm, S1 normal and S2 normal.  Exam reveals no gallop and no friction rub.   Murmur heard.  No diastolic murmur is present  Pulmonary/Chest: Tachypnea noted. She is in respiratory distress. She has no decreased breath  sounds. She has no wheezes. She has no rhonchi. She has no rales. She exhibits no tenderness.  Abdominal: Soft. Normal appearance. There is no hepatosplenomegaly, splenomegaly or hepatomegaly. There is tenderness in the right lower quadrant and suprapubic area. There is no rebound and no CVA tenderness.  Lymphadenopathy:    She has no cervical adenopathy.    Lab Results  Component Value Date   WBC 8.7 06/05/2017   HGB 12.1 06/05/2017   HCT 36.3 06/05/2017   PLT 310.0 06/05/2017   GLUCOSE 94 07/01/2017   CHOL 190 07/30/2016   TRIG 119.0 07/30/2016   HDL 48.70 07/30/2016   LDLDIRECT 113 (H) 06/16/2008   LDLCALC 118 (H) 07/30/2016   ALT 20 11/07/2016   AST 25 11/07/2016   NA 138 07/01/2017   K 3.8 07/01/2017   CL 100 07/01/2017   CREATININE 1.28 (H) 07/01/2017   BUN 24 (H) 07/01/2017   CO2 28 07/01/2017   TSH 1.20 07/01/2017   INR 1.05 06/26/2016    No results found.  Assessment & Plan:   Michelle [name] was seen today for diarrhea.  Diagnoses and all orders for this visit:  Dehydration, severe- EMS transport to the ED for IV fluids and urgent evaluation for the cause    I am having Ms. [place]on[place] maintain her aspirin, cholecalciferol, vitamin E, Fish Oil, triamcinolone cream, amitriptyline, simvastatin, acetaminophen, beta carotene, betamethasone valerate, Ginkgo Biloba, Desoximetasone (TOPICORT SPRAY EX), alendronate, levothyroxine, torsemide, and telmisartan.  No orders of the defined types were placed in this encounter.    Follow-up: No Follow-up on file.  [name], MD

## 2017-08-07 NOTE — Progress Notes (Addendum)
Pharmacy Antibiotic Note  Michelle [name] is a 66 y.o. female with weakness admitted on 08/07/2017 with sepsis.  Pharmacy has been consulted for azactam and vancomycin dosing.  Plan: Vancomycin 500 mg IV q24h  VT=15-20 mg/L Azactam 1 Gm IV q8h F/u scr/cultures/levels as needed Pt has tolerated cephalosporins in the past- consider changing to a cephalosporin   Height: 5\' 3"  (160 cm) Weight: 125 lb (56.7 kg) IBW/kg (Calculated) : 52.4  Temp (24hrs), Avg:97.6 F (36.4 C), Min:97.6 F (36.4 C), Max:97.6 F (36.4 C)   Recent Labs Lab 08/07/17 1658 08/07/17 1659  WBC 15.3*  --   CREATININE 2.62*  --   LATICACIDVEN  --  0.96    Estimated Creatinine Clearance: 17.2 mL/min (A) (by C-G formula based on SCr of 2.62 mg/dL (H)).    Allergies  Allergen Reactions  . Penicillins Rash    Has patient had a PCN reaction causing immediate rash, facial/tongue/throat swelling, SOB or lightheadedness with hypotension: Yes Has patient had a PCN reaction causing severe rash involving mucus membranes or skin necrosis: No Has patient had a PCN reaction that required hospitalization: No Has patient had a PCN reaction occurring within the last 10 years: No If all of the above answers are "NO", then may proceed with Cephalosporin use.     Antimicrobials this admission: 8/22 cipro/flagyl >> x1 ED 8/22 vancomycin >>  8/23 azactam >>  Dose adjustments this admission:   Microbiology results:  BCx:   UCx:    Sputum:    MRSA PCR:   Thank you for allowing pharmacy to be a part of this patient's care.  Dorrene German 08/07/2017 10:38 PM
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
## 2017-08-07 NOTE — ED Triage Notes (Signed)
Pt from home via EMS.  Went into PCP today, BP 70/50 there and O2 sats in 80s, placed on 2 L [place].  PT reports weakness and feeling dehydrated, unsure if she passed out during episode.  Afebrile.  A&Ox4.  500 ml NS 20 G RFA. VS: 82/54, 98% 2 L [place], 76 bpm, CBG 146

## 2017-08-07 NOTE — ED Notes (Signed)
Bed: YI50 Expected date:  Expected time:  Means of arrival:  Comments: EMS hypotension

## 2017-08-08 ENCOUNTER — Other Ambulatory Visit (HOSPITAL_COMMUNITY): Payer: Medicare HMO

## 2017-08-08 ENCOUNTER — Inpatient Hospital Stay (HOSPITAL_COMMUNITY): Payer: Medicare HMO

## 2017-08-08 DIAGNOSIS — K529 Noninfective gastroenteritis and colitis, unspecified: Secondary | ICD-10-CM

## 2017-08-08 DIAGNOSIS — R109 Unspecified abdominal pain: Secondary | ICD-10-CM | POA: Diagnosis not present

## 2017-08-08 DIAGNOSIS — N179 Acute kidney failure, unspecified: Secondary | ICD-10-CM | POA: Diagnosis not present

## 2017-08-08 DIAGNOSIS — E860 Dehydration: Secondary | ICD-10-CM

## 2017-08-08 DIAGNOSIS — A419 Sepsis, unspecified organism: Secondary | ICD-10-CM | POA: Diagnosis not present

## 2017-08-08 LAB — COMPREHENSIVE METABOLIC PANEL
ALBUMIN: 2.6 g/dL — AB (ref 3.5–5.0)
ALK PHOS: 38 U/L (ref 38–126)
ALT: 11 U/L — ABNORMAL LOW (ref 14–54)
AST: 16 U/L (ref 15–41)
Anion gap: 7 (ref 5–15)
BILIRUBIN TOTAL: 0.6 mg/dL (ref 0.3–1.2)
BUN: 56 mg/dL — ABNORMAL HIGH (ref 6–20)
CALCIUM: 7.3 mg/dL — AB (ref 8.9–10.3)
CO2: 24 mmol/L (ref 22–32)
Chloride: 106 mmol/L (ref 101–111)
Creatinine, Ser: 1.46 mg/dL — ABNORMAL HIGH (ref 0.44–1.00)
GFR calc Af Amer: 42 mL/min — ABNORMAL LOW (ref >60)
GFR calc non Af Amer: 36 mL/min — ABNORMAL LOW (ref >60)
Glucose, Bld: 110 mg/dL — ABNORMAL HIGH (ref 65–99)
POTASSIUM: 3.1 mmol/L — AB (ref 3.5–5.1)
SODIUM: 137 mmol/L (ref 135–145)
TOTAL PROTEIN: 4.9 g/dL — AB (ref 6.5–8.1)

## 2017-08-08 LAB — CBC
HEMATOCRIT: 27.7 % — AB (ref 36.0–46.0)
Hemoglobin: 9.2 g/dL — ABNORMAL LOW (ref 12.0–15.0)
MCH: 28.1 pg (ref 26.0–34.0)
MCHC: 33.2 g/dL (ref 30.0–36.0)
MCV: 84.7 fL (ref 78.0–100.0)
Platelets: 259 10*3/uL (ref 150–400)
RBC: 3.27 MIL/uL — AB (ref 3.87–5.11)
RDW: 13.9 % (ref 11.5–15.5)
WBC: 9.5 10*3/uL (ref 4.0–10.5)

## 2017-08-08 LAB — TROPONIN I

## 2017-08-08 LAB — LIPASE, BLOOD: LIPASE: 28 U/L (ref 11–51)

## 2017-08-08 LAB — MAGNESIUM: Magnesium: 1.7 mg/dL (ref 1.7–2.4)

## 2017-08-08 LAB — MRSA PCR SCREENING: MRSA BY PCR: NEGATIVE

## 2017-08-08 LAB — GLUCOSE, CAPILLARY: GLUCOSE-CAPILLARY: 85 mg/dL (ref 65–99)

## 2017-08-08 MED ORDER — DEXTROSE 5 % IV SOLN
1.0000 g | Freq: Three times a day (TID) | INTRAVENOUS | Status: DC
  Administered 2017-08-08: 1 g via INTRAVENOUS
  Filled 2017-08-08 (×2): qty 1

## 2017-08-08 MED ORDER — METRONIDAZOLE IN NACL 5-0.79 MG/ML-% IV SOLN
500.0000 mg | Freq: Three times a day (TID) | INTRAVENOUS | Status: DC
  Administered 2017-08-08: 500 mg via INTRAVENOUS
  Filled 2017-08-08 (×3): qty 100

## 2017-08-08 MED ORDER — POTASSIUM CHLORIDE CRYS ER 20 MEQ PO TBCR
40.0000 meq | EXTENDED_RELEASE_TABLET | Freq: Once | ORAL | Status: AC
  Administered 2017-08-08: 40 meq via ORAL
  Filled 2017-08-08: qty 2

## 2017-08-08 MED ORDER — MAGNESIUM SULFATE 2 GM/50ML IV SOLN
2.0000 g | Freq: Once | INTRAVENOUS | Status: AC
  Administered 2017-08-08: 2 g via INTRAVENOUS
  Filled 2017-08-08: qty 50

## 2017-08-08 MED ORDER — TRIAMCINOLONE ACETONIDE 0.1 % EX CREA
TOPICAL_CREAM | Freq: Two times a day (BID) | CUTANEOUS | Status: DC
  Administered 2017-08-08 – 2017-08-10 (×5): via TOPICAL
  Filled 2017-08-08 (×2): qty 15

## 2017-08-08 MED ORDER — DEXTROSE 5 % IV SOLN
2.0000 g | INTRAVENOUS | Status: DC
  Administered 2017-08-08: 2 g via INTRAVENOUS
  Filled 2017-08-08: qty 2

## 2017-08-08 NOTE — Progress Notes (Addendum)
PROGRESS NOTE  Michelle [name]  SWF:[id number] DOB: [birth year] DOA: 08/07/2017 PCP: [name], MD  Brief Narrative:   Pt is a 66 yo female with hx of congenital heart disease (unclear to me) s/p open heart surgery and HTN who came with cc of nausea/generalized abdominal pain/diarrhea for the past 4 days.  Last hospitalization was in February, no recent antibiotics that she can recall and no personal history of C. Diff.  Abdominal pain is in bilateral lower quadrants.    Assessment & Plan:   Active Problems:   Sepsis (Rea)  Sepsis with nausea, vomiting, diarrhea, possible but I think her mild hypotension and tachycardia were probably from dehydration.  Likely has viral gastroenteritis. -  MRSA PCR negative  > D/c vancomycin -  Doubt pneumonia as no symptoms to suggest pneumonia -  UA negative -  No evidence of diverticulitis on exam -  RUQ [place] did not suggest cholecystitis and patient no longer has significant  RUQ pain -  DC other antibiotics (ceftriaxone/flagyl) -  F/u Bcx -  GI pathogen panel -  C. Diff  HTN: hold meds.   AKI on CKD:  Baseline creatinine of 1, initially was 2.62 and trending down with IVF -  Continue IVF -  Repeat BMP in AM  Hx of cardiac disease, sounds like tetrology of fallot, continue asp and statin.  Normocytic anemia, baseline is closer to 13g/dl, currently 9.2 g/dl.  Occult stool negative -  Iron studies, B12, folate -  TSH -  Occult stool -  Repeat hgb in AM    DVT prophylaxis:  heparin Code Status:  full Family Communication:  Patient alone Disposition Plan:  Likely home in 1-2 days   Consultants:   none  Procedures:  none  Antimicrobials:  Anti-infectives    Start     Dose/Rate Route Frequency Ordered Stop   08/08/17 1400  cefTRIAXone (ROCEPHIN) 2 g in dextrose 5 % 50 mL IVPB     2 g 100 mL/hr over 30 Minutes Intravenous Every 24 hours 08/08/17 1038     08/08/17 1200  metroNIDAZOLE (FLAGYL) IVPB 500 mg     500 mg 100  mL/hr over 60 Minutes Intravenous Every 8 hours 08/08/17 1038     08/08/17 0600  aztreonam (AZACTAM) 2 g in dextrose 5 % 50 mL IVPB  Status:  Discontinued     2 g 100 mL/hr over 30 Minutes Intravenous Every 8 hours 08/07/17 2313 08/08/17 0049   08/08/17 0600  aztreonam (AZACTAM) 1 g in dextrose 5 % 50 mL IVPB  Status:  Discontinued     1 g 100 mL/hr over 30 Minutes Intravenous Every 8 hours 08/08/17 0049 08/08/17 1036   08/07/17 2300  vancomycin (VANCOCIN) 500 mg in sodium chloride 0.9 % 100 mL IVPB  Status:  Discontinued     500 mg 100 mL/hr over 60 Minutes Intravenous Daily at bedtime 08/07/17 2245 08/08/17 1036   08/07/17 1900  ciprofloxacin (CIPRO) IVPB 400 mg     400 mg 200 mL/hr over 60 Minutes Intravenous  Once 08/07/17 1859 08/07/17 2006   08/07/17 1900  metroNIDAZOLE (FLAGYL) IVPB 500 mg     500 mg 100 mL/hr over 60 Minutes Intravenous  Once 08/07/17 1859 08/07/17 2037       Subjective: No vomiting since admission but has had several loose stools.  Unable to collect stool sample because urine always gets mixed into stool    Objective: Vitals:   08/08/17 0514 08/08/17  1039 08/08/17 1324 08/08/17 1532  BP: (!) 132/98 109/65 (!) 92/49   Pulse: 71 72 68 75  Resp: 18 15 18    Temp: 98 F (36.7 C) 97.8 F (36.6 C) 97.8 F (36.6 C)   TempSrc: Oral Oral Oral   SpO2: 98% 100% 100%   Weight:      Height:        Intake/Output Summary (Last 24 hours) at 08/08/17 1746 Last data filed at 08/08/17 1513  Gross per 24 hour  Intake          1771.67 ml  Output             1300 ml  Net           471.67 ml   Filed Weights   08/07/17 1654 08/07/17 2331  Weight: 56.7 kg (125 lb) 59.2 kg (130 lb 9.6 oz)    Examination:  General exam:  Adult female.  No acute distress.  HEENT:  NCAT, MMM Respiratory system: Clear to auscultation bilaterally Cardiovascular system: Regular rate and rhythm, normal H9/Q2. 2/6 sytolic murmur.  Warm extremities Gastrointestinal system: Hyperactive  bowel sounds, soft, nondistended, nontender.  Neg murphy's sign MSK:  Normal tone and bulk, no lower extremity edema Neuro:  Grossly intact    Data Reviewed: I have personally reviewed following labs and imaging studies  CBC:  Recent Labs Lab 08/07/17 1658 08/08/17 0505  WBC 15.3* 9.5  NEUTROABS 13.6*  --   HGB 10.1* 9.2*  HCT 29.9* 27.7*  MCV 85.7 84.7  PLT 290 229   Basic Metabolic Panel:  Recent Labs Lab 08/07/17 1658 08/08/17 0505  NA 131* 137  K 3.6 3.1*  CL 95* 106  CO2 26 24  GLUCOSE 100* 110*  BUN 90* 56*  CREATININE 2.62* 1.46*  CALCIUM 7.8* 7.3*  MG  --  1.7   GFR: Estimated Creatinine Clearance: 30.9 mL/min (A) (by C-G formula based on SCr of 1.46 mg/dL (H)). Liver Function Tests:  Recent Labs Lab 08/07/17 1658 08/08/17 0505  AST 18 16  ALT 13* 11*  ALKPHOS 54 38  BILITOT 0.6 0.6  PROT 6.4* 4.9*  ALBUMIN 3.3* 2.6*    Recent Labs Lab 08/07/17 2352  LIPASE 28   No results for input(s): AMMONIA in the last 168 hours. Coagulation Profile:  Recent Labs Lab 08/07/17 1658  INR 1.10   Cardiac Enzymes:  Recent Labs Lab 08/07/17 2352 08/08/17 0505  TROPONINI <0.03 <0.03   BNP (last 3 results) No results for input(s): PROBNP in the last 8760 hours. HbA1C: No results for input(s): HGBA1C in the last 72 hours. CBG:  Recent Labs Lab 08/07/17 1643 08/08/17 0819  GLUCAP 79 85   Lipid Profile: No results for input(s): CHOL, HDL, LDLCALC, TRIG, CHOLHDL, LDLDIRECT in the last 72 hours. Thyroid Function Tests: No results for input(s): TSH, T4TOTAL, FREET4, T3FREE, THYROIDAB in the last 72 hours. Anemia Panel: No results for input(s): VITAMINB12, FOLATE, FERRITIN, TIBC, IRON, RETICCTPCT in the last 72 hours. Urine analysis:    Component Value Date/Time   COLORURINE STRAW (A) 08/07/2017 1803   APPEARANCEUR CLEAR 08/07/2017 1803   LABSPEC 1.005 08/07/2017 1803   PHURINE 7.0 08/07/2017 1803   GLUCOSEU NEGATIVE 08/07/2017 1803    GLUCOSEU NEGATIVE 07/30/2016 1638   HGBUR NEGATIVE 08/07/2017 1803   BILIRUBINUR NEGATIVE 08/07/2017 1803   BILIRUBINUR NEG 12/24/2012 1458   KETONESUR NEGATIVE 08/07/2017 1803   PROTEINUR NEGATIVE 08/07/2017 1803   UROBILINOGEN 0.2 07/30/2016 1638   NITRITE  NEGATIVE 08/07/2017 1803   LEUKOCYTESUR NEGATIVE 08/07/2017 1803   Sepsis Labs: @LABRCNTIP (procalcitonin:4,lacticidven:4)  ) Recent Results (from the past 240 hour(s))  Culture, blood (Routine x 2)     Status: None (Preliminary result)   Collection Time: 08/07/17  4:58 PM  Result Value Ref Range Status   Specimen Description LEFT ANTECUBITAL  Final   Special Requests   Final    BOTTLES DRAWN AEROBIC AND ANAEROBIC Blood Culture adequate volume   Culture   Final    NO GROWTH < 24 HOURS Performed at [facility], [place] [address] [place], [place] [address]    Report Status PENDING  Incomplete  Culture, blood (Routine x 2)     Status: None (Preliminary result)   Collection Time: 08/07/17  6:04 PM  Result Value Ref Range Status   Specimen Description BLOOD LEFT HAND  Final   Special Requests   Final    BOTTLES DRAWN AEROBIC ONLY Blood Culture adequate volume   Culture   Final    NO GROWTH < 12 HOURS Performed at [facility], [place] [address], [place], [place] [address]    Report Status PENDING  Incomplete  MRSA PCR Screening     Status: None   Collection Time: 08/08/17  9:10 AM  Result Value Ref Range Status   MRSA by PCR NEGATIVE NEGATIVE Final    Comment:        The GeneXpert MRSA Assay (FDA approved for NASAL specimens only), is one component of a comprehensive MRSA colonization surveillance program. It is not intended to diagnose MRSA infection nor to guide or monitor treatment for MRSA infections.       Radiology Studies: Ct Abdomen Pelvis Wo Contrast  Result Date: 08/07/2017 CLINICAL DATA:  Hypotensive.  Abdominal pain. EXAM: CT ABDOMEN AND PELVIS WITHOUT CONTRAST TECHNIQUE: Multidetector CT  imaging of the abdomen and pelvis was performed following the standard protocol without IV contrast. Oral contrast administered. COMPARISON:  Abdominal radiograph August 07, 2017 at 1719 hours and CT abdomen and pelvis June 26, 2016 FINDINGS: LOWER CHEST: Minimal RIGHT lower lobe tree-in-bud infiltrates. Bibasilar atelectasis/scarring. Heart size is normal, status post tricuspid valve replacement. No pericardial effusion. HEPATOBILIARY: Normal. PANCREAS: Normal. SPLEEN: Normal. ADRENALS/URINARY TRACT: Kidneys are orthotopic, demonstrating normal size and morphology. LEFT lower pole scarring. No nephrolithiasis, hydronephrosis; limited assessment for renal masses on this nonenhanced examination. The unopacified ureters are normal in course and caliber. Urinary bladder is well distended and unremarkable. Normal adrenal glands. STOMACH/BOWEL: Small hiatal hernia. The stomach, small and large bowel are normal in course and caliber without inflammatory changes. Moderate sigmoid colon diverticulosis. Narrowed colon RIGHT upper quadrant compatible with peristalsis, no discrete mass. Normal appendix. VASCULAR/LYMPHATIC: Aortoiliac vessels are normal in course and caliber. Moderate calcific atherosclerosis No lymphadenopathy by CT size criteria. REPRODUCTIVE: Normal. OTHER: No intraperitoneal free fluid or free air. MUSCULOSKELETAL: Non-acute. Grade 1 L4-5 anterolisthesis without spondylolysis. Moderate lower lumbar facet arthropathy. Small fat containing umbilical hernia. IMPRESSION: 1. No acute intra-abdominal/pelvic process. 2. Moderate sigmoid diverticulosis. 3. Minimal RIGHT lower lobe tree-in-bud infiltrates may be infectious or inflammatory. Aortic Atherosclerosis (ICD10-I70.0). Electronically Signed   By: Elon Alas M.D.   On: 08/07/2017 21:45   Dg Chest Portable 1 View  Result Date: 08/07/2017 CLINICAL DATA:  Chest pain and hypoxia EXAM: PORTABLE CHEST 1 VIEW COMPARISON:  08/09/2016 MRI and CXR 06/26/2016  FINDINGS: Stable cardiomegaly with aortic atherosclerosis. Status post tetralogy of Fallot and cardiac valvular repair with sternal fixation hardware in  place. Hyperinflated lungs without pneumonic consolidation, effusion or pneumothorax. No acute osseous abnormality. IMPRESSION: Stable cardiomegaly with aortic atherosclerosis. No acute pulmonary disease. Electronically Signed   By: Ashley Royalty M.D.   On: 08/07/2017 18:03   Dg Abd Portable 1 View  Result Date: 08/07/2017 CLINICAL DATA:  Dehydration. Patient feels weak and is unsure if she passed out. EXAM: PORTABLE ABDOMEN - 1 VIEW COMPARISON:  06/26/2016 CT FINDINGS: Scattered air containing small and large bowel loops in a nonobstructive nondistended bowel gas pattern. No radio-opaque calculi. Lower lumbar degenerative disc and facet arthropathy L4 through S1. Mild SI joint osteoarthritis with sclerosis. Both hip joints are maintained. No acute osseous abnormality. IMPRESSION: Unremarkable bowel gas pattern.  No radiopaque calculi. Electronically Signed   By: Ashley Royalty M.D.   On: 08/07/2017 17:58   US Abdomen Limited Ruq  Result Date: 08/08/2017 CLINICAL DATA:  Two weeks of abdominal pain EXAM: ULTRASOUND ABDOMEN LIMITED RIGHT UPPER QUADRANT COMPARISON:  Abdominopelvic CT scan of August 07, 2017 FINDINGS: Gallbladder: No gallstones or wall thickening visualized. No sonographic Murphy sign noted by sonographer. Common bile duct: Diameter: 2 mm Liver: No focal lesion identified. Within normal limits in parenchymal echogenicity. Portal vein is patent on color Doppler imaging with normal direction of blood flow towards the liver. IMPRESSION: Normal right upper quadrant abdominal ultrasound. If gallbladder dysfunction is suspected clinically, a nuclear medicine hepatobiliary scan with gallbladder ejection fraction determination may be useful. Electronically Signed   By: David  Martinique M.D.   On: 08/08/2017 12:57     Scheduled Meds: . amitriptyline  50 mg  Oral QHS  . aspirin  81 mg Oral Daily  . heparin  5,000 Units Subcutaneous Q8H  . levothyroxine  50 mcg Oral QAC breakfast  . pantoprazole (PROTONIX) IV  40 mg Intravenous QHS  . simvastatin  10 mg Oral q1800  . triamcinolone cream   Topical BID   Continuous Infusions: . cefTRIAXone (ROCEPHIN)  IV Stopped (08/08/17 1445)  . dextrose 5 % and 0.45% NaCl 100 mL/hr at 08/08/17 1202  . metronidazole Stopped (08/08/17 1353)     LOS: 1 day    Time spent: 30 min    Janece Canterbury, MD [facility] Pager [phone]  If 7PM-7AM, please contact night-coverage [email or web address] Password [facility] 08/08/2017, 5:46 PM

## 2017-08-08 NOTE — Progress Notes (Signed)
GI panel sent to lab. SRP, RN

## 2017-08-09 ENCOUNTER — Ambulatory Visit (HOSPITAL_COMMUNITY): Payer: Medicare HMO

## 2017-08-09 DIAGNOSIS — A419 Sepsis, unspecified organism: Secondary | ICD-10-CM | POA: Diagnosis not present

## 2017-08-09 DIAGNOSIS — K529 Noninfective gastroenteritis and colitis, unspecified: Secondary | ICD-10-CM | POA: Diagnosis not present

## 2017-08-09 DIAGNOSIS — N179 Acute kidney failure, unspecified: Secondary | ICD-10-CM | POA: Diagnosis not present

## 2017-08-09 DIAGNOSIS — E860 Dehydration: Secondary | ICD-10-CM | POA: Diagnosis not present

## 2017-08-09 LAB — GASTROINTESTINAL PANEL BY PCR, STOOL (REPLACES STOOL CULTURE)
ADENOVIRUS F40/41: NOT DETECTED
ASTROVIRUS: NOT DETECTED
CYCLOSPORA CAYETANENSIS: NOT DETECTED
Campylobacter species: NOT DETECTED
Cryptosporidium: NOT DETECTED
ENTAMOEBA HISTOLYTICA: NOT DETECTED
ENTEROAGGREGATIVE E COLI (EAEC): NOT DETECTED
ENTEROPATHOGENIC E COLI (EPEC): NOT DETECTED
ENTEROTOXIGENIC E COLI (ETEC): NOT DETECTED
GIARDIA LAMBLIA: NOT DETECTED
Norovirus GI/GII: NOT DETECTED
Plesimonas shigelloides: NOT DETECTED
Rotavirus A: NOT DETECTED
SAPOVIRUS (I, II, IV, AND V): NOT DETECTED
Salmonella species: NOT DETECTED
Shiga like toxin producing E coli (STEC): NOT DETECTED
Shigella/Enteroinvasive E coli (EIEC): NOT DETECTED
VIBRIO CHOLERAE: NOT DETECTED
VIBRIO SPECIES: NOT DETECTED
Yersinia enterocolitica: NOT DETECTED

## 2017-08-09 LAB — CBC
HEMATOCRIT: 27.7 % — AB (ref 36.0–46.0)
Hemoglobin: 9.4 g/dL — ABNORMAL LOW (ref 12.0–15.0)
MCH: 28.8 pg (ref 26.0–34.0)
MCHC: 33.9 g/dL (ref 30.0–36.0)
MCV: 85 fL (ref 78.0–100.0)
Platelets: 290 10*3/uL (ref 150–400)
RBC: 3.26 MIL/uL — ABNORMAL LOW (ref 3.87–5.11)
RDW: 14 % (ref 11.5–15.5)
WBC: 6.2 10*3/uL (ref 4.0–10.5)

## 2017-08-09 LAB — IRON AND TIBC
Iron: 57 ug/dL (ref 28–170)
SATURATION RATIOS: 23 % (ref 10.4–31.8)
TIBC: 249 ug/dL — AB (ref 250–450)
UIBC: 192 ug/dL

## 2017-08-09 LAB — BASIC METABOLIC PANEL
Anion gap: 4 — ABNORMAL LOW (ref 5–15)
BUN: 24 mg/dL — AB (ref 6–20)
CO2: 25 mmol/L (ref 22–32)
Calcium: 7.7 mg/dL — ABNORMAL LOW (ref 8.9–10.3)
Chloride: 109 mmol/L (ref 101–111)
Creatinine, Ser: 1.1 mg/dL — ABNORMAL HIGH (ref 0.44–1.00)
GFR calc Af Amer: 59 mL/min — ABNORMAL LOW (ref >60)
GFR, EST NON AFRICAN AMERICAN: 51 mL/min — AB (ref >60)
GLUCOSE: 95 mg/dL (ref 65–99)
POTASSIUM: 3.6 mmol/L (ref 3.5–5.1)
Sodium: 138 mmol/L (ref 135–145)

## 2017-08-09 LAB — VITAMIN B12: Vitamin B-12: 670 pg/mL (ref 180–914)

## 2017-08-09 LAB — GLUCOSE, CAPILLARY: Glucose-Capillary: 94 mg/dL (ref 65–99)

## 2017-08-09 LAB — TSH: TSH: 7.852 u[IU]/mL — AB (ref 0.350–4.500)

## 2017-08-09 LAB — FERRITIN: FERRITIN: 101 ng/mL (ref 11–307)

## 2017-08-09 LAB — FOLATE: Folate: 27 ng/mL (ref >5.9)

## 2017-08-09 MED ORDER — PANTOPRAZOLE SODIUM 40 MG PO TBEC
40.0000 mg | DELAYED_RELEASE_TABLET | Freq: Every day | ORAL | Status: DC
  Administered 2017-08-09: 40 mg via ORAL
  Filled 2017-08-09: qty 1

## 2017-08-09 NOTE — Progress Notes (Addendum)
PROGRESS NOTE  Michelle [name]  BSW:[id number] DOB: Sep 16, 1951 DOA: 08/07/2017 PCP: [name], MD  Brief Narrative:   Pt is a 66 yo female with hx of congenital heart disease (unclear to me) s/p open heart surgery and HTN who came with cc of nausea/generalized abdominal pain/diarrhea for the past 4 days.  Last hospitalization was in February, no recent antibiotics that she can recall and no personal history of C. Diff.  Abdominal pain is in bilateral lower quadrants in improving.    Assessment & Plan:   Active Problems:   Sepsis ([place])  Sepsis with nausea, vomiting, diarrhea, possible but I think her mild hypotension and tachycardia were probably from dehydration.  Likely has viral gastroenteritis. -  RUQ [place] did not suggest cholecystitis and patient no longer has significant  RUQ pain -  Bcx NGTD -  GI pathogen panel negative  -  Encourage PO, advanced diet -  If tolerating PO, can discharge tomorrow morning  HTN: hold meds.   AKI on CKD 3:  Baseline creatinine of 1, initially was 2.62 and trending down with IVF -  Continue IVF -  Repeat BMP in AM  Hx of cardiac disease, sounds like tetrology of fallot, continue asp and statin.  Normocytic anemia, baseline is closer to 13g/dl, currently 9.2 g/dl.  Occult stool negative -  Iron studies, B12, folate wnl -  TSH 7.852, may be elevated secondary to recent illness >> need to repeat in a few weeks.   -  Occult stool negative -  Hemoglobin approximately stable > repeat as outpatient in 1-2 weeks  DVT prophylaxis:  heparin Code Status:  full Family Communication:  Patient alone Disposition Plan:  Likely home tomorrow    Consultants:   none  Procedures:  none  Antimicrobials:  Anti-infectives    Start     Dose/Rate Route Frequency Ordered Stop   08/08/17 1400  cefTRIAXone (ROCEPHIN) 2 g in dextrose 5 % 50 mL IVPB  Status:  Discontinued     2 g 100 mL/hr over 30 Minutes Intravenous Every 24 hours 08/08/17 1038  08/08/17 1751   08/08/17 1200  metroNIDAZOLE (FLAGYL) IVPB 500 mg  Status:  Discontinued     500 mg 100 mL/hr over 60 Minutes Intravenous Every 8 hours 08/08/17 1038 08/08/17 1751   08/08/17 0600  aztreonam (AZACTAM) 2 g in dextrose 5 % 50 mL IVPB  Status:  Discontinued     2 g 100 mL/hr over 30 Minutes Intravenous Every 8 hours 08/07/17 2313 08/08/17 0049   08/08/17 0600  aztreonam (AZACTAM) 1 g in dextrose 5 % 50 mL IVPB  Status:  Discontinued     1 g 100 mL/hr over 30 Minutes Intravenous Every 8 hours 08/08/17 0049 08/08/17 1036   08/07/17 2300  vancomycin (VANCOCIN) 500 mg in sodium chloride 0.9 % 100 mL IVPB  Status:  Discontinued     500 mg 100 mL/hr over 60 Minutes Intravenous Daily at bedtime 08/07/17 2245 08/08/17 1036   08/07/17 1900  ciprofloxacin (CIPRO) IVPB 400 mg     400 mg 200 mL/hr over 60 Minutes Intravenous  Once 08/07/17 1859 08/07/17 2006   08/07/17 1900  metroNIDAZOLE (FLAGYL) IVPB 500 mg     500 mg 100 mL/hr over 60 Minutes Intravenous  Once 08/07/17 1859 08/07/17 2037       Subjective: No vomiting and stools are firming up.  Still having some nausea.  Abdominal pain is improving.    Objective: Vitals:  08/08/17 1532 08/08/17 2116 08/09/17 0622 08/09/17 1450  BP:  104/64 129/78 95/63  Pulse: 75 69 72 72  Resp:  17 16 16   Temp:  98 F (36.7 C) 97.7 F (36.5 C) 97.9 F (36.6 C)  TempSrc:  Oral Oral Oral  SpO2:  100% 100% 100%  Weight:      Height:        Intake/Output Summary (Last 24 hours) at 08/09/17 1755 Last data filed at 08/09/17 0900  Gross per 24 hour  Intake          1778.33 ml  Output             1200 ml  Net           578.33 ml   Filed Weights   08/07/17 1654 08/07/17 2331  Weight: 56.7 kg (125 lb) 59.2 kg (130 lb 9.6 oz)    Examination:  General exam:  Adult female.  No acute distress.  HEENT:  NCAT, MMM Respiratory system: Clear to auscultation bilaterally Cardiovascular system: Regular rate and rhythm, normal N2/T5. 2/6  sytolic murmur.  Warm extremities Gastrointestinal system:  Normal active bowel sounds, soft, nondistended, TTP in the left lower quadrant without rebound or guarding.  Neg murphy's sign MSK:  Normal tone and bulk, no lower extremity edema Neuro:  Grossly intact    Data Reviewed: I have personally reviewed following labs and imaging studies  CBC:  Recent Labs Lab 08/07/17 1658 08/08/17 0505 08/09/17 0425  WBC 15.3* 9.5 6.2  NEUTROABS 13.6*  --   --   HGB 10.1* 9.2* 9.4*  HCT 29.9* 27.7* 27.7*  MCV 85.7 84.7 85.0  PLT 290 259 573   Basic Metabolic Panel:  Recent Labs Lab 08/07/17 1658 08/08/17 0505 08/09/17 0425  NA 131* 137 138  K 3.6 3.1* 3.6  CL 95* 106 109  CO2 26 24 25   GLUCOSE 100* 110* 95  BUN 90* 56* 24*  CREATININE 2.62* 1.46* 1.10*  CALCIUM 7.8* 7.3* 7.7*  MG  --  1.7  --    GFR: Estimated Creatinine Clearance: 41.1 mL/min (A) (by C-G formula based on SCr of 1.1 mg/dL (H)). Liver Function Tests:  Recent Labs Lab 08/07/17 1658 08/08/17 0505  AST 18 16  ALT 13* 11*  ALKPHOS 54 38  BILITOT 0.6 0.6  PROT 6.4* 4.9*  ALBUMIN 3.3* 2.6*    Recent Labs Lab 08/07/17 2352  LIPASE 28   No results for input(s): AMMONIA in the last 168 hours. Coagulation Profile:  Recent Labs Lab 08/07/17 1658  INR 1.10   Cardiac Enzymes:  Recent Labs Lab 08/07/17 2352 08/08/17 0505  TROPONINI <0.03 <0.03   BNP (last 3 results) No results for input(s): PROBNP in the last 8760 hours. HbA1C: No results for input(s): HGBA1C in the last 72 hours. CBG:  Recent Labs Lab 08/07/17 1643 08/08/17 0819 08/09/17 0823  GLUCAP 79 85 94   Lipid Profile: No results for input(s): CHOL, HDL, LDLCALC, TRIG, CHOLHDL, LDLDIRECT in the last 72 hours. Thyroid Function Tests:  Recent Labs  08/09/17 0447  TSH 7.852*   Anemia Panel:  Recent Labs  08/09/17 0425 08/09/17 0447  VITAMINB12 670  --   FOLATE  --  27.0  FERRITIN 101  --   TIBC 249*  --   IRON 57   --    Urine analysis:    Component Value Date/Time   COLORURINE STRAW (A) 08/07/2017 1803   APPEARANCEUR CLEAR 08/07/2017 1803   LABSPEC  1.005 08/07/2017 1803   PHURINE 7.0 08/07/2017 1803   GLUCOSEU NEGATIVE 08/07/2017 1803   GLUCOSEU NEGATIVE 07/30/2016 1638   HGBUR NEGATIVE 08/07/2017 1803   BILIRUBINUR NEGATIVE 08/07/2017 1803   BILIRUBINUR NEG 12/24/2012 1458   KETONESUR NEGATIVE 08/07/2017 1803   PROTEINUR NEGATIVE 08/07/2017 1803   UROBILINOGEN 0.2 07/30/2016 1638   NITRITE NEGATIVE 08/07/2017 1803   LEUKOCYTESUR NEGATIVE 08/07/2017 1803   Sepsis Labs: @LABRCNTIP (procalcitonin:4,lacticidven:4)  ) Recent Results (from the past 240 hour(s))  Culture, blood (Routine x 2)     Status: None (Preliminary result)   Collection Time: 08/07/17  4:58 PM  Result Value Ref Range Status   Specimen Description LEFT ANTECUBITAL  Final   Special Requests   Final    BOTTLES DRAWN AEROBIC AND ANAEROBIC Blood Culture adequate volume   Culture   Final    NO GROWTH 2 DAYS Performed at [facility], [place] [address], [place], [place] [address]    Report Status PENDING  Incomplete  Culture, blood (Routine x 2)     Status: None (Preliminary result)   Collection Time: 08/07/17  6:04 PM  Result Value Ref Range Status   Specimen Description BLOOD LEFT HAND  Final   Special Requests   Final    BOTTLES DRAWN AEROBIC ONLY Blood Culture adequate volume   Culture   Final    NO GROWTH 2 DAYS Performed at [facility], [place] [address] [place], [place] [address]    Report Status PENDING  Incomplete  MRSA PCR Screening     Status: None   Collection Time: 08/08/17  9:10 AM  Result Value Ref Range Status   MRSA by PCR NEGATIVE NEGATIVE Final    Comment:        The GeneXpert MRSA Assay (FDA approved for NASAL specimens only), is one component of a comprehensive MRSA colonization surveillance program. It is not intended to diagnose MRSA infection nor to guide or monitor treatment  for MRSA infections.   Gastrointestinal Panel by PCR , Stool     Status: None   Collection Time: 08/08/17  5:35 PM  Result Value Ref Range Status   Campylobacter species NOT DETECTED NOT DETECTED Final   Plesimonas shigelloides NOT DETECTED NOT DETECTED Final   Salmonella species NOT DETECTED NOT DETECTED Final   Yersinia enterocolitica NOT DETECTED NOT DETECTED Final   Vibrio species NOT DETECTED NOT DETECTED Final   Vibrio cholerae NOT DETECTED NOT DETECTED Final   Enteroaggregative E coli (EAEC) NOT DETECTED NOT DETECTED Final   Enteropathogenic E coli (EPEC) NOT DETECTED NOT DETECTED Final   Enterotoxigenic E coli (ETEC) NOT DETECTED NOT DETECTED Final   Shiga like toxin producing E coli (STEC) NOT DETECTED NOT DETECTED Final   Shigella/Enteroinvasive E coli (EIEC) NOT DETECTED NOT DETECTED Final   Cryptosporidium NOT DETECTED NOT DETECTED Final   Cyclospora cayetanensis NOT DETECTED NOT DETECTED Final   Entamoeba histolytica NOT DETECTED NOT DETECTED Final   Giardia lamblia NOT DETECTED NOT DETECTED Final   Adenovirus F40/41 NOT DETECTED NOT DETECTED Final   Astrovirus NOT DETECTED NOT DETECTED Final   Norovirus GI/GII NOT DETECTED NOT DETECTED Final   Rotavirus A NOT DETECTED NOT DETECTED Final   Sapovirus (I, II, IV, and V) NOT DETECTED NOT DETECTED Final      Radiology Studies: Ct Abdomen Pelvis Wo Contrast  Result Date: 08/07/2017 CLINICAL DATA:  Hypotensive.  Abdominal pain. EXAM: CT ABDOMEN AND PELVIS WITHOUT CONTRAST TECHNIQUE: Multidetector CT imaging of  the abdomen and pelvis was performed following the standard protocol without IV contrast. Oral contrast administered. COMPARISON:  Abdominal radiograph August 07, 2017 at 1719 hours and CT abdomen and pelvis June 26, 2016 FINDINGS: LOWER CHEST: Minimal RIGHT lower lobe tree-in-bud infiltrates. Bibasilar atelectasis/scarring. Heart size is normal, status post tricuspid valve replacement. No pericardial effusion.  HEPATOBILIARY: Normal. PANCREAS: Normal. SPLEEN: Normal. ADRENALS/URINARY TRACT: Kidneys are orthotopic, demonstrating normal size and morphology. LEFT lower pole scarring. No nephrolithiasis, hydronephrosis; limited assessment for renal masses on this nonenhanced examination. The unopacified ureters are normal in course and caliber. Urinary bladder is well distended and unremarkable. Normal adrenal glands. STOMACH/BOWEL: Small hiatal hernia. The stomach, small and large bowel are normal in course and caliber without inflammatory changes. Moderate sigmoid colon diverticulosis. Narrowed colon RIGHT upper quadrant compatible with peristalsis, no discrete mass. Normal appendix. VASCULAR/LYMPHATIC: Aortoiliac vessels are normal in course and caliber. Moderate calcific atherosclerosis No lymphadenopathy by CT size criteria. REPRODUCTIVE: Normal. OTHER: No intraperitoneal free fluid or free air. MUSCULOSKELETAL: Non-acute. Grade 1 L4-5 anterolisthesis without spondylolysis. Moderate lower lumbar facet arthropathy. Small fat containing umbilical hernia. IMPRESSION: 1. No acute intra-abdominal/pelvic process. 2. Moderate sigmoid diverticulosis. 3. Minimal RIGHT lower lobe tree-in-bud infiltrates may be infectious or inflammatory. Aortic Atherosclerosis (ICD10-I70.0). Electronically Signed   By: Elon Alas M.D.   On: 08/07/2017 21:45   US Abdomen Limited Ruq  Result Date: 08/08/2017 CLINICAL DATA:  Two weeks of abdominal pain EXAM: ULTRASOUND ABDOMEN LIMITED RIGHT UPPER QUADRANT COMPARISON:  Abdominopelvic CT scan of August 07, 2017 FINDINGS: Gallbladder: No gallstones or wall thickening visualized. No sonographic Murphy sign noted by sonographer. Common bile duct: Diameter: 2 mm Liver: No focal lesion identified. Within normal limits in parenchymal echogenicity. Portal vein is patent on color Doppler imaging with normal direction of blood flow towards the liver. IMPRESSION: Normal right upper quadrant abdominal  ultrasound. If gallbladder dysfunction is suspected clinically, a nuclear medicine hepatobiliary scan with gallbladder ejection fraction determination may be useful. Electronically Signed   By: David  Martinique M.D.   On: 08/08/2017 12:57     Scheduled Meds: . amitriptyline  50 mg Oral QHS  . aspirin  81 mg Oral Daily  . heparin  5,000 Units Subcutaneous Q8H  . levothyroxine  50 mcg Oral QAC breakfast  . pantoprazole  40 mg Oral Daily  . simvastatin  10 mg Oral q1800  . triamcinolone cream   Topical BID   Continuous Infusions: . dextrose 5 % and 0.45% NaCl 100 mL/hr at 08/09/17 0053     LOS: 2 days    Time spent: 30 min    Janece Canterbury, MD [facility] Pager [phone]  If 7PM-7AM, please contact night-coverage [email or web address] Password [email or web address] 08/09/2017, 5:55 PM

## 2017-08-10 DIAGNOSIS — A419 Sepsis, unspecified organism: Secondary | ICD-10-CM | POA: Diagnosis not present

## 2017-08-10 DIAGNOSIS — N179 Acute kidney failure, unspecified: Secondary | ICD-10-CM | POA: Diagnosis not present

## 2017-08-10 DIAGNOSIS — E860 Dehydration: Secondary | ICD-10-CM | POA: Diagnosis not present

## 2017-08-10 DIAGNOSIS — K529 Noninfective gastroenteritis and colitis, unspecified: Secondary | ICD-10-CM | POA: Diagnosis not present

## 2017-08-10 LAB — GLUCOSE, CAPILLARY: Glucose-Capillary: 77 mg/dL (ref 65–99)

## 2017-08-10 NOTE — Discharge Summary (Signed)
Physician Discharge Summary  NOVICE VRBA ZDG:[id number] DOB: 08-29-1951 DOA: 08/07/2017  PCP: [name], MD  Admit date: 08/07/2017 Discharge date: 08/10/2017  Admitted From: home  Disposition:  home  Recommendations for Outpatient Follow-up:  1. Referral to Neurology and Psychiatry for memory loss 2. Encouraged patient to push fluids 3. Follow up with PCP in 2 days.  Consider resuming losartan-HCTZ if blood pressure and creatinine tolerate.  Please review medications because I think she may have continued taking her telmisartan and torsemide even though [facility] had replaced these medications with Hyzaar.   4. Recommend 24 hour supervision given memory loss  Home Health:  none  Equipment/Devices:  none  Discharge Condition:  Stable, improved CODE STATUS:  Full code  Diet recommendation:  Low sodium   Brief/Interim Summary:  Pt is a 66 yo female with hx of congenital heart disease (unclear to me) s/p open heart surgery and HTN who came with cc of nausea/generalized abdominal pain/diarrhea for the past 4 days.  Last hospitalization was in February, no recent antibiotics that she can recall and no personal history of C. Diff.  CT scan demonstrated no evidence of diverticulitis or cholecystitis.  She had some incidentally mild tree-in-bud findings in the RLL but had no respiratory symptoms.  Abdominal pain was in bilateral lower quadrants and gradually improved.  Although she was initially started on broad spectrum antibiotics while there was suspicion for possible cholecystitis, her antibiotics were quickly discontinued and she continued to clinically improve.  A GI pathogen panel was negative.  She was dehydrated with acute kidney injury at admission and she seems confused about her medications.  She may also have been taking torsemide and telmisartan in addition to the Hyzaar that was recently prescribed by her cardiology office.  Her AKI resolved quickly with IVF.  I have asked her  to stop her torsemide, telmisartan, and Hyzaar for now until she can see her PCP in 2 days.    Separately, she had a childlike affect during her stay here.  On my first day of rounding, she talked about having watery stools confirming history from H&P.  On the date of discharge, she seemed confused when I talked about her diarrhea getting better and she claimed she had no memory of the last several days.  She claimed she remembered seeing me the day prior, but did not remember what we discussed.  Perhaps she has some memory loss/early dementia but I suspect she has some psychiatric component.  I recommended that she discuss her memory problems with her PCP and consider seeing a Neurologist or Psychiatrist.     Discharge Diagnoses:  Active Problems:   Sepsis ([place])  Viral gastroenteritis with nausea, vomiting, diarrhea.  Mild hypotension and tachycardia were probably from dehydration and resolved quickly with IVF.   -  RUQ [place] did not suggest cholecystitis and patient no longer has significant  RUQ pain -  Bcx NGTD -  GI pathogen panel negative  -  Tolerating a soft diet  HTN, held all medications so that PCP can review her medications and resume Hyzaar in two days if she continues to improve.    AKI on CKD stage 3:  Baseline creatinine of 1 mg/dl, initially was 2.62 mg/dl and trended down to 1.1 mg/dl  with IVF  Tetrology of Fallot, followed at [facility].  Continue aspirin and statin.  Normocytic anemia, baseline is closer to 13g/dl, currently 9.2 g/dl.  Occult stool negative -  Iron studies, B12, folate  wnl -  TSH 7.852, may be elevated secondary to recent illness >> need to repeat in a few weeks.   -  Occult stool negative -  Hemoglobin approximately stable > repeat as outpatient in 1-2 weeks  Memory loss - e.g.  When I asked her what kind of heart problem she had, she could not remember.  When I prompted her with TOF, she said, "I think that sounds right."   -  Neurology/Psychiatry  follow up  Discharge Instructions  Discharge Instructions    (HEART FAILURE PATIENTS) Call MD:  Anytime you have any of the following symptoms: 1) 3 pound weight gain in 24 hours or 5 pounds in 1 week 2) shortness of breath, with or without a dry hacking cough 3) swelling in the hands, feet or stomach 4) if you have to sleep on extra pillows at night in order to breathe.    Complete by:  As directed    Call MD for:  persistant dizziness or light-headedness    Complete by:  As directed    Call MD for:  persistant nausea and vomiting    Complete by:  As directed    Diet - low sodium heart healthy    Complete by:  As directed    Discharge instructions    Complete by:  As directed    Please stop your torsemide and some of your blood pressure medications until you see your primary care doctor in 2-3 days.   Increase activity slowly    Complete by:  As directed        Medication List    STOP taking these medications   losartan-hydrochlorothiazide 100-25 MG tablet Commonly known as:  HYZAAR   telmisartan 80 MG tablet Commonly known as:  MICARDIS   torsemide 20 MG tablet Commonly known as:  DEMADEX     TAKE these medications   acetaminophen 500 MG tablet Commonly known as:  TYLENOL Take by mouth every 6 (six) hours as needed.   amitriptyline 50 MG tablet Commonly known as:  ELAVIL Take 1 tablet (50 mg total) by mouth at bedtime.   aspirin 81 MG tablet Take 81 mg by mouth daily.   beta carotene 10000 UNIT capsule Take 10,000 Units by mouth daily.   betamethasone valerate 0.1 % cream Commonly known as:  VALISONE Apply 1 application topically 2 (two) times daily.   cholecalciferol 1000 units tablet Commonly known as:  VITAMIN D Take 2,000 Units by mouth daily.   Fish Oil 1000 MG Caps Take 1,000 mg by mouth 2 (two) times daily.   Ginkgo Biloba 40 MG Tabs Take 120 mg by mouth daily.   hydrOXYzine 10 MG tablet Commonly known as:  ATARAX/VISTARIL Take 10-30 mg by  mouth every 4 (four) hours.   levothyroxine 50 MCG tablet Commonly known as:  SYNTHROID, LEVOTHROID TAKE 1 TABLET BY MOUTH EVERY DAY   simvastatin 10 MG tablet Commonly known as:  ZOCOR Take 1 tablet by mouth daily.   TOPICORT SPRAY EX Apply 1 spray topically as needed.   triamcinolone cream 0.1 % Commonly known as:  KENALOG Apply 1 application topically 2 (two) times daily.   vitamin E 1000 UNIT capsule Take 1,000 Units by mouth daily.      Follow-up Information    [name], MD. Schedule an appointment as soon as possible for a visit in 2 day(s).   Specialty:  Internal Medicine Contact information: [address] [address] [phone]  Allergies  Allergen Reactions  . Penicillins Rash    Has patient had a PCN reaction causing immediate rash, facial/tongue/throat swelling, SOB or lightheadedness with hypotension: Yes Has patient had a PCN reaction causing severe rash involving mucus membranes or skin necrosis: No Has patient had a PCN reaction that required hospitalization: No Has patient had a PCN reaction occurring within the last 10 years: No If all of the above answers are "NO", then may proceed with Cephalosporin use.     Consultations: None   Procedures/Studies: Ct Abdomen Pelvis Wo Contrast  Result Date: 08/07/2017 CLINICAL DATA:  Hypotensive.  Abdominal pain. EXAM: CT ABDOMEN AND PELVIS WITHOUT CONTRAST TECHNIQUE: Multidetector CT imaging of the abdomen and pelvis was performed following the standard protocol without IV contrast. Oral contrast administered. COMPARISON:  Abdominal radiograph August 07, 2017 at 1719 hours and CT abdomen and pelvis June 26, 2016 FINDINGS: LOWER CHEST: Minimal RIGHT lower lobe tree-in-bud infiltrates. Bibasilar atelectasis/scarring. Heart size is normal, status post tricuspid valve replacement. No pericardial effusion. HEPATOBILIARY: Normal. PANCREAS: Normal. SPLEEN: Normal.  ADRENALS/URINARY TRACT: Kidneys are orthotopic, demonstrating normal size and morphology. LEFT lower pole scarring. No nephrolithiasis, hydronephrosis; limited assessment for renal masses on this nonenhanced examination. The unopacified ureters are normal in course and caliber. Urinary bladder is well distended and unremarkable. Normal adrenal glands. STOMACH/BOWEL: Small hiatal hernia. The stomach, small and large bowel are normal in course and caliber without inflammatory changes. Moderate sigmoid colon diverticulosis. Narrowed colon RIGHT upper quadrant compatible with peristalsis, no discrete mass. Normal appendix. VASCULAR/LYMPHATIC: Aortoiliac vessels are normal in course and caliber. Moderate calcific atherosclerosis No lymphadenopathy by CT size criteria. REPRODUCTIVE: Normal. OTHER: No intraperitoneal free fluid or free air. MUSCULOSKELETAL: Non-acute. Grade 1 L4-5 anterolisthesis without spondylolysis. Moderate lower lumbar facet arthropathy. Small fat containing umbilical hernia. IMPRESSION: 1. No acute intra-abdominal/pelvic process. 2. Moderate sigmoid diverticulosis. 3. Minimal RIGHT lower lobe tree-in-bud infiltrates may be infectious or inflammatory. Aortic Atherosclerosis (ICD10-I70.0). Electronically Signed   By: Elon Alas M.D.   On: 08/07/2017 21:45   Dg Chest Portable 1 View  Result Date: 08/07/2017 CLINICAL DATA:  Chest pain and hypoxia EXAM: PORTABLE CHEST 1 VIEW COMPARISON:  08/09/2016 MRI and CXR 06/26/2016 FINDINGS: Stable cardiomegaly with aortic atherosclerosis. Status post tetralogy of Fallot and cardiac valvular repair with sternal fixation hardware in place. Hyperinflated lungs without pneumonic consolidation, effusion or pneumothorax. No acute osseous abnormality. IMPRESSION: Stable cardiomegaly with aortic atherosclerosis. No acute pulmonary disease. Electronically Signed   By: Ashley Royalty M.D.   On: 08/07/2017 18:03   Dg Abd Portable 1 View  Result Date:  08/07/2017 CLINICAL DATA:  Dehydration. Patient feels weak and is unsure if she passed out. EXAM: PORTABLE ABDOMEN - 1 VIEW COMPARISON:  06/26/2016 CT FINDINGS: Scattered air containing small and large bowel loops in a nonobstructive nondistended bowel gas pattern. No radio-opaque calculi. Lower lumbar degenerative disc and facet arthropathy L4 through S1. Mild SI joint osteoarthritis with sclerosis. Both hip joints are maintained. No acute osseous abnormality. IMPRESSION: Unremarkable bowel gas pattern.  No radiopaque calculi. Electronically Signed   By: Ashley Royalty M.D.   On: 08/07/2017 17:58   US Abdomen Limited Ruq  Result Date: 08/08/2017 CLINICAL DATA:  Two weeks of abdominal pain EXAM: ULTRASOUND ABDOMEN LIMITED RIGHT UPPER QUADRANT COMPARISON:  Abdominopelvic CT scan of August 07, 2017 FINDINGS: Gallbladder: No gallstones or wall thickening visualized. No sonographic Murphy sign noted by sonographer. Common bile duct: Diameter: 2 mm Liver: No focal lesion identified. Within normal  limits in parenchymal echogenicity. Portal vein is patent on color Doppler imaging with normal direction of blood flow towards the liver. IMPRESSION: Normal right upper quadrant abdominal ultrasound. If gallbladder dysfunction is suspected clinically, a nuclear medicine hepatobiliary scan with gallbladder ejection fraction determination may be useful. Electronically Signed   By: David  Martinique M.D.   On: 08/08/2017 12:57    Subjective: Denies nausea, diarrhea, abdominal pain.  Ate eggs and cheese toast with milk for breakfast and feeling well.    Discharge Exam: Vitals:   08/09/17 2054 08/10/17 0500  BP: 118/65 117/70  Pulse: 73 68  Resp: 15 16  Temp: 97.7 F (36.5 C) 97.7 F (36.5 C)  SpO2: 100% 100%   Vitals:   08/09/17 0622 08/09/17 1450 08/09/17 2054 08/10/17 0500  BP: 129/78 95/63 118/65 117/70  Pulse: 72 72 73 68  Resp: 16 16 15 16   Temp: 97.7 F (36.5 C) 97.9 F (36.6 C) 97.7 F (36.5 C) 97.7 F  (36.5 C)  TempSrc: Oral Oral Oral Oral  SpO2: 100% 100% 100% 100%  Weight:      Height:        General exam:  Adult female.  No acute distress.  HEENT:  NCAT, MMM Respiratory system: Clear to auscultation bilaterally Cardiovascular system: Regular rate and rhythm, normal L9/F7. 2/6 systolic murmur.  Warm extremities Gastrointestinal system:  Normal active bowel sounds, soft, nondistended, nontender MSK:  Normal tone and bulk, no lower extremity edema Neuro:  Grossly intact Psych:  Childlike affect.     The results of significant diagnostics from this hospitalization (including imaging, microbiology, ancillary and laboratory) are listed below for reference.     Microbiology: Recent Results (from the past 240 hour(s))  Culture, blood (Routine x 2)     Status: None (Preliminary result)   Collection Time: 08/07/17  4:58 PM  Result Value Ref Range Status   Specimen Description LEFT ANTECUBITAL  Final   Special Requests   Final    BOTTLES DRAWN AEROBIC AND ANAEROBIC Blood Culture adequate volume   Culture NO GROWTH 3 DAYS  Final   Report Status PENDING  Incomplete  Culture, blood (Routine x 2)     Status: None (Preliminary result)   Collection Time: 08/07/17  6:04 PM  Result Value Ref Range Status   Specimen Description BLOOD LEFT HAND  Final   Special Requests   Final    BOTTLES DRAWN AEROBIC ONLY Blood Culture adequate volume   Culture NO GROWTH 3 DAYS  Final   Report Status PENDING  Incomplete  MRSA PCR Screening     Status: None   Collection Time: 08/08/17  9:10 AM  Result Value Ref Range Status   MRSA by PCR NEGATIVE NEGATIVE Final    Comment:        The GeneXpert MRSA Assay (FDA approved for NASAL specimens only), is one component of a comprehensive MRSA colonization surveillance program. It is not intended to diagnose MRSA infection nor to guide or monitor treatment for MRSA infections.   Gastrointestinal Panel by PCR , Stool     Status: None   Collection Time:  08/08/17  5:35 PM  Result Value Ref Range Status   Campylobacter species NOT DETECTED NOT DETECTED Final   Plesimonas shigelloides NOT DETECTED NOT DETECTED Final   Salmonella species NOT DETECTED NOT DETECTED Final   Yersinia enterocolitica NOT DETECTED NOT DETECTED Final   Vibrio species NOT DETECTED NOT DETECTED Final   Vibrio cholerae NOT DETECTED NOT DETECTED Final  Enteroaggregative E coli (EAEC) NOT DETECTED NOT DETECTED Final   Enteropathogenic E coli (EPEC) NOT DETECTED NOT DETECTED Final   Enterotoxigenic E coli (ETEC) NOT DETECTED NOT DETECTED Final   Shiga like toxin producing E coli (STEC) NOT DETECTED NOT DETECTED Final   Shigella/Enteroinvasive E coli (EIEC) NOT DETECTED NOT DETECTED Final   Cryptosporidium NOT DETECTED NOT DETECTED Final   Cyclospora cayetanensis NOT DETECTED NOT DETECTED Final   Entamoeba histolytica NOT DETECTED NOT DETECTED Final   Giardia lamblia NOT DETECTED NOT DETECTED Final   Adenovirus F40/41 NOT DETECTED NOT DETECTED Final   Astrovirus NOT DETECTED NOT DETECTED Final   Norovirus GI/GII NOT DETECTED NOT DETECTED Final   Rotavirus A NOT DETECTED NOT DETECTED Final   Sapovirus (I, II, IV, and V) NOT DETECTED NOT DETECTED Final     Labs: BNP (last 3 results) No results for input(s): BNP in the last 8760 hours. Basic Metabolic Panel:  Recent Labs Lab 08/07/17 1658 08/08/17 0505 08/09/17 0425  NA 131* 137 138  K 3.6 3.1* 3.6  CL 95* 106 109  CO2 26 24 25   GLUCOSE 100* 110* 95  BUN 90* 56* 24*  CREATININE 2.62* 1.46* 1.10*  CALCIUM 7.8* 7.3* 7.7*  MG  --  1.7  --    Liver Function Tests:  Recent Labs Lab 08/07/17 1658 08/08/17 0505  AST 18 16  ALT 13* 11*  ALKPHOS 54 38  BILITOT 0.6 0.6  PROT 6.4* 4.9*  ALBUMIN 3.3* 2.6*    Recent Labs Lab 08/07/17 2352  LIPASE 28   No results for input(s): AMMONIA in the last 168 hours. CBC:  Recent Labs Lab 08/07/17 1658 08/08/17 0505 08/09/17 0425  WBC 15.3* 9.5 6.2   NEUTROABS 13.6*  --   --   HGB 10.1* 9.2* 9.4*  HCT 29.9* 27.7* 27.7*  MCV 85.7 84.7 85.0  PLT 290 259 290   Cardiac Enzymes:  Recent Labs Lab 08/07/17 2352 08/08/17 0505  TROPONINI <0.03 <0.03   BNP: Invalid input(s): POCBNP CBG:  Recent Labs Lab 08/07/17 1643 08/08/17 0819 08/09/17 0823 08/10/17 0836  GLUCAP 79 85 94 77   D-Dimer No results for input(s): DDIMER in the last 72 hours. Hgb A1c No results for input(s): HGBA1C in the last 72 hours. Lipid Profile No results for input(s): CHOL, HDL, LDLCALC, TRIG, CHOLHDL, LDLDIRECT in the last 72 hours. Thyroid function studies  Recent Labs  08/09/17 0447  TSH 7.852*   Anemia work up  Recent Labs  08/09/17 0425 08/09/17 0447  VITAMINB12 670  --   FOLATE  --  27.0  FERRITIN 101  --   TIBC 249*  --   IRON 57  --    Urinalysis    Component Value Date/Time   COLORURINE STRAW (A) 08/07/2017 1803   APPEARANCEUR CLEAR 08/07/2017 1803   LABSPEC 1.005 08/07/2017 1803   PHURINE 7.0 08/07/2017 1803   GLUCOSEU NEGATIVE 08/07/2017 1803   GLUCOSEU NEGATIVE 07/30/2016 1638   HGBUR NEGATIVE 08/07/2017 1803   BILIRUBINUR NEGATIVE 08/07/2017 1803   BILIRUBINUR NEG 12/24/2012 1458   KETONESUR NEGATIVE 08/07/2017 1803   PROTEINUR NEGATIVE 08/07/2017 1803   UROBILINOGEN 0.2 07/30/2016 1638   NITRITE NEGATIVE 08/07/2017 1803   LEUKOCYTESUR NEGATIVE 08/07/2017 1803   Sepsis Labs Invalid input(s): PROCALCITONIN,  WBC,  LACTICIDVEN   Time coordinating discharge: Over 30 minutes  SIGNED:   Janece Canterbury, MD  [facility] 08/10/2017, 12:18 PM Pager   If 7PM-7AM, please contact night-coverage [email or web address] Password  [email or web address]

## 2017-08-12 ENCOUNTER — Ambulatory Visit (HOSPITAL_COMMUNITY): Payer: Medicare HMO

## 2017-08-12 LAB — CULTURE, BLOOD (ROUTINE X 2)
Culture: NO GROWTH
Culture: NO GROWTH
Special Requests: ADEQUATE
Special Requests: ADEQUATE

## 2017-08-14 ENCOUNTER — Ambulatory Visit (HOSPITAL_COMMUNITY): Payer: Medicare HMO

## 2017-08-14 ENCOUNTER — Other Ambulatory Visit (INDEPENDENT_AMBULATORY_CARE_PROVIDER_SITE_OTHER): Payer: Medicare HMO

## 2017-08-14 ENCOUNTER — Encounter: Payer: Self-pay | Admitting: Internal Medicine

## 2017-08-14 ENCOUNTER — Ambulatory Visit (INDEPENDENT_AMBULATORY_CARE_PROVIDER_SITE_OTHER): Payer: Medicare HMO | Admitting: Internal Medicine

## 2017-08-14 VITALS — BP 144/84 | HR 77 | Temp 97.8°F | Resp 16 | Ht 63.0 in | Wt 132.0 lb

## 2017-08-14 DIAGNOSIS — I10 Essential (primary) hypertension: Secondary | ICD-10-CM

## 2017-08-14 DIAGNOSIS — H43393 Other vitreous opacities, bilateral: Secondary | ICD-10-CM

## 2017-08-14 DIAGNOSIS — R600 Localized edema: Secondary | ICD-10-CM | POA: Diagnosis not present

## 2017-08-14 DIAGNOSIS — E860 Dehydration: Secondary | ICD-10-CM | POA: Diagnosis not present

## 2017-08-14 DIAGNOSIS — Z23 Encounter for immunization: Secondary | ICD-10-CM

## 2017-08-14 LAB — BASIC METABOLIC PANEL
BUN: 15 mg/dL (ref 6–23)
CO2: 26 meq/L (ref 19–32)
Calcium: 9.2 mg/dL (ref 8.4–10.5)
Chloride: 105 mEq/L (ref 96–112)
Creatinine, Ser: 1.07 mg/dL (ref 0.40–1.20)
GFR: 54.28 mL/min — AB (ref >60.00)
GLUCOSE: 73 mg/dL (ref 70–99)
POTASSIUM: 3.8 meq/L (ref 3.5–5.1)
SODIUM: 138 meq/L (ref 135–145)

## 2017-08-14 LAB — CBC WITH DIFFERENTIAL/PLATELET
Basophils Absolute: 0.1 10*3/uL (ref 0.0–0.1)
Basophils Relative: 0.8 % (ref 0.0–3.0)
EOS ABS: 0.3 10*3/uL (ref 0.0–0.7)
Eosinophils Relative: 2.9 % (ref 0.0–5.0)
HCT: 33 % — ABNORMAL LOW (ref 36.0–46.0)
HEMOGLOBIN: 10.7 g/dL — AB (ref 12.0–15.0)
Lymphocytes Relative: 20.4 % (ref 12.0–46.0)
Lymphs Abs: 2 10*3/uL (ref 0.7–4.0)
MCHC: 32.5 g/dL (ref 30.0–36.0)
MCV: 88.8 fl (ref 78.0–100.0)
MONO ABS: 0.7 10*3/uL (ref 0.1–1.0)
Monocytes Relative: 7.3 % (ref 3.0–12.0)
NEUTROS PCT: 68.6 % (ref 43.0–77.0)
Neutro Abs: 6.7 10*3/uL (ref 1.4–7.7)
Platelets: 310 10*3/uL (ref 150.0–400.0)
RBC: 3.71 Mil/uL — AB (ref 3.87–5.11)
RDW: 14.3 % (ref 11.5–15.5)
WBC: 9.7 10*3/uL (ref 4.0–10.5)

## 2017-08-14 MED ORDER — TORSEMIDE 10 MG PO TABS: 10.0000 mg | ORAL_TABLET | Freq: Every day | ORAL | 1 refills | Status: DC

## 2017-08-14 NOTE — Patient Instructions (Signed)

## 2017-08-14 NOTE — Progress Notes (Signed)
Subjective:  Patient ID: Michelle [name], female    DOB: [birth year]  Age: 66 y.o. MRN: [id number]  CC: Hypertension and Hypothyroidism   HPI NATOSHA BOU presents for f/up after a recent admission for severe dehydration. She may have had a viral illness but she said she was also overdoing it emotionally and physically. She has recovered and feels like she is back to her normal state. She does complain that the edema in her lower extremities is reaccumulating since she stopped taking the diuretic. She also complains of floaters in her visual field for the last year wants to see an ophthalmologist. She brings her medications in today and she is confused about what to take. She has a prescription for losartan/hydrochlorothiazide, telmisartan, and torsemide. She doesn't know which one she should be taking.  Outpatient Medications Prior to Visit  Medication Sig Dispense Refill  . amitriptyline (ELAVIL) 50 MG tablet Take 1 tablet (50 mg total) by mouth at bedtime. 90 tablet 3  . aspirin 81 MG tablet Take 81 mg by mouth daily.      . cholecalciferol (VITAMIN D) 1000 UNITS tablet Take 2,000 Units by mouth daily.    . Ginkgo Biloba 40 MG TABS Take 120 mg by mouth daily.    Marland Kitchen levothyroxine (SYNTHROID, LEVOTHROID) 50 MCG tablet TAKE 1 TABLET BY MOUTH EVERY DAY 90 tablet 1  . simvastatin (ZOCOR) 10 MG tablet Take 1 tablet by mouth daily.    . vitamin E 1000 UNIT capsule Take 1,000 Units by mouth daily.    Marland Kitchen acetaminophen (TYLENOL) 500 MG tablet Take by mouth every 6 (six) hours as needed.     . beta carotene 10000 UNIT capsule Take 10,000 Units by mouth daily.     . betamethasone valerate (VALISONE) 0.1 % cream Apply 1 application topically 2 (two) times daily.     . Desoximetasone (TOPICORT SPRAY EX) Apply 1 spray topically as needed.    . hydrOXYzine (ATARAX/VISTARIL) 10 MG tablet Take 10-30 mg by mouth every 4 (four) hours.  0  . Omega-3 Fatty Acids (FISH OIL) 1000 MG CAPS Take 1,000 mg by mouth  2 (two) times daily.     Marland Kitchen triamcinolone cream (KENALOG) 0.1 % Apply 1 application topically 2 (two) times daily.      No facility-administered medications prior to visit.     ROS Review of Systems  Constitutional: Negative for appetite change, diaphoresis and fatigue.  HENT: Negative.   Eyes: Positive for visual disturbance. Negative for pain and redness.  Respiratory: Negative for cough, chest tightness, shortness of breath and wheezing.   Cardiovascular: Positive for leg swelling. Negative for chest pain and palpitations.  Gastrointestinal: Negative for abdominal pain, constipation, diarrhea, nausea and vomiting.  Endocrine: Negative.   Genitourinary: Negative.  Negative for decreased urine volume, difficulty urinating, dysuria, frequency and urgency.  Musculoskeletal: Negative.  Negative for back pain and joint swelling.  Skin: Negative.   Allergic/Immunologic: Negative.   Neurological: Negative.  Negative for dizziness, weakness, light-headedness and numbness.  Hematological: Negative for adenopathy. Does not bruise/bleed easily.  Psychiatric/Behavioral: Negative.     Objective:  BP (!) 144/84 (BP Location: Left Arm, Patient Position: Sitting, Cuff Size: Normal)   Pulse 77   Temp 97.8 F (36.6 C) (Oral)   Resp 16   Ht 5\' 3"  (1.6 m)   Wt 132 lb (59.9 kg)   SpO2 98%   BMI 23.38 kg/m   BP Readings from Last 3 Encounters:  08/14/17 (!) 144/84  08/10/17 117/70  08/07/17 (!) 70/50    Wt Readings from Last 3 Encounters:  08/14/17 132 lb (59.9 kg)  08/07/17 130 lb 9.6 oz (59.2 kg)  08/07/17 123 lb 12 oz (56.1 kg)    Physical Exam  Constitutional: She is oriented to person, place, and time. No distress.  HENT:  Mouth/Throat: No oropharyngeal exudate.  Eyes: Conjunctivae are normal. Right eye exhibits no discharge. No scleral icterus.  Neck: Normal range of motion. Neck supple. No JVD present. No thyromegaly present.  Cardiovascular: Normal rate and regular rhythm.   Exam reveals no gallop and no friction rub.   No murmur heard. Pulmonary/Chest: Effort normal and breath sounds normal. No respiratory distress. She has no wheezes. She has no rales. She exhibits no tenderness.  Abdominal: Soft. Bowel sounds are normal. She exhibits no distension and no mass. There is no tenderness. There is no rebound and no guarding.  Musculoskeletal: She exhibits edema (1+ pitting edema in BLE). She exhibits no tenderness or deformity.  Lymphadenopathy:    She has no cervical adenopathy.  Neurological: She is alert and oriented to person, place, and time.  Skin: Skin is warm and dry. No rash noted. She is not diaphoretic. No erythema. No pallor.  Vitals reviewed.   Lab Results  Component Value Date   WBC 9.7 08/14/2017   HGB 10.7 (L) 08/14/2017   HCT 33.0 (L) 08/14/2017   PLT 310.0 08/14/2017   GLUCOSE 73 08/14/2017   CHOL 190 07/30/2016   TRIG 119.0 07/30/2016   HDL 48.70 07/30/2016   LDLDIRECT 113 (H) 06/16/2008   LDLCALC 118 (H) 07/30/2016   ALT 11 (L) 08/08/2017   AST 16 08/08/2017   NA 138 08/14/2017   K 3.8 08/14/2017   CL 105 08/14/2017   CREATININE 1.07 08/14/2017   BUN 15 08/14/2017   CO2 26 08/14/2017   TSH 7.852 (H) 08/09/2017   INR 1.10 08/07/2017    Ct Abdomen Pelvis Wo Contrast  Result Date: 08/07/2017 CLINICAL DATA:  Hypotensive.  Abdominal pain. EXAM: CT ABDOMEN AND PELVIS WITHOUT CONTRAST TECHNIQUE: Multidetector CT imaging of the abdomen and pelvis was performed following the standard protocol without IV contrast. Oral contrast administered. COMPARISON:  Abdominal radiograph August 07, 2017 at 1719 hours and CT abdomen and pelvis June 26, 2016 FINDINGS: LOWER CHEST: Minimal RIGHT lower lobe tree-in-bud infiltrates. Bibasilar atelectasis/scarring. Heart size is normal, status post tricuspid valve replacement. No pericardial effusion. HEPATOBILIARY: Normal. PANCREAS: Normal. SPLEEN: Normal. ADRENALS/URINARY TRACT: Kidneys are orthotopic,  demonstrating normal size and morphology. LEFT lower pole scarring. No nephrolithiasis, hydronephrosis; limited assessment for renal masses on this nonenhanced examination. The unopacified ureters are normal in course and caliber. Urinary bladder is well distended and unremarkable. Normal adrenal glands. STOMACH/BOWEL: Small hiatal hernia. The stomach, small and large bowel are normal in course and caliber without inflammatory changes. Moderate sigmoid colon diverticulosis. Narrowed colon RIGHT upper quadrant compatible with peristalsis, no discrete mass. Normal appendix. VASCULAR/LYMPHATIC: Aortoiliac vessels are normal in course and caliber. Moderate calcific atherosclerosis No lymphadenopathy by CT size criteria. REPRODUCTIVE: Normal. OTHER: No intraperitoneal free fluid or free air. MUSCULOSKELETAL: Non-acute. Grade 1 L4-5 anterolisthesis without spondylolysis. Moderate lower lumbar facet arthropathy. Small fat containing umbilical hernia. IMPRESSION: 1. No acute intra-abdominal/pelvic process. 2. Moderate sigmoid diverticulosis. 3. Minimal RIGHT lower lobe tree-in-bud infiltrates may be infectious or inflammatory. Aortic Atherosclerosis (ICD10-I70.0). Electronically Signed   By: Elon Alas M.D.   On: 08/07/2017 21:45   Dg Chest Portable 1 View  Result  Date: 08/07/2017 CLINICAL DATA:  Chest pain and hypoxia EXAM: PORTABLE CHEST 1 VIEW COMPARISON:  08/09/2016 MRI and CXR 06/26/2016 FINDINGS: Stable cardiomegaly with aortic atherosclerosis. Status post tetralogy of Fallot and cardiac valvular repair with sternal fixation hardware in place. Hyperinflated lungs without pneumonic consolidation, effusion or pneumothorax. No acute osseous abnormality. IMPRESSION: Stable cardiomegaly with aortic atherosclerosis. No acute pulmonary disease. Electronically Signed   By: Ashley Royalty M.D.   On: 08/07/2017 18:03   Dg Abd Portable 1 View  Result Date: 08/07/2017 CLINICAL DATA:  Dehydration. Patient feels weak  and is unsure if she passed out. EXAM: PORTABLE ABDOMEN - 1 VIEW COMPARISON:  06/26/2016 CT FINDINGS: Scattered air containing small and large bowel loops in a nonobstructive nondistended bowel gas pattern. No radio-opaque calculi. Lower lumbar degenerative disc and facet arthropathy L4 through S1. Mild SI joint osteoarthritis with sclerosis. Both hip joints are maintained. No acute osseous abnormality. IMPRESSION: Unremarkable bowel gas pattern.  No radiopaque calculi. Electronically Signed   By: Ashley Royalty M.D.   On: 08/07/2017 17:58   US Abdomen Limited Ruq  Result Date: 08/08/2017 CLINICAL DATA:  Two weeks of abdominal pain EXAM: ULTRASOUND ABDOMEN LIMITED RIGHT UPPER QUADRANT COMPARISON:  Abdominopelvic CT scan of August 07, 2017 FINDINGS: Gallbladder: No gallstones or wall thickening visualized. No sonographic Murphy sign noted by sonographer. Common bile duct: Diameter: 2 mm Liver: No focal lesion identified. Within normal limits in parenchymal echogenicity. Portal vein is patent on color Doppler imaging with normal direction of blood flow towards the liver. IMPRESSION: Normal right upper quadrant abdominal ultrasound. If gallbladder dysfunction is suspected clinically, a nuclear medicine hepatobiliary scan with gallbladder ejection fraction determination may be useful. Electronically Signed   By: David  Martinique M.D.   On: 08/08/2017 12:57    Assessment & Plan:   Rivkah was seen today for hypertension and hypothyroidism.  Diagnoses and all orders for this visit:  Dehydration, severe- this has resolved. Her lites and renal function are normal now. I have asked her to let home health care nurse come help her organize her medications to improve compliance. -     Basic metabolic panel; Future -     Ambulatory referral to [place] hypertension- her blood pressure is mildly elevated. We will restart the loop diuretic. -     CBC with Differential/Platelet; Future -     Basic metabolic  panel; Future -     torsemide (DEMADEX) 10 MG tablet; Take 1 tablet (10 mg total) by mouth daily.  Localized edema- we will restart the loop diuretic but at a low dose. -     torsemide (DEMADEX) 10 MG tablet; Take 1 tablet (10 mg total) by mouth daily.  Need for influenza vaccination -     Flu vaccine HIGH DOSE PF (Fluzone High dose)  Floaters in visual field, bilateral -     Ambulatory referral to Ophthalmology   I have discontinued Ms. Mcqueary's Fish Oil, triamcinolone cream, acetaminophen, beta carotene, betamethasone valerate, Desoximetasone (TOPICORT SPRAY EX), and hydrOXYzine. I am also having her start on torsemide. Additionally, I am having her maintain her aspirin, cholecalciferol, vitamin E, amitriptyline, simvastatin, Ginkgo Biloba, and levothyroxine.  Meds ordered this encounter  Medications  . torsemide (DEMADEX) 10 MG tablet    Sig: Take 1 tablet (10 mg total) by mouth daily.    Dispense:  90 tablet    Refill:  1     Follow-up: Return in about 3 months (around 11/14/2017).  Marcello Moores  Ronnald Ramp, MD

## 2017-08-16 ENCOUNTER — Ambulatory Visit (HOSPITAL_COMMUNITY): Payer: Medicare HMO

## 2017-08-16 DIAGNOSIS — H43393 Other vitreous opacities, bilateral: Secondary | ICD-10-CM | POA: Insufficient documentation

## 2017-08-21 ENCOUNTER — Ambulatory Visit (HOSPITAL_COMMUNITY): Payer: Medicare HMO

## 2017-08-21 ENCOUNTER — Other Ambulatory Visit: Payer: Self-pay | Admitting: Cardiovascular Disease

## 2017-08-22 ENCOUNTER — Ambulatory Visit
Admission: RE | Admit: 2017-08-22 | Discharge: 2017-08-22 | Disposition: A | Discharge status: Home / Self Care | Payer: Medicare HMO | Source: Ambulatory Visit | Attending: Neurology | Admitting: Neurology

## 2017-08-22 DIAGNOSIS — R51 Headache: Secondary | ICD-10-CM | POA: Diagnosis not present

## 2017-08-22 DIAGNOSIS — R519 Headache, unspecified: Secondary | ICD-10-CM

## 2017-08-23 ENCOUNTER — Ambulatory Visit (HOSPITAL_COMMUNITY): Payer: Medicare HMO

## 2017-08-26 ENCOUNTER — Ambulatory Visit (HOSPITAL_COMMUNITY): Payer: Medicare HMO

## 2017-08-27 ENCOUNTER — Telehealth: Payer: Self-pay

## 2017-08-27 NOTE — Telephone Encounter (Signed)
Spoke with pt, relaying message below.  She states that she is still having headaches.  Her memory is still giving her problems and she thinks that trying to remember something triggers a headache (?).

## 2017-08-27 NOTE — Telephone Encounter (Signed)
-----   Message from Cameron Sprang, MD sent at 08/26/2017  9:49 AM EDT ----- Pls let her know MRI brain did not show any evidence of tumor or stroke. It shows age-related changes and changes seen in patient with blood pressure issues. Continue control of blood pressure. Thanks

## 2017-08-28 ENCOUNTER — Ambulatory Visit (HOSPITAL_COMMUNITY): Payer: Medicare HMO

## 2017-09-05 ENCOUNTER — Ambulatory Visit (INDEPENDENT_AMBULATORY_CARE_PROVIDER_SITE_OTHER): Payer: Medicare HMO | Admitting: Nurse Practitioner

## 2017-09-05 ENCOUNTER — Ambulatory Visit: Payer: Medicare HMO | Admitting: Internal Medicine

## 2017-09-05 ENCOUNTER — Encounter: Payer: Self-pay | Admitting: Nurse Practitioner

## 2017-09-05 VITALS — BP 124/80 | HR 76 | Temp 97.7°F | Ht 63.0 in | Wt 127.0 lb

## 2017-09-05 DIAGNOSIS — J060 Acute laryngopharyngitis: Secondary | ICD-10-CM | POA: Diagnosis not present

## 2017-09-05 LAB — POCT RAPID STREP A (OFFICE): Rapid Strep A Screen: NEGATIVE

## 2017-09-05 NOTE — Progress Notes (Signed)
Subjective:  Patient ID: Michelle [name], female    DOB: [birth year]  Age: 66 y.o. MRN: [id number]  CC: Sore Throat (sore throat,little coughing--going on for 3 days. lossing voice)   Sore Throat   This is a new problem. The current episode started in the past 7 days. The problem has been unchanged. There has been no fever. Associated symptoms include coughing, a hoarse voice and swollen glands. Pertinent negatives include no abdominal pain, congestion, drooling, ear discharge, ear pain, shortness of breath, stridor or trouble swallowing. She has had no exposure to strep or mono. She has tried nothing for the symptoms.   Outpatient Medications Prior to Visit  Medication Sig Dispense Refill  . amitriptyline (ELAVIL) 50 MG tablet Take 1 tablet (50 mg total) by mouth at bedtime. 90 tablet 3  . aspirin 81 MG tablet Take 81 mg by mouth daily.      . cholecalciferol (VITAMIN D) 1000 UNITS tablet Take 2,000 Units by mouth daily.    . Ginkgo Biloba 40 MG TABS Take 120 mg by mouth daily.    Marland Kitchen levothyroxine (SYNTHROID, LEVOTHROID) 50 MCG tablet TAKE 1 TABLET BY MOUTH EVERY DAY 90 tablet 1  . vitamin E 1000 UNIT capsule Take 1,000 Units by mouth daily.    . simvastatin (ZOCOR) 10 MG tablet Take 1 tablet by mouth daily.    Marland Kitchen torsemide (DEMADEX) 10 MG tablet Take 1 tablet (10 mg total) by mouth daily. (Patient not taking: Reported on 09/05/2017) 90 tablet 1   No facility-administered medications prior to visit.     ROS See HPI  Objective:  BP 124/80   Pulse 76   Temp 97.7 F (36.5 C)   Ht 5\' 3"  (1.6 m)   Wt 127 lb (57.6 kg)   SpO2 98%   BMI 22.50 kg/m   BP Readings from Last 3 Encounters:  09/05/17 124/80  08/14/17 (!) 144/84  08/10/17 117/70    Wt Readings from Last 3 Encounters:  09/05/17 127 lb (57.6 kg)  08/14/17 132 lb (59.9 kg)  08/07/17 130 lb 9.6 oz (59.2 kg)    Physical Exam  Constitutional: She is oriented to person, place, and time.  HENT:  Right Ear: Tympanic  membrane, external ear and ear canal normal.  Left Ear: Tympanic membrane, external ear and ear canal normal.  Nose: Nose normal. No mucosal edema or rhinorrhea. Right sinus exhibits no maxillary sinus tenderness and no frontal sinus tenderness. Left sinus exhibits no maxillary sinus tenderness and no frontal sinus tenderness.  Mouth/Throat: Uvula is midline. No trismus in the jaw. No oropharyngeal exudate, posterior oropharyngeal edema or posterior oropharyngeal erythema.  Eyes: No scleral icterus.  Neck: Normal range of motion. Neck supple.  Cardiovascular: Normal rate and normal heart sounds.   Pulmonary/Chest: Effort normal and breath sounds normal.  Musculoskeletal: She exhibits no edema.  Lymphadenopathy:    She has cervical adenopathy.  Neurological: She is alert and oriented to person, place, and time.  Skin: Skin is warm and dry. No rash noted.  Psychiatric: She has a normal mood and affect. Her behavior is normal.  Vitals reviewed.   Lab Results  Component Value Date   WBC 9.7 08/14/2017   HGB 10.7 (L) 08/14/2017   HCT 33.0 (L) 08/14/2017   PLT 310.0 08/14/2017   GLUCOSE 73 08/14/2017   CHOL 190 07/30/2016   TRIG 119.0 07/30/2016   HDL 48.70 07/30/2016   LDLDIRECT 113 (H) 06/16/2008   LDLCALC 118 (H) 07/30/2016  ALT 11 (L) 08/08/2017   AST 16 08/08/2017   NA 138 08/14/2017   K 3.8 08/14/2017   CL 105 08/14/2017   CREATININE 1.07 08/14/2017   BUN 15 08/14/2017   CO2 26 08/14/2017   TSH 7.852 (H) 08/09/2017   INR 1.10 08/07/2017    Mr Brain Wo Contrast  Result Date: 08/22/2017 CLINICAL DATA:  Chronic daily headache EXAM: MRI HEAD WITHOUT CONTRAST TECHNIQUE: Multiplanar, multiecho pulse sequences of the brain and surrounding structures were obtained without intravenous contrast. COMPARISON:  CT head 06/26/2016 FINDINGS: Brain: Ventricles mildly prominent sized, stable from the prior CT. No significant cerebral atrophy. Third and fourth ventricles not significantly  dilated. Scattered subcortical white matter hyperintensities bilaterally. No acute infarct. Negative for mass. Chronic hemorrhage in the cerebellum bilaterally, and in the occipital lobes bilaterally. Chronic microhemorrhage in the deep white matter on the left. This may be due to poorly controlled hypertension. Vascular: Normal arterial flow void Skull and upper cervical spine: Negative Sinuses/Orbits: Negative Other: None IMPRESSION: Small hyperintensities in the subcortical white matter bilaterally most likely chronic microvascular ischemia. Scattered areas of chronic microhemorrhage likely due to hypertension. Amyloid also possible. Mild ventricular enlargement stable from the prior study. This appears to have gradually progressed since 2007. Electronically Signed   By: Franchot Gallo M.D.   On: 08/22/2017 15:11    Assessment & Plan:   Shaliah was seen today for sore throat.  Diagnoses and all orders for this visit:  Sore throat and laryngitis -     POCT rapid strep A   I am having Ms. Mctier maintain her aspirin, cholecalciferol, vitamin E, amitriptyline, simvastatin, Ginkgo Biloba, levothyroxine, torsemide, multivitamin, and acetaminophen.  Meds ordered this encounter  Medications  . Multiple Vitamin (MULTIVITAMIN) tablet    Sig: Take 1 tablet by mouth daily.  Marland Kitchen acetaminophen (TYLENOL) 500 MG tablet    Sig: Take 500 mg by mouth every 6 (six) hours as needed.    Follow-up: Return if symptoms worsen or fail to improve.  Wilfred Lacy, NP

## 2017-09-05 NOTE — Patient Instructions (Addendum)
[facility] [phone])   [facility] ([phone])  Pharyngitis Pharyngitis is a sore throat (pharynx). There is redness, pain, and swelling of your throat. Follow these instructions at home:  Drink enough fluids to keep your pee (urine) clear or pale yellow.  Only take medicine as told by your doctor. ? You may get sick again if you do not take medicine as told. Finish your medicines, even if you start to feel better. ? Do not take aspirin.  Rest.  Rinse your mouth (gargle) with salt water ( tsp of salt per 1 qt of water) every 1-2 hours. This will help the pain.  If you are not at risk for choking, you can suck on hard candy or sore throat lozenges. Contact a doctor if:  You have large, tender lumps on your neck.  You have a rash.  You cough up green, yellow-brown, or bloody spit. Get help right away if:  You have a stiff neck.  You drool or cannot swallow liquids.  You throw up (vomit) or are not able to keep medicine or liquids down.  You have very bad pain that does not go away with medicine.  You have problems breathing (not from a stuffy nose). This information is not intended to replace advice given to you by your health care provider. Make sure you discuss any questions you have with your health care provider. Document Released: 05/21/2008 Document Revised: 05/10/2016 Document Reviewed: 08/10/2013 Elsevier Interactive Patient Education  2017 [employer].

## 2017-09-18 ENCOUNTER — Ambulatory Visit: Payer: Medicare HMO | Admitting: Neurology

## 2017-09-27 ENCOUNTER — Encounter: Payer: Self-pay | Admitting: Internal Medicine

## 2017-10-27 ENCOUNTER — Other Ambulatory Visit: Payer: Self-pay | Admitting: Internal Medicine

## 2017-10-27 DIAGNOSIS — R600 Localized edema: Secondary | ICD-10-CM

## 2017-10-28 ENCOUNTER — Encounter: Payer: Self-pay | Admitting: Internal Medicine

## 2017-10-28 ENCOUNTER — Other Ambulatory Visit: Payer: Self-pay | Admitting: Internal Medicine

## 2017-10-28 ENCOUNTER — Ambulatory Visit (INDEPENDENT_AMBULATORY_CARE_PROVIDER_SITE_OTHER): Payer: Medicare HMO | Admitting: Internal Medicine

## 2017-10-28 VITALS — BP 142/84 | HR 73 | Temp 98.2°F | Resp 16 | Wt 124.0 lb

## 2017-10-28 DIAGNOSIS — E039 Hypothyroidism, unspecified: Secondary | ICD-10-CM

## 2017-10-28 DIAGNOSIS — I10 Essential (primary) hypertension: Secondary | ICD-10-CM | POA: Diagnosis not present

## 2017-10-28 DIAGNOSIS — F320 Major depressive disorder, single episode, mild: Secondary | ICD-10-CM | POA: Insufficient documentation

## 2017-10-28 DIAGNOSIS — E785 Hyperlipidemia, unspecified: Secondary | ICD-10-CM | POA: Diagnosis not present

## 2017-10-28 DIAGNOSIS — R69 Illness, unspecified: Secondary | ICD-10-CM | POA: Diagnosis not present

## 2017-10-28 DIAGNOSIS — Q249 Congenital malformation of heart, unspecified: Secondary | ICD-10-CM | POA: Diagnosis not present

## 2017-10-28 MED ORDER — ESCITALOPRAM OXALATE 10 MG PO TABS: 10.0000 mg | ORAL_TABLET | Freq: Every day | ORAL | 1 refills | Status: DC

## 2017-10-28 NOTE — Progress Notes (Signed)
Subjective:  Patient ID: Michelle [name], female    DOB: Jan 12, 1951  Age: 66 y.o. MRN: [id number]  CC: Anemia; Hypertension; Hyperlipidemia; Hypothyroidism; and Depression   HPI Michelle [name] presents for f/up - she complains of anxiety, depression, and frustration.  Tells me the edema in her lower extremities has improved.  She does not know if she is taking her medications properly.  She gets confused about her medical regimen.  Does not know if she is taking her thyroid supplement.  She otherwise feels well and offers no other complaints.  Outpatient Medications Prior to Visit  Medication Sig Dispense Refill  . acetaminophen (TYLENOL) 500 MG tablet Take 500 mg by mouth every 6 (six) hours as needed.    Marland Kitchen amitriptyline (ELAVIL) 50 MG tablet Take 1 tablet (50 mg total) by mouth at bedtime. 90 tablet 3  . aspirin 81 MG tablet Take 81 mg by mouth daily.      . cholecalciferol (VITAMIN D) 1000 UNITS tablet Take 2,000 Units by mouth daily.    . Ginkgo Biloba 40 MG TABS Take 120 mg by mouth daily.    Marland Kitchen levothyroxine (SYNTHROID, LEVOTHROID) 50 MCG tablet TAKE 1 TABLET BY MOUTH EVERY DAY 90 tablet 1  . Multiple Vitamin (MULTIVITAMIN) tablet Take 1 tablet by mouth daily.    . vitamin E 1000 UNIT capsule Take 1,000 Units by mouth daily.    . simvastatin (ZOCOR) 10 MG tablet Take 1 tablet by mouth daily.    Marland Kitchen torsemide (DEMADEX) 10 MG tablet TAKE 1 TABLET (10 MG TOTAL) BY MOUTH DAILY. (Patient not taking: Reported on 10/28/2017) 90 tablet 1  . torsemide (DEMADEX) 10 MG tablet Take 1 tablet (10 mg total) by mouth daily. (Patient not taking: Reported on 09/05/2017) 90 tablet 1   No facility-administered medications prior to visit.     ROS Review of Systems  Constitutional: Negative for appetite change, chills, fatigue and unexpected weight change.  HENT: Negative.   Eyes: Negative for visual disturbance.  Respiratory: Negative for cough, chest tightness, shortness of breath and wheezing.     Cardiovascular: Positive for leg swelling. Negative for chest pain and palpitations.  Gastrointestinal: Negative for abdominal pain, constipation, diarrhea, nausea and vomiting.  Endocrine: Negative.   Genitourinary: Negative.  Negative for difficulty urinating.  Musculoskeletal: Negative.  Negative for arthralgias, back pain and myalgias.  Skin: Negative.  Negative for color change and rash.  Neurological: Negative.  Negative for dizziness.  Hematological: Negative for adenopathy. Does not bruise/bleed easily.  Psychiatric/Behavioral: Positive for confusion, decreased concentration, dysphoric mood and sleep disturbance. Negative for agitation, behavioral problems, hallucinations, self-injury and suicidal ideas. The patient is nervous/anxious. The patient is not hyperactive.     Objective:  BP (!) 142/84   Pulse 73   Temp 98.2 F (36.8 C) (Oral)   Resp 16   Wt 124 lb (56.2 kg)   SpO2 98%   BMI 21.97 kg/m   BP Readings from Last 3 Encounters:  10/28/17 (!) 142/84  09/05/17 124/80  08/14/17 (!) 144/84    Wt Readings from Last 3 Encounters:  10/28/17 124 lb (56.2 kg)  09/05/17 127 lb (57.6 kg)  08/14/17 132 lb (59.9 kg)    Physical Exam  Constitutional: She is oriented to person, place, and time. No distress.  HENT:  Mouth/Throat: Oropharynx is clear and moist. No oropharyngeal exudate.  Eyes: Conjunctivae are normal. No scleral icterus.  Neck: Normal range of motion. Neck supple. No JVD present. No thyromegaly  present.  Cardiovascular: Normal rate and regular rhythm. Exam reveals no gallop and no friction rub.  No murmur heard. Pulmonary/Chest: Effort normal and breath sounds normal. No respiratory distress. She has no wheezes. She has no rales. She exhibits no tenderness.  Abdominal: Soft. Bowel sounds are normal. She exhibits no distension and no mass. There is no tenderness. There is no rebound and no guarding.  Musculoskeletal: Normal range of motion. She exhibits  edema (trace edema BLE). She exhibits no tenderness.  Lymphadenopathy:    She has no cervical adenopathy.  Neurological: She is alert and oriented to person, place, and time.  Skin: Skin is warm and dry. Rash noted. She is not diaphoretic.  Psychiatric: Judgment normal. Her mood appears not anxious. Her speech is not rapid and/or pressured, not delayed and not tangential. She is slowed. She is not withdrawn. Cognition and memory are normal. She exhibits a depressed mood. She expresses no suicidal ideation. She expresses no suicidal plans and no homicidal plans. She is attentive.  Vitals reviewed.   Lab Results  Component Value Date   WBC 9.7 08/14/2017   HGB 10.7 (L) 08/14/2017   HCT 33.0 (L) 08/14/2017   PLT 310.0 08/14/2017   GLUCOSE 73 08/14/2017   CHOL 190 07/30/2016   TRIG 119.0 07/30/2016   HDL 48.70 07/30/2016   LDLDIRECT 113 (H) 06/16/2008   LDLCALC 118 (H) 07/30/2016   ALT 11 (L) 08/08/2017   AST 16 08/08/2017   NA 138 08/14/2017   K 3.8 08/14/2017   CL 105 08/14/2017   CREATININE 1.07 08/14/2017   BUN 15 08/14/2017   CO2 26 08/14/2017   TSH 7.852 (H) 08/09/2017   INR 1.10 08/07/2017    Mr Brain Wo Contrast  Result Date: 08/22/2017 CLINICAL DATA:  Chronic daily headache EXAM: MRI HEAD WITHOUT CONTRAST TECHNIQUE: Multiplanar, multiecho pulse sequences of the brain and surrounding structures were obtained without intravenous contrast. COMPARISON:  CT head 06/26/2016 FINDINGS: Brain: Ventricles mildly prominent sized, stable from the prior CT. No significant cerebral atrophy. Third and fourth ventricles not significantly dilated. Scattered subcortical white matter hyperintensities bilaterally. No acute infarct. Negative for mass. Chronic hemorrhage in the cerebellum bilaterally, and in the occipital lobes bilaterally. Chronic microhemorrhage in the deep white matter on the left. This may be due to poorly controlled hypertension. Vascular: Normal arterial flow void Skull and  upper cervical spine: Negative Sinuses/Orbits: Negative Other: None IMPRESSION: Small hyperintensities in the subcortical white matter bilaterally most likely chronic microvascular ischemia. Scattered areas of chronic microhemorrhage likely due to hypertension. Amyloid also possible. Mild ventricular enlargement stable from the prior study. This appears to have gradually progressed since 2007. Electronically Signed   By: Franchot Gallo M.D.   On: 08/22/2017 15:11    Assessment & Plan:   Jenine was seen today for anemia, hypertension, hyperlipidemia, hypothyroidism and depression.  Diagnoses and all orders for this visit:  Acquired hypothyroidism- Will recheck her TSH.  I am concerned she may not be taking levothyroxine so I have asked home health care company to help her with her medical compliance. -     TSH; Future  Essential hypertension- Her blood pressure is adequately well controlled.  I will monitor electrolytes and renal function. -     Comprehensive metabolic panel; Future -     CBC with Differential/Platelet; Future -     Ambulatory referral to Home Health  Hyperlipidemia LDL goal <100- she is due for her annual lipids. -     Lipid  panel; Future  Current mild episode of major depressive disorder without prior episode ([place])- Will start Lexapro.  Will increase the dose over the next few months as tolerated and indicated. -     escitalopram (LEXAPRO) 10 MG tablet; Take 1 tablet (10 mg total) at bedtime by mouth.  Congenital heart disease in adult -     Ambulatory referral to [facility]   I am having Bonnita Nasuti I. [place] start on escitalopram. I am also having her maintain her aspirin, cholecalciferol, vitamin E, amitriptyline, simvastatin, Ginkgo Biloba, levothyroxine, multivitamin, acetaminophen, and torsemide.  Meds ordered this encounter  Medications  . escitalopram (LEXAPRO) 10 MG tablet    Sig: Take 1 tablet (10 mg total) at bedtime by mouth.    Dispense:  90 tablet     Refill:  1     Follow-up: Return in about 3 months (around 01/28/2018).  [name], MD

## 2017-10-28 NOTE — Patient Instructions (Signed)
Major Depressive Disorder, Adult Major depressive disorder (MDD) is a mental health condition. It may also be called clinical depression or unipolar depression. MDD usually causes feelings of sadness, hopelessness, or helplessness. MDD can also cause physical symptoms. It can interfere with work, school, relationships, and other everyday activities. MDD may be mild, moderate, or severe. It may occur once (single episode major depressive disorder) or it may occur multiple times (recurrent major depressive disorder). What are the causes? The exact cause of this condition is not known. MDD is most likely caused by a combination of things, which may include:  Genetic factors. These are traits that are passed along from parent to child.  Individual factors. Your personality, your behavior, and the way you handle your thoughts and feelings may contribute to MDD. This includes personality traits and behaviors learned from others.  Physical factors, such as: ? Differences in the part of your brain that controls emotion. This part of your brain may be different than it is in people who do not have MDD. ? Long-term (chronic) medical or psychiatric illnesses.  Social factors. Traumatic experiences or major life changes may play a role in the development of MDD.  What increases the risk? This condition is more likely to develop in women. The following factors may also make you more likely to develop MDD:  A family history of depression.  Troubled family relationships.  Abnormally low levels of certain brain chemicals.  Traumatic events in childhood, especially abuse or the loss of a parent.  Being under a lot of stress, or long-term stress, especially from upsetting life experiences or losses.  A history of: ? Chronic physical illness. ? Other mental health disorders. ? Substance abuse.  Poor living conditions.  Experiencing social exclusion or discrimination on a regular basis.  What are  the signs or symptoms? The main symptoms of MDD typically include:  Constant depressed or irritable mood.  Loss of interest in things and activities.  MDD symptoms may also include:  Sleeping or eating too much or too little.  Unexplained weight change.  Fatigue or low energy.  Feelings of worthlessness or guilt.  Difficulty thinking clearly or making decisions.  Thoughts of suicide or of harming others.  Physical agitation or weakness.  Isolation.  Severe cases of MDD may also occur with other symptoms, such as:  Delusions or hallucinations, in which you imagine things that are not real (psychotic depression).  Low-level depression that lasts at least a year (chronic depression or persistent depressive disorder).  Extreme sadness and hopelessness (melancholic depression).  Trouble speaking and moving (catatonic depression).  How is this diagnosed? This condition may be diagnosed based on:  Your symptoms.  Your medical history, including your mental health history. This may involve tests to evaluate your mental health. You may be asked questions about your lifestyle, including any drug and alcohol use, and how long you have had symptoms of MDD.  A physical exam.  Blood tests to rule out other conditions.  You must have a depressed mood and at least four other MDD symptoms most of the day, nearly every day in the same 2-week timeframe before your health care provider can confirm a diagnosis of MDD. How is this treated? This condition is usually treated by mental health professionals, such as psychologists, psychiatrists, and clinical social workers. You may need more than one type of treatment. Treatment may include:  Psychotherapy. This is also called talk therapy or counseling. Types of psychotherapy include: ? Cognitive behavioral   therapy (CBT). This type of therapy teaches you to recognize unhealthy feelings, thoughts, and behaviors, and replace them with  positive thoughts and actions. ? Interpersonal therapy (IPT). This helps you to improve the way you relate to and communicate with others. ? Family therapy. This treatment includes members of your family.  Medicine to treat anxiety and depression, or to help you control certain emotions and behaviors.  Lifestyle changes, such as: ? Limiting alcohol and drug use. ? Exercising regularly. ? Getting plenty of sleep. ? Making healthy eating choices. ? Spending more time outdoors.  Treatments involving stimulation of the brain can be used in situations with extremely severe symptoms, or when medicine or other therapies do not work over time. These treatments include electroconvulsive therapy, transcranial magnetic stimulation, and vagal nerve stimulation. Follow these instructions at home: Activity  Return to your normal activities as told by your health care provider.  Exercise regularly and spend time outdoors as told by your health care provider. General instructions  Take over-the-counter and prescription medicines only as told by your health care provider.  Do not drink alcohol. If you drink alcohol, limit your alcohol intake to no more than 1 drink a day for nonpregnant women and 2 drinks a day for men. One drink equals 12 oz of beer, 5 oz of wine, or 1 oz of hard liquor. Alcohol can affect any antidepressant medicines you are taking. Talk to your health care provider about your alcohol use.  Eat a healthy diet and get plenty of sleep.  Find activities that you enjoy doing, and make time to do them.  Consider joining a support group. Your health care provider may be able to recommend a support group.  Keep all follow-up visits as told by your health care provider. This is important. Where to find more information: National Alliance on Mental Illness  www.nami.org  U.[place]. National Institute of Mental Health  www.nimh.nih.gov  National Suicide Prevention  Lifeline  1-800-273-TALK (8255). This is free, 24-hour help.  Contact a health care provider if:  Your symptoms get worse.  You develop new symptoms. Get help right away if:  You self-harm.  You have serious thoughts about hurting yourself or others.  You see, hear, taste, smell, or feel things that are not present (hallucinate). This information is not intended to replace advice given to you by your health care provider. Make sure you discuss any questions you have with your health care provider. Document Released: 03/30/2013 Document Revised: 08/09/2016 Document Reviewed: 06/13/2016 Elsevier Interactive Patient Education  2017 [employer]  

## 2017-11-04 DIAGNOSIS — H5203 Hypermetropia, bilateral: Secondary | ICD-10-CM | POA: Diagnosis not present

## 2017-11-05 ENCOUNTER — Telehealth: Payer: Self-pay | Admitting: Internal Medicine

## 2017-11-05 DIAGNOSIS — Q249 Congenital malformation of heart, unspecified: Secondary | ICD-10-CM | POA: Diagnosis not present

## 2017-11-05 DIAGNOSIS — M810 Age-related osteoporosis without current pathological fracture: Secondary | ICD-10-CM | POA: Diagnosis not present

## 2017-11-05 DIAGNOSIS — I10 Essential (primary) hypertension: Secondary | ICD-10-CM | POA: Diagnosis not present

## 2017-11-05 DIAGNOSIS — R69 Illness, unspecified: Secondary | ICD-10-CM | POA: Diagnosis not present

## 2017-11-05 DIAGNOSIS — D649 Anemia, unspecified: Secondary | ICD-10-CM | POA: Diagnosis not present

## 2017-11-05 NOTE — Telephone Encounter (Signed)
Called stating she was reviewing the patient medications and the patient does not have any of her  simvastatin (ZOCOR) 10 MG tablet Please advise on a refill

## 2017-11-06 MED ORDER — SIMVASTATIN 10 MG PO TABS: 10.0000 mg | ORAL_TABLET | Freq: Every day | ORAL | 0 refills | Status: DC

## 2017-11-06 NOTE — Telephone Encounter (Signed)
LVM for pt to call back as soon as possible.   RE: rx sent as requested.

## 2017-11-11 DIAGNOSIS — Q249 Congenital malformation of heart, unspecified: Secondary | ICD-10-CM | POA: Diagnosis not present

## 2017-11-11 DIAGNOSIS — M810 Age-related osteoporosis without current pathological fracture: Secondary | ICD-10-CM | POA: Diagnosis not present

## 2017-11-11 DIAGNOSIS — R69 Illness, unspecified: Secondary | ICD-10-CM | POA: Diagnosis not present

## 2017-11-11 DIAGNOSIS — D649 Anemia, unspecified: Secondary | ICD-10-CM | POA: Diagnosis not present

## 2017-11-11 DIAGNOSIS — I10 Essential (primary) hypertension: Secondary | ICD-10-CM | POA: Diagnosis not present

## 2017-11-13 DIAGNOSIS — Z952 Presence of prosthetic heart valve: Secondary | ICD-10-CM

## 2017-11-13 DIAGNOSIS — M810 Age-related osteoporosis without current pathological fracture: Secondary | ICD-10-CM | POA: Diagnosis not present

## 2017-11-13 DIAGNOSIS — E039 Hypothyroidism, unspecified: Secondary | ICD-10-CM | POA: Diagnosis not present

## 2017-11-13 DIAGNOSIS — E785 Hyperlipidemia, unspecified: Secondary | ICD-10-CM | POA: Diagnosis not present

## 2017-11-13 DIAGNOSIS — Z7982 Long term (current) use of aspirin: Secondary | ICD-10-CM | POA: Diagnosis not present

## 2017-11-13 DIAGNOSIS — Q828 Other specified congenital malformations of skin: Secondary | ICD-10-CM | POA: Diagnosis not present

## 2017-11-13 DIAGNOSIS — Q249 Congenital malformation of heart, unspecified: Secondary | ICD-10-CM | POA: Diagnosis not present

## 2017-11-13 DIAGNOSIS — Z87891 Personal history of nicotine dependence: Secondary | ICD-10-CM

## 2017-11-13 DIAGNOSIS — Z9181 History of falling: Secondary | ICD-10-CM

## 2017-11-13 DIAGNOSIS — Z951 Presence of aortocoronary bypass graft: Secondary | ICD-10-CM | POA: Diagnosis not present

## 2017-11-13 DIAGNOSIS — R69 Illness, unspecified: Secondary | ICD-10-CM | POA: Diagnosis not present

## 2017-11-13 DIAGNOSIS — D649 Anemia, unspecified: Secondary | ICD-10-CM | POA: Diagnosis not present

## 2017-11-13 DIAGNOSIS — F419 Anxiety disorder, unspecified: Secondary | ICD-10-CM | POA: Diagnosis not present

## 2017-11-13 DIAGNOSIS — I10 Essential (primary) hypertension: Secondary | ICD-10-CM | POA: Diagnosis not present

## 2017-11-13 DIAGNOSIS — G5791 Unspecified mononeuropathy of right lower limb: Secondary | ICD-10-CM | POA: Diagnosis not present

## 2017-11-13 DIAGNOSIS — F320 Major depressive disorder, single episode, mild: Secondary | ICD-10-CM | POA: Diagnosis not present

## 2017-11-14 ENCOUNTER — Ambulatory Visit: Payer: Medicare HMO | Admitting: Internal Medicine

## 2017-11-18 DIAGNOSIS — Q249 Congenital malformation of heart, unspecified: Secondary | ICD-10-CM | POA: Diagnosis not present

## 2017-11-18 DIAGNOSIS — I10 Essential (primary) hypertension: Secondary | ICD-10-CM | POA: Diagnosis not present

## 2017-11-18 DIAGNOSIS — M810 Age-related osteoporosis without current pathological fracture: Secondary | ICD-10-CM | POA: Diagnosis not present

## 2017-11-18 DIAGNOSIS — D649 Anemia, unspecified: Secondary | ICD-10-CM | POA: Diagnosis not present

## 2017-11-18 DIAGNOSIS — R69 Illness, unspecified: Secondary | ICD-10-CM | POA: Diagnosis not present

## 2017-11-25 ENCOUNTER — Other Ambulatory Visit: Payer: Self-pay | Admitting: Internal Medicine

## 2017-11-25 DIAGNOSIS — R7989 Other specified abnormal findings of blood chemistry: Secondary | ICD-10-CM

## 2017-11-25 DIAGNOSIS — E039 Hypothyroidism, unspecified: Secondary | ICD-10-CM

## 2017-11-28 DIAGNOSIS — Q249 Congenital malformation of heart, unspecified: Secondary | ICD-10-CM | POA: Diagnosis not present

## 2017-11-28 DIAGNOSIS — I10 Essential (primary) hypertension: Secondary | ICD-10-CM | POA: Diagnosis not present

## 2017-11-28 DIAGNOSIS — M810 Age-related osteoporosis without current pathological fracture: Secondary | ICD-10-CM | POA: Diagnosis not present

## 2017-11-28 DIAGNOSIS — D649 Anemia, unspecified: Secondary | ICD-10-CM | POA: Diagnosis not present

## 2017-11-28 DIAGNOSIS — R69 Illness, unspecified: Secondary | ICD-10-CM | POA: Diagnosis not present

## 2017-12-06 ENCOUNTER — Ambulatory Visit (INDEPENDENT_AMBULATORY_CARE_PROVIDER_SITE_OTHER): Payer: Medicare HMO | Admitting: Neurology

## 2017-12-06 ENCOUNTER — Telehealth: Payer: Self-pay | Admitting: Neurology

## 2017-12-06 ENCOUNTER — Encounter: Payer: Self-pay | Admitting: Neurology

## 2017-12-06 VITALS — BP 120/82 | HR 70 | Ht 63.0 in | Wt 120.0 lb

## 2017-12-06 DIAGNOSIS — F320 Major depressive disorder, single episode, mild: Secondary | ICD-10-CM | POA: Diagnosis not present

## 2017-12-06 DIAGNOSIS — F411 Generalized anxiety disorder: Secondary | ICD-10-CM | POA: Diagnosis not present

## 2017-12-06 DIAGNOSIS — F09 Unspecified mental disorder due to known physiological condition: Secondary | ICD-10-CM | POA: Diagnosis not present

## 2017-12-06 DIAGNOSIS — R51 Headache: Secondary | ICD-10-CM | POA: Diagnosis not present

## 2017-12-06 DIAGNOSIS — R69 Illness, unspecified: Secondary | ICD-10-CM | POA: Diagnosis not present

## 2017-12-06 DIAGNOSIS — R519 Headache, unspecified: Secondary | ICD-10-CM

## 2017-12-06 NOTE — Telephone Encounter (Signed)
Patient states that she needs to give you her list of medication she is on

## 2017-12-06 NOTE — Patient Instructions (Signed)
1. Call our office and read off the bottles of your medications to confirm what you are taking 2. Schedule repeat memory testing in August 2019 3. Follow-up in 6 months, call for any changes

## 2017-12-06 NOTE — Telephone Encounter (Signed)
Returned call to pt.  No answer.  No voicemail / answering machine.  Unable to leave message.
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
## 2017-12-26 DIAGNOSIS — D1801 Hemangioma of skin and subcutaneous tissue: Secondary | ICD-10-CM | POA: Diagnosis not present

## 2017-12-26 DIAGNOSIS — L821 Other seborrheic keratosis: Secondary | ICD-10-CM | POA: Diagnosis not present

## 2017-12-26 DIAGNOSIS — D225 Melanocytic nevi of trunk: Secondary | ICD-10-CM | POA: Diagnosis not present

## 2017-12-26 DIAGNOSIS — Q828 Other specified congenital malformations of skin: Secondary | ICD-10-CM | POA: Diagnosis not present

## 2018-01-19 ENCOUNTER — Other Ambulatory Visit: Payer: Self-pay | Admitting: Internal Medicine

## 2018-01-19 DIAGNOSIS — F320 Major depressive disorder, single episode, mild: Secondary | ICD-10-CM

## 2018-01-19 MED ORDER — ESCITALOPRAM OXALATE 10 MG PO TABS: 10.0000 mg | ORAL_TABLET | Freq: Every day | ORAL | 1 refills | Status: DC

## 2018-01-28 ENCOUNTER — Ambulatory Visit (INDEPENDENT_AMBULATORY_CARE_PROVIDER_SITE_OTHER): Payer: Medicare HMO | Admitting: Internal Medicine

## 2018-01-28 ENCOUNTER — Other Ambulatory Visit (INDEPENDENT_AMBULATORY_CARE_PROVIDER_SITE_OTHER): Payer: Medicare HMO

## 2018-01-28 ENCOUNTER — Encounter: Payer: Self-pay | Admitting: Internal Medicine

## 2018-01-28 VITALS — BP 130/80 | HR 77 | Temp 98.9°F | Ht 63.0 in | Wt 116.1 lb

## 2018-01-28 DIAGNOSIS — E785 Hyperlipidemia, unspecified: Secondary | ICD-10-CM | POA: Diagnosis not present

## 2018-01-28 DIAGNOSIS — Q828 Other specified congenital malformations of skin: Secondary | ICD-10-CM | POA: Diagnosis not present

## 2018-01-28 DIAGNOSIS — I10 Essential (primary) hypertension: Secondary | ICD-10-CM | POA: Diagnosis not present

## 2018-01-28 DIAGNOSIS — E039 Hypothyroidism, unspecified: Secondary | ICD-10-CM

## 2018-01-28 LAB — LIPID PANEL
Cholesterol: 167 mg/dL (ref 0–200)
HDL: 31.5 mg/dL — AB (ref >39.00)
LDL Cholesterol: 110 mg/dL — ABNORMAL HIGH (ref 0–99)
NonHDL: 135.19
Total CHOL/HDL Ratio: 5
Triglycerides: 127 mg/dL (ref 0.0–149.0)
VLDL: 25.4 mg/dL (ref 0.0–40.0)

## 2018-01-28 LAB — COMPREHENSIVE METABOLIC PANEL
ALBUMIN: 4.1 g/dL (ref 3.5–5.2)
ALT: 7 U/L (ref 0–35)
AST: 19 U/L (ref 0–37)
Alkaline Phosphatase: 48 U/L (ref 39–117)
BUN: 15 mg/dL (ref 6–23)
CHLORIDE: 105 meq/L (ref 96–112)
CO2: 28 mEq/L (ref 19–32)
Calcium: 8.8 mg/dL (ref 8.4–10.5)
Creatinine, Ser: 1.06 mg/dL (ref 0.40–1.20)
GFR: 54.8 mL/min — ABNORMAL LOW (ref >60.00)
Glucose, Bld: 78 mg/dL (ref 70–99)
POTASSIUM: 4.1 meq/L (ref 3.5–5.1)
SODIUM: 139 meq/L (ref 135–145)
Total Bilirubin: 0.4 mg/dL (ref 0.2–1.2)
Total Protein: 6.6 g/dL (ref 6.0–8.3)

## 2018-01-28 LAB — CBC WITH DIFFERENTIAL/PLATELET
BASOS PCT: 0.8 % (ref 0.0–3.0)
Basophils Absolute: 0.1 10*3/uL (ref 0.0–0.1)
EOS PCT: 3.9 % (ref 0.0–5.0)
Eosinophils Absolute: 0.3 10*3/uL (ref 0.0–0.7)
HCT: 41.6 % (ref 36.0–46.0)
HEMOGLOBIN: 13.5 g/dL (ref 12.0–15.0)
LYMPHS ABS: 2.1 10*3/uL (ref 0.7–4.0)
Lymphocytes Relative: 26 % (ref 12.0–46.0)
MCHC: 32.4 g/dL (ref 30.0–36.0)
MCV: 88.2 fl (ref 78.0–100.0)
MONO ABS: 0.5 10*3/uL (ref 0.1–1.0)
Monocytes Relative: 6.8 % (ref 3.0–12.0)
NEUTROS ABS: 4.9 10*3/uL (ref 1.4–7.7)
NEUTROS PCT: 62.5 % (ref 43.0–77.0)
PLATELETS: 223 10*3/uL (ref 150.0–400.0)
RBC: 4.71 Mil/uL (ref 3.87–5.11)
RDW: 15.3 % (ref 11.5–15.5)
WBC: 7.9 10*3/uL (ref 4.0–10.5)

## 2018-01-28 LAB — BASIC METABOLIC PANEL
BUN: 15 mg/dL (ref 6–23)
CHLORIDE: 105 meq/L (ref 96–112)
CO2: 28 mEq/L (ref 19–32)
Calcium: 8.8 mg/dL (ref 8.4–10.5)
Creatinine, Ser: 1.06 mg/dL (ref 0.40–1.20)
GFR: 54.8 mL/min — ABNORMAL LOW (ref >60.00)
Glucose, Bld: 78 mg/dL (ref 70–99)
POTASSIUM: 4.1 meq/L (ref 3.5–5.1)
Sodium: 139 mEq/L (ref 135–145)

## 2018-01-28 LAB — TSH: TSH: 1.42 u[IU]/mL (ref 0.35–4.50)

## 2018-01-28 NOTE — Progress Notes (Signed)
Subjective:  Patient ID: Michelle [name], female    DOB: October 11, 1951  Age: 68 y.o. MRN: [id number]  CC: Hypertension and Hypothyroidism   HPI Michelle [name] presents for f/up - She has felt well recently.  She denies changes in her weight or appetite.  She denies fatigue, constipation, or edema.  Outpatient Medications Prior to Visit  Medication Sig Dispense Refill  . acetaminophen (TYLENOL) 500 MG tablet Take 500 mg by mouth every 6 (six) hours as needed.    Marland Kitchen amitriptyline (ELAVIL) 50 MG tablet Take 1 tablet (50 mg total) by mouth at bedtime. 90 tablet 3  . aspirin 81 MG tablet Take 81 mg by mouth daily.      . betamethasone dipropionate (DIPROLENE) 0.05 % cream APPLY ON THE SKIN TWICE A DAY AS NEEDED FOR AREAS ON BODY  2  . cholecalciferol (VITAMIN D) 1000 UNITS tablet Take 2,000 Units by mouth daily.    Marland Kitchen escitalopram (LEXAPRO) 10 MG tablet Take 1 tablet (10 mg total) by mouth at bedtime. 90 tablet 1  . Ginkgo Biloba 40 MG TABS Take 120 mg by mouth daily.    Marland Kitchen levothyroxine (SYNTHROID, LEVOTHROID) 50 MCG tablet TAKE 1 TABLET BY MOUTH EVERY DAY 90 tablet 1  . Multiple Vitamin (MULTIVITAMIN) tablet Take 1 tablet by mouth daily.    . simvastatin (ZOCOR) 10 MG tablet Take 1 tablet (10 mg total) by mouth daily. 30 tablet 0  . vitamin E 1000 UNIT capsule Take 1,000 Units by mouth daily.    Marland Kitchen torsemide (DEMADEX) 10 MG tablet TAKE 1 TABLET (10 MG TOTAL) BY MOUTH DAILY. 90 tablet 1   No facility-administered medications prior to visit.     ROS Review of Systems  Constitutional: Negative.  Negative for activity change, appetite change, diaphoresis, fatigue and unexpected weight change.  HENT: Negative.   Eyes: Negative for visual disturbance.  Respiratory: Negative for cough, chest tightness, shortness of breath and wheezing.   Cardiovascular: Negative for palpitations and leg swelling.  Gastrointestinal: Negative for abdominal pain, constipation, nausea and vomiting.  Endocrine:  Negative for cold intolerance and heat intolerance.  Genitourinary: Negative.  Negative for difficulty urinating.  Musculoskeletal: Negative.  Negative for arthralgias and myalgias.  Skin: Positive for rash.  Neurological: Negative.  Negative for dizziness, weakness and light-headedness.  Hematological: Negative for adenopathy. Does not bruise/bleed easily.  Psychiatric/Behavioral: Negative.     Objective:  BP 130/80 (BP Location: Right Arm, Patient Position: Sitting, Cuff Size: Normal)   Pulse 77   Temp 98.9 F (37.2 C) (Oral)   Ht 5\' 3"  (1.6 m)   Wt 116 lb 1.3 oz (52.7 kg)   SpO2 98%   BMI 20.56 kg/m   BP Readings from Last 3 Encounters:  01/28/18 130/80  12/06/17 120/82  10/28/17 (!) 142/84    Wt Readings from Last 3 Encounters:  01/28/18 116 lb 1.3 oz (52.7 kg)  12/06/17 120 lb (54.4 kg)  10/28/17 124 lb (56.2 kg)    Physical Exam  Constitutional: She is oriented to person, place, and time. No distress.  HENT:  Mouth/Throat: Oropharynx is clear and moist. No oropharyngeal exudate.  Eyes: Conjunctivae are normal. Left eye exhibits no discharge. No scleral icterus.  Neck: Normal range of motion. Neck supple. No JVD present. No thyromegaly present.  Cardiovascular: Normal rate, regular rhythm and normal heart sounds. Exam reveals no gallop.  No murmur heard. Pulmonary/Chest: Effort normal and breath sounds normal. No respiratory distress. She has no wheezes. She  has no rales.  Abdominal: Bowel sounds are normal. She exhibits no distension and no mass. There is no tenderness.  Musculoskeletal: Normal range of motion. She exhibits no edema or tenderness.  Lymphadenopathy:    She has no cervical adenopathy.  Neurological: She is alert and oriented to person, place, and time.  Skin: Skin is warm and dry. Rash noted. No purpura noted. Rash is papular. Rash is not nodular, not vesicular and not urticarial. She is not diaphoretic. No erythema. No pallor.  She has large swaths  of scaling, coalesced papules, and lichenification over her torso and extremities.  This is unchanged compared to her prior exams.  Psychiatric: She has a normal mood and affect. Her behavior is normal. Judgment and thought content normal.  Vitals reviewed.   Lab Results  Component Value Date   WBC 7.9 01/28/2018   HGB 13.5 01/28/2018   HCT 41.6 01/28/2018   PLT 223.0 01/28/2018   GLUCOSE 78 01/28/2018   GLUCOSE 78 01/28/2018   CHOL 167 01/28/2018   TRIG 127.0 01/28/2018   HDL 31.50 (L) 01/28/2018   LDLDIRECT 113 (H) 06/16/2008   LDLCALC 110 (H) 01/28/2018   ALT 7 01/28/2018   AST 19 01/28/2018   NA 139 01/28/2018   NA 139 01/28/2018   K 4.1 01/28/2018   K 4.1 01/28/2018   CL 105 01/28/2018   CL 105 01/28/2018   CREATININE 1.06 01/28/2018   CREATININE 1.06 01/28/2018   BUN 15 01/28/2018   BUN 15 01/28/2018   CO2 28 01/28/2018   CO2 28 01/28/2018   TSH 1.42 01/28/2018   INR 1.10 08/07/2017    Mr Brain Wo Contrast  Result Date: 08/22/2017 CLINICAL DATA:  Chronic daily headache EXAM: MRI HEAD WITHOUT CONTRAST TECHNIQUE: Multiplanar, multiecho pulse sequences of the brain and surrounding structures were obtained without intravenous contrast. COMPARISON:  CT head 06/26/2016 FINDINGS: Brain: Ventricles mildly prominent sized, stable from the prior CT. No significant cerebral atrophy. Third and fourth ventricles not significantly dilated. Scattered subcortical white matter hyperintensities bilaterally. No acute infarct. Negative for mass. Chronic hemorrhage in the cerebellum bilaterally, and in the occipital lobes bilaterally. Chronic microhemorrhage in the deep white matter on the left. This may be due to poorly controlled hypertension. Vascular: Normal arterial flow void Skull and upper cervical spine: Negative Sinuses/Orbits: Negative Other: None IMPRESSION: Small hyperintensities in the subcortical white matter bilaterally most likely chronic microvascular ischemia. Scattered areas  of chronic microhemorrhage likely due to hypertension. Amyloid also possible. Mild ventricular enlargement stable from the prior study. This appears to have gradually progressed since 2007. Electronically Signed   By: Franchot Gallo M.D.   On: 08/22/2017 15:11    Assessment & Plan:   Calynn was seen today for hypertension and hypothyroidism.  Diagnoses and all orders for this visit:  Acquired hypothyroidism- Her TSH is in the normal range.  She will remain on the current dose of levothyroxine. -     TSH; Future  Essential hypertension- Her blood pressure is well controlled.  Electrolytes and renal function are normal. -     CBC with Differential/Platelet; Future -     Basic metabolic panel; Future  Hyperlipidemia LDL goal <100- She has achieved her LDL goal and is doing well on the statin. -     Lipid panel; Future  Darier-White disease- She will continue using the topical steroids as needed and she agrees to follow-up with dermatology as recommended.   I am having Bonnita Nasuti I. Cage maintain her  aspirin, cholecalciferol, vitamin E, amitriptyline, Ginkgo Biloba, multivitamin, acetaminophen, torsemide, simvastatin, levothyroxine, escitalopram, and betamethasone dipropionate.  No orders of the defined types were placed in this encounter.    Follow-up: Return in about 6 months (around 07/28/2018).  [name], MD

## 2018-01-28 NOTE — Patient Instructions (Signed)
Hypothyroidism Hypothyroidism is a disorder of the thyroid. The thyroid is a large gland that is located in the lower front of the neck. The thyroid releases hormones that control how the body works. With hypothyroidism, the thyroid does not make enough of these hormones. What are the causes? Causes of hypothyroidism may include:  Viral infections.  Pregnancy.  Your own defense system (immune system) attacking your thyroid.  Certain medicines.  Birth defects.  Past radiation treatments to your head or neck.  Past treatment with radioactive iodine.  Past surgical removal of part or all of your thyroid.  Problems with the gland that is located in the center of your brain (pituitary).  What are the signs or symptoms? Signs and symptoms of hypothyroidism may include:  Feeling as though you have no energy (lethargy).  Inability to tolerate cold.  Weight gain that is not explained by a change in diet or exercise habits.  Dry skin.  Coarse hair.  Menstrual irregularity.  Slowing of thought processes.  Constipation.  Sadness or depression.  How is this diagnosed? Your health care provider may diagnose hypothyroidism with blood tests and ultrasound tests. How is this treated? Hypothyroidism is treated with medicine that replaces the hormones that your body does not make. After you begin treatment, it may take several weeks for symptoms to go away. Follow these instructions at home:  Take medicines only as directed by your health care provider.  If you start taking any new medicines, tell your health care provider.  Keep all follow-up visits as directed by your health care provider. This is important. As your condition improves, your dosage needs may change. You will need to have blood tests regularly so that your health care provider can watch your condition. Contact a health care provider if:  Your symptoms do not get better with treatment.  You are taking thyroid  replacement medicine and: ? You sweat excessively. ? You have tremors. ? You feel anxious. ? You lose weight rapidly. ? You cannot tolerate heat. ? You have emotional swings. ? You have diarrhea. ? You feel weak. Get help right away if:  You develop chest pain.  You develop an irregular heartbeat.  You develop a rapid heartbeat. This information is not intended to replace advice given to you by your health care provider. Make sure you discuss any questions you have with your health care provider. Document Released: 12/03/2005 Document Revised: 05/10/2016 Document Reviewed: 04/20/2014 Elsevier Interactive Patient Education  2018 [employer]  

## 2018-03-08 ENCOUNTER — Encounter (HOSPITAL_COMMUNITY): Payer: Self-pay | Admitting: Family Medicine

## 2018-03-08 ENCOUNTER — Ambulatory Visit (HOSPITAL_COMMUNITY)
Admission: EM | Admit: 2018-03-08 | Discharge: 2018-03-08 | Disposition: A | Discharge status: Home / Self Care | Payer: Medicare HMO | Attending: Family Medicine | Admitting: Family Medicine

## 2018-03-08 DIAGNOSIS — Q828 Other specified congenital malformations of skin: Secondary | ICD-10-CM

## 2018-03-08 DIAGNOSIS — R21 Rash and other nonspecific skin eruption: Secondary | ICD-10-CM

## 2018-03-08 MED ORDER — TRIAMCINOLONE ACETONIDE 0.1 % EX CREA: 1.0000 "application " | TOPICAL_CREAM | Freq: Two times a day (BID) | CUTANEOUS | 0 refills | Status: DC

## 2018-03-08 MED ORDER — PREDNISONE 50 MG PO TABS: 50.0000 mg | ORAL_TABLET | Freq: Every day | ORAL | 0 refills | Status: AC

## 2018-03-08 NOTE — ED Triage Notes (Signed)
Pt here for rash to abdomen and back. This is chronic but she reports worse and she is out of her meds.

## 2018-03-08 NOTE — Discharge Instructions (Signed)
I have refilled your triamcinolone cream  Please begin prednisone daily for the next 5 days.
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
## 2018-03-20 DIAGNOSIS — Q828 Other specified congenital malformations of skin: Secondary | ICD-10-CM | POA: Diagnosis not present

## 2018-03-20 DIAGNOSIS — B354 Tinea corporis: Secondary | ICD-10-CM | POA: Diagnosis not present

## 2018-04-15 ENCOUNTER — Telehealth: Payer: Self-pay

## 2018-04-15 DIAGNOSIS — Q828 Other specified congenital malformations of skin: Secondary | ICD-10-CM | POA: Diagnosis not present

## 2018-04-15 NOTE — Telephone Encounter (Signed)
1. Contacted pt and pt states that the lexapro 10mg  is not working.   2. Can you clarify if pt is to continue taking the amitriptyline? If so, she needs a refill of it.

## 2018-04-15 NOTE — Telephone Encounter (Signed)
Copied from [place] [id number]. Topic: General - Other >> Apr 14, 2018 10:56 AM Oneta Rack wrote: Caller name: Lelon Perla  Relation to pt: boyfriend  Call back [place]: [id number] [place], [place] [phone] (Phone) [phone] (Fax)   Reason for call:  Please advise patient directly regarding amitriptyline (ELAVIL) 50 MG tablet alternate, boyfriend states alternate  is not working, please advise  >> Apr 14, 2018 10:59 AM Oneta Rack wrote: Caller name: Lelon Perla  Relation to pt: boyfriend  Call back number:[phone] Pharmacy: [id number] - [place], [place] [phone] (Fax)   Reason for call:  Please advise patient directly regarding amitriptyline (ELAVIL) 50 MG tablet alternate, boyfriend states alternate  is not working, please advise

## 2018-04-15 NOTE — Telephone Encounter (Signed)
Does she want to try a higher dose of Lexapro or switch to a different antidepressant?

## 2018-04-16 ENCOUNTER — Other Ambulatory Visit: Payer: Self-pay | Admitting: Internal Medicine

## 2018-04-16 DIAGNOSIS — R7989 Other specified abnormal findings of blood chemistry: Secondary | ICD-10-CM

## 2018-04-16 DIAGNOSIS — E039 Hypothyroidism, unspecified: Secondary | ICD-10-CM

## 2018-04-16 DIAGNOSIS — F419 Anxiety disorder, unspecified: Secondary | ICD-10-CM

## 2018-04-16 DIAGNOSIS — F320 Major depressive disorder, single episode, mild: Secondary | ICD-10-CM

## 2018-04-16 MED ORDER — ESCITALOPRAM OXALATE 20 MG PO TABS: 20.0000 mg | ORAL_TABLET | Freq: Every day | ORAL | 1 refills | Status: DC

## 2018-04-16 NOTE — Telephone Encounter (Signed)
Pt stated that she will try a higher dose of the Lexapro.

## 2018-04-25 ENCOUNTER — Ambulatory Visit (INDEPENDENT_AMBULATORY_CARE_PROVIDER_SITE_OTHER): Payer: Medicare HMO | Admitting: Family Medicine

## 2018-04-25 ENCOUNTER — Encounter: Payer: Self-pay | Admitting: Family Medicine

## 2018-04-25 DIAGNOSIS — Q828 Other specified congenital malformations of skin: Secondary | ICD-10-CM | POA: Diagnosis not present

## 2018-04-25 DIAGNOSIS — G5791 Unspecified mononeuropathy of right lower limb: Secondary | ICD-10-CM

## 2018-04-25 MED ORDER — PREDNISONE 50 MG PO TABS: ORAL_TABLET | ORAL | 0 refills | Status: DC

## 2018-04-25 MED ORDER — AMITRIPTYLINE HCL 50 MG PO TABS: 50.0000 mg | ORAL_TABLET | Freq: Every day | ORAL | 0 refills | Status: DC
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
## 2018-04-25 NOTE — Assessment & Plan Note (Addendum)
This seems similar to previous outbreaks related to this disease.  She is taking tramadol and Lexapro but does not appear to be Stevens-Johnson syndrome.  She is wanting a refill of her amitriptyline that was prescribed previously. -Prednisone -Counseled on taking an antihistamine for itching -Refilled amitriptyline but counseled she will need to stop the tramadol.

## 2018-04-25 NOTE — Patient Instructions (Signed)
Please take the prednisone  Please take zyrtec or allegra for itching  Please follow up next week if your symptoms continue  Please seek immediate care if your symptoms worsen

## 2018-05-05 ENCOUNTER — Ambulatory Visit (INDEPENDENT_AMBULATORY_CARE_PROVIDER_SITE_OTHER): Payer: Medicare HMO | Admitting: Internal Medicine

## 2018-05-05 ENCOUNTER — Encounter: Payer: Self-pay | Admitting: Internal Medicine

## 2018-05-05 ENCOUNTER — Other Ambulatory Visit (INDEPENDENT_AMBULATORY_CARE_PROVIDER_SITE_OTHER): Payer: Medicare HMO

## 2018-05-05 VITALS — BP 140/80 | HR 74 | Temp 98.2°F | Resp 16 | Ht 63.0 in | Wt 129.0 lb

## 2018-05-05 DIAGNOSIS — I10 Essential (primary) hypertension: Secondary | ICD-10-CM

## 2018-05-05 DIAGNOSIS — Q828 Other specified congenital malformations of skin: Secondary | ICD-10-CM

## 2018-05-05 DIAGNOSIS — E785 Hyperlipidemia, unspecified: Secondary | ICD-10-CM | POA: Diagnosis not present

## 2018-05-05 DIAGNOSIS — R7989 Other specified abnormal findings of blood chemistry: Secondary | ICD-10-CM

## 2018-05-05 DIAGNOSIS — E039 Hypothyroidism, unspecified: Secondary | ICD-10-CM

## 2018-05-05 DIAGNOSIS — Z124 Encounter for screening for malignant neoplasm of cervix: Secondary | ICD-10-CM

## 2018-05-05 LAB — CBC WITH DIFFERENTIAL/PLATELET
BASOS ABS: 0.1 10*3/uL (ref 0.0–0.1)
Basophils Relative: 0.7 % (ref 0.0–3.0)
Eosinophils Absolute: 0.4 10*3/uL (ref 0.0–0.7)
Eosinophils Relative: 3.4 % (ref 0.0–5.0)
HEMATOCRIT: 37.1 % (ref 36.0–46.0)
HEMOGLOBIN: 12.1 g/dL (ref 12.0–15.0)
LYMPHS PCT: 24.2 % (ref 12.0–46.0)
Lymphs Abs: 2.8 10*3/uL (ref 0.7–4.0)
MCHC: 32.6 g/dL (ref 30.0–36.0)
MCV: 89.7 fl (ref 78.0–100.0)
MONOS PCT: 7.6 % (ref 3.0–12.0)
Monocytes Absolute: 0.9 10*3/uL (ref 0.1–1.0)
NEUTROS PCT: 64.1 % (ref 43.0–77.0)
Neutro Abs: 7.4 10*3/uL (ref 1.4–7.7)
Platelets: 343 10*3/uL (ref 150.0–400.0)
RBC: 4.14 Mil/uL (ref 3.87–5.11)
RDW: 15.6 % — ABNORMAL HIGH (ref 11.5–15.5)
WBC: 11.6 10*3/uL — AB (ref 4.0–10.5)

## 2018-05-05 LAB — TSH: TSH: 4.7 u[IU]/mL — AB (ref 0.35–4.50)

## 2018-05-05 MED ORDER — CETIRIZINE HCL 10 MG PO TABS: 10.0000 mg | ORAL_TABLET | Freq: Two times a day (BID) | ORAL | 1 refills | Status: DC

## 2018-05-05 MED ORDER — SIMVASTATIN 10 MG PO TABS: 10.0000 mg | ORAL_TABLET | Freq: Every day | ORAL | 1 refills | Status: DC

## 2018-05-05 MED ORDER — LEVOTHYROXINE SODIUM 50 MCG PO TABS: 50.0000 ug | ORAL_TABLET | Freq: Every day | ORAL | 1 refills | Status: DC

## 2018-05-05 NOTE — Patient Instructions (Signed)
Hypothyroidism Hypothyroidism is a disorder of the thyroid. The thyroid is a large gland that is located in the lower front of the neck. The thyroid releases hormones that control how the body works. With hypothyroidism, the thyroid does not make enough of these hormones. What are the causes? Causes of hypothyroidism may include:  Viral infections.  Pregnancy.  Your own defense system (immune system) attacking your thyroid.  Certain medicines.  Birth defects.  Past radiation treatments to your head or neck.  Past treatment with radioactive iodine.  Past surgical removal of part or all of your thyroid.  Problems with the gland that is located in the center of your brain (pituitary).  What are the signs or symptoms? Signs and symptoms of hypothyroidism may include:  Feeling as though you have no energy (lethargy).  Inability to tolerate cold.  Weight gain that is not explained by a change in diet or exercise habits.  Dry skin.  Coarse hair.  Menstrual irregularity.  Slowing of thought processes.  Constipation.  Sadness or depression.  How is this diagnosed? Your health care provider may diagnose hypothyroidism with blood tests and ultrasound tests. How is this treated? Hypothyroidism is treated with medicine that replaces the hormones that your body does not make. After you begin treatment, it may take several weeks for symptoms to go away. Follow these instructions at home:  Take medicines only as directed by your health care provider.  If you start taking any new medicines, tell your health care provider.  Keep all follow-up visits as directed by your health care provider. This is important. As your condition improves, your dosage needs may change. You will need to have blood tests regularly so that your health care provider can watch your condition. Contact a health care provider if:  Your symptoms do not get better with treatment.  You are taking thyroid  replacement medicine and: ? You sweat excessively. ? You have tremors. ? You feel anxious. ? You lose weight rapidly. ? You cannot tolerate heat. ? You have emotional swings. ? You have diarrhea. ? You feel weak. Get help right away if:  You develop chest pain.  You develop an irregular heartbeat.  You develop a rapid heartbeat. This information is not intended to replace advice given to you by your health care provider. Make sure you discuss any questions you have with your health care provider. Document Released: 12/03/2005 Document Revised: 05/10/2016 Document Reviewed: 04/20/2014 Elsevier Interactive Patient Education  2018 [employer]  

## 2018-05-05 NOTE — Progress Notes (Signed)
Subjective:  Patient ID: Michelle [name], female    DOB: 1951/05/01  Age: 67 y.o. MRN: [id number]  CC: Hypothyroidism and Hyperlipidemia   HPI Michelle [name] presents for f/up - she tells me she is not taking her thyroid supplement.  She said she forgot she had thyroid disease.  She also tells me she saw her dermatologist about a month ago for her skin disease.  She said that he changed her steroid cream to a steroid ointment which has helped some.  She continues to complain of severe itching.  She is not taking any antihistamines to control the itching.  Outpatient Medications Prior to Visit  Medication Sig Dispense Refill  . amitriptyline (ELAVIL) 50 MG tablet Take 1 tablet (50 mg total) by mouth at bedtime. 30 tablet 0  . aspirin 81 MG tablet Take 81 mg by mouth daily.      . cholecalciferol (VITAMIN D) 1000 UNITS tablet Take 2,000 Units by mouth daily.    Marland Kitchen torsemide (DEMADEX) 10 MG tablet TAKE 1 TABLET (10 MG TOTAL) BY MOUTH DAILY. 90 tablet 1  . triamcinolone cream (KENALOG) 0.1 % Apply 1 application topically 2 (two) times daily. 453.6 g 0  . acetaminophen (TYLENOL) 500 MG tablet Take 500 mg by mouth every 6 (six) hours as needed.    . Ginkgo Biloba 40 MG TABS Take 120 mg by mouth daily.    . Multiple Vitamin (MULTIVITAMIN) tablet Take 1 tablet by mouth daily.    . betamethasone dipropionate (DIPROLENE) 0.05 % cream APPLY ON THE SKIN TWICE A DAY AS NEEDED FOR AREAS ON BODY  2  . escitalopram (LEXAPRO) 20 MG tablet Take 1 tablet (20 mg total) by mouth daily. (Patient not taking: Reported on 05/05/2018) 90 tablet 1  . vitamin E 1000 UNIT capsule Take 1,000 Units by mouth daily.    Marland Kitchen levothyroxine (SYNTHROID, LEVOTHROID) 50 MCG tablet TAKE 1 TABLET BY MOUTH EVERY DAY (Patient not taking: Reported on 05/05/2018) 90 tablet 1  . predniSONE (DELTASONE) 50 MG tablet One tab PO daily for 5 days. 5 tablet 0  . simvastatin (ZOCOR) 10 MG tablet Take 1 tablet (10 mg total) by mouth daily.  (Patient not taking: Reported on 05/05/2018) 30 tablet 0  . traMADol (ULTRAM) 50 MG tablet Take 50 mg by mouth 2 (two) times daily.  0   No facility-administered medications prior to visit.     ROS Review of Systems  Constitutional: Negative for appetite change, fatigue and fever.  HENT: Negative.   Eyes: Negative.   Respiratory: Negative.  Negative for cough, chest tightness, shortness of breath and wheezing.   Cardiovascular: Negative for chest pain and leg swelling.  Gastrointestinal: Negative for abdominal pain, constipation, diarrhea, nausea and vomiting.  Endocrine: Negative for cold intolerance and heat intolerance.  Genitourinary: Negative.  Negative for difficulty urinating.  Musculoskeletal: Negative.  Negative for back pain and myalgias.  Skin: Positive for rash. Negative for pallor.  Neurological: Negative.  Negative for dizziness, weakness and light-headedness.  Hematological: Negative for adenopathy. Does not bruise/bleed easily.  Psychiatric/Behavioral: Negative.  Negative for dysphoric mood and sleep disturbance. The patient is not nervous/anxious.     Objective:  BP 140/80 (BP Location: Left Arm, Patient Position: Sitting, Cuff Size: Normal)   Pulse 74   Temp 98.2 F (36.8 C) (Oral)   Resp 16   Ht 5\' 3"  (1.6 m)   Wt 129 lb (58.5 kg)   SpO2 96%   BMI 22.85 kg/m  BP Readings from Last 3 Encounters:  05/05/18 140/80  04/25/18 132/76  03/08/18 124/72    Wt Readings from Last 3 Encounters:  05/05/18 129 lb (58.5 kg)  04/25/18 122 lb (55.3 kg)  01/28/18 116 lb 1.3 oz (52.7 kg)    Physical Exam  Constitutional: She is oriented to person, place, and time. No distress.  HENT:  Mouth/Throat: No oropharyngeal exudate.  Eyes: Conjunctivae are normal.  Neck: Normal range of motion. Neck supple. No JVD present.  Cardiovascular: Normal rate, regular rhythm and normal heart sounds. Exam reveals no friction rub.  No murmur heard. Pulmonary/Chest: Effort normal  and breath sounds normal. She has no wheezes. She has no rales.  Abdominal: Soft. Bowel sounds are normal. She exhibits no mass. There is no tenderness.  Musculoskeletal: Normal range of motion. She exhibits no edema, tenderness or deformity.  Neurological: She is alert and oriented to person, place, and time.  Skin: Skin is warm and dry. Rash noted. She is not diaphoretic. No erythema.  There are large patches of coalesced papules with scale and excoriation.  These are located predominantly over the lower extremities symmetrically.  There are also some large patches over the abdomen and torso.  There are no areas of erythema, warmth, exudate, streaking, or induration.  Psychiatric: She has a normal mood and affect. Her behavior is normal. Judgment and thought content normal.  Vitals reviewed.   Lab Results  Component Value Date   WBC 11.6 (H) 05/05/2018   HGB 12.1 05/05/2018   HCT 37.1 05/05/2018   PLT 343.0 05/05/2018   GLUCOSE 78 01/28/2018   GLUCOSE 78 01/28/2018   CHOL 167 01/28/2018   TRIG 127.0 01/28/2018   HDL 31.50 (L) 01/28/2018   LDLDIRECT 113 (H) 06/16/2008   LDLCALC 110 (H) 01/28/2018   ALT 7 01/28/2018   AST 19 01/28/2018   NA 139 01/28/2018   NA 139 01/28/2018   K 4.1 01/28/2018   K 4.1 01/28/2018   CL 105 01/28/2018   CL 105 01/28/2018   CREATININE 1.06 01/28/2018   CREATININE 1.06 01/28/2018   BUN 15 01/28/2018   BUN 15 01/28/2018   CO2 28 01/28/2018   CO2 28 01/28/2018   TSH 4.70 (H) 05/05/2018   INR 1.10 08/07/2017    No results found.  Assessment & Plan:   Michelle [name] was seen today for hypothyroidism and hyperlipidemia.  Diagnoses and all orders for this visit:  Acquired hypothyroidism- Her TSH is mildly elevated.  She agrees to restart the levothyroxine. -     TSH; Future -     levothyroxine (SYNTHROID, LEVOTHROID) 50 MCG tablet; Take 1 tablet (50 mcg total) by mouth daily.  Essential hypertension- Her blood pressure is adequately well  controlled. -     CBC with Differential/Platelet; Future  Darier-White disease- I will add an antihistamine to control the itching.  She will continue to use the topical steroid ointment prescribed by her dermatologist. -     cetirizine (ZYRTEC) 10 MG tablet; Take 1 tablet (10 mg total) by mouth 2 (two) times daily.  Cervical cancer screening -     Ambulatory referral to Gynecology  TSH elevation  Hyperlipidemia LDL goal <100- She is doing well on the statin. -     simvastatin (ZOCOR) 10 MG tablet; Take 1 tablet (10 mg total) by mouth daily.   I have discontinued Michelle [name]'s Ginkgo Biloba, multivitamin, acetaminophen, traMADol, and predniSONE. I have also changed her levothyroxine. Additionally, I am having her  start on cetirizine. Lastly, I am having her maintain her aspirin, cholecalciferol, vitamin E, torsemide, betamethasone dipropionate, triamcinolone cream, escitalopram, amitriptyline, and simvastatin.  Meds ordered this encounter  Medications  . cetirizine (ZYRTEC) 10 MG tablet    Sig: Take 1 tablet (10 mg total) by mouth 2 (two) times daily.    Dispense:  180 tablet    Refill:  1  . levothyroxine (SYNTHROID, LEVOTHROID) 50 MCG tablet    Sig: Take 1 tablet (50 mcg total) by mouth daily.    Dispense:  90 tablet    Refill:  1  . simvastatin (ZOCOR) 10 MG tablet    Sig: Take 1 tablet (10 mg total) by mouth daily.    Dispense:  90 tablet    Refill:  1     Follow-up: Return in about 3 months (around 08/05/2018).  [name], MD

## 2018-05-20 ENCOUNTER — Ambulatory Visit: Payer: Medicare HMO | Admitting: Gynecology

## 2018-05-20 ENCOUNTER — Encounter: Payer: Self-pay | Admitting: Gynecology

## 2018-05-20 VITALS — BP 130/82 | Ht 63.0 in | Wt 121.0 lb

## 2018-05-20 DIAGNOSIS — Z01411 Encounter for gynecological examination (general) (routine) with abnormal findings: Secondary | ICD-10-CM | POA: Diagnosis not present

## 2018-05-20 DIAGNOSIS — N941 Unspecified dyspareunia: Secondary | ICD-10-CM | POA: Diagnosis not present

## 2018-05-20 DIAGNOSIS — Z124 Encounter for screening for malignant neoplasm of cervix: Secondary | ICD-10-CM | POA: Diagnosis not present

## 2018-05-20 DIAGNOSIS — M810 Age-related osteoporosis without current pathological fracture: Secondary | ICD-10-CM

## 2018-05-20 DIAGNOSIS — N952 Postmenopausal atrophic vaginitis: Secondary | ICD-10-CM

## 2018-05-20 LAB — HM PAP SMEAR

## 2018-05-20 NOTE — Progress Notes (Signed)
    Michelle [name] 03-Jan-1951 [id number]        67 y.o.  G0P0000 for breast and pelvic exam.  Also complaining of dyspareunia.  Has tried OTC lubricants without success.  Is not having daily vaginal dryness.  No significant hot flushes or night sweats.    Past medical history,surgical history, problem list, medications, allergies, family history and social history were all reviewed and documented as reviewed in the EPIC chart.  ROS:  Performed with pertinent positives and negatives included in the history, assessment and plan.   Additional significant findings : None   Exam: [name] assistant Vitals:   05/20/18 0929  BP: 130/82  Weight: 121 lb (54.9 kg)  Height: 5\' 3"  (1.6 m)   Body mass index is 21.43 kg/m.  General appearance:  Normal affect, orientation and appearance. 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  No cervical or supraclavicular adenopathy. Thyroid normal.  Lungs:  Clear without wheezing, rales or rhonchi Cardiac: RR, with click Abdominal:  Soft, nontender, without masses, guarding, rebound, organomegaly or hernia Breasts:  Examined lying and sitting without masses, retractions, discharge or axillary adenopathy. Pelvic:  Ext, BUS, Vagina: Atrophic  Cervix: Atrophic.  Pap smear done  Uterus: Anteverted, normal size, shape and contour, midline and mobile nontender   Adnexa: Without masses or tenderness    Anus and perineum: Normal   Rectovaginal: Normal sphincter tone without palpated masses or tenderness.    Assessment/Plan:  67 y.o. G0P0000 female for breast and pelvic exam.   1. Postmenopausal/atrophic genital changes/dyspareunia.  Patient presents with her partner to discuss her dyspareunia.  Not having other menopausal symptoms.  Not having vaginal dryness daily.  Is using OTC products, water-based.  I reviewed options with her to include trial of oral based products, vaginal estrogen such as  Vagifem vaginal estrogen creams, Osphena and vaginal laser.  The pros and cons of each choice discussed.  The risks of vaginal estrogen to include absorption with systemic effects including increased risk of thrombosis such as stroke heart attack DVT noting her medical issues/cardiac history as well as endometrial stimulation and the possible breast cancer issue.  After a lengthy discussion the patient wants to try oil-based products first.  If this is not satisfactory she will call and initiate vaginal estrogen cream understanding and accepting the risks. 2. Mammography 2017.  I reminded the patient she is overdue names and numbers provided for her to call and schedule a mammogram.  Breast exam normal today. 3. Pap smear/HPV 2014.  Pap smear done today.  No history of abnormal Pap smears previously. 4. Colonoscopy 2015.  Repeat at their recommended interval. 5. Osteoporosis.  DEXA reported 2016.  She is being followed by her primary physician for this and will follow-up with them for ongoing care. 6. Health maintenance.  No routine lab work done as patient has this done elsewhere.  Follow-up in 1 year, sooner as needed.  Additional time in excess of her breast and pelvic exam was spent in direct face to face counseling and coordination of care in regards to her vaginal atrophy and dyspareunia.    Anastasio Auerbach MD, 10:02 AM 05/20/2018

## 2018-05-20 NOTE — Addendum Note (Signed)
Addended by: Nelva Nay on: 05/20/2018 10:31 AM   Modules accepted: Orders

## 2018-05-20 NOTE — Patient Instructions (Signed)
Call to Schedule your mammogram  [facility]. [facility], [address] Phone: [phone]     Mammogram A mammogram is an X-ray test to find changes in a woman's breast. You should get a mammogram if:  You are 68 years of age or older  You have risk factors.   Your doctor recommends that you have one.  BEFORE THE TEST  Do not schedule the test the week before your period, especially if your breasts are sore during this time.  On the day of your mammogram:  Wash your breasts and armpits well. After washing, do not put on any deodorant or talcum powder on until after your test.   Eat and drink as you usually do.   Take your medicines as usual.   If you are diabetic and take insulin, make sure you:   Eat before coming for your test.   Take your insulin as usual.   If you cannot keep your appointment, call before the appointment to cancel. Schedule another appointment.  TEST  You will need to undress from the waist up. You will put on a hospital gown.   Your breast will be put on the mammogram machine, and it will press firmly on your breast with a piece of plastic called a compression paddle. This will make your breast flatter so that the machine can X-ray all parts of your breast.   Both breasts will be X-rayed. Each breast will be X-rayed from above and from the side. An X-ray might need to be taken again if the picture is not good enough.   The mammogram will last about 15 to 30 minutes.  AFTER THE TEST Finding out the results of your test Ask when your test results will be ready. Make sure you get your test results.  Document Released: 03/01/2009 Document Revised: 11/22/2011 Document Reviewed: 03/01/2009 Exit[employer] Patient Information 2012 [employer].  

## 2018-05-21 LAB — PAP IG W/ RFLX HPV ASCU

## 2018-05-24 ENCOUNTER — Other Ambulatory Visit: Payer: Self-pay | Admitting: Internal Medicine

## 2018-05-24 DIAGNOSIS — R600 Localized edema: Secondary | ICD-10-CM

## 2018-05-24 DIAGNOSIS — I10 Essential (primary) hypertension: Secondary | ICD-10-CM

## 2018-05-26 ENCOUNTER — Telehealth: Payer: Self-pay

## 2018-05-26 ENCOUNTER — Other Ambulatory Visit: Payer: Self-pay | Admitting: Internal Medicine

## 2018-05-26 DIAGNOSIS — G5791 Unspecified mononeuropathy of right lower limb: Secondary | ICD-10-CM

## 2018-05-26 MED ORDER — AMITRIPTYLINE HCL 50 MG PO TABS: 50.0000 mg | ORAL_TABLET | Freq: Every day | ORAL | 1 refills | Status: DC

## 2018-05-26 NOTE — Telephone Encounter (Signed)
Pt is requesting a refill of amitriptyline. Please advise.   Pharmacy: [facility].

## 2018-05-26 NOTE — Telephone Encounter (Signed)
Key: [id number]

## 2018-05-28 NOTE — Telephone Encounter (Signed)
PA was approved. Can you inform pt of same?  

## 2018-05-29 NOTE — Telephone Encounter (Signed)
Informed patient

## 2018-06-02 ENCOUNTER — Other Ambulatory Visit: Payer: Self-pay | Admitting: Internal Medicine

## 2018-06-02 DIAGNOSIS — Z1231 Encounter for screening mammogram for malignant neoplasm of breast: Secondary | ICD-10-CM

## 2018-06-03 ENCOUNTER — Ambulatory Visit (INDEPENDENT_AMBULATORY_CARE_PROVIDER_SITE_OTHER): Payer: Medicare HMO | Admitting: Internal Medicine

## 2018-06-03 ENCOUNTER — Other Ambulatory Visit (INDEPENDENT_AMBULATORY_CARE_PROVIDER_SITE_OTHER): Payer: Medicare HMO

## 2018-06-03 ENCOUNTER — Encounter: Payer: Self-pay | Admitting: Internal Medicine

## 2018-06-03 VITALS — BP 124/80 | HR 79 | Temp 98.4°F | Resp 16 | Ht 63.0 in | Wt 122.0 lb

## 2018-06-03 DIAGNOSIS — I10 Essential (primary) hypertension: Secondary | ICD-10-CM

## 2018-06-03 DIAGNOSIS — E039 Hypothyroidism, unspecified: Secondary | ICD-10-CM | POA: Diagnosis not present

## 2018-06-03 DIAGNOSIS — K921 Melena: Secondary | ICD-10-CM | POA: Diagnosis not present

## 2018-06-03 LAB — CBC WITH DIFFERENTIAL/PLATELET
BASOS PCT: 0.8 % (ref 0.0–3.0)
Basophils Absolute: 0.1 10*3/uL (ref 0.0–0.1)
EOS PCT: 2.6 % (ref 0.0–5.0)
Eosinophils Absolute: 0.2 10*3/uL (ref 0.0–0.7)
HCT: 38.8 % (ref 36.0–46.0)
HEMOGLOBIN: 13 g/dL (ref 12.0–15.0)
LYMPHS ABS: 1.9 10*3/uL (ref 0.7–4.0)
Lymphocytes Relative: 24.8 % (ref 12.0–46.0)
MCHC: 33.6 g/dL (ref 30.0–36.0)
MCV: 89.5 fl (ref 78.0–100.0)
MONOS PCT: 6.8 % (ref 3.0–12.0)
Monocytes Absolute: 0.5 10*3/uL (ref 0.1–1.0)
NEUTROS PCT: 65 % (ref 43.0–77.0)
Neutro Abs: 5 10*3/uL (ref 1.4–7.7)
Platelets: 279 10*3/uL (ref 150.0–400.0)
RBC: 4.33 Mil/uL (ref 3.87–5.11)
RDW: 14.4 % (ref 11.5–15.5)
WBC: 7.6 10*3/uL (ref 4.0–10.5)

## 2018-06-03 LAB — BASIC METABOLIC PANEL
BUN: 16 mg/dL (ref 6–23)
CALCIUM: 9 mg/dL (ref 8.4–10.5)
CHLORIDE: 101 meq/L (ref 96–112)
CO2: 31 meq/L (ref 19–32)
Creatinine, Ser: 1.19 mg/dL (ref 0.40–1.20)
GFR: 47.9 mL/min — ABNORMAL LOW (ref >60.00)
Glucose, Bld: 88 mg/dL (ref 70–99)
Potassium: 3.9 mEq/L (ref 3.5–5.1)
SODIUM: 139 meq/L (ref 135–145)

## 2018-06-03 LAB — TSH: TSH: 0.42 u[IU]/mL (ref 0.35–4.50)

## 2018-06-03 NOTE — Patient Instructions (Signed)
Hypothyroidism Hypothyroidism is a disorder of the thyroid. The thyroid is a large gland that is located in the lower front of the neck. The thyroid releases hormones that control how the body works. With hypothyroidism, the thyroid does not make enough of these hormones. What are the causes? Causes of hypothyroidism may include:  Viral infections.  Pregnancy.  Your own defense system (immune system) attacking your thyroid.  Certain medicines.  Birth defects.  Past radiation treatments to your head or neck.  Past treatment with radioactive iodine.  Past surgical removal of part or all of your thyroid.  Problems with the gland that is located in the center of your brain (pituitary).  What are the signs or symptoms? Signs and symptoms of hypothyroidism may include:  Feeling as though you have no energy (lethargy).  Inability to tolerate cold.  Weight gain that is not explained by a change in diet or exercise habits.  Dry skin.  Coarse hair.  Menstrual irregularity.  Slowing of thought processes.  Constipation.  Sadness or depression.  How is this diagnosed? Your health care provider may diagnose hypothyroidism with blood tests and ultrasound tests. How is this treated? Hypothyroidism is treated with medicine that replaces the hormones that your body does not make. After you begin treatment, it may take several weeks for symptoms to go away. Follow these instructions at home:  Take medicines only as directed by your health care provider.  If you start taking any new medicines, tell your health care provider.  Keep all follow-up visits as directed by your health care provider. This is important. As your condition improves, your dosage needs may change. You will need to have blood tests regularly so that your health care provider can watch your condition. Contact a health care provider if:  Your symptoms do not get better with treatment.  You are taking thyroid  replacement medicine and: ? You sweat excessively. ? You have tremors. ? You feel anxious. ? You lose weight rapidly. ? You cannot tolerate heat. ? You have emotional swings. ? You have diarrhea. ? You feel weak. Get help right away if:  You develop chest pain.  You develop an irregular heartbeat.  You develop a rapid heartbeat. This information is not intended to replace advice given to you by your health care provider. Make sure you discuss any questions you have with your health care provider. Document Released: 12/03/2005 Document Revised: 05/10/2016 Document Reviewed: 04/20/2014 Elsevier Interactive Patient Education  2018 [employer]  

## 2018-06-03 NOTE — Progress Notes (Signed)
Subjective:  Patient ID: Michelle [name], female    DOB: 1951-02-12  Age: 67 y.o. MRN: [id number]  CC: Hypothyroidism   HPI Michelle [name] presents for f/up - She complains that her stool has been dark for the last few weeks.  She also tells me that this was noticed after she started taking an over-the-counter iron supplement.  She does not think her stools have been black or tarry, they are just darker than usual.  She does not take anti-inflammatories or drink alcohol.  She does not have a history of ulcer disease.  She denies abdominal pain, nausea, vomiting, heartburn, trouble swallowing, odynophagia, or weight loss.  Outpatient Medications Prior to Visit  Medication Sig Dispense Refill  . amitriptyline (ELAVIL) 50 MG tablet Take 1 tablet (50 mg total) by mouth at bedtime. 90 tablet 1  . aspirin 81 MG tablet Take 81 mg by mouth daily.      . betamethasone dipropionate (DIPROLENE) 0.05 % cream APPLY ON THE SKIN TWICE A DAY AS NEEDED FOR AREAS ON BODY  2  . cetirizine (ZYRTEC) 10 MG tablet Take 1 tablet (10 mg total) by mouth 2 (two) times daily. 180 tablet 1  . cholecalciferol (VITAMIN D) 1000 UNITS tablet Take 2,000 Units by mouth daily.    Marland Kitchen escitalopram (LEXAPRO) 20 MG tablet Take 1 tablet (20 mg total) by mouth daily. 90 tablet 1  . levothyroxine (SYNTHROID, LEVOTHROID) 50 MCG tablet Take 1 tablet (50 mcg total) by mouth daily. 90 tablet 1  . simvastatin (ZOCOR) 10 MG tablet Take 1 tablet (10 mg total) by mouth daily. 90 tablet 1  . torsemide (DEMADEX) 10 MG tablet TAKE 1 TABLET BY MOUTH EVERY DAY 90 tablet 1  . triamcinolone ointment (KENALOG) 0.1 % 1 APPLICATION APPLY ON THE SKIN THREE TIMES A DAY PT TO USE 2-3 TIMES DAILY.  2  . vitamin E 1000 UNIT capsule Take 1,000 Units by mouth daily.    Marland Kitchen triamcinolone cream (KENALOG) 0.1 % Apply 1 application topically 2 (two) times daily. (Patient not taking: Reported on 06/03/2018) 453.6 g 0   No facility-administered medications prior to  visit.     ROS Review of Systems  Constitutional: Negative for appetite change, diaphoresis, fatigue and unexpected weight change.  HENT: Negative.  Negative for trouble swallowing and voice change.   Respiratory: Negative.  Negative for cough, chest tightness, shortness of breath and wheezing.   Cardiovascular: Negative for chest pain, palpitations and leg swelling.  Gastrointestinal: Negative for abdominal pain, constipation, diarrhea, nausea and vomiting.  Endocrine: Negative for cold intolerance and heat intolerance.  Genitourinary: Negative.  Negative for difficulty urinating.  Musculoskeletal: Negative for arthralgias and myalgias.  Skin: Negative.  Negative for pallor.  Neurological: Negative.  Negative for dizziness, weakness, light-headedness and numbness.  Hematological: Negative for adenopathy. Does not bruise/bleed easily.  Psychiatric/Behavioral: Negative.     Objective:  BP 124/80 (BP Location: Left Arm, Patient Position: Sitting, Cuff Size: Normal)   Pulse 79   Temp 98.4 F (36.9 C) (Oral)   Resp 16   Ht 5\' 3"  (1.6 m)   Wt 122 lb (55.3 kg)   SpO2 95%   BMI 21.61 kg/m   BP Readings from Last 3 Encounters:  06/03/18 124/80  05/20/18 130/82  05/05/18 140/80    Wt Readings from Last 3 Encounters:  06/03/18 122 lb (55.3 kg)  05/20/18 121 lb (54.9 kg)  05/05/18 129 lb (58.5 kg)    Physical Exam  Constitutional:  She is oriented to person, place, and time.  Non-toxic appearance. She does not have a sickly appearance. She does not appear ill. No distress.  HENT:  Mouth/Throat: Oropharynx is clear and moist. No oropharyngeal exudate.  Eyes: Conjunctivae are normal. No scleral icterus.  Neck: Normal range of motion. Neck supple. No JVD present. No thyromegaly present.  Cardiovascular: Normal rate, regular rhythm and normal heart sounds.  No murmur heard. Pulmonary/Chest: Effort normal and breath sounds normal. She has no wheezes. She has no rales.  Abdominal:  Soft. Normal appearance and bowel sounds are normal. She exhibits no distension. There is no hepatosplenomegaly. There is no tenderness. No hernia.  Musculoskeletal: Normal range of motion. She exhibits no edema, tenderness or deformity.  Lymphadenopathy:    She has no cervical adenopathy.  Neurological: She is alert and oriented to person, place, and time.  Skin: Skin is warm and dry. She is not diaphoretic. No pallor.  Psychiatric: She has a normal mood and affect. Her behavior is normal. Judgment and thought content normal.  Vitals reviewed.   Lab Results  Component Value Date   WBC 7.6 06/03/2018   HGB 13.0 06/03/2018   HCT 38.8 06/03/2018   PLT 279.0 06/03/2018   GLUCOSE 88 06/03/2018   CHOL 167 01/28/2018   TRIG 127.0 01/28/2018   HDL 31.50 (L) 01/28/2018   LDLDIRECT 113 (H) 06/16/2008   LDLCALC 110 (H) 01/28/2018   ALT 7 01/28/2018   AST 19 01/28/2018   NA 139 06/03/2018   K 3.9 06/03/2018   CL 101 06/03/2018   CREATININE 1.19 06/03/2018   BUN 16 06/03/2018   CO2 31 06/03/2018   TSH 0.42 06/03/2018   INR 1.10 08/07/2017    No results found.  Assessment & Plan:   Michelle [name] was seen today for hypothyroidism.  Diagnoses and all orders for this visit:  Acquired hypothyroidism- Her TSH is in the normal range.  She will remain on the current dose of levothyroxine. -     TSH; Future  Essential hypertension- Her blood pressure is adequately well controlled.  Electrolytes and renal function are normal. -     CBC with Differential/Platelet; Future -     Basic metabolic panel; Future  Melena- Her H&H are actually higher than they were the last time her CBC was done.  I think the change in stool color is related to the iron supplements.  I have offered her reassurance and advised her that I do not think she needs to take an iron supplement.  She will let me know if the symptoms persist after discontinuation of the iron replacements.   I have discontinued Michelle [name]'s  triamcinolone cream. I am also having her maintain her aspirin, cholecalciferol, vitamin E, betamethasone dipropionate, escitalopram, cetirizine, levothyroxine, simvastatin, torsemide, amitriptyline, and triamcinolone ointment.  No orders of the defined types were placed in this encounter.    Follow-up: Return in about 6 months (around 12/03/2018).  [name], MD

## 2018-06-04 ENCOUNTER — Encounter: Payer: Self-pay | Admitting: Internal Medicine

## 2018-06-09 ENCOUNTER — Encounter: Payer: Self-pay | Admitting: Neurology

## 2018-06-09 ENCOUNTER — Ambulatory Visit: Payer: Medicare HMO | Admitting: Neurology

## 2018-06-09 ENCOUNTER — Other Ambulatory Visit: Payer: Self-pay

## 2018-06-09 VITALS — BP 142/90 | HR 85 | Ht 64.0 in | Wt 122.0 lb

## 2018-06-09 DIAGNOSIS — F09 Unspecified mental disorder due to known physiological condition: Secondary | ICD-10-CM | POA: Diagnosis not present

## 2018-06-09 DIAGNOSIS — F411 Generalized anxiety disorder: Secondary | ICD-10-CM

## 2018-06-09 DIAGNOSIS — R69 Illness, unspecified: Secondary | ICD-10-CM | POA: Diagnosis not present

## 2018-06-09 DIAGNOSIS — F320 Major depressive disorder, single episode, mild: Secondary | ICD-10-CM
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
## 2018-06-09 NOTE — Patient Instructions (Signed)
1. Refer to Psychiatry for mood swings 2. Proceed with Neurocognitive testing as scheduled 3. Follow-up in 6 months, call for any changes   RECOMMENDATIONS FOR ALL PATIENTS WITH MEMORY PROBLEMS: 1. Continue to exercise (Recommend 30 minutes of walking everyday, or 3 hours every week) 2. Increase social interactions - continue going to [place] and enjoy social gatherings with friends and family 3. Eat healthy, avoid fried foods and eat more fruits and vegetables 4. Maintain adequate blood pressure, blood sugar, and blood cholesterol level. Reducing the risk of stroke and cardiovascular disease also helps promoting better memory. 5. Avoid stressful situations. Live a simple life and avoid aggravations. Organize your time and prepare for the next day in anticipation. 6. Sleep well, avoid any interruptions of sleep and avoid any distractions in the bedroom that may interfere with adequate sleep quality 7. Avoid sugar, avoid sweets as there is a strong link between excessive sugar intake, diabetes, and cognitive impairment The Mediterranean diet has been shown to help patients reduce the risk of progressive memory disorders and reduces cardiovascular risk. This includes eating fish, eat fruits and green leafy vegetables, nuts like almonds and hazelnuts, walnuts, and also use olive oil. Avoid fast foods and fried foods as much as possible. Avoid sweets and sugar as sugar use has been linked to worsening of memory function.

## 2018-06-11 ENCOUNTER — Telehealth: Payer: Self-pay | Admitting: Neurology

## 2018-06-11 ENCOUNTER — Telehealth: Payer: Self-pay | Admitting: *Deleted

## 2018-06-11 NOTE — Telephone Encounter (Signed)
Patient called stating to was told to call back and request referral to go somewhere else. Patient does know not where she was referred but patient stated they are to booked.  Please advise

## 2018-06-11 NOTE — Telephone Encounter (Signed)
Patient states that the place where we referred her does not take the [facility] and she wants to talk to someone about

## 2018-06-12 NOTE — Telephone Encounter (Signed)
Spoke with pt.  She could not give me any information.  I asked for clarification due to previous phone encounter.  Asked that pt call [facility] again and schedule.

## 2018-06-13 ENCOUNTER — Telehealth: Payer: Self-pay | Admitting: Internal Medicine

## 2018-06-13 NOTE — Telephone Encounter (Signed)
Copied from [place] [id number]. Topic: Quick Communication - See Telephone Encounter >> Jun 13, 2018  2:05 PM Boyd Kerbs wrote: CRM for notification. See Telephone encounter for: 06/13/18.  [facility] called, pt is saying that Dr. Ronnald Ramp told her at her last visit,  he was prescribing another medicine for thyroids.  She is taking the levothyroxine (SYNTHROID, LEVOTHROID) 50 MCG tablet  [facility] is asking if there should be an other prescription  [name] also was calling about it.  He is requesting a call back to let him know also. [place] may leave a message

## 2018-06-16 NOTE — Telephone Encounter (Signed)
Called [facility] (via detailed message) and [facility]. Informed both that there has been no change in the pt thyroid medication.

## 2018-06-20 ENCOUNTER — Ambulatory Visit
Admission: RE | Admit: 2018-06-20 | Discharge: 2018-06-20 | Disposition: A | Discharge status: Home / Self Care | Payer: Medicare HMO | Source: Ambulatory Visit | Attending: Internal Medicine | Admitting: Internal Medicine

## 2018-06-20 DIAGNOSIS — Z1231 Encounter for screening mammogram for malignant neoplasm of breast: Secondary | ICD-10-CM | POA: Diagnosis not present

## 2018-06-23 ENCOUNTER — Ambulatory Visit: Payer: Medicare HMO | Admitting: Internal Medicine

## 2018-06-23 LAB — HM MAMMOGRAPHY

## 2018-07-09 ENCOUNTER — Other Ambulatory Visit: Payer: Self-pay | Admitting: Internal Medicine

## 2018-07-09 DIAGNOSIS — F320 Major depressive disorder, single episode, mild: Secondary | ICD-10-CM

## 2018-07-09 DIAGNOSIS — F419 Anxiety disorder, unspecified: Secondary | ICD-10-CM

## 2018-07-09 MED ORDER — SERTRALINE HCL 100 MG PO TABS: 100.0000 mg | ORAL_TABLET | Freq: Every day | ORAL | 1 refills | Status: DC

## 2018-07-21 ENCOUNTER — Encounter: Payer: Medicare HMO | Admitting: Psychology

## 2018-07-23 ENCOUNTER — Telehealth: Payer: Self-pay | Admitting: Internal Medicine

## 2018-07-23 NOTE — Telephone Encounter (Signed)
Copied from [place] #[id number]. Topic: General - Other >> Jul 23, 2018 10:21 AM Michelle [name] R wrote: Pt called in and stated that she would like to discuss her results on her mammogram  Cb# [id number] or [phone]

## 2018-07-23 NOTE — Telephone Encounter (Signed)
Pt contacted and informed imaging result was normal.

## 2018-07-24 DIAGNOSIS — J309 Allergic rhinitis, unspecified: Secondary | ICD-10-CM | POA: Diagnosis not present

## 2018-07-24 DIAGNOSIS — G3184 Mild cognitive impairment, so stated: Secondary | ICD-10-CM | POA: Diagnosis not present

## 2018-07-24 DIAGNOSIS — E785 Hyperlipidemia, unspecified: Secondary | ICD-10-CM | POA: Diagnosis not present

## 2018-07-24 DIAGNOSIS — Z7982 Long term (current) use of aspirin: Secondary | ICD-10-CM | POA: Diagnosis not present

## 2018-07-24 DIAGNOSIS — Z720 Tobacco use: Secondary | ICD-10-CM | POA: Diagnosis not present

## 2018-07-24 DIAGNOSIS — I10 Essential (primary) hypertension: Secondary | ICD-10-CM | POA: Diagnosis not present

## 2018-07-24 DIAGNOSIS — R69 Illness, unspecified: Secondary | ICD-10-CM | POA: Diagnosis not present

## 2018-07-24 DIAGNOSIS — E039 Hypothyroidism, unspecified: Secondary | ICD-10-CM | POA: Diagnosis not present

## 2018-07-24 DIAGNOSIS — K08409 Partial loss of teeth, unspecified cause, unspecified class: Secondary | ICD-10-CM | POA: Diagnosis not present

## 2018-07-28 ENCOUNTER — Ambulatory Visit: Payer: Medicare HMO | Admitting: *Deleted

## 2018-07-28 ENCOUNTER — Encounter: Payer: Self-pay | Admitting: Internal Medicine

## 2018-07-28 ENCOUNTER — Ambulatory Visit (INDEPENDENT_AMBULATORY_CARE_PROVIDER_SITE_OTHER): Payer: Medicare HMO | Admitting: Internal Medicine

## 2018-07-28 VITALS — BP 138/73 | HR 58 | Temp 97.9°F | Resp 18 | Ht 64.0 in | Wt 119.0 lb

## 2018-07-28 VITALS — BP 138/76 | HR 60 | Temp 97.9°F | Resp 16 | Wt 119.0 lb

## 2018-07-28 DIAGNOSIS — I10 Essential (primary) hypertension: Secondary | ICD-10-CM

## 2018-07-28 DIAGNOSIS — Z0000 Encounter for general adult medical examination without abnormal findings: Secondary | ICD-10-CM

## 2018-07-28 DIAGNOSIS — E039 Hypothyroidism, unspecified: Secondary | ICD-10-CM | POA: Diagnosis not present

## 2018-07-28 NOTE — Patient Instructions (Signed)
Hypothyroidism Hypothyroidism is a disorder of the thyroid. The thyroid is a large gland that is located in the lower front of the neck. The thyroid releases hormones that control how the body works. With hypothyroidism, the thyroid does not make enough of these hormones. What are the causes? Causes of hypothyroidism may include:  Viral infections.  Pregnancy.  Your own defense system (immune system) attacking your thyroid.  Certain medicines.  Birth defects.  Past radiation treatments to your head or neck.  Past treatment with radioactive iodine.  Past surgical removal of part or all of your thyroid.  Problems with the gland that is located in the center of your brain (pituitary).  What are the signs or symptoms? Signs and symptoms of hypothyroidism may include:  Feeling as though you have no energy (lethargy).  Inability to tolerate cold.  Weight gain that is not explained by a change in diet or exercise habits.  Dry skin.  Coarse hair.  Menstrual irregularity.  Slowing of thought processes.  Constipation.  Sadness or depression.  How is this diagnosed? Your health care provider may diagnose hypothyroidism with blood tests and ultrasound tests. How is this treated? Hypothyroidism is treated with medicine that replaces the hormones that your body does not make. After you begin treatment, it may take several weeks for symptoms to go away. Follow these instructions at home:  Take medicines only as directed by your health care provider.  If you start taking any new medicines, tell your health care provider.  Keep all follow-up visits as directed by your health care provider. This is important. As your condition improves, your dosage needs may change. You will need to have blood tests regularly so that your health care provider can watch your condition. Contact a health care provider if:  Your symptoms do not get better with treatment.  You are taking thyroid  replacement medicine and: ? You sweat excessively. ? You have tremors. ? You feel anxious. ? You lose weight rapidly. ? You cannot tolerate heat. ? You have emotional swings. ? You have diarrhea. ? You feel weak. Get help right away if:  You develop chest pain.  You develop an irregular heartbeat.  You develop a rapid heartbeat. This information is not intended to replace advice given to you by your health care provider. Make sure you discuss any questions you have with your health care provider. Document Released: 12/03/2005 Document Revised: 05/10/2016 Document Reviewed: 04/20/2014 Elsevier Interactive Patient Education  2018 [employer]  
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
## 2018-07-28 NOTE — Patient Instructions (Addendum)
Continue doing brain stimulating activities (puzzles, reading, adult coloring books, staying active) to keep memory sharp.   Continue to eat heart healthy diet (full of fruits, vegetables, whole grains, lean protein, water--limit salt, fat, and sugar intake) and increase physical activity as tolerated.   Michelle [name] , Thank you for taking time to come for your Medicare Wellness Visit. I appreciate your ongoing commitment to your health goals. Please review the following plan we discussed and let me know if I can assist you in the future.   These are the goals we discussed: Goals    . Patient Stated     Stay physically active and eat healthy. Enjoy life, walk outside or walk in the hallway.       This is a list of the screening recommended for you and due dates:  Health Maintenance  Topic Date Due  .  Hepatitis C: One time screening is recommended by Center for Disease Control  (CDC) for  adults born from [birth year] through 1966.   1951-11-01  . Flu Shot  07/17/2018  . Colon Cancer Screening  11/05/2019  . Mammogram  06/23/2020  . Tetanus Vaccine  08/23/2025  . DEXA scan (bone density measurement)  Completed  . Pneumonia vaccines  Completed   Health Maintenance, Female Adopting a healthy lifestyle and getting preventive care can go a long way to promote health and wellness. Talk with your health care provider about what schedule of regular examinations is right for you. This is a good chance for you to check in with your provider about disease prevention and staying healthy. In between checkups, there are plenty of things you can do on your own. Experts have done a lot of research about which lifestyle changes and preventive measures are most likely to keep you healthy. Ask your health care provider for more information. Weight and diet Eat a healthy diet  Be sure to include plenty of vegetables, fruits, low-fat dairy products, and lean protein.  Do not eat a lot of foods high in solid  fats, added sugars, or salt.  Get regular exercise. This is one of the most important things you can do for your health. ? Most adults should exercise for at least 150 minutes each week. The exercise should increase your heart rate and make you sweat (moderate-intensity exercise). ? Most adults should also do strengthening exercises at least twice a week. This is in addition to the moderate-intensity exercise.  Maintain a healthy weight  Body mass index (BMI) is a measurement that can be used to identify possible weight problems. It estimates body fat based on height and weight. Your health care provider can help determine your BMI and help you achieve or maintain a healthy weight.  For females 82 years of age and older: ? A BMI below 18.5 is considered underweight. ? A BMI of 18.5 to 24.9 is normal. ? A BMI of 25 to 29.9 is considered overweight. ? A BMI of 30 and above is considered obese.  Watch levels of cholesterol and blood lipids  You should start having your blood tested for lipids and cholesterol at 68 years of age, then have this test every 5 years.  You may need to have your cholesterol levels checked more often if: ? Your lipid or cholesterol levels are high. ? You are older than 68 years of age. ? You are at high risk for heart disease.  Cancer screening Lung Cancer  Lung cancer screening is recommended for adults 55-80  years old who are at high risk for lung cancer because of a history of smoking.  A yearly low-dose CT scan of the lungs is recommended for people who: ? Currently smoke. ? Have quit within the past 15 years. ? Have at least a 30-pack-year history of smoking. A pack year is smoking an average of one pack of cigarettes a day for 1 year.  Yearly screening should continue until it has been 15 years since you quit.  Yearly screening should stop if you develop a health problem that would prevent you from having lung cancer treatment.  Breast  Cancer  Practice breast self-awareness. This means understanding how your breasts normally appear and feel.  It also means doing regular breast self-exams. Let your health care provider know about any changes, no matter how small.  If you are in your 20s or 30s, you should have a clinical breast exam (CBE) by a health care provider every 1-3 years as part of a regular health exam.  If you are 59 or older, have a CBE every year. Also consider having a breast X-ray (mammogram) every year.  If you have a family history of breast cancer, talk to your health care provider about genetic screening.  If you are at high risk for breast cancer, talk to your health care provider about having an MRI and a mammogram every year.  Breast cancer gene (BRCA) assessment is recommended for women who have family members with BRCA-related cancers. BRCA-related cancers include: ? Breast. ? Ovarian. ? Tubal. ? Peritoneal cancers.  Results of the assessment will determine the need for genetic counseling and BRCA1 and BRCA2 testing.  Cervical Cancer Your health care provider may recommend that you be screened regularly for cancer of the pelvic organs (ovaries, uterus, and vagina). This screening involves a pelvic examination, including checking for microscopic changes to the surface of your cervix (Pap test). You may be encouraged to have this screening done every 3 years, beginning at age 82.  For women ages 51-65, health care providers may recommend pelvic exams and Pap testing every 3 years, or they may recommend the Pap and pelvic exam, combined with testing for human papilloma virus (HPV), every 5 years. Some types of HPV increase your risk of cervical cancer. Testing for HPV may also be done on women of any age with unclear Pap test results.  Other health care providers may not recommend any screening for nonpregnant women who are considered low risk for pelvic cancer and who do not have symptoms. Ask your  health care provider if a screening pelvic exam is right for you.  If you have had past treatment for cervical cancer or a condition that could lead to cancer, you need Pap tests and screening for cancer for at least 20 years after your treatment. If Pap tests have been discontinued, your risk factors (such as having a new sexual partner) need to be reassessed to determine if screening should resume. Some women have medical problems that increase the chance of getting cervical cancer. In these cases, your health care provider may recommend more frequent screening and Pap tests.  Colorectal Cancer  This type of cancer can be detected and often prevented.  Routine colorectal cancer screening usually begins at 68 years of age and continues through 68 years of age.  Your health care provider may recommend screening at an earlier age if you have risk factors for colon cancer.  Your health care provider may also recommend using home  test kits to check for hidden blood in the stool.  A small camera at the end of a tube can be used to examine your colon directly (sigmoidoscopy or colonoscopy). This is done to check for the earliest forms of colorectal cancer.  Routine screening usually begins at age 21.  Direct examination of the colon should be repeated every 5-10 years through 68 years of age. However, you may need to be screened more often if early forms of precancerous polyps or small growths are found.  Skin Cancer  Check your skin from head to toe regularly.  Tell your health care provider about any new moles or changes in moles, especially if there is a change in a mole's shape or color.  Also tell your health care provider if you have a mole that is larger than the size of a pencil eraser.  Always use sunscreen. Apply sunscreen liberally and repeatedly throughout the day.  Protect yourself by wearing long sleeves, pants, a wide-brimmed hat, and sunglasses whenever you are  outside.  Heart disease, diabetes, and high blood pressure  High blood pressure causes heart disease and increases the risk of stroke. High blood pressure is more likely to develop in: ? People who have blood pressure in the high end of the normal range (130-139/85-89 mm Hg). ? People who are overweight or obese. ? People who are African American.  If you are 31-4 years of age, have your blood pressure checked every 3-5 years. If you are 72 years of age or older, have your blood pressure checked every year. You should have your blood pressure measured twice-once when you are at a hospital or clinic, and once when you are not at a hospital or clinic. Record the average of the two measurements. To check your blood pressure when you are not at a hospital or clinic, you can use: ? An automated blood pressure machine at a pharmacy. ? A home blood pressure monitor.  If you are between 36 years and 16 years old, ask your health care provider if you should take aspirin to prevent strokes.  Have regular diabetes screenings. This involves taking a blood sample to check your fasting blood sugar level. ? If you are at a normal weight and have a low risk for diabetes, have this test once every three years after 68 years of age. ? If you are overweight and have a high risk for diabetes, consider being tested at a younger age or more often. Preventing infection Hepatitis B  If you have a higher risk for hepatitis B, you should be screened for this virus. You are considered at high risk for hepatitis B if: ? You were born in a country where hepatitis B is common. Ask your health care provider which countries are considered high risk. ? Your parents were born in a high-risk country, and you have not been immunized against hepatitis B (hepatitis B vaccine). ? You have HIV or AIDS. ? You use needles to inject street drugs. ? You live with someone who has hepatitis B. ? You have had sex with someone who has  hepatitis B. ? You get hemodialysis treatment. ? You take certain medicines for conditions, including cancer, organ transplantation, and autoimmune conditions.  Hepatitis C  Blood testing is recommended for: ? Everyone born from [birth year] through 1966. ? Anyone with known risk factors for hepatitis C.  Sexually transmitted infections (STIs)  You should be screened for sexually transmitted infections (STIs) including gonorrhea and chlamydia  if: ? You are sexually active and are younger than 68 years of age. ? You are older than 68 years of age and your health care provider tells you that you are at risk for this type of infection. ? Your sexual activity has changed since you were last screened and you are at an increased risk for chlamydia or gonorrhea. Ask your health care provider if you are at risk.  If you do not have HIV, but are at risk, it may be recommended that you take a prescription medicine daily to prevent HIV infection. This is called pre-exposure prophylaxis (PrEP). You are considered at risk if: ? You are sexually active and do not regularly use condoms or know the HIV status of your partner(s). ? You take drugs by injection. ? You are sexually active with a partner who has HIV.  Talk with your health care provider about whether you are at high risk of being infected with HIV. If you choose to begin PrEP, you should first be tested for HIV. You should then be tested every 3 months for as long as you are taking PrEP. Pregnancy  If you are premenopausal and you may become pregnant, ask your health care provider about preconception counseling.  If you may become pregnant, take 400 to 800 micrograms (mcg) of folic acid every day.  If you want to prevent pregnancy, talk to your health care provider about birth control (contraception). Osteoporosis and menopause  Osteoporosis is a disease in which the bones lose minerals and strength with aging. This can result in serious bone  fractures. Your risk for osteoporosis can be identified using a bone density scan.  If you are 40 years of age or older, or if you are at risk for osteoporosis and fractures, ask your health care provider if you should be screened.  Ask your health care provider whether you should take a calcium or vitamin D supplement to lower your risk for osteoporosis.  Menopause may have certain physical symptoms and risks.  Hormone replacement therapy may reduce some of these symptoms and risks. Talk to your health care provider about whether hormone replacement therapy is right for you. Follow these instructions at home:  Schedule regular health, dental, and eye exams.  Stay current with your immunizations.  Do not use any tobacco products including cigarettes, chewing tobacco, or electronic cigarettes.  If you are pregnant, do not drink alcohol.  If you are breastfeeding, limit how much and how often you drink alcohol.  Limit alcohol intake to no more than 1 drink per day for nonpregnant women. One drink equals 12 ounces of beer, 5 ounces of Michelle [name], or 1 ounces of hard liquor.  Do not use street drugs.  Do not share needles.  Ask your health care provider for help if you need support or information about quitting drugs.  Tell your health care provider if you often feel depressed.  Tell your health care provider if you have ever been abused or do not feel safe at home. This information is not intended to replace advice given to you by your health care provider. Make sure you discuss any questions you have with your health care provider. Document Released: 06/18/2011 Document Revised: 05/10/2016 Document Reviewed: 09/06/2015 Elsevier Interactive Patient Education  [employer].

## 2018-07-28 NOTE — Progress Notes (Signed)
Subjective:  Patient ID: Michelle [name], female    DOB: [birth year]  Age: 67 y.o. MRN: [id number]  CC: Hypothyroidism   HPI Michelle [name] presents for f/up - She feels well on the current dose of levothyroxine.  Her last TSH, done about 2 months ago, was normal at 0.42.  She has intentionally lost a few pounds recently.  She denies abdominal pain, constipation, edema, or fatigue.  Outpatient Medications Prior to Visit  Medication Sig Dispense Refill  . amitriptyline (ELAVIL) 50 MG tablet Take 1 tablet (50 mg total) by mouth at bedtime. 90 tablet 1  . betamethasone dipropionate (DIPROLENE) 0.05 % cream APPLY ON THE SKIN TWICE A DAY AS NEEDED FOR AREAS ON BODY  2  . cetirizine (ZYRTEC) 10 MG tablet Take 1 tablet (10 mg total) by mouth 2 (two) times daily. 180 tablet 1  . cholecalciferol (VITAMIN D) 1000 UNITS tablet Take 2,000 Units by mouth daily.    Marland Kitchen levothyroxine (SYNTHROID, LEVOTHROID) 50 MCG tablet Take 1 tablet (50 mcg total) by mouth daily. 90 tablet 1  . sertraline (ZOLOFT) 100 MG tablet Take 1 tablet (100 mg total) by mouth daily. 90 tablet 1  . simvastatin (ZOCOR) 10 MG tablet Take 1 tablet (10 mg total) by mouth daily. 90 tablet 1  . torsemide (DEMADEX) 10 MG tablet TAKE 1 TABLET BY MOUTH EVERY DAY 90 tablet 1  . triamcinolone ointment (KENALOG) 0.1 % 1 APPLICATION APPLY ON THE SKIN THREE TIMES A DAY PT TO USE 2-3 TIMES DAILY.  2  . aspirin 81 MG tablet Take 81 mg by mouth daily.      . vitamin E 1000 UNIT capsule Take 1,000 Units by mouth daily.     No facility-administered medications prior to visit.     ROS Review of Systems  Constitutional: Negative for diaphoresis, fatigue and unexpected weight change.  HENT: Negative.   Eyes: Negative for visual disturbance.  Respiratory: Negative for cough, chest tightness, shortness of breath and wheezing.   Cardiovascular: Negative for chest pain, palpitations and leg swelling.  Gastrointestinal: Negative for abdominal pain,  constipation, diarrhea, nausea and vomiting.  Endocrine: Negative for cold intolerance and heat intolerance.  Genitourinary: Negative.  Negative for difficulty urinating.  Musculoskeletal: Negative.  Negative for arthralgias and myalgias.  Skin: Negative.   Neurological: Negative.  Negative for dizziness and light-headedness.  Hematological: Negative for adenopathy. Does not bruise/bleed easily.  Psychiatric/Behavioral: Negative.     Objective:  BP 138/76   Pulse 60   Temp 97.9 F (36.6 C) (Oral)   Resp 16   Wt 119 lb (54 kg)   SpO2 98%   BMI 20.43 kg/m   BP Readings from Last 3 Encounters:  07/28/18 138/76  07/28/18 138/73  06/09/18 (!) 142/90    Wt Readings from Last 3 Encounters:  07/28/18 119 lb (54 kg)  07/28/18 119 lb (54 kg)  06/09/18 122 lb (55.3 kg)    Physical Exam  Constitutional: No distress.  HENT:  Mouth/Throat: Oropharynx is clear and moist. No oropharyngeal exudate.  Eyes: Conjunctivae are normal. No scleral icterus.  Neck: Normal range of motion. Neck supple. No JVD present. No thyromegaly present.  Cardiovascular: Normal rate, regular rhythm and normal heart sounds. Exam reveals no gallop.  No murmur heard. Pulmonary/Chest: Effort normal and breath sounds normal. No respiratory distress. She has no wheezes. She has no rhonchi. She has no rales.  Abdominal: Soft. Normal appearance and bowel sounds are normal. She exhibits no mass.  There is no hepatosplenomegaly. There is no tenderness.  Musculoskeletal: Normal range of motion. She exhibits edema (trace pitting edema BLE). She exhibits no tenderness or deformity.  Lymphadenopathy:    She has no cervical adenopathy.  Neurological: She is alert.  Skin: Skin is warm and dry. She is not diaphoretic. No pallor.  Vitals reviewed.   Lab Results  Component Value Date   WBC 7.6 06/03/2018   HGB 13.0 06/03/2018   HCT 38.8 06/03/2018   PLT 279.0 06/03/2018   GLUCOSE 88 06/03/2018   CHOL 167 01/28/2018    TRIG 127.0 01/28/2018   HDL 31.50 (L) 01/28/2018   LDLDIRECT 113 (H) 06/16/2008   LDLCALC 110 (H) 01/28/2018   ALT 7 01/28/2018   AST 19 01/28/2018   NA 139 06/03/2018   K 3.9 06/03/2018   CL 101 06/03/2018   CREATININE 1.19 06/03/2018   BUN 16 06/03/2018   CO2 31 06/03/2018   TSH 0.42 06/03/2018   INR 1.10 08/07/2017    Mm 3d Screen Breast Bilateral  Result Date: 06/23/2018 CLINICAL DATA:  Screening. EXAM: DIGITAL SCREENING BILATERAL MAMMOGRAM WITH TOMO AND CAD COMPARISON:  Previous exam(s). ACR Breast Density Category c: The breast tissue is heterogeneously dense, which may obscure small masses. FINDINGS: There are no findings suspicious for malignancy. Images were processed with CAD. IMPRESSION: No mammographic evidence of malignancy. A result letter of this screening mammogram will be mailed directly to the patient. RECOMMENDATION: Screening mammogram in one year. (Code:SM-B-01Y) BI-RADS CATEGORY  1: Negative. Electronically Signed   By: Curlene Dolphin M.D.   On: 06/23/2018 12:46    Assessment & Plan:   Michelle [name] was seen today for hypothyroidism.  Diagnoses and all orders for this visit:  Essential hypertension- Her edema is well controlled.  Her blood pressure is also well controlled.  Will continue the loop diuretic at the current dose.  Acquired hypothyroidism- Will continue the current dose of levothyroxine.   I have discontinued Bonnita Nasuti I. Lamphier's aspirin and vitamin E. I am also having her maintain her cholecalciferol, betamethasone dipropionate, cetirizine, levothyroxine, simvastatin, torsemide, amitriptyline, triamcinolone ointment, and sertraline.  No orders of the defined types were placed in this encounter.    Follow-up: Return in about 4 months (around 11/27/2018).  [name], MD

## 2018-08-04 ENCOUNTER — Encounter: Payer: Self-pay | Admitting: Psychology

## 2018-08-04 ENCOUNTER — Ambulatory Visit: Payer: Medicare HMO | Admitting: Psychology

## 2018-08-04 ENCOUNTER — Ambulatory Visit (INDEPENDENT_AMBULATORY_CARE_PROVIDER_SITE_OTHER): Payer: Medicare HMO | Admitting: Psychology

## 2018-08-04 DIAGNOSIS — R69 Illness, unspecified: Secondary | ICD-10-CM | POA: Diagnosis not present

## 2018-08-04 DIAGNOSIS — S060X0S Concussion without loss of consciousness, sequela: Secondary | ICD-10-CM | POA: Diagnosis not present

## 2018-08-04 DIAGNOSIS — F09 Unspecified mental disorder due to known physiological condition: Secondary | ICD-10-CM
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
## 2018-08-04 NOTE — Progress Notes (Signed)
   Neuropsychology Note  Michelle [name] completed 60 minutes of neuropsychological testing with technician, Milana Kidney, BS, under the supervision of Dr. Macarthur Critchley, Licensed Psychologist. The patient did not appear overtly distressed by the testing session, per behavioral observation or via self-report to the technician. Rest breaks were offered.   Clinical Decision Making: In considering the patient's current level of functioning, level of presumed impairment, nature of symptoms, emotional and behavioral responses during the interview, level of literacy, and observed level of motivation/effort, a battery of tests was selected and communicated to the psychometrician.  Communication between the psychologist and technician was ongoing throughout the testing session and changes were made as deemed necessary based on patient performance on testing, technician observations and additional pertinent factors such as those listed above.  Michelle [name] will return within approximately 2 weeks for an interactive feedback session with Dr. Si Raider at which time her test performances, clinical impressions and treatment recommendations will be reviewed in detail. The patient understands she can contact our office should she require our assistance before this time.  35 minutes spent performing neuropsychological evaluation services/clinical decision making (psychologist). [CPT 12197] 60 minutes spent face-to-face with patient administering standardized tests, 30 minutes spent scoring (technician). [CPT [id number], 58832]  Full report to follow.

## 2018-08-05 ENCOUNTER — Encounter: Payer: Medicare HMO | Admitting: Psychology

## 2018-08-06 ENCOUNTER — Ambulatory Visit: Payer: Medicare HMO
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
## 2018-08-14 ENCOUNTER — Encounter: Payer: Self-pay | Admitting: Psychology

## 2018-08-14 ENCOUNTER — Ambulatory Visit (INDEPENDENT_AMBULATORY_CARE_PROVIDER_SITE_OTHER): Payer: Medicare HMO | Admitting: Psychology

## 2018-08-14 DIAGNOSIS — F0150 Vascular dementia without behavioral disturbance: Secondary | ICD-10-CM

## 2018-08-14 DIAGNOSIS — F0390 Unspecified dementia without behavioral disturbance: Secondary | ICD-10-CM

## 2018-08-14 DIAGNOSIS — S060X0S Concussion without loss of consciousness, sequela: Secondary | ICD-10-CM

## 2018-10-10 ENCOUNTER — Telehealth: Payer: Self-pay | Admitting: Psychology

## 2018-10-10 NOTE — Telephone Encounter (Signed)
Hi Dr. Sherrin Daisy called regarding having misplaced her paperwork that you had mail to her. She was unsure what exactly it was. She thinks maybe a copy of her evaluation. Please Call.  Thank you

## 2018-10-14 ENCOUNTER — Other Ambulatory Visit: Payer: Self-pay | Admitting: Internal Medicine

## 2018-10-14 DIAGNOSIS — R7989 Other specified abnormal findings of blood chemistry: Secondary | ICD-10-CM

## 2018-10-14 DIAGNOSIS — E039 Hypothyroidism, unspecified: Secondary | ICD-10-CM

## 2018-10-14 NOTE — Telephone Encounter (Signed)
Mailed patient another copy of her report.

## 2018-10-29 ENCOUNTER — Other Ambulatory Visit: Payer: Self-pay | Admitting: Internal Medicine

## 2018-10-29 DIAGNOSIS — Q828 Other specified congenital malformations of skin: Secondary | ICD-10-CM

## 2018-10-29 DIAGNOSIS — E785 Hyperlipidemia, unspecified: Secondary | ICD-10-CM

## 2018-11-18 ENCOUNTER — Other Ambulatory Visit: Payer: Self-pay | Admitting: Internal Medicine

## 2018-11-18 DIAGNOSIS — G5791 Unspecified mononeuropathy of right lower limb: Secondary | ICD-10-CM

## 2018-12-08 ENCOUNTER — Ambulatory Visit: Payer: Medicare HMO | Admitting: Physician Assistant

## 2018-12-09 ENCOUNTER — Ambulatory Visit: Payer: Medicare HMO

## 2018-12-09 ENCOUNTER — Ambulatory Visit: Payer: Self-pay | Admitting: *Deleted

## 2018-12-09 ENCOUNTER — Emergency Department (HOSPITAL_COMMUNITY): Payer: Medicare HMO

## 2018-12-09 ENCOUNTER — Ambulatory Visit (INDEPENDENT_AMBULATORY_CARE_PROVIDER_SITE_OTHER): Payer: Medicare HMO | Admitting: Physician Assistant

## 2018-12-09 ENCOUNTER — Encounter: Payer: Self-pay | Admitting: Physician Assistant

## 2018-12-09 ENCOUNTER — Encounter (HOSPITAL_COMMUNITY): Payer: Self-pay | Admitting: Emergency Medicine

## 2018-12-09 ENCOUNTER — Emergency Department (HOSPITAL_COMMUNITY)
Admission: EM | Admit: 2018-12-09 | Discharge: 2018-12-09 | Disposition: A | Discharge status: Home / Self Care | Payer: Medicare HMO | Attending: Emergency Medicine | Admitting: Emergency Medicine

## 2018-12-09 VITALS — BP 110/70 | HR 82 | Ht 64.0 in | Wt 114.0 lb

## 2018-12-09 DIAGNOSIS — R69 Illness, unspecified: Secondary | ICD-10-CM | POA: Diagnosis not present

## 2018-12-09 DIAGNOSIS — Z79899 Other long term (current) drug therapy: Secondary | ICD-10-CM | POA: Diagnosis not present

## 2018-12-09 DIAGNOSIS — I10 Essential (primary) hypertension: Secondary | ICD-10-CM | POA: Insufficient documentation

## 2018-12-09 DIAGNOSIS — R05 Cough: Secondary | ICD-10-CM

## 2018-12-09 DIAGNOSIS — R059 Cough, unspecified: Secondary | ICD-10-CM

## 2018-12-09 DIAGNOSIS — Z7982 Long term (current) use of aspirin: Secondary | ICD-10-CM | POA: Diagnosis not present

## 2018-12-09 DIAGNOSIS — J069 Acute upper respiratory infection, unspecified: Secondary | ICD-10-CM | POA: Diagnosis not present

## 2018-12-09 DIAGNOSIS — F17200 Nicotine dependence, unspecified, uncomplicated: Secondary | ICD-10-CM | POA: Diagnosis not present

## 2018-12-09 DIAGNOSIS — R0602 Shortness of breath: Secondary | ICD-10-CM | POA: Diagnosis not present

## 2018-12-09 LAB — CBC WITH DIFFERENTIAL/PLATELET
Abs Immature Granulocytes: 0.02 10*3/uL (ref 0.00–0.07)
Basophils Absolute: 0.1 10*3/uL (ref 0.0–0.1)
Basophils Relative: 1 %
Eosinophils Absolute: 0.2 10*3/uL (ref 0.0–0.5)
Eosinophils Relative: 3 %
HCT: 43.6 % (ref 36.0–46.0)
Hemoglobin: 13.6 g/dL (ref 12.0–15.0)
Immature Granulocytes: 0 %
Lymphocytes Relative: 23 %
Lymphs Abs: 1.7 10*3/uL (ref 0.7–4.0)
MCH: 29.6 pg (ref 26.0–34.0)
MCHC: 31.2 g/dL (ref 30.0–36.0)
MCV: 94.8 fL (ref 80.0–100.0)
MONOS PCT: 7 %
Monocytes Absolute: 0.5 10*3/uL (ref 0.1–1.0)
Neutro Abs: 4.8 10*3/uL (ref 1.7–7.7)
Neutrophils Relative %: 66 %
PLATELETS: 185 10*3/uL (ref 150–400)
RBC: 4.6 MIL/uL (ref 3.87–5.11)
RDW: 13.2 % (ref 11.5–15.5)
WBC: 7.2 10*3/uL (ref 4.0–10.5)
nRBC: 0 % (ref 0.0–0.2)

## 2018-12-09 LAB — BASIC METABOLIC PANEL
Anion gap: 10 (ref 5–15)
BUN: 16 mg/dL (ref 8–23)
CHLORIDE: 103 mmol/L (ref 98–111)
CO2: 29 mmol/L (ref 22–32)
Calcium: 8.9 mg/dL (ref 8.9–10.3)
Creatinine, Ser: 0.89 mg/dL (ref 0.44–1.00)
GFR calc Af Amer: >60 mL/min (ref >60)
GFR calc non Af Amer: >60 mL/min (ref >60)
Glucose, Bld: 104 mg/dL — ABNORMAL HIGH (ref 70–99)
Potassium: 3.7 mmol/L (ref 3.5–5.1)
Sodium: 142 mmol/L (ref 135–145)

## 2018-12-09 LAB — POCT INFLUENZA A/B
INFLUENZA B, POC: NEGATIVE
Influenza A, POC: NEGATIVE

## 2018-12-09 MED ORDER — DOXYCYCLINE HYCLATE 100 MG PO CAPS: 100.0000 mg | ORAL_CAPSULE | Freq: Two times a day (BID) | ORAL | 0 refills | Status: AC

## 2018-12-09 MED ORDER — IPRATROPIUM-ALBUTEROL 0.5-2.5 (3) MG/3ML IN SOLN
3.0000 mL | Freq: Once | RESPIRATORY_TRACT | Status: AC
  Administered 2018-12-09: 3 mL via RESPIRATORY_TRACT
  Filled 2018-12-09: qty 3

## 2018-12-09 MED ORDER — ALBUTEROL SULFATE HFA 108 (90 BASE) MCG/ACT IN AERS
2.0000 | INHALATION_SPRAY | Freq: Once | RESPIRATORY_TRACT | Status: AC
  Administered 2018-12-09: 2 via RESPIRATORY_TRACT
  Filled 2018-12-09: qty 6.7

## 2018-12-09 NOTE — ED Notes (Signed)
Unable to establish IV at this time. Labs drawn and sent. EDP notified.

## 2018-12-09 NOTE — ED Notes (Signed)
Pt continues to wait for her ride. Moved to hall bed.
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
## 2018-12-09 NOTE — ED Triage Notes (Signed)
For about week to 10 days pt has cough that is productive sometimes white with pink tinge and fatigue.  Prednisone was tried by PCP but pt broke out in rash.  Pt c/o head, throat and chest pains.

## 2018-12-09 NOTE — ED Notes (Signed)
Bed: [facility] Expected date:  Expected time:  Means of arrival:  Comments: EMS-fall 

## 2018-12-09 NOTE — ED Notes (Signed)
Pt calling husband to pick her up.

## 2018-12-09 NOTE — Patient Instructions (Addendum)
Please go to the ER for further work-up.  I hope you start feeling better soon!

## 2018-12-09 NOTE — ED Notes (Signed)
Pt SO is on his way to pick her up.

## 2018-12-09 NOTE — Discharge Instructions (Signed)
Follow up with primary doctor, return if symptoms worsen.

## 2018-12-09 NOTE — Telephone Encounter (Signed)
Pt states just released from ED for URI. Placed on doxycycline. Pt calling to question if she could take a cough suppressant. Recommended pt speak with pharmacist, states "All are closed and I'm not going out again." States has generic '[place]" syrup that states is a suppressant. Reviewed medications; informed the medication would be appropriate. Instructed to be sure to take antibiotic as ordered. Pt verbalizes understanding.  Answer Assessment - Initial Assessment Questions 1. SYMPTOMS: "Do you have any symptoms?"     Cough 2. SEVERITY: If symptoms are present, ask "Are they mild, moderate or severe?"     no  Protocols used: MEDICATION QUESTION CALL-A-AH

## 2018-12-16 ENCOUNTER — Ambulatory Visit: Payer: Self-pay

## 2018-12-16 NOTE — Telephone Encounter (Signed)
Pt called to state that she has cough. She was seen in ED  On Dec 24th and given doxycycline. She denies fever.  She has continued soore throat and nasal drainage when she coughs hard. Per protocol pt was advised to go to urgent care or ED for re evaluation of her symptoms. Care advice read to patient. Pt verbalized understanding. Reason for Disposition . [1] Continuous (nonstop) coughing interferes with work or school AND [2] no improvement using cough treatment per Care Advice  Answer Assessment - Initial Assessment Questions 1. ONSET: "When did the cough begin?"      2 weeks 2. SEVERITY: "How bad is the cough today?"      severe 3. RESPIRATORY DISTRESS: "Describe your breathing."      Ok sometimes short 4. FEVER: "Do you have a fever?" If so, ask: "What is your temperature, how was it measured, and when did it start?"     no 5. SPUTUM: "Describe the color of your sputum" (clear, white, yellow, green)     clear 6. HEMOPTYSIS: "Are you coughing up any blood?" If so ask: "How much?" (flecks, streaks, tablespoons, etc.)     Some flecks 7. CARDIAC HISTORY: "Do you have any history of heart disease?" (e.g., heart attack, congestive heart failure)      Open heart 8. LUNG HISTORY: "Do you have any history of lung disease?"  (e.g., pulmonary embolus, asthma, emphysema)     no 9. PE RISK FACTORS: "Do you have a history of blood clots?" (or: recent major surgery, recent prolonged travel, bedridden)     no 10. OTHER SYMPTOMS: "Do you have any other symptoms?" (e.g., runny nose, wheezing, chest pain)       Runny nose 11. PREGNANCY: "Is there any chance you are pregnant?" "When was your last menstrual period?"       N/A 12. TRAVEL: "Have you traveled out of the country in the last month?" (e.g., travel history, exposures)       No  Protocols used: [place]

## 2018-12-17 DIAGNOSIS — J209 Acute bronchitis, unspecified: Secondary | ICD-10-CM | POA: Diagnosis not present

## 2019-01-01 ENCOUNTER — Telehealth: Payer: Self-pay

## 2019-01-01 NOTE — Telephone Encounter (Signed)
Key: [id number]  This has been approved, will you inform pt of same.

## 2019-01-01 NOTE — Telephone Encounter (Signed)
Pt informed

## 2019-01-07 ENCOUNTER — Ambulatory Visit (INDEPENDENT_AMBULATORY_CARE_PROVIDER_SITE_OTHER)
Admission: RE | Admit: 2019-01-07 | Discharge: 2019-01-07 | Disposition: A | Discharge status: Home / Self Care | Payer: Medicare HMO | Source: Ambulatory Visit | Attending: Internal Medicine | Admitting: Internal Medicine

## 2019-01-07 ENCOUNTER — Encounter: Payer: Self-pay | Admitting: Internal Medicine

## 2019-01-07 ENCOUNTER — Ambulatory Visit (INDEPENDENT_AMBULATORY_CARE_PROVIDER_SITE_OTHER): Payer: Medicare HMO | Admitting: Internal Medicine

## 2019-01-07 ENCOUNTER — Encounter

## 2019-01-07 VITALS — BP 140/90 | HR 74 | Temp 97.9°F | Ht 64.0 in | Wt 115.0 lb

## 2019-01-07 DIAGNOSIS — R059 Cough, unspecified: Secondary | ICD-10-CM

## 2019-01-07 DIAGNOSIS — J069 Acute upper respiratory infection, unspecified: Secondary | ICD-10-CM | POA: Diagnosis not present

## 2019-01-07 DIAGNOSIS — R05 Cough: Secondary | ICD-10-CM

## 2019-01-07 DIAGNOSIS — R079 Chest pain, unspecified: Secondary | ICD-10-CM | POA: Diagnosis not present

## 2019-01-07 DIAGNOSIS — B9789 Other viral agents as the cause of diseases classified elsewhere: Secondary | ICD-10-CM

## 2019-01-07 MED ORDER — DEXTROMETHORPHAN POLISTIREX ER 30 MG/5ML PO SUER: 30.0000 mg | Freq: Two times a day (BID) | ORAL | 1 refills | Status: DC

## 2019-01-07 NOTE — Patient Instructions (Signed)
Cough, Adult  Coughing is a reflex that clears your throat and your airways. Coughing helps to heal and protect your lungs. It is normal to cough occasionally, but a cough that happens with other symptoms or lasts a long time may be a sign of a condition that needs treatment. A cough may last only 2-3 weeks (acute), or it may last longer than 8 weeks (chronic). What are the causes? Coughing is commonly caused by:  Breathing in substances that irritate your lungs.  A viral or bacterial respiratory infection.  Allergies.  Asthma.  Postnasal drip.  Smoking.  Acid backing up from the stomach into the esophagus (gastroesophageal reflux).  Certain medicines.  Chronic lung problems, including COPD (or rarely, lung cancer).  Other medical conditions such as heart failure. Follow these instructions at home: Pay attention to any changes in your symptoms. Take these actions to help with your discomfort:  Take medicines only as told by your health care provider. ? If you were prescribed an antibiotic medicine, take it as told by your health care provider. Do not stop taking the antibiotic even if you start to feel better. ? Talk with your health care provider before you take a cough suppressant medicine.  Drink enough fluid to keep your urine clear or pale yellow.  If the air is dry, use a cold steam vaporizer or humidifier in your bedroom or your home to help loosen secretions.  Avoid anything that causes you to cough at work or at home.  If your cough is worse at night, try sleeping in a semi-upright position.  Avoid cigarette smoke. If you smoke, quit smoking. If you need help quitting, ask your health care provider.  Avoid caffeine.  Avoid alcohol.  Rest as needed. Contact a health care provider if:  You have new symptoms.  You cough up pus.  Your cough does not get better after 2-3 weeks, or your cough gets worse.  You cannot control your cough with suppressant  medicines and you are losing sleep.  You develop pain that is getting worse or pain that is not controlled with pain medicines.  You have a fever.  You have unexplained weight loss.  You have night sweats. Get help right away if:  You cough up blood.  You have difficulty breathing.  Your heartbeat is very fast. This information is not intended to replace advice given to you by your health care provider. Make sure you discuss any questions you have with your health care provider. Document Released: 06/01/2011 Document Revised: 05/10/2016 Document Reviewed: 02/09/2015 Elsevier Interactive Patient Education  2019 [employer] Inc[employer]  

## 2019-01-07 NOTE — Progress Notes (Signed)
Subjective:  Patient ID: Michelle [name], female    DOB: Nov 27, 1951  Age: 68 y.o. MRN: [id number]  CC: Cough   HPI Michelle [name] presents for a one-week history of cough that is productive of white phlegm with sore throat and laryngitis.  She tells me the symptoms are slowly improving.  Outpatient Medications Prior to Visit  Medication Sig Dispense Refill  . amitriptyline (ELAVIL) 50 MG tablet TAKE 1 TABLET BY MOUTH EVERYDAY AT BEDTIME (Patient taking differently: Take 50 mg by mouth at bedtime as needed for sleep. ) 90 tablet 1  . aspirin 81 MG chewable tablet Chew 81 mg by mouth daily.    . betamethasone dipropionate (DIPROLENE) 0.05 % cream APPLY ON THE SKIN TWICE A DAY AS NEEDED FOR AREAS ON BODY  2  . cetirizine (ZYRTEC) 10 MG tablet TAKE 1 TABLET BY MOUTH TWICE A DAY 180 tablet 1  . cholecalciferol (VITAMIN D) 1000 UNITS tablet Take 1,000 Units by mouth daily.     . Ginkgo Biloba 40 MG TABS Take 1 tablet by mouth daily.    Marland Kitchen levothyroxine (SYNTHROID, LEVOTHROID) 50 MCG tablet Take 1 tablet (50 mcg total) by mouth daily. 90 tablet 1  . Multiple Vitamin (MULTIVITAMIN) tablet Take 1 tablet by mouth daily.    . Omega-3 Fatty Acids (FISH OIL) 1000 MG CAPS Take 2 capsules by mouth daily.     . sertraline (ZOLOFT) 100 MG tablet Take 1 tablet (100 mg total) by mouth daily. 90 tablet 1  . simvastatin (ZOCOR) 10 MG tablet TAKE 1 TABLET BY MOUTH EVERY DAY 90 tablet 1  . torsemide (DEMADEX) 10 MG tablet TAKE 1 TABLET BY MOUTH EVERY DAY 90 tablet 1  . triamcinolone ointment (KENALOG) 0.1 % 1 APPLICATION APPLY ON THE SKIN THREE TIMES A DAY PT TO USE 2-3 TIMES DAILY.  2   No facility-administered medications prior to visit.     ROS Review of Systems  Constitutional: Negative for chills, diaphoresis, fatigue and fever.  HENT: Positive for sore throat. Negative for facial swelling, sinus pressure, trouble swallowing and voice change.   Respiratory: Positive for cough. Negative for chest  tightness, shortness of breath and wheezing.   Cardiovascular: Negative for chest pain, palpitations and leg swelling.  Gastrointestinal: Negative for abdominal pain, constipation, diarrhea, nausea and vomiting.  Endocrine: Negative.   Genitourinary: Negative.  Negative for difficulty urinating.  Musculoskeletal: Negative.  Negative for arthralgias and myalgias.  Skin: Negative for color change and rash.  Neurological: Negative.  Negative for dizziness, weakness and headaches.  Hematological: Negative for adenopathy. Does not bruise/bleed easily.  Psychiatric/Behavioral: Negative.     Objective:  BP 140/90 (BP Location: Left Arm, Patient Position: Sitting, Cuff Size: Normal)   Pulse 74   Temp 97.9 F (36.6 C) (Oral)   Ht 5\' 4"  (1.626 m)   Wt 115 lb (52.2 kg)   SpO2 96%   BMI 19.74 kg/m   BP Readings from Last 3 Encounters:  01/07/19 140/90  12/09/18 139/78  12/09/18 110/70    Wt Readings from Last 3 Encounters:  01/07/19 115 lb (52.2 kg)  12/09/18 114 lb (51.7 kg)  07/28/18 119 lb (54 kg)    Physical Exam Vitals signs reviewed.  Constitutional:      General: She is not in acute distress.    Appearance: She is not ill-appearing, toxic-appearing or diaphoretic.  HENT:     Nose: Nose normal. No congestion or rhinorrhea.     Mouth/Throat:  Mouth: Mucous membranes are moist.     Pharynx: Oropharynx is clear. No oropharyngeal exudate or posterior oropharyngeal erythema.  Eyes:     General: No scleral icterus.    Conjunctiva/sclera: Conjunctivae normal.  Neck:     Musculoskeletal: Normal range of motion.  Cardiovascular:     Rate and Rhythm: Normal rate and regular rhythm.     Heart sounds: No murmur. No friction rub. No gallop.   Pulmonary:     Effort: Pulmonary effort is normal.     Breath sounds: No stridor. No decreased breath sounds, wheezing, rhonchi or rales.  Abdominal:     General: Bowel sounds are normal.     Palpations: There is no hepatomegaly,  splenomegaly or mass.     Tenderness: There is no abdominal tenderness.  Musculoskeletal:        General: No swelling.  Lymphadenopathy:     Cervical: No cervical adenopathy.  Skin:    General: Skin is warm and dry.  Neurological:     General: No focal deficit present.     Mental Status: She is oriented to person, place, and time. Mental status is at baseline.     Lab Results  Component Value Date   WBC 7.2 12/09/2018   HGB 13.6 12/09/2018   HCT 43.6 12/09/2018   PLT 185 12/09/2018   GLUCOSE 104 (H) 12/09/2018   CHOL 167 01/28/2018   TRIG 127.0 01/28/2018   HDL 31.50 (L) 01/28/2018   LDLDIRECT 113 (H) 06/16/2008   LDLCALC 110 (H) 01/28/2018   ALT 7 01/28/2018   AST 19 01/28/2018   NA 142 12/09/2018   K 3.7 12/09/2018   CL 103 12/09/2018   CREATININE 0.89 12/09/2018   BUN 16 12/09/2018   CO2 29 12/09/2018   TSH 0.42 06/03/2018   INR 1.10 08/07/2017    Dg Chest 2 View  Result Date: 01/07/2019 CLINICAL DATA:  Cough. Bronchitis with left anterior chest pain. Ex-smoker. EXAM: CHEST - 2 VIEW COMPARISON:  On 12/09/2018 radiographs. FINDINGS: The heart size and mediastinal contours are stable status post median sternotomy for reported repair of tetralogy of Fallot. There are tricuspid and pulmonic prosthetic valves. The lungs are clear. There is no pleural effusion or pneumothorax. No acute osseous findings are evident. There is a mild convex right thoracic scoliosis. IMPRESSION: Stable postoperative chest.  No acute cardiopulmonary process. Electronically Signed   By: Richardean Sale M.D.   On: 01/07/2019 15:56    Assessment & Plan:   Michelle [name] was seen today for cough.  Diagnoses and all orders for this visit:  Cough - Her chest x-ray is negative for mass or infiltrate. -     DG Chest 2 View; Future  Viral URI with cough -     dextromethorphan (DELSYM) 30 MG/5ML liquid; Take 5 mLs (30 mg total) by mouth 2 (two) times daily.   I am having Michelle [name] start on  dextromethorphan. I am also having her maintain her cholecalciferol, betamethasone dipropionate, levothyroxine, torsemide, triamcinolone ointment, sertraline, simvastatin, cetirizine, amitriptyline, aspirin, multivitamin, Ginkgo Biloba, and Fish Oil.  Meds ordered this encounter  Medications  . dextromethorphan (DELSYM) 30 MG/5ML liquid    Sig: Take 5 mLs (30 mg total) by mouth 2 (two) times daily.    Dispense:  148 mL    Refill:  1     Follow-up: Return in about 3 weeks (around 01/28/2019).  [name], MD

## 2019-01-08 DIAGNOSIS — R609 Edema, unspecified: Secondary | ICD-10-CM | POA: Diagnosis not present

## 2019-01-08 DIAGNOSIS — Z594 Lack of adequate food and safe drinking water: Secondary | ICD-10-CM | POA: Diagnosis not present

## 2019-01-08 DIAGNOSIS — I10 Essential (primary) hypertension: Secondary | ICD-10-CM | POA: Diagnosis not present

## 2019-01-08 DIAGNOSIS — Z720 Tobacco use: Secondary | ICD-10-CM | POA: Diagnosis not present

## 2019-01-08 DIAGNOSIS — F419 Anxiety disorder, unspecified: Secondary | ICD-10-CM | POA: Diagnosis not present

## 2019-01-08 DIAGNOSIS — E785 Hyperlipidemia, unspecified: Secondary | ICD-10-CM | POA: Diagnosis not present

## 2019-01-08 DIAGNOSIS — J309 Allergic rhinitis, unspecified: Secondary | ICD-10-CM | POA: Diagnosis not present

## 2019-01-08 DIAGNOSIS — Z596 Low income: Secondary | ICD-10-CM | POA: Diagnosis not present

## 2019-01-08 DIAGNOSIS — R69 Illness, unspecified: Secondary | ICD-10-CM | POA: Diagnosis not present

## 2019-01-08 DIAGNOSIS — Z880 Allergy status to penicillin: Secondary | ICD-10-CM | POA: Diagnosis not present

## 2019-01-20 ENCOUNTER — Ambulatory Visit (INDEPENDENT_AMBULATORY_CARE_PROVIDER_SITE_OTHER): Payer: Medicare HMO | Admitting: Neurology

## 2019-01-20 ENCOUNTER — Encounter

## 2019-01-20 ENCOUNTER — Encounter: Payer: Self-pay | Admitting: Neurology

## 2019-01-20 ENCOUNTER — Other Ambulatory Visit: Payer: Self-pay

## 2019-01-20 VITALS — BP 110/74 | HR 64 | Ht 64.0 in | Wt 118.0 lb

## 2019-01-20 DIAGNOSIS — F0150 Vascular dementia without behavioral disturbance: Secondary | ICD-10-CM | POA: Diagnosis not present

## 2019-01-20 DIAGNOSIS — F0390 Unspecified dementia without behavioral disturbance: Secondary | ICD-10-CM

## 2019-01-20 DIAGNOSIS — R69 Illness, unspecified: Secondary | ICD-10-CM | POA: Diagnosis not present

## 2019-01-20 MED ORDER — DONEPEZIL HCL 10 MG PO TABS: ORAL_TABLET | ORAL | 11 refills | Status: DC

## 2019-01-20 NOTE — Patient Instructions (Addendum)
1. Start Donepezil 10mg : Take 1/2 tablet daily for 2 weeks, then increase to 1 tablet daily  2. Recommend Driving Evaluation: If you are interested in the driving assessment, you can contact [employer] in [place] or [employer] [phone].  3. Recommend appointing a Mims in the event we cannot make decisions for ourselves  4. Follow-up in 6-8 months, call for any questions  FALL PRECAUTIONS: Be cautious when walking. Scan the area for obstacles that may increase the risk of trips and falls. When getting up in the mornings, sit up at the edge of the bed for a few minutes before getting out of bed. Consider elevating the bed at the head end to avoid drop of blood pressure when getting up. Walk always in a well-lit room (use night lights in the walls). Avoid area rugs or power cords from appliances in the middle of the walkways. Use a walker or a cane if necessary and consider physical therapy for balance exercise. Get your eyesight checked regularly.  FINANCIAL OVERSIGHT: Supervision, especially oversight when making financial decisions or transactions is also recommended.  HOME SAFETY: Consider the safety of the kitchen when operating appliances like stoves, microwave oven, and blender. Consider having supervision and share cooking responsibilities until no longer able to participate in those. Accidents with firearms and other hazards in the house should be identified and addressed as well.  DRIVING: Regarding driving, in patients with progressive memory problems, driving will be impaired. We advise to have someone else do the driving if trouble finding directions or if minor accidents are reported. Independent driving assessment is available to determine safety of driving.  ABILITY TO BE LEFT ALONE: If patient is unable to contact 911 operator, consider using [employer], or when the need is there, arrange for someone to stay with  patients. Smoking is a fire hazard, consider supervision or cessation. Risk of wandering should be assessed by caregiver and if detected at any point, supervision and safe proof recommendations should be instituted.  MEDICATION SUPERVISION: Inability to self-administer medication needs to be constantly addressed. Implement a mechanism to ensure safe administration of the medications.  RECOMMENDATIONS FOR ALL PATIENTS WITH MEMORY PROBLEMS: 1. Continue to exercise (Recommend 30 minutes of walking everyday, or 3 hours every week) 2. Increase social interactions - continue going to [place] and enjoy social gatherings with friends and family 3. Eat healthy, avoid fried foods and eat more fruits and vegetables 4. Maintain adequate blood pressure, blood sugar, and blood cholesterol level. Reducing the risk of stroke and cardiovascular disease also helps promoting better memory. 5. Avoid stressful situations. Live a simple life and avoid aggravations. Organize your time and prepare for the next day in anticipation. 6. Sleep well, avoid any interruptions of sleep and avoid any distractions in the bedroom that may interfere with adequate sleep quality 7. Avoid sugar, avoid sweets as there is a strong link between excessive sugar intake, diabetes, and cognitive impairment The Mediterranean diet has been shown to help patients reduce the risk of progressive memory disorders and reduces cardiovascular risk. This includes eating fish, eat fruits and green leafy vegetables, nuts like almonds and hazelnuts, walnuts, and also use olive oil. Avoid fast foods and fried foods as much as possible. Avoid sweets and sugar as sugar use has been linked to worsening of memory function.  There is always a concern of gradual progression of memory problems. If this is the case, then we may need to adjust level of  care according to patient needs. Support, both to the patient and caregiver, should then be put into place.
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
## 2019-01-23 ENCOUNTER — Encounter: Payer: Self-pay | Admitting: Neurology

## 2019-01-28 ENCOUNTER — Other Ambulatory Visit (INDEPENDENT_AMBULATORY_CARE_PROVIDER_SITE_OTHER): Payer: Medicare HMO

## 2019-01-28 ENCOUNTER — Ambulatory Visit (INDEPENDENT_AMBULATORY_CARE_PROVIDER_SITE_OTHER): Payer: Medicare HMO | Admitting: Internal Medicine

## 2019-01-28 ENCOUNTER — Encounter: Payer: Self-pay | Admitting: Internal Medicine

## 2019-01-28 VITALS — BP 138/80 | HR 61 | Temp 98.1°F | Resp 16 | Ht 64.0 in | Wt 117.0 lb

## 2019-01-28 DIAGNOSIS — E785 Hyperlipidemia, unspecified: Secondary | ICD-10-CM

## 2019-01-28 DIAGNOSIS — Q828 Other specified congenital malformations of skin: Secondary | ICD-10-CM

## 2019-01-28 DIAGNOSIS — Z1159 Encounter for screening for other viral diseases: Secondary | ICD-10-CM | POA: Diagnosis not present

## 2019-01-28 DIAGNOSIS — E039 Hypothyroidism, unspecified: Secondary | ICD-10-CM

## 2019-01-28 DIAGNOSIS — Z23 Encounter for immunization: Secondary | ICD-10-CM

## 2019-01-28 LAB — LIPID PANEL
Cholesterol: 189 mg/dL (ref 0–200)
HDL: 42.2 mg/dL (ref >39.00)
LDL Cholesterol: 115 mg/dL — ABNORMAL HIGH (ref 0–99)
NonHDL: 147
Total CHOL/HDL Ratio: 4
Triglycerides: 161 mg/dL — ABNORMAL HIGH (ref 0.0–149.0)
VLDL: 32.2 mg/dL (ref 0.0–40.0)

## 2019-01-28 LAB — TSH: TSH: 4.04 u[IU]/mL (ref 0.35–4.50)

## 2019-01-28 NOTE — Progress Notes (Signed)
Subjective:  Patient ID: Michelle [name], female    DOB: 06/16/1951  Age: 68 y.o. MRN: [id number]  CC: Hypothyroidism and Hypertension   HPI YILIN WEEDON presents for f/up - She complains of fatigue but otherwise feels well and offers no other complaints.  Outpatient Medications Prior to Visit  Medication Sig Dispense Refill  . amitriptyline (ELAVIL) 50 MG tablet TAKE 1 TABLET BY MOUTH EVERYDAY AT BEDTIME (Patient taking differently: Take 50 mg by mouth at bedtime as needed for sleep. ) 90 tablet 1  . aspirin 81 MG chewable tablet Chew 81 mg by mouth daily.    . betamethasone dipropionate (DIPROLENE) 0.05 % cream APPLY ON THE SKIN TWICE A DAY AS NEEDED FOR AREAS ON BODY  2  . cetirizine (ZYRTEC) 10 MG tablet TAKE 1 TABLET BY MOUTH TWICE A DAY 180 tablet 1  . cholecalciferol (VITAMIN D) 1000 UNITS tablet Take 1,000 Units by mouth daily.     Marland Kitchen donepezil (ARICEPT) 10 MG tablet Take 1/2 tablet daily for 2 weeks, then increase to 1 tablet daily 30 tablet 11  . levothyroxine (SYNTHROID, LEVOTHROID) 50 MCG tablet Take 1 tablet (50 mcg total) by mouth daily. 90 tablet 1  . Omega-3 Fatty Acids (FISH OIL) 1000 MG CAPS Take 2 capsules by mouth daily.     . sertraline (ZOLOFT) 100 MG tablet Take 1 tablet (100 mg total) by mouth daily. 90 tablet 1  . simvastatin (ZOCOR) 10 MG tablet TAKE 1 TABLET BY MOUTH EVERY DAY 90 tablet 1  . torsemide (DEMADEX) 10 MG tablet TAKE 1 TABLET BY MOUTH EVERY DAY 90 tablet 1  . triamcinolone ointment (KENALOG) 0.1 % 1 APPLICATION APPLY ON THE SKIN THREE TIMES A DAY PT TO USE 2-3 TIMES DAILY.  2  . dextromethorphan (DELSYM) 30 MG/5ML liquid Take 5 mLs (30 mg total) by mouth 2 (two) times daily. 148 mL 1  . Ginkgo Biloba 40 MG TABS Take 1 tablet by mouth daily.    . Multiple Vitamin (MULTIVITAMIN) tablet Take 1 tablet by mouth daily.     No facility-administered medications prior to visit.     ROS Review of Systems  Constitutional: Positive for fatigue. Negative  for appetite change, diaphoresis and unexpected weight change.  HENT: Negative.   Eyes: Negative for visual disturbance.  Respiratory: Negative for cough, chest tightness, shortness of breath and wheezing.   Cardiovascular: Negative for chest pain, palpitations and leg swelling.  Gastrointestinal: Negative for abdominal pain, constipation, diarrhea, nausea and vomiting.  Endocrine: Negative for cold intolerance and heat intolerance.  Genitourinary: Negative.  Negative for difficulty urinating.  Musculoskeletal: Negative.  Negative for arthralgias and myalgias.  Skin: Positive for rash. Negative for color change and pallor.  Neurological: Negative for dizziness, weakness, light-headedness and headaches.  Hematological: Negative for adenopathy. Does not bruise/bleed easily.  Psychiatric/Behavioral: Negative.  Negative for sleep disturbance and suicidal ideas. The patient is not nervous/anxious.     Objective:  BP 138/80 (BP Location: Left Arm, Patient Position: Sitting, Cuff Size: Normal)   Pulse 61   Temp 98.1 F (36.7 C) (Oral)   Resp 16   Ht 5\' 4"  (1.626 m)   Wt 117 lb (53.1 kg)   SpO2 96%   BMI 20.08 kg/m   BP Readings from Last 3 Encounters:  01/28/19 138/80  01/20/19 110/74  01/07/19 140/90    Wt Readings from Last 3 Encounters:  01/28/19 117 lb (53.1 kg)  01/20/19 118 lb (53.5 kg)  01/07/19 115  lb (52.2 kg)    Physical Exam Vitals signs reviewed.  Constitutional:      Appearance: She is not ill-appearing or diaphoretic.  HENT:     Nose: Nose normal. No congestion.     Mouth/Throat:     Mouth: Mucous membranes are moist.     Pharynx: Oropharynx is clear. No oropharyngeal exudate or posterior oropharyngeal erythema.  Eyes:     General: No scleral icterus.    Conjunctiva/sclera: Conjunctivae normal.  Neck:     Musculoskeletal: Normal range of motion and neck supple. No muscular tenderness.  Cardiovascular:     Rate and Rhythm: Normal rate and regular rhythm.      Heart sounds: No murmur. No friction rub. No gallop.   Pulmonary:     Effort: Pulmonary effort is normal.     Breath sounds: No stridor. No wheezing, rhonchi or rales.  Abdominal:     General: Abdomen is flat. Bowel sounds are normal. There is no distension.     Palpations: There is no hepatomegaly, splenomegaly or mass.     Tenderness: There is no abdominal tenderness. There is no guarding.  Musculoskeletal: Normal range of motion.        General: No swelling.     Right lower leg: No edema.     Left lower leg: No edema.  Lymphadenopathy:     Cervical: No cervical adenopathy.  Skin:    Findings: Rash present.  Neurological:     General: No focal deficit present.     Mental Status: She is oriented to person, place, and time. Mental status is at baseline.  Psychiatric:        Mood and Affect: Mood normal.        Behavior: Behavior normal.        Thought Content: Thought content normal.        Judgment: Judgment normal.     Lab Results  Component Value Date   WBC 7.2 12/09/2018   HGB 13.6 12/09/2018   HCT 43.6 12/09/2018   PLT 185 12/09/2018   GLUCOSE 104 (H) 12/09/2018   CHOL 189 01/28/2019   TRIG 161.0 (H) 01/28/2019   HDL 42.20 01/28/2019   LDLDIRECT 113 (H) 06/16/2008   LDLCALC 115 (H) 01/28/2019   ALT 7 01/28/2018   AST 19 01/28/2018   NA 142 12/09/2018   K 3.7 12/09/2018   CL 103 12/09/2018   CREATININE 0.89 12/09/2018   BUN 16 12/09/2018   CO2 29 12/09/2018   TSH 4.04 01/28/2019   INR 1.10 08/07/2017    Dg Chest 2 View  Result Date: 01/07/2019 CLINICAL DATA:  Cough. Bronchitis with left anterior chest pain. Ex-smoker. EXAM: CHEST - 2 VIEW COMPARISON:  On 12/09/2018 radiographs. FINDINGS: The heart size and mediastinal contours are stable status post median sternotomy for reported repair of tetralogy of Fallot. There are tricuspid and pulmonic prosthetic valves. The lungs are clear. There is no pleural effusion or pneumothorax. No acute osseous findings are  evident. There is a mild convex right thoracic scoliosis. IMPRESSION: Stable postoperative chest.  No acute cardiopulmonary process. Electronically Signed   By: Richardean Sale M.D.   On: 01/07/2019 15:56    Assessment & Plan:   Afsa was seen today for hypothyroidism and hypertension.  Diagnoses and all orders for this visit:  Acquired hypothyroidism- Her TSH is in an acceptable range.  She will remain on the current dose of levothyroxine. -     TSH; Future  Hyperlipidemia LDL goal <  100- She has achieved her LDL goal and is doing well on the statin. 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   I have discontinued Bonnita Nasuti I. Seago's multivitamin, Ginkgo Biloba, and dextromethorphan. I am also having her maintain her cholecalciferol, betamethasone dipropionate, levothyroxine, torsemide, triamcinolone ointment, sertraline, simvastatin, cetirizine, amitriptyline, aspirin, Fish Oil, and donepezil.  No orders of the defined types were placed in this encounter.    Follow-up: Return in about 6 months (around 07/29/2019).  [name], MD

## 2019-01-28 NOTE — Patient Instructions (Signed)
Hypothyroidism  Hypothyroidism is when the thyroid gland does not make enough of certain hormones (it is underactive). The thyroid gland is a small gland located in the lower front part of the neck, just in front of the windpipe (trachea). This gland makes hormones that help control how the body uses food for energy (metabolism) as well as how the heart and brain function. These hormones also play a role in keeping your bones strong. When the thyroid is underactive, it produces too little of the hormones thyroxine (T4) and triiodothyronine (T3). What are the causes? This condition may be caused by:  Hashimoto's disease. This is a disease in which the body's disease-fighting system (immune system) attacks the thyroid gland. This is the most common cause.  Viral infections.  Pregnancy.  Certain medicines.  Birth defects.  Past radiation treatments to the head or neck for cancer.  Past treatment with radioactive iodine.  Past exposure to radiation in the environment.  Past surgical removal of part or all of the thyroid.  Problems with a gland in the center of the brain (pituitary gland).  Lack of enough iodine in the diet. What increases the risk? You are more likely to develop this condition if:  You are female.  You have a family history of thyroid conditions.  You use a medicine called lithium.  You take medicines that affect the immune system (immunosuppressants). What are the signs or symptoms? Symptoms of this condition include:  Feeling as though you have no energy (lethargy).  Not being able to tolerate cold.  Weight gain that is not explained by a change in diet or exercise habits.  Lack of appetite.  Dry skin.  Coarse hair.  Menstrual irregularity.  Slowing of thought processes.  Constipation.  Sadness or depression. How is this diagnosed? This condition may be diagnosed based on:  Your symptoms, your medical history, and a physical exam.  Blood  tests. You may also have imaging tests, such as an ultrasound or MRI. How is this treated? This condition is treated with medicine that replaces the thyroid hormones that your body does not make. After you begin treatment, it may take several weeks for symptoms to go away. Follow these instructions at home:  Take over-the-counter and prescription medicines only as told by your health care provider.  If you start taking any new medicines, tell your health care provider.  Keep all follow-up visits as told by your health care provider. This is important. ? As your condition improves, your dosage of thyroid hormone medicine may change. ? You will need to have blood tests regularly so that your health care provider can monitor your condition. Contact a health care provider if:  Your symptoms do not get better with treatment.  You are taking thyroid replacement medicine and you: ? Sweat a lot. ? Have tremors. ? Feel anxious. ? Lose weight rapidly. ? Cannot tolerate heat. ? Have emotional swings. ? Have diarrhea. ? Feel weak. Get help right away if you have:  Chest pain.  An irregular heartbeat.  A rapid heartbeat.  Difficulty breathing. Summary  Hypothyroidism is when the thyroid gland does not make enough of certain hormones (it is underactive).  When the thyroid is underactive, it produces too little of the hormones thyroxine (T4) and triiodothyronine (T3).  The most common cause is Hashimoto's disease, a disease in which the body's disease-fighting system (immune system) attacks the thyroid gland. The condition can also be caused by viral infections, medicine, pregnancy, or past   radiation treatment to the head or neck.  Symptoms may include weight gain, dry skin, constipation, feeling as though you do not have energy, and not being able to tolerate cold.  This condition is treated with medicine to replace the thyroid hormones that your body does not make. This information  is not intended to replace advice given to you by your health care provider. Make sure you discuss any questions you have with your health care provider. Document Released: 12/03/2005 Document Revised: 11/13/2017 Document Reviewed: 11/13/2017 [employer] Interactive Patient Education  2019 [employer]  

## 2019-01-29 ENCOUNTER — Encounter: Payer: Self-pay | Admitting: Internal Medicine

## 2019-01-29 LAB — HEPATITIS C ANTIBODY
Hepatitis C Ab: NONREACTIVE
SIGNAL TO CUT-OFF: 0.02 (ref <1.00)

## 2019-06-08 ENCOUNTER — Other Ambulatory Visit: Payer: Self-pay | Admitting: Internal Medicine

## 2019-06-08 DIAGNOSIS — E785 Hyperlipidemia, unspecified: Secondary | ICD-10-CM

## 2019-06-08 DIAGNOSIS — Q828 Other specified congenital malformations of skin: Secondary | ICD-10-CM

## 2019-06-26 DIAGNOSIS — Z0100 Encounter for examination of eyes and vision without abnormal findings: Secondary | ICD-10-CM | POA: Diagnosis not present

## 2019-06-26 DIAGNOSIS — H5203 Hypermetropia, bilateral: Secondary | ICD-10-CM | POA: Diagnosis not present

## 2019-07-29 ENCOUNTER — Other Ambulatory Visit (INDEPENDENT_AMBULATORY_CARE_PROVIDER_SITE_OTHER): Payer: Medicare HMO

## 2019-07-29 ENCOUNTER — Other Ambulatory Visit: Payer: Self-pay

## 2019-07-29 ENCOUNTER — Encounter: Payer: Self-pay | Admitting: Internal Medicine

## 2019-07-29 ENCOUNTER — Ambulatory Visit (INDEPENDENT_AMBULATORY_CARE_PROVIDER_SITE_OTHER): Payer: Medicare HMO | Admitting: Internal Medicine

## 2019-07-29 VITALS — BP 140/86 | HR 71 | Temp 98.7°F | Resp 16 | Ht 64.0 in | Wt 107.0 lb

## 2019-07-29 DIAGNOSIS — K5904 Chronic idiopathic constipation: Secondary | ICD-10-CM

## 2019-07-29 DIAGNOSIS — I10 Essential (primary) hypertension: Secondary | ICD-10-CM

## 2019-07-29 DIAGNOSIS — R310 Gross hematuria: Secondary | ICD-10-CM | POA: Diagnosis not present

## 2019-07-29 DIAGNOSIS — E039 Hypothyroidism, unspecified: Secondary | ICD-10-CM

## 2019-07-29 DIAGNOSIS — Z1231 Encounter for screening mammogram for malignant neoplasm of breast: Secondary | ICD-10-CM

## 2019-07-29 DIAGNOSIS — Z720 Tobacco use: Secondary | ICD-10-CM

## 2019-07-29 LAB — CBC WITH DIFFERENTIAL/PLATELET
Basophils Absolute: 0.1 10*3/uL (ref 0.0–0.1)
Basophils Relative: 0.7 % (ref 0.0–3.0)
Eosinophils Absolute: 0.2 10*3/uL (ref 0.0–0.7)
Eosinophils Relative: 2 % (ref 0.0–5.0)
HCT: 41.6 % (ref 36.0–46.0)
Hemoglobin: 13.9 g/dL (ref 12.0–15.0)
Lymphocytes Relative: 26.8 % (ref 12.0–46.0)
Lymphs Abs: 2.3 10*3/uL (ref 0.7–4.0)
MCHC: 33.4 g/dL (ref 30.0–36.0)
MCV: 89.7 fl (ref 78.0–100.0)
Monocytes Absolute: 0.5 10*3/uL (ref 0.1–1.0)
Monocytes Relative: 5.5 % (ref 3.0–12.0)
Neutro Abs: 5.6 10*3/uL (ref 1.4–7.7)
Neutrophils Relative %: 65 % (ref 43.0–77.0)
Platelets: 249 10*3/uL (ref 150.0–400.0)
RBC: 4.63 Mil/uL (ref 3.87–5.11)
RDW: 14.1 % (ref 11.5–15.5)
WBC: 8.7 10*3/uL (ref 4.0–10.5)

## 2019-07-29 LAB — URINALYSIS, ROUTINE W REFLEX MICROSCOPIC
Bilirubin Urine: NEGATIVE
Ketones, ur: NEGATIVE
Nitrite: NEGATIVE
Specific Gravity, Urine: 1.02 (ref 1.000–1.030)
Total Protein, Urine: NEGATIVE
Urine Glucose: NEGATIVE
Urobilinogen, UA: 0.2 (ref 0.0–1.0)
pH: 5.5 (ref 5.0–8.0)

## 2019-07-29 LAB — BASIC METABOLIC PANEL
BUN: 15 mg/dL (ref 6–23)
CO2: 26 mEq/L (ref 19–32)
Calcium: 9.4 mg/dL (ref 8.4–10.5)
Chloride: 103 mEq/L (ref 96–112)
Creatinine, Ser: 1.09 mg/dL (ref 0.40–1.20)
GFR: 49.71 mL/min — ABNORMAL LOW (ref >60.00)
Glucose, Bld: 94 mg/dL (ref 70–99)
Potassium: 4 mEq/L (ref 3.5–5.1)
Sodium: 138 mEq/L (ref 135–145)

## 2019-07-29 LAB — TSH: TSH: 2.83 u[IU]/mL (ref 0.35–4.50)

## 2019-07-29 MED ORDER — LEVOTHYROXINE SODIUM 50 MCG PO TABS: 50.0000 ug | ORAL_TABLET | Freq: Every day | ORAL | 1 refills | Status: DC

## 2019-07-29 MED ORDER — LINACLOTIDE 72 MCG PO CAPS: 72.0000 ug | ORAL_CAPSULE | Freq: Every day | ORAL | 0 refills | Status: DC

## 2019-07-29 NOTE — Progress Notes (Signed)
Subjective:  Patient ID: Michelle [name], female    DOB: 03-07-1951  Age: 68 y.o. MRN: [id number]  CC: Hypertension and Hypothyroidism   HPI LAZARA GRIESER presents for f/up - She complains of a several month history of gross, intermittent, painless hematuria.  She smokes cigarettes.  She also complains of severe constipation and straining.  Outpatient Medications Prior to Visit  Medication Sig Dispense Refill  . amitriptyline (ELAVIL) 50 MG tablet TAKE 1 TABLET BY MOUTH EVERYDAY AT BEDTIME (Patient taking differently: Take 50 mg by mouth at bedtime as needed for sleep. ) 90 tablet 1  . aspirin 81 MG chewable tablet Chew 81 mg by mouth daily.    . betamethasone dipropionate (DIPROLENE) 0.05 % cream APPLY ON THE SKIN TWICE A DAY AS NEEDED FOR AREAS ON BODY  2  . cetirizine (ZYRTEC) 10 MG tablet TAKE 1 TABLET BY MOUTH TWICE A DAY 180 tablet 1  . cholecalciferol (VITAMIN D) 1000 UNITS tablet Take 1,000 Units by mouth daily.     Marland Kitchen donepezil (ARICEPT) 10 MG tablet Take 1/2 tablet daily for 2 weeks, then increase to 1 tablet daily 30 tablet 11  . Omega-3 Fatty Acids (FISH OIL) 1000 MG CAPS Take 2 capsules by mouth daily.     . sertraline (ZOLOFT) 100 MG tablet Take 1 tablet (100 mg total) by mouth daily. 90 tablet 1  . simvastatin (ZOCOR) 10 MG tablet TAKE 1 TABLET BY MOUTH EVERY DAY 90 tablet 1  . torsemide (DEMADEX) 10 MG tablet TAKE 1 TABLET BY MOUTH EVERY DAY 90 tablet 1  . triamcinolone ointment (KENALOG) 0.1 % 1 APPLICATION APPLY ON THE SKIN THREE TIMES A DAY PT TO USE 2-3 TIMES DAILY.  2  . levothyroxine (SYNTHROID, LEVOTHROID) 50 MCG tablet Take 1 tablet (50 mcg total) by mouth daily. 90 tablet 1   No facility-administered medications prior to visit.     ROS Review of Systems  Constitutional: Positive for unexpected weight change (wt loss).  HENT: Negative for trouble swallowing.   Eyes: Negative for visual disturbance.  Respiratory: Negative for cough, choking, shortness of  breath and wheezing.   Cardiovascular: Negative for chest pain, palpitations and leg swelling.  Gastrointestinal: Positive for constipation. Negative for abdominal pain, blood in stool, diarrhea, nausea and vomiting.  Endocrine: Negative for cold intolerance and heat intolerance.  Genitourinary: Positive for hematuria. Negative for decreased urine volume, dysuria, flank pain, urgency, vaginal bleeding and vaginal discharge.  Musculoskeletal: Negative.   Skin: Positive for rash.       No change in her chronic rash.  The Darier-White disease has not been bothering her lately.  Neurological: Negative.  Negative for dizziness, weakness, light-headedness and headaches.  Hematological: Negative for adenopathy. Does not bruise/bleed easily.  Psychiatric/Behavioral: Negative.     Objective:  BP 140/86 (BP Location: Left Arm, Patient Position: Sitting, Cuff Size: Normal)   Pulse 71   Temp 98.7 F (37.1 C) (Oral)   Resp 16   Ht 5\' 4"  (1.626 m)   Wt 107 lb (48.5 kg)   SpO2 95%   BMI 18.37 kg/m   BP Readings from Last 3 Encounters:  07/29/19 140/86  01/28/19 138/80  01/20/19 110/74    Wt Readings from Last 3 Encounters:  07/29/19 107 lb (48.5 kg)  01/28/19 117 lb (53.1 kg)  01/20/19 118 lb (53.5 kg)    Physical Exam Vitals signs reviewed.  Constitutional:      Appearance: She is not ill-appearing or diaphoretic.  HENT:     Nose: Nose normal.     Mouth/Throat:     Mouth: Mucous membranes are moist.  Eyes:     General: No scleral icterus.    Conjunctiva/sclera: Conjunctivae normal.  Neck:     Musculoskeletal: Normal range of motion. No neck rigidity.  Cardiovascular:     Rate and Rhythm: Normal rate and regular rhythm.     Heart sounds: No murmur.  Pulmonary:     Effort: Pulmonary effort is normal.     Breath sounds: No stridor. No wheezing, rhonchi or rales.  Abdominal:     General: Abdomen is flat.     Palpations: There is no hepatomegaly or splenomegaly.      Tenderness: There is no abdominal tenderness.  Musculoskeletal: Normal range of motion.     Right lower leg: No edema.     Left lower leg: No edema.  Lymphadenopathy:     Cervical: No cervical adenopathy.  Skin:    General: Skin is warm and dry.     Coloration: Skin is not pale.     Findings: Rash present.  Neurological:     General: No focal deficit present.     Mental Status: She is alert and oriented to person, place, and time. Mental status is at baseline.     Lab Results  Component Value Date   WBC 8.7 07/29/2019   HGB 13.9 07/29/2019   HCT 41.6 07/29/2019   PLT 249.0 07/29/2019   GLUCOSE 94 07/29/2019   CHOL 189 01/28/2019   TRIG 161.0 (H) 01/28/2019   HDL 42.20 01/28/2019   LDLDIRECT 113 (H) 06/16/2008   LDLCALC 115 (H) 01/28/2019   ALT 7 01/28/2018   AST 19 01/28/2018   NA 138 07/29/2019   K 4.0 07/29/2019   CL 103 07/29/2019   CREATININE 1.09 07/29/2019   BUN 15 07/29/2019   CO2 26 07/29/2019   TSH 2.83 07/29/2019   INR 1.10 08/07/2017    Dg Chest 2 View  Result Date: 01/07/2019 CLINICAL DATA:  Cough. Bronchitis with left anterior chest pain. Ex-smoker. EXAM: CHEST - 2 VIEW COMPARISON:  On 12/09/2018 radiographs. FINDINGS: The heart size and mediastinal contours are stable status post median sternotomy for reported repair of tetralogy of Fallot. There are tricuspid and pulmonic prosthetic valves. The lungs are clear. There is no pleural effusion or pneumothorax. No acute osseous findings are evident. There is a mild convex right thoracic scoliosis. IMPRESSION: Stable postoperative chest.  No acute cardiopulmonary process. Electronically Signed   By: Richardean Sale M.D.   On: 01/07/2019 15:56    Assessment & Plan:   Pat was seen today for hypertension and hypothyroidism.  Diagnoses and all orders for this visit:  Essential hypertension- Her blood pressure is adequately well controlled. -     CBC with Differential/Platelet; Future -     Basic metabolic  panel; Future  Acquired hypothyroidism- Her TSH is in the normal range.  She will remain on the current dose of levothyroxine. -     TSH; Future -     levothyroxine (SYNTHROID) 50 MCG tablet; Take 1 tablet (50 mcg total) by mouth daily.  Gross hematuria- She has gross hematuria.  Urine culture is pending.  I have asked her to undergo a renal CT to see if there is concern for renal cell carcinoma.  I also think she should see a urologist to consider a cystoscopy. -     Urinalysis, Routine w reflex microscopic; Future -  CULTURE, URINE COMPREHENSIVE; Future -     CT RENAL ABD W/WO; Future -     Ambulatory referral to Urology  Visit for screening mammogram -     MM DIGITAL SCREENING BILATERAL; Future  Chronic idiopathic constipation- Her labs are negative for secondary causes.  I have asked her to start taking Linzess for this. -     linaclotide (LINZESS) 72 MCG capsule; Take 1 capsule (72 mcg total) by mouth daily before breakfast.  Tobacco abuse -     Ambulatory Referral for Lung Cancer Scre   I have changed Bonnita Nasuti I. Shorkey's levothyroxine. I am also having her start on linaclotide. Additionally, I am having her maintain her cholecalciferol, betamethasone dipropionate, torsemide, triamcinolone ointment, sertraline, amitriptyline, aspirin, Fish Oil, donepezil, cetirizine, and simvastatin.  Meds ordered this encounter  Medications  . linaclotide (LINZESS) 72 MCG capsule    Sig: Take 1 capsule (72 mcg total) by mouth daily before breakfast.    Dispense:  126 capsule    Refill:  0  . levothyroxine (SYNTHROID) 50 MCG tablet    Sig: Take 1 tablet (50 mcg total) by mouth daily.    Dispense:  90 tablet    Refill:  1     Follow-up: Return in about 4 months (around 11/28/2019).  [name], MD

## 2019-07-29 NOTE — Patient Instructions (Signed)
Hypothyroidism  Hypothyroidism is when the thyroid gland does not make enough of certain hormones (it is underactive). The thyroid gland is a small gland located in the lower front part of the neck, just in front of the windpipe (trachea). This gland makes hormones that help control how the body uses food for energy (metabolism) as well as how the heart and brain function. These hormones also play a role in keeping your bones strong. When the thyroid is underactive, it produces too little of the hormones thyroxine (T4) and triiodothyronine (T3). What are the causes? This condition may be caused by:  Hashimoto's disease. This is a disease in which the body's disease-fighting system (immune system) attacks the thyroid gland. This is the most common cause.  Viral infections.  Pregnancy.  Certain medicines.  Birth defects.  Past radiation treatments to the head or neck for cancer.  Past treatment with radioactive iodine.  Past exposure to radiation in the environment.  Past surgical removal of part or all of the thyroid.  Problems with a gland in the center of the brain (pituitary gland).  Lack of enough iodine in the diet. What increases the risk? You are more likely to develop this condition if:  You are female.  You have a family history of thyroid conditions.  You use a medicine called lithium.  You take medicines that affect the immune system (immunosuppressants). What are the signs or symptoms? Symptoms of this condition include:  Feeling as though you have no energy (lethargy).  Not being able to tolerate cold.  Weight gain that is not explained by a change in diet or exercise habits.  Lack of appetite.  Dry skin.  Coarse hair.  Menstrual irregularity.  Slowing of thought processes.  Constipation.  Sadness or depression. How is this diagnosed? This condition may be diagnosed based on:  Your symptoms, your medical history, and a physical exam.  Blood  tests. You may also have imaging tests, such as an ultrasound or MRI. How is this treated? This condition is treated with medicine that replaces the thyroid hormones that your body does not make. After you begin treatment, it may take several weeks for symptoms to go away. Follow these instructions at home:  Take over-the-counter and prescription medicines only as told by your health care provider.  If you start taking any new medicines, tell your health care provider.  Keep all follow-up visits as told by your health care provider. This is important. ? As your condition improves, your dosage of thyroid hormone medicine may change. ? You will need to have blood tests regularly so that your health care provider can monitor your condition. Contact a health care provider if:  Your symptoms do not get better with treatment.  You are taking thyroid replacement medicine and you: ? Sweat a lot. ? Have tremors. ? Feel anxious. ? Lose weight rapidly. ? Cannot tolerate heat. ? Have emotional swings. ? Have diarrhea. ? Feel weak. Get help right away if you have:  Chest pain.  An irregular heartbeat.  A rapid heartbeat.  Difficulty breathing. Summary  Hypothyroidism is when the thyroid gland does not make enough of certain hormones (it is underactive).  When the thyroid is underactive, it produces too little of the hormones thyroxine (T4) and triiodothyronine (T3).  The most common cause is Hashimoto's disease, a disease in which the body's disease-fighting system (immune system) attacks the thyroid gland. The condition can also be caused by viral infections, medicine, pregnancy, or past   radiation treatment to the head or neck.  Symptoms may include weight gain, dry skin, constipation, feeling as though you do not have energy, and not being able to tolerate cold.  This condition is treated with medicine to replace the thyroid hormones that your body does not make. This information  is not intended to replace advice given to you by your health care provider. Make sure you discuss any questions you have with your health care provider. Document Released: 12/03/2005 Document Revised: 11/15/2017 Document Reviewed: 11/13/2017 Elsevier Patient Education  2020 [employer]  

## 2019-07-31 LAB — CULTURE, URINE COMPREHENSIVE
MICRO NUMBER:: 763783
SPECIMEN QUALITY:: ADEQUATE

## 2019-08-07 ENCOUNTER — Other Ambulatory Visit: Payer: Self-pay | Admitting: *Deleted

## 2019-08-07 DIAGNOSIS — F17210 Nicotine dependence, cigarettes, uncomplicated: Secondary | ICD-10-CM

## 2019-08-07 DIAGNOSIS — Z122 Encounter for screening for malignant neoplasm of respiratory organs: Secondary | ICD-10-CM

## 2019-08-07 DIAGNOSIS — Z87891 Personal history of nicotine dependence: Secondary | ICD-10-CM

## 2019-08-17 ENCOUNTER — Encounter: Payer: Self-pay | Admitting: Acute Care

## 2019-08-17 ENCOUNTER — Ambulatory Visit (INDEPENDENT_AMBULATORY_CARE_PROVIDER_SITE_OTHER): Payer: Medicare HMO | Admitting: Acute Care

## 2019-08-17 ENCOUNTER — Ambulatory Visit: Admission: RE | Admit: 2019-08-17 | Payer: Medicare HMO | Source: Ambulatory Visit

## 2019-08-17 ENCOUNTER — Other Ambulatory Visit: Payer: Self-pay

## 2019-08-17 VITALS — BP 124/88 | HR 66 | Ht 64.0 in | Wt 104.6 lb

## 2019-08-17 DIAGNOSIS — F17210 Nicotine dependence, cigarettes, uncomplicated: Secondary | ICD-10-CM | POA: Diagnosis not present

## 2019-08-17 NOTE — Progress Notes (Signed)
Shared Decision Making Visit Lung Cancer Screening Program [id number])   Eligibility:  Age 68 y.o.  Pack Years Smoking History Calculation 43 pack year smoking history (# packs/per year x # years smoked)  Recent History of coughing up blood  no  Unexplained weight loss? no ( >Than 15 pounds within the last 6 months )  Prior History Lung / other cancer no (Diagnosis within the last 5 years already requiring surveillance chest CT Scans).  Smoking Status Current Smoker  Former Smokers: Years since quit: NA  Quit Date: NA  Visit Components:  Discussion included one or more decision making aids. yes  Discussion included risk/benefits of screening. yes  Discussion included potential follow up diagnostic testing for abnormal scans. yes  Discussion included meaning and risk of over diagnosis. yes  Discussion included meaning and risk of False Positives. yes  Discussion included meaning of total radiation exposure. yes  Counseling Included:  Importance of adherence to annual lung cancer LDCT screening. yes  Impact of comorbidities on ability to participate in the program. yes  Ability and willingness to under diagnostic treatment. yes  Smoking Cessation Counseling:  Current Smokers:   Discussed importance of smoking cessation. yes  Information about tobacco cessation classes and interventions provided to patient. yes  Patient provided with "ticket" for LDCT Scan. yes  Symptomatic Patient. no  Counseling  Diagnosis Code: Tobacco Use Z72.0  Asymptomatic Patient yes  Counseling (Intermediate counseling: > three minutes counseling) [id number]  Former Smokers:   Discussed the importance of maintaining cigarette abstinence. yes  Diagnosis Code: Personal History of Nicotine Dependence. [id number]  Information about tobacco cessation classes and interventions provided to patient. Yes  Patient provided with "ticket" for LDCT Scan. yes  Written Order for Lung Cancer  Screening with LDCT placed in Epic. Yes (CT Chest Lung Cancer Screening Low Dose W/O CM) [id number] Z12.2-Screening of respiratory organs Z87.891-Personal history of nicotine dependence  BP 124/88 (BP Location: Right Arm, Cuff Size: Normal)   Pulse 66   Ht 5\' 4"  (1.626 m)   Wt 104 lb 9.6 oz (47.4 kg)   SpO2 97%   BMI 17.95 kg/m    I have spent 25 minutes of face to face time with Michelle [name] discussing the risks and benefits of lung cancer screening. We viewed a power point together that explained in detail the above noted topics. We paused at intervals to allow for questions to be asked and answered to ensure understanding.We discussed that the single most powerful action that she can take to decrease his risk of developing lung cancer is to quit smoking. We discussed whether or not she is ready to commit to setting a quit date. We discussed options for tools to aid in quitting smoking including nicotine replacement therapy, non-nicotine medications, support groups, Quit Smart classes, and behavior modification. We discussed that often times setting smaller, more achievable goals, such as eliminating 1 cigarette a day for a week and then 2 cigarettes a day for a week can be helpful in slowly decreasing the number of cigarettes smoked. This allows for a sense of accomplishment as well as providing a clinical benefit. I gave her the " Be Stronger Than Your Excuses" card with contact information for community resources, classes, free nicotine replacement therapy, and access to mobile apps, text messaging, and on-line smoking cessation help. I have also given her my card and contact information in the event she needs to contact me. We discussed the time and location of the scan, and that  either Doroteo Glassman RN or I will call with the results within 24-48 hours of receiving them. I have offered her  a copy of the power point we viewed  as a resource in the event they need reinforcement of the concepts we  discussed today in the office. The patient verbalized understanding of all of  the above and had no further questions upon leaving the office. They have my contact information in the event they have any further questions.  I spent 5 minutes counseling on smoking cessation and the health risks of continued tobacco abuse.  I explained to the patient that there has been a high incidence of coronary artery disease noted on these exams. I explained that this is a non-gated exam therefore degree or severity cannot be determined. This patient is not on statin therapy. I have asked the patient to follow-up with their PCP regarding any incidental finding of coronary artery disease and management with diet or medication as their PCP  feels is clinically indicated. The patient verbalized understanding of the above and had no further questions upon completion of the visit.     Michelle Spatz, NP 08/17/2019  4:07 PM

## 2019-08-17 NOTE — Patient Instructions (Signed)
Thank you for participating in the [facility]. It was our pleasure to meet you today. We will call you with the results of your scan within the next few days. Your scan will be assigned a Lung RADS category score by the physicians reading the scans.  This Lung RADS score determines follow up scanning.  See below for description of categories, and follow up screening recommendations. We will be in touch to schedule your follow up screening annually or based on recommendations of our providers. We will fax a copy of your scan results to your Primary Care Physician, or the physician who referred you to the program, to ensure they have the results. Please call the office if you have any questions or concerns regarding your scanning experience or results.  Our office number is [phone]. Please speak with Denise Phelps, RN. She is our Lung Cancer Screening RN. If she is unavailable when you call, please have the office staff send her a message. She will return your call at her earliest convenience. Remember, if your scan is normal, we will scan you annually as long as you continue to meet the criteria for the program. (Age 55-77, Current smoker or smoker who has quit within the last 15 years). If you are a smoker, remember, quitting is the single most powerful action that you can take to decrease your risk of lung cancer and other pulmonary, breathing related problems. We know quitting is hard, and we are here to help.  Please let us know if there is anything we can do to help you meet your goal of quitting. If you are a former smoker, congratulations. We are proud of you! Remain smoke free! Remember you can refer friends or family members through the number above.  We will screen them to make sure they meet criteria for the program. Thank you for helping us take better care of you by participating in Lung Screening.  Lung RADS Categories:  Lung RADS 1: no nodules  or definitely non-concerning nodules.  Recommendation is for a repeat annual scan in 12 months.  Lung RADS 2:  nodules that are non-concerning in appearance and behavior with a very low likelihood of becoming an active cancer. Recommendation is for a repeat annual scan in 12 months.  Lung RADS 3: nodules that are probably non-concerning , includes nodules with a low likelihood of becoming an active cancer.  Recommendation is for a 6-month repeat screening scan. Often noted after an upper respiratory illness. We will be in touch to make sure you have no questions, and to schedule your 6-month scan.  Lung RADS 4 A: nodules with concerning findings, recommendation is most often for a follow up scan in 3 months or additional testing based on our provider's assessment of the scan. We will be in touch to make sure you have no questions and to schedule the recommended 3 month follow up scan.  Lung RADS 4 B:  indicates findings that are concerning. We will be in touch with you to schedule additional diagnostic testing based on our provider's  assessment of the scan.   

## 2019-08-25 ENCOUNTER — Ambulatory Visit
Admission: RE | Admit: 2019-08-25 | Discharge: 2019-08-25 | Disposition: A | Discharge status: Home / Self Care | Payer: Medicare HMO | Source: Ambulatory Visit | Attending: Acute Care | Admitting: Acute Care

## 2019-08-25 ENCOUNTER — Inpatient Hospital Stay: Admission: RE | Admit: 2019-08-25 | Payer: Medicare HMO | Source: Ambulatory Visit

## 2019-08-25 DIAGNOSIS — F17210 Nicotine dependence, cigarettes, uncomplicated: Secondary | ICD-10-CM

## 2019-08-25 DIAGNOSIS — Z87891 Personal history of nicotine dependence: Secondary | ICD-10-CM

## 2019-08-25 DIAGNOSIS — Z122 Encounter for screening for malignant neoplasm of respiratory organs: Secondary | ICD-10-CM

## 2019-08-31 ENCOUNTER — Other Ambulatory Visit: Payer: Self-pay | Admitting: *Deleted

## 2019-08-31 DIAGNOSIS — Z122 Encounter for screening for malignant neoplasm of respiratory organs: Secondary | ICD-10-CM

## 2019-08-31 DIAGNOSIS — F17210 Nicotine dependence, cigarettes, uncomplicated: Secondary | ICD-10-CM

## 2019-08-31 DIAGNOSIS — Z87891 Personal history of nicotine dependence: Secondary | ICD-10-CM

## 2019-09-14 ENCOUNTER — Other Ambulatory Visit (INDEPENDENT_AMBULATORY_CARE_PROVIDER_SITE_OTHER): Payer: Medicare HMO

## 2019-09-14 ENCOUNTER — Telehealth: Payer: Self-pay

## 2019-09-14 DIAGNOSIS — R310 Gross hematuria: Secondary | ICD-10-CM

## 2019-09-14 LAB — BASIC METABOLIC PANEL
BUN: 14 mg/dL (ref 6–23)
CO2: 31 mEq/L (ref 19–32)
Calcium: 9.6 mg/dL (ref 8.4–10.5)
Chloride: 102 mEq/L (ref 96–112)
Creatinine, Ser: 1.13 mg/dL (ref 0.40–1.20)
GFR: 47.66 mL/min — ABNORMAL LOW (ref >60.00)
Glucose, Bld: 89 mg/dL (ref 70–99)
Potassium: 4.3 mEq/L (ref 3.5–5.1)
Sodium: 140 mEq/L (ref 135–145)

## 2019-09-14 NOTE — Telephone Encounter (Signed)
Lab order has been entered as requested.

## 2019-09-14 NOTE — Telephone Encounter (Signed)
-----   Message from Octavio Manns sent at 09/14/2019  9:29 AM EDT ----- Can you please put a STAT BMET in. She's having a CT scan tomorrow and her labs have expired.   Thanks Cecille Rubin

## 2019-09-15 ENCOUNTER — Encounter (INDEPENDENT_AMBULATORY_CARE_PROVIDER_SITE_OTHER): Payer: Self-pay

## 2019-09-15 ENCOUNTER — Ambulatory Visit (INDEPENDENT_AMBULATORY_CARE_PROVIDER_SITE_OTHER)
Admission: RE | Admit: 2019-09-15 | Discharge: 2019-09-15 | Disposition: A | Discharge status: Home / Self Care | Payer: Medicare HMO | Source: Ambulatory Visit | Attending: Internal Medicine | Admitting: Internal Medicine

## 2019-09-15 ENCOUNTER — Other Ambulatory Visit: Payer: Self-pay

## 2019-09-15 ENCOUNTER — Other Ambulatory Visit: Payer: Self-pay | Admitting: Internal Medicine

## 2019-09-15 DIAGNOSIS — R310 Gross hematuria: Secondary | ICD-10-CM

## 2019-09-15 MED ORDER — IOHEXOL 300 MG/ML  SOLN
80.0000 mL | Freq: Once | INTRAMUSCULAR | Status: AC | PRN
  Administered 2019-09-15: 80 mL via INTRAVENOUS

## 2019-09-22 ENCOUNTER — Encounter: Payer: Self-pay | Admitting: Neurology

## 2019-09-22 ENCOUNTER — Ambulatory Visit (INDEPENDENT_AMBULATORY_CARE_PROVIDER_SITE_OTHER): Payer: Medicare HMO | Admitting: Neurology

## 2019-09-22 ENCOUNTER — Other Ambulatory Visit: Payer: Self-pay

## 2019-09-22 VITALS — BP 139/67 | HR 63 | Ht 64.0 in | Wt 108.1 lb

## 2019-09-22 DIAGNOSIS — F0150 Vascular dementia without behavioral disturbance: Secondary | ICD-10-CM

## 2019-09-22 DIAGNOSIS — F0390 Unspecified dementia without behavioral disturbance: Secondary | ICD-10-CM

## 2019-09-22 DIAGNOSIS — Z87790 Personal history of (corrected) congenital malformations of face and neck: Secondary | ICD-10-CM

## 2019-09-22 DIAGNOSIS — R51 Headache with orthostatic component, not elsewhere classified: Secondary | ICD-10-CM | POA: Diagnosis not present

## 2019-09-22 DIAGNOSIS — F09 Unspecified mental disorder due to known physiological condition: Secondary | ICD-10-CM | POA: Diagnosis not present

## 2019-09-22 MED ORDER — DONEPEZIL HCL 10 MG PO TABS: ORAL_TABLET | ORAL | 3 refills | Status: DC

## 2019-09-22 NOTE — Patient Instructions (Signed)
1. Continue Donepezil 10mg : take 1 tablet daily  2. No further driving recommended  3. Follow-up in 8 months, call for any changes
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
## 2019-09-23 ENCOUNTER — Encounter: Payer: Self-pay | Admitting: Gynecology

## 2019-10-22 ENCOUNTER — Encounter: Payer: Self-pay | Admitting: Internal Medicine

## 2019-11-16 DIAGNOSIS — R310 Gross hematuria: Secondary | ICD-10-CM | POA: Diagnosis not present

## 2019-11-16 DIAGNOSIS — D414 Neoplasm of uncertain behavior of bladder: Secondary | ICD-10-CM | POA: Diagnosis not present

## 2019-11-17 ENCOUNTER — Other Ambulatory Visit: Payer: Self-pay | Admitting: Urology

## 2019-11-18 ENCOUNTER — Telehealth: Payer: Self-pay | Admitting: Internal Medicine

## 2019-11-18 NOTE — Telephone Encounter (Signed)
Yes, she will need an EKG  TJ

## 2019-11-18 NOTE — Telephone Encounter (Signed)
Selita from [facility] called to get a Cardiac clearance for patient who will soon be undergoing surgery . They always need to know who the Cardiologist is for patient   Call back number  [phone]  Ext [phone]

## 2019-11-18 NOTE — Telephone Encounter (Signed)
Do you want to see patient before clearing her for surgery?   Last EKG was 2018   LOV with PCP was 07/2019

## 2019-11-19 NOTE — Telephone Encounter (Signed)
Can you call her and set an appt for me please?

## 2019-11-19 NOTE — Telephone Encounter (Signed)
She would like to wait and have this done while she is here for her follow up on 12/16.

## 2019-11-30 ENCOUNTER — Ambulatory Visit: Payer: Medicare HMO | Admitting: Internal Medicine

## 2019-12-01 ENCOUNTER — Other Ambulatory Visit: Payer: Self-pay | Admitting: Urology
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
## 2019-12-02 ENCOUNTER — Ambulatory Visit: Payer: Medicare HMO

## 2019-12-02 ENCOUNTER — Encounter: Payer: Self-pay | Admitting: Internal Medicine

## 2019-12-02 ENCOUNTER — Other Ambulatory Visit: Payer: Self-pay

## 2019-12-02 ENCOUNTER — Other Ambulatory Visit (INDEPENDENT_AMBULATORY_CARE_PROVIDER_SITE_OTHER): Payer: Medicare HMO

## 2019-12-02 ENCOUNTER — Ambulatory Visit (INDEPENDENT_AMBULATORY_CARE_PROVIDER_SITE_OTHER): Payer: Medicare HMO | Admitting: Internal Medicine

## 2019-12-02 ENCOUNTER — Ambulatory Visit: Payer: Medicare HMO | Admitting: Cardiovascular Disease

## 2019-12-02 VITALS — BP 138/82 | HR 63 | Temp 97.9°F | Ht 64.0 in | Wt 108.0 lb

## 2019-12-02 DIAGNOSIS — I10 Essential (primary) hypertension: Secondary | ICD-10-CM

## 2019-12-02 DIAGNOSIS — E039 Hypothyroidism, unspecified: Secondary | ICD-10-CM | POA: Diagnosis not present

## 2019-12-02 DIAGNOSIS — K625 Hemorrhage of anus and rectum: Secondary | ICD-10-CM

## 2019-12-02 DIAGNOSIS — R001 Bradycardia, unspecified: Secondary | ICD-10-CM | POA: Insufficient documentation

## 2019-12-02 LAB — CBC WITH DIFFERENTIAL/PLATELET
Basophils Absolute: 0 10*3/uL (ref 0.0–0.1)
Basophils Relative: 0.4 % (ref 0.0–3.0)
Eosinophils Absolute: 0.2 10*3/uL (ref 0.0–0.7)
Eosinophils Relative: 1.6 % (ref 0.0–5.0)
HCT: 41.7 % (ref 36.0–46.0)
Hemoglobin: 13.8 g/dL (ref 12.0–15.0)
Lymphocytes Relative: 17.7 % (ref 12.0–46.0)
Lymphs Abs: 1.6 10*3/uL (ref 0.7–4.0)
MCHC: 33 g/dL (ref 30.0–36.0)
MCV: 90.3 fl (ref 78.0–100.0)
Monocytes Absolute: 0.5 10*3/uL (ref 0.1–1.0)
Monocytes Relative: 5.3 % (ref 3.0–12.0)
Neutro Abs: 6.9 10*3/uL (ref 1.4–7.7)
Neutrophils Relative %: 75 % (ref 43.0–77.0)
Platelets: 225 10*3/uL (ref 150.0–400.0)
RBC: 4.62 Mil/uL (ref 3.87–5.11)
RDW: 13.4 % (ref 11.5–15.5)
WBC: 9.3 10*3/uL (ref 4.0–10.5)

## 2019-12-02 LAB — BASIC METABOLIC PANEL
BUN: 11 mg/dL (ref 6–23)
CO2: 29 mEq/L (ref 19–32)
Calcium: 9.3 mg/dL (ref 8.4–10.5)
Chloride: 103 mEq/L (ref 96–112)
Creatinine, Ser: 1.16 mg/dL (ref 0.40–1.20)
GFR: 46.21 mL/min — ABNORMAL LOW (ref >60.00)
Glucose, Bld: 77 mg/dL (ref 70–99)
Potassium: 4.2 mEq/L (ref 3.5–5.1)
Sodium: 140 mEq/L (ref 135–145)

## 2019-12-02 LAB — TSH: TSH: 4.2 u[IU]/mL (ref 0.35–4.50)

## 2019-12-02 NOTE — Patient Instructions (Signed)

## 2019-12-02 NOTE — Progress Notes (Signed)
Subjective:  Patient ID: Michelle [name], female    DOB: 04/20/1951  Age: 68 y.o. MRN: [id number]  CC: Hypertension and Hypothyroidism   This visit occurred during the SARS-CoV-2 public health emergency.  Safety protocols were in place, including screening questions prior to the visit, additional usage of staff PPE, and extensive cleaning of exam room while observing appropriate contact time as indicated for disinfecting solutions.    HPI Michelle [name] presents for f/up - She needs a preop clearance.  Her urologist is concerned that she has bladder cancer and they need to do a procedure.  She is active and denies any recent episodes of palpitations, dizziness, lightheadedness, near syncope, diaphoresis, or fatigue.  She complains of unexplained weight loss.  She has also noticed hematuria and hematochezia.  She denies abdominal pain, nausea, or vomiting.  Outpatient Medications Prior to Visit  Medication Sig Dispense Refill  . amitriptyline (ELAVIL) 50 MG tablet TAKE 1 TABLET BY MOUTH EVERYDAY AT BEDTIME (Patient taking differently: Take 50 mg by mouth at bedtime as needed for sleep. ) 90 tablet 1  . aspirin 81 MG chewable tablet Chew 81 mg by mouth daily.    . betamethasone dipropionate (DIPROLENE) 0.05 % cream APPLY ON THE SKIN TWICE A DAY AS NEEDED FOR AREAS ON BODY  2  . cetirizine (ZYRTEC) 10 MG tablet TAKE 1 TABLET BY MOUTH TWICE A DAY 180 tablet 1  . cholecalciferol (VITAMIN D) 1000 UNITS tablet Take 1,000 Units by mouth daily.     Marland Kitchen levothyroxine (SYNTHROID) 50 MCG tablet Take 1 tablet (50 mcg total) by mouth daily. 90 tablet 1  . linaclotide (LINZESS) 72 MCG capsule Take 1 capsule (72 mcg total) by mouth daily before breakfast. 126 capsule 0  . Omega-3 Fatty Acids (FISH OIL) 1000 MG CAPS Take 2 capsules by mouth daily.     . sertraline (ZOLOFT) 100 MG tablet Take 1 tablet (100 mg total) by mouth daily. 90 tablet 1  . simvastatin (ZOCOR) 10 MG tablet TAKE 1 TABLET BY MOUTH EVERY  DAY 90 tablet 1  . torsemide (DEMADEX) 10 MG tablet TAKE 1 TABLET BY MOUTH EVERY DAY 90 tablet 1  . triamcinolone ointment (KENALOG) 0.1 % 1 APPLICATION APPLY ON THE SKIN THREE TIMES A DAY PT TO USE 2-3 TIMES DAILY.  2  . donepezil (ARICEPT) 10 MG tablet Take 1 tablet daily 90 tablet 3   No facility-administered medications prior to visit.    ROS Review of Systems  Constitutional: Positive for unexpected weight change. Negative for appetite change, chills, diaphoresis, fatigue and fever.  HENT: Negative for trouble swallowing.   Eyes: Negative.   Respiratory: Negative for cough, chest tightness, shortness of breath and wheezing.   Cardiovascular: Negative for chest pain, palpitations and leg swelling.  Gastrointestinal: Positive for anal bleeding and blood in stool. Negative for abdominal pain, diarrhea, nausea and vomiting.  Endocrine: Negative.  Negative for cold intolerance and heat intolerance.  Genitourinary: Positive for hematuria. Negative for decreased urine volume, difficulty urinating, dysuria and urgency.  Musculoskeletal: Negative.   Skin: Negative.  Negative for color change.  Neurological: Negative for dizziness, syncope, weakness, light-headedness and headaches.  Hematological: Negative for adenopathy. Does not bruise/bleed easily.  Psychiatric/Behavioral: Negative.     Objective:  BP 138/82 (BP Location: Left Arm, Patient Position: Sitting, Cuff Size: Normal)   Pulse 63   Temp 97.9 F (36.6 C) (Oral)   Ht 5\' 4"  (1.626 m)   Wt 108 lb (49 kg)  SpO2 94%   BMI 18.54 kg/m   BP Readings from Last 3 Encounters:  12/02/19 138/82  09/22/19 139/67  08/17/19 124/88    Wt Readings from Last 3 Encounters:  12/02/19 108 lb (49 kg)  09/22/19 108 lb 2 oz (49 kg)  08/17/19 104 lb 9.6 oz (47.4 kg)    Physical Exam Vitals reviewed.  Constitutional:      Appearance: Normal appearance.  HENT:     Nose: Nose normal.     Mouth/Throat:     Mouth: Mucous membranes are  moist.  Eyes:     General: No scleral icterus.    Conjunctiva/sclera: Conjunctivae normal.  Cardiovascular:     Rate and Rhythm: Regular rhythm. Bradycardia present.     Heart sounds: Normal heart sounds. No murmur. No gallop.   Pulmonary:     Effort: Pulmonary effort is normal.     Breath sounds: No stridor. No wheezing, rhonchi or rales.  Chest:     Comments: EKG --  Junctional  Bradycardia  P:QRS - 1:1, Superior P axis, Short PRi, H Rate 55 -Right bundle branch block.   -Anteroseptal infarct -age undetermined.   ABNORMAL - Her heart rate was 74 and an EKG done about 2 years ago Abdominal:     General: Abdomen is flat. Bowel sounds are normal. There is no distension.     Palpations: Abdomen is soft. There is no hepatomegaly, splenomegaly or mass.     Tenderness: There is no abdominal tenderness.  Musculoskeletal:     Cervical back: Neck supple.     Right lower leg: No edema.     Left lower leg: No edema.  Lymphadenopathy:     Cervical: No cervical adenopathy.  Skin:    General: Skin is warm.     Coloration: Skin is not pale.  Neurological:     General: No focal deficit present.     Mental Status: She is alert.  Psychiatric:        Mood and Affect: Mood normal.     Lab Results  Component Value Date   WBC 9.3 12/02/2019   HGB 13.8 12/02/2019   HCT 41.7 12/02/2019   PLT 225.0 12/02/2019   GLUCOSE 77 12/02/2019   CHOL 189 01/28/2019   TRIG 161.0 (H) 01/28/2019   HDL 42.20 01/28/2019   LDLDIRECT 113 (H) 06/16/2008   LDLCALC 115 (H) 01/28/2019   ALT 7 01/28/2018   AST 19 01/28/2018   NA 140 12/02/2019   K 4.2 12/02/2019   CL 103 12/02/2019   CREATININE 1.16 12/02/2019   BUN 11 12/02/2019   CO2 29 12/02/2019   TSH 4.20 12/02/2019   INR 1.10 08/07/2017    CT ABDOMEN W WO CONTRAST  Result Date: 09/15/2019 CLINICAL DATA:  Gross hematuria. EXAM: CT ABDOMEN WITHOUT AND WITH CONTRAST TECHNIQUE: Multidetector CT imaging of the abdomen was performed following the  standard protocol before and following the bolus administration of intravenous contrast. CONTRAST:  22mL OMNIPAQUE IOHEXOL 300 MG/ML  SOLN COMPARISON:  08/08/2017 and 06/27/2016 FINDINGS: Lower chest: No acute findings. Gross hematuria. Stable tiny sub-cm pulmonary nodules in both lung bases, consistent with benign postinflammatory etiology. Hepatobiliary: No hepatic masses identified. Focal fatty infiltration noted adjacent to the falciform ligament. Gallbladder is unremarkable. No evidence of biliary ductal dilatation. Pancreas:  No mass or inflammatory changes. Spleen:  Within normal limits in size and appearance. Adrenals/Urinary Tract: Tiny sub-cm cysts in the upper pole of left kidney are stable. Mild bilateral renal parenchymal  scarring is unchanged. No masses identified. No evidence of nephrolithiasis or hydronephrosis. Stomach/Bowel: Visualized portion unremarkable. Vascular/Lymphatic: No pathologically enlarged lymph nodes identified. No abdominal aortic aneurysm. Aortic atherosclerosis. Other:  None. Musculoskeletal:  No suspicious bone lesions identified. IMPRESSION: Stable exam. No radiographic evidence of renal neoplasm, nephrolithiasis, or hydronephrosis. Aortic Atherosclerosis (ICD10-I70.0). Electronically Signed   By: Marlaine Hind M.D.   On: 09/15/2019 15:13    Assessment & Plan:   Temya was seen today for hypertension and hypothyroidism.  Diagnoses and all orders for this visit:  Essential hypertension- Her blood pressure is adequately well controlled. -     Basic metabolic panel; Future -     CBC with Differential; Future -     EKG 12-Lead  Acquired hypothyroidism- Her TSH is in the normal range.  She will remain on the current dose of levothyroxine. -     TSH; Future  BRBPR (bright red blood per rectum) -     Ambulatory referral to Gastroenterology -     CBC with Differential; Future  Junctional bradycardia- She has developed junctional bradycardia.  Fortunately, she is  asymptomatic with respect to this.  I am concerned that donezepil may have contributed to this so I have discussed this with her neurologist and we have recommended that she will stop taking this.  She will have to see cardiology before she can undergo general anesthesia. -     Ambulatory referral to Cardiology   I have discontinued Bonnita Nasuti I. Forgette's donepezil. I am also having her maintain her cholecalciferol, betamethasone dipropionate, torsemide, triamcinolone ointment, sertraline, amitriptyline, aspirin, Fish Oil, cetirizine, simvastatin, linaclotide, and levothyroxine.  No orders of the defined types were placed in this encounter.    Follow-up: Return in about 6 weeks (around 01/13/2020).  [name], MD

## 2019-12-03 ENCOUNTER — Encounter: Payer: Self-pay | Admitting: Internal Medicine

## 2019-12-04 ENCOUNTER — Ambulatory Visit (INDEPENDENT_AMBULATORY_CARE_PROVIDER_SITE_OTHER): Payer: Medicare HMO | Admitting: Cardiology

## 2019-12-04 ENCOUNTER — Encounter: Payer: Self-pay | Admitting: Cardiology

## 2019-12-04 ENCOUNTER — Other Ambulatory Visit: Payer: Self-pay

## 2019-12-04 VITALS — BP 126/79 | HR 67 | Ht 64.0 in | Wt 113.0 lb

## 2019-12-04 DIAGNOSIS — Z8774 Personal history of (corrected) congenital malformations of heart and circulatory system: Secondary | ICD-10-CM | POA: Diagnosis not present

## 2019-12-04 DIAGNOSIS — Z9889 Other specified postprocedural states: Secondary | ICD-10-CM

## 2019-12-04 DIAGNOSIS — Z952 Presence of prosthetic heart valve: Secondary | ICD-10-CM

## 2019-12-04 DIAGNOSIS — I4510 Unspecified right bundle-branch block: Secondary | ICD-10-CM | POA: Diagnosis not present

## 2019-12-04 DIAGNOSIS — Q249 Congenital malformation of heart, unspecified: Secondary | ICD-10-CM

## 2019-12-04 DIAGNOSIS — Z01810 Encounter for preprocedural cardiovascular examination: Secondary | ICD-10-CM | POA: Diagnosis not present

## 2019-12-04 DIAGNOSIS — R9431 Abnormal electrocardiogram [ECG] [EKG]: Secondary | ICD-10-CM

## 2019-12-04 NOTE — Patient Instructions (Signed)
Medication Instructions:  NO CHANGES *If you need a refill on your cardiac medications before your next appointment, please call your pharmacy*  Lab Work: NONE NEEDED If you have labs (blood work) drawn today and your tests are completely normal, you will receive your results only by: Marland Kitchen MyChart Message (if you have MyChart) OR . A paper copy in the mail If you have any lab test that is abnormal or we need to change your treatment, we will call you to review the results.  Testing/Procedures: NONE NEEDED  Follow-Up: Your physician recommends you follow up with Dr. Corine Shelter
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
## 2019-12-07 DIAGNOSIS — Q213 Tetralogy of Fallot: Secondary | ICD-10-CM | POA: Diagnosis not present

## 2019-12-07 DIAGNOSIS — I371 Nonrheumatic pulmonary valve insufficiency: Secondary | ICD-10-CM | POA: Diagnosis not present

## 2019-12-07 DIAGNOSIS — R0609 Other forms of dyspnea: Secondary | ICD-10-CM | POA: Diagnosis not present

## 2019-12-07 DIAGNOSIS — R3129 Other microscopic hematuria: Secondary | ICD-10-CM | POA: Diagnosis not present

## 2019-12-07 DIAGNOSIS — Z23 Encounter for immunization: Secondary | ICD-10-CM | POA: Diagnosis not present

## 2019-12-07 DIAGNOSIS — E785 Hyperlipidemia, unspecified: Secondary | ICD-10-CM | POA: Diagnosis not present

## 2019-12-07 DIAGNOSIS — I119 Hypertensive heart disease without heart failure: Secondary | ICD-10-CM | POA: Diagnosis not present

## 2019-12-07 DIAGNOSIS — Z01810 Encounter for preprocedural cardiovascular examination: Secondary | ICD-10-CM | POA: Diagnosis not present

## 2019-12-07 DIAGNOSIS — I4510 Unspecified right bundle-branch block: Secondary | ICD-10-CM | POA: Diagnosis not present

## 2019-12-07 DIAGNOSIS — R9431 Abnormal electrocardiogram [ECG] [EKG]: Secondary | ICD-10-CM | POA: Diagnosis not present

## 2019-12-07 DIAGNOSIS — I083 Combined rheumatic disorders of mitral, aortic and tricuspid valves: Secondary | ICD-10-CM | POA: Diagnosis not present

## 2019-12-09 ENCOUNTER — Other Ambulatory Visit: Payer: Self-pay

## 2019-12-09 ENCOUNTER — Encounter (HOSPITAL_BASED_OUTPATIENT_CLINIC_OR_DEPARTMENT_OTHER): Payer: Self-pay | Admitting: Urology

## 2019-12-09 NOTE — Progress Notes (Signed)
Spoke w/ via phone for pre-op interview---Nikitha Lab needs dos----       I stat 8       Lab results------ COVID test ------12-12-19 Arrive at -------1100 am 12-15-29 NPO after ------midnight food, clear liquids  Medications to take morning of surgery -----sertraline, certrizine, simvastatin, levothyroxine Diabetic medication -----n/a Patient Special Instructions ----- Pre-Op special Istructions ----- Patient verbalized understanding of instructions that were given at this phone interview. Patient denies shortness of breath, chest pain, fever, cough a this phone interview.  Anesthesia Review:secure chat to Janett Billow zanetto pa PCP: dr Ronnald Ramp Cardiologist : cardiac clearance dr christoper12-18-20 chart/epic Chest x-ray :none EKG :12-04-19 Echo :none Cardiac Cath : none Sleep Study/ CPAP :none Fasting Blood Sugar :      / Checks Blood Sugar -- times a day:  n/a Blood Thinner/ Instructions /Last Dose:n/a ASA / Instructions/ Last Dose : 81 mg aspirin last dose 12-08-19  Patient denies shortness of breath, chest pain, fever, and cough at this phone interview.
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
## 2019-12-12 ENCOUNTER — Other Ambulatory Visit (HOSPITAL_COMMUNITY)
Admission: RE | Admit: 2019-12-12 | Discharge: 2019-12-12 | Disposition: A | Discharge status: Home / Self Care | Payer: Medicare HMO | Source: Ambulatory Visit | Attending: Urology | Admitting: Urology

## 2019-12-12 DIAGNOSIS — Z01812 Encounter for preprocedural laboratory examination: Secondary | ICD-10-CM | POA: Diagnosis not present

## 2019-12-12 DIAGNOSIS — Z20828 Contact with and (suspected) exposure to other viral communicable diseases: Secondary | ICD-10-CM | POA: Diagnosis not present

## 2019-12-13 LAB — NOVEL CORONAVIRUS, NAA (HOSP ORDER, SEND-OUT TO REF LAB; TAT 18-24 HRS): SARS-CoV-2, NAA: NOT DETECTED

## 2019-12-15 NOTE — Progress Notes (Addendum)
Chart reviewed by anesthesia, Konrad Felix PA,  Refer to her progress note. Pt's had another cardiology clearance from [facility], dated 12-07-2019 and echo done same day, placed copy in chart.

## 2019-12-15 NOTE — Anesthesia Preprocedure Evaluation (Addendum)
Anesthesia Evaluation  Patient identified by MRN, date of birth, ID band Patient awake    Reviewed: Allergy & Precautions, NPO status , Patient's Chart, lab work & pertinent test results  Airway Mallampati: II  TM Distance: >3 FB Neck ROM: Full    Dental  (+) Edentulous Upper   Pulmonary neg pulmonary ROS, former smoker,    Pulmonary exam normal breath sounds clear to auscultation       Cardiovascular hypertension, Pt. on medications negative cardio ROS Normal cardiovascular exam Rhythm:Regular Rate:Normal     Neuro/Psych Anxiety Depression negative neurological ROS  negative psych ROS   GI/Hepatic negative GI ROS, Neg liver ROS,   Endo/Other  Hypothyroidism   Renal/GU negative Renal ROS  negative genitourinary   Musculoskeletal  (+) Arthritis , Osteoarthritis,    Abdominal   Peds negative pediatric ROS (+)  Hematology negative hematology ROS (+)   Anesthesia Other Findings   Reproductive/Obstetrics negative OB ROS                           Anesthesia Physical Anesthesia Plan  ASA: II  Anesthesia Plan: General   Post-op Pain Management:    Induction: Intravenous  PONV Risk Score and Plan: 3 and Ondansetron, Dexamethasone, Midazolam and Treatment may vary due to age or medical condition  Airway Management Planned: LMA  Additional Equipment:   Intra-op Plan:   Post-operative Plan: Extubation in OR  Informed Consent: I have reviewed the patients History and Physical, chart, labs and discussed the procedure including the risks, benefits and alternatives for the proposed anesthesia with the patient or authorized representative who has indicated his/her understanding and acceptance.     Dental advisory given  Plan Discussed with: CRNA  Anesthesia Plan Comments: (See PAT note 12/10/2019, Konrad Felix, [name])       Anesthesia Quick Evaluation

## 2019-12-16 ENCOUNTER — Encounter (HOSPITAL_BASED_OUTPATIENT_CLINIC_OR_DEPARTMENT_OTHER): Payer: Self-pay | Admitting: Urology

## 2019-12-16 ENCOUNTER — Ambulatory Visit (HOSPITAL_BASED_OUTPATIENT_CLINIC_OR_DEPARTMENT_OTHER): Payer: Medicare HMO | Admitting: Physician Assistant

## 2019-12-16 ENCOUNTER — Other Ambulatory Visit: Payer: Self-pay

## 2019-12-16 ENCOUNTER — Encounter (HOSPITAL_BASED_OUTPATIENT_CLINIC_OR_DEPARTMENT_OTHER)
Admission: RE | Disposition: A | Discharge status: Home / Self Care | Payer: Self-pay | Source: Home / Self Care | Attending: Urology

## 2019-12-16 ENCOUNTER — Ambulatory Visit (HOSPITAL_BASED_OUTPATIENT_CLINIC_OR_DEPARTMENT_OTHER)
Admission: RE | Admit: 2019-12-16 | Discharge: 2019-12-16 | Disposition: A | Discharge status: Home / Self Care | Payer: Medicare HMO | Attending: Urology | Admitting: Urology

## 2019-12-16 DIAGNOSIS — N3080 Other cystitis without hematuria: Secondary | ICD-10-CM | POA: Insufficient documentation

## 2019-12-16 DIAGNOSIS — Z880 Allergy status to penicillin: Secondary | ICD-10-CM | POA: Insufficient documentation

## 2019-12-16 DIAGNOSIS — I10 Essential (primary) hypertension: Secondary | ICD-10-CM | POA: Insufficient documentation

## 2019-12-16 DIAGNOSIS — M1909 Primary osteoarthritis, other specified site: Secondary | ICD-10-CM | POA: Insufficient documentation

## 2019-12-16 DIAGNOSIS — Z952 Presence of prosthetic heart valve: Secondary | ICD-10-CM | POA: Diagnosis not present

## 2019-12-16 DIAGNOSIS — Z79899 Other long term (current) drug therapy: Secondary | ICD-10-CM | POA: Diagnosis not present

## 2019-12-16 DIAGNOSIS — E038 Other specified hypothyroidism: Secondary | ICD-10-CM | POA: Diagnosis not present

## 2019-12-16 DIAGNOSIS — Z79890 Hormone replacement therapy (postmenopausal): Secondary | ICD-10-CM | POA: Diagnosis not present

## 2019-12-16 DIAGNOSIS — F329 Major depressive disorder, single episode, unspecified: Secondary | ICD-10-CM | POA: Diagnosis not present

## 2019-12-16 DIAGNOSIS — E785 Hyperlipidemia, unspecified: Secondary | ICD-10-CM | POA: Insufficient documentation

## 2019-12-16 DIAGNOSIS — Z888 Allergy status to other drugs, medicaments and biological substances status: Secondary | ICD-10-CM | POA: Insufficient documentation

## 2019-12-16 DIAGNOSIS — F419 Anxiety disorder, unspecified: Secondary | ICD-10-CM | POA: Diagnosis not present

## 2019-12-16 DIAGNOSIS — Z87891 Personal history of nicotine dependence: Secondary | ICD-10-CM | POA: Diagnosis not present

## 2019-12-16 DIAGNOSIS — Z7982 Long term (current) use of aspirin: Secondary | ICD-10-CM | POA: Diagnosis not present

## 2019-12-16 DIAGNOSIS — N3289 Other specified disorders of bladder: Secondary | ICD-10-CM | POA: Diagnosis not present

## 2019-12-16 DIAGNOSIS — M810 Age-related osteoporosis without current pathological fracture: Secondary | ICD-10-CM | POA: Diagnosis not present

## 2019-12-16 DIAGNOSIS — E039 Hypothyroidism, unspecified: Secondary | ICD-10-CM | POA: Diagnosis not present

## 2019-12-16 HISTORY — PX: CYSTOSCOPY WITH BIOPSY: SHX5122

## 2019-12-16 HISTORY — PX: CYSTOSCOPY W/ RETROGRADES: SHX1426

## 2019-12-16 LAB — POCT I-STAT, CHEM 8
BUN: 12 mg/dL (ref 8–23)
Calcium, Ion: 1.18 mmol/L (ref 1.15–1.40)
Chloride: 104 mmol/L (ref 98–111)
Creatinine, Ser: 1.1 mg/dL — ABNORMAL HIGH (ref 0.44–1.00)
Glucose, Bld: 84 mg/dL (ref 70–99)
HCT: 43 % (ref 36.0–46.0)
Hemoglobin: 14.6 g/dL (ref 12.0–15.0)
Potassium: 4.1 mmol/L (ref 3.5–5.1)
Sodium: 141 mmol/L (ref 135–145)
TCO2: 28 mmol/L (ref 22–32)

## 2019-12-16 SURGERY — CYSTOSCOPY, WITH BIOPSY
Anesthesia: General

## 2019-12-16 MED ORDER — GENTAMICIN SULFATE 40 MG/ML IJ SOLN
5.0000 mg/kg | INTRAVENOUS | Status: AC
  Administered 2019-12-16: 14:00:00 343 mg via INTRAVENOUS
  Filled 2019-12-16 (×2): qty 6.25

## 2019-12-16 MED ORDER — PROPOFOL 10 MG/ML IV BOLUS
INTRAVENOUS | Status: DC | PRN
  Administered 2019-12-16: 140 mg via INTRAVENOUS

## 2019-12-16 MED ORDER — PROPOFOL 10 MG/ML IV BOLUS
INTRAVENOUS | Status: AC
  Filled 2019-12-16: qty 20

## 2019-12-16 MED ORDER — ONDANSETRON HCL 4 MG/2ML IJ SOLN
INTRAMUSCULAR | Status: DC | PRN
  Administered 2019-12-16: 4 mg via INTRAVENOUS

## 2019-12-16 MED ORDER — IOHEXOL 300 MG/ML  SOLN
INTRAMUSCULAR | Status: DC | PRN
  Administered 2019-12-16: 18 mL via URETHRAL

## 2019-12-16 MED ORDER — PROMETHAZINE HCL 25 MG/ML IJ SOLN
6.2500 mg | INTRAMUSCULAR | Status: DC | PRN
  Filled 2019-12-16: qty 1

## 2019-12-16 MED ORDER — MIDAZOLAM HCL 2 MG/2ML IJ SOLN
INTRAMUSCULAR | Status: AC
  Filled 2019-12-16: qty 2

## 2019-12-16 MED ORDER — LACTATED RINGERS IV SOLN
INTRAVENOUS | Status: DC
  Filled 2019-12-16: qty 1000

## 2019-12-16 MED ORDER — LIDOCAINE 2% (20 MG/ML) 5 ML SYRINGE
INTRAMUSCULAR | Status: DC | PRN
  Administered 2019-12-16: 50 mg via INTRAVENOUS

## 2019-12-16 MED ORDER — ONDANSETRON HCL 4 MG/2ML IJ SOLN
INTRAMUSCULAR | Status: AC
  Filled 2019-12-16: qty 2

## 2019-12-16 MED ORDER — OXYCODONE HCL 5 MG PO TABS
5.0000 mg | ORAL_TABLET | Freq: Once | ORAL | Status: DC | PRN
  Filled 2019-12-16: qty 1

## 2019-12-16 MED ORDER — HYDROMORPHONE HCL 1 MG/ML IJ SOLN
0.2500 mg | INTRAMUSCULAR | Status: DC | PRN
  Filled 2019-12-16: qty 0.5

## 2019-12-16 MED ORDER — OXYCODONE HCL 5 MG/5ML PO SOLN
5.0000 mg | Freq: Once | ORAL | Status: DC | PRN
  Filled 2019-12-16: qty 5

## 2019-12-16 MED ORDER — LIDOCAINE 2% (20 MG/ML) 5 ML SYRINGE
INTRAMUSCULAR | Status: AC
  Filled 2019-12-16: qty 5

## 2019-12-16 MED ORDER — TRAMADOL HCL 50 MG PO TABS: 50.0000 mg | ORAL_TABLET | Freq: Four times a day (QID) | ORAL | 0 refills | Status: DC | PRN

## 2019-12-16 MED ORDER — FENTANYL CITRATE (PF) 100 MCG/2ML IJ SOLN
INTRAMUSCULAR | Status: AC
  Filled 2019-12-16: qty 2

## 2019-12-16 MED ORDER — FENTANYL CITRATE (PF) 100 MCG/2ML IJ SOLN
INTRAMUSCULAR | Status: DC | PRN
  Administered 2019-12-16: 25 ug via INTRAVENOUS

## 2019-12-16 SURGICAL SUPPLY — 25 items
BAG DRAIN URO-CYSTO SKYTR STRL (DRAIN) ×3 IMPLANT
BAG DRN UROCATH (DRAIN) ×2
CATH INTERMIT  6FR 70CM (CATHETERS) ×3 IMPLANT
CATH ROBINSON RED A/P 14FR (CATHETERS) IMPLANT
CLOTH BEACON ORANGE TIMEOUT ST (SAFETY) ×3 IMPLANT
ELECT REM PT RETURN 9FT ADLT (ELECTROSURGICAL) ×3
ELECTRODE REM PT RTRN 9FT ADLT (ELECTROSURGICAL) ×2 IMPLANT
GLOVE BIO SURGEON STRL SZ7.5 (GLOVE) ×3 IMPLANT
GOWN STRL REUS W/ TWL LRG LVL3 (GOWN DISPOSABLE) ×6 IMPLANT
GOWN STRL REUS W/TWL LRG LVL3 (GOWN DISPOSABLE) ×12 IMPLANT
GUIDEWIRE ANG ZIPWIRE 038X150 (WIRE) IMPLANT
GUIDEWIRE STR DUAL SENSOR (WIRE) IMPLANT
IV NS 1000ML (IV SOLUTION) ×3
IV NS 1000ML BAXH (IV SOLUTION) ×2 IMPLANT
IV NS IRRIG 3000ML ARTHROMATIC (IV SOLUTION) ×3 IMPLANT
KIT TURNOVER CYSTO (KITS) ×3 IMPLANT
MANIFOLD NEPTUNE II (INSTRUMENTS) ×3 IMPLANT
NDL HYPO 18GX1.5 BLUNT FILL (NEEDLE) IMPLANT
NEEDLE HYPO 18GX1.5 BLUNT FILL (NEEDLE) IMPLANT
NS IRRIG 500ML POUR BTL (IV SOLUTION) ×3 IMPLANT
PACK CYSTO (CUSTOM PROCEDURE TRAY) ×3 IMPLANT
SYR 10ML LL (SYRINGE) IMPLANT
SYR 20ML LL LF (SYRINGE) IMPLANT
TUBE CONNECTING 12X1/4 (SUCTIONS) IMPLANT
WATER STERILE IRR 3000ML UROMA (IV SOLUTION) ×3 IMPLANT

## 2019-12-16 NOTE — Brief Op Note (Signed)
12/16/2019  1:48 PM  PATIENT:  Michelle [name]  68 y.o. female  PRE-OPERATIVE DIAGNOSIS:  SMALL BLADDER LESION  POST-OPERATIVE DIAGNOSIS:  SMALL BLADDER LESION  PROCEDURE:  Procedure(s) with comments: CYSTOSCOPY WITH BIOPSY (N/A) - 45 MINS CYSTOSCOPY WITH RETROGRADE PYELOGRAM (Bilateral)  SURGEON:  Surgeon(s) and Role:    * Alexis Frock, MD - Primary  PHYSICIAN ASSISTANT:   ASSISTANTS: none   ANESTHESIA:   general  EBL:  minimal   BLOOD ADMINISTERED:none  DRAINS: none   LOCAL MEDICATIONS USED:  NONE  SPECIMEN:  Source of Specimen:  bladder trigone  DISPOSITION OF SPECIMEN:  PATHOLOGY  COUNTS:  YES  TOURNIQUET:  * No tourniquets in log *  DICTATION: .Other Dictation: Dictation Number [id number]  PLAN OF CARE: Discharge to home after PACU  PATIENT DISPOSITION:  PACU - hemodynamically stable.   Delay start of Pharmacological VTE agent (>24hrs) due to surgical blood loss or risk of bleeding: yes

## 2019-12-16 NOTE — Discharge Instructions (Signed)
1 - You may have urinary urgency (bladder spasms) and bloody urine on / off with x few days. This is normal.  2 - Call MD or go to ER for fever >102, severe pain / nausea / vomiting not relieved by medications, or acute change in medical status   Post Anesthesia Home Care Instructions  Activity: Get plenty of rest for the remainder of the day. A responsible individual must stay with you for 24 hours following the procedure.  For the next 24 hours, DO NOT: -Drive a car -Paediatric nurse -Drink alcoholic beverages -Take any medication unless instructed by your physician -Make any legal decisions or sign important papers.  Meals: Start with liquid foods such as gelatin or soup. Progress to regular foods as tolerated. Avoid greasy, spicy, heavy foods. If nausea and/or vomiting occur, drink only clear liquids until the nausea and/or vomiting subsides. Call your physician if vomiting continues.  Special Instructions/Symptoms: Your throat may feel dry or sore from the anesthesia or the breathing tube placed in your throat during surgery. If this causes discomfort, gargle with warm salt water. The discomfort should disappear within 24 hours.  Bladder Biopsy, Care After Refer to this sheet in the next few weeks. These instructions provide you with information about caring for yourself after your procedure. Your health care provider may also give you more specific instructions. Your treatment has been planned according to current medical practices, but problems sometimes occur. Call your health care provider if you have any problems or questions after your procedure. What can I expect after the procedure? After the procedure, it is common to have:  Mild pain in your bladder or kidney area during urination.  Minor burning during urination.  Small amounts of blood in your urine.  A sudden urge to urinate.  A need to urinate more often than usual. Follow these instructions at  home: Medicines  Take over-the-counter and prescription medicines only as told by your health care provider.  If you were prescribed an antibiotic medicine, take it as told by your health care provider. Do not stop taking the antibiotic even if you start to feel better. General instructions   Take a warm bath to relieve any burning sensations around your urethra.  Hold a warm, damp washcloth over the urethral area to ease pain.  Return to your normal activities as told by your health care provider. Ask your health care provider what activities are safe for you.  Do not drive for 24 hours if you received a medicine to help you relax (sedative) during your procedure. Ask your health care provider when it is safe for you to drive.  It is your responsibility to get the results of your procedure. Ask your health care provider or the department performing the procedure when your results will be ready.  Keep all follow-up visits as told by your health care provider. This is important. Contact a health care provider if:  You have a fever.  Your symptoms do not improve within 24 hours and you continue to have: ? Burning during urination. ? Increasing amounts of blood in your urine. ? Pain during urination. ? An urgent need to urinate. ? A need to urinate more often than usual. Get help right away if:  You have a lot of bleeding or more bleeding.  You have severe pain.  You are unable to urinate.  You have bright red blood in your urine.  You are passing blood clots in your urine.  You  have a fever.  You have swelling, redness, or pain in your legs.  You have difficulty breathing. This information is not intended to replace advice given to you by your health care provider. Make sure you discuss any questions you have with your health care provider. Document Released: 12/20/2015 Document Revised: 05/10/2016 Document Reviewed: 12/20/2015 Elsevier Patient Education  2020 [employer].
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
## 2019-12-16 NOTE — Transfer of Care (Signed)
Immediate Anesthesia Transfer of Care Note  Patient: Michelle [name]  Procedure(s) Performed: CYSTOSCOPY WITH BIOPSY (N/A ) CYSTOSCOPY WITH RETROGRADE PYELOGRAM (Bilateral )  Patient Location: PACU  Anesthesia Type:General  Level of Consciousness: awake, alert  and oriented  Airway & Oxygen Therapy: Patient Spontanous Breathing and Patient connected to nasal cannula oxygen  Post-op Assessment: Report given to RN and Post -op Vital signs reviewed and stable  Post vital signs: Reviewed and stable  Last Vitals:  Vitals Value Taken Time  BP    Temp    Pulse 51 12/16/19 1359  Resp 6 12/16/19 1359  SpO2 100 % 12/16/19 1359  Vitals shown include unvalidated device data.  Last Pain:  Vitals:   12/16/19 1154  TempSrc:   PainSc: 4       Patients Stated Pain Goal: 5 ([id number])  Complications: No apparent anesthesia complications

## 2019-12-16 NOTE — Anesthesia Procedure Notes (Signed)
Procedure Name: LMA Insertion Date/Time: 12/16/2019 1:32 PM Performed by: [place] Whitnie Deleon C, CRNA Pre-anesthesia Checklist: Patient identified, Emergency Drugs available, Suction available and Patient being monitored Patient Re-evaluated:Patient Re-evaluated prior to induction Oxygen Delivery Method: Circle system utilized Preoxygenation: Pre-oxygenation with 100% oxygen Induction Type: IV induction Ventilation: Mask ventilation without difficulty LMA: LMA inserted LMA Size: 4.0 Number of attempts: 1 Airway Equipment and Method: Bite block Placement Confirmation: positive ETCO2 Tube secured with: Tape Dental Injury: Teeth and Oropharynx as per pre-operative assessment

## 2019-12-17 LAB — SURGICAL PATHOLOGY

## 2019-12-17 NOTE — Op Note (Signed)
[name], [name] MEDICAL RECORD [id number] ACCOUNT [id number] DATE OF BIRTH:1951/11/21 FACILITY: [facility] LOCATION: [facility] PHYSICIAN:Acire Tang Tresa Moore, MD  OPERATIVE REPORT  DATE OF PROCEDURE:  12/16/2019  SURGEON:  Alexis Frock, MD  PREOPERATIVE DIAGNOSES:   1.  History of hematuria. 2.  Subtle trigone abnormality.  PROCEDURES: 1.  Cystoscopy, bladder biopsy and fulguration. 2.  Bilateral pyelograms, interpretation.  ESTIMATED BLOOD LOSS:  Nil.  MEDICATIONS:  None.  SPECIMEN:  Bladder trigone area for pathology.  FINDINGS: 1.  Very subtle contour abnormality of the trigone area.  Otherwise, unremarkable bladder. 2.  Unremarkable bilateral pyelograms.  INDICATIONS:  The patient is a 68 year old lady with history of congenital heart disease.  She was found on workup of hematuria with a smoking history to have a very subtle abnormality of the anterior trigone area, not grossly consistent with traditional  papillary bladder cancer, but also not completely normal.  This area was relatively small.  Given the smoking history, it was clearly felt that a biopsy was warranted to rule out urothelial or squamous neoplasm.  That operative biopsy with fulguration  with retrogrades  warranted and she wished to proceed.  Informed consent was then placed and obtained in the medical record.  DESCRIPTION OF PROCEDURE:  The patient being identified and procedure being cystoscopy, bilateral retrogrades, bladder biopsy and fulguration was confirmed.  Procedure timeout was performed.  Intravenous antibiotics administered.  General LMA anesthesia  induced.  The patient was placed into a low lithotomy position.  Sterile field was created, prepping and draping the patient's vagina, introitus and proximal thighs using iodine.  Cystourethroscopy was performed using a 21-French rigid cystoscope with  offset lens.  Inspection of the bladder revealed no diverticula, calcifications, papillary  lesions.  In the anterior trigone area, there was very subtle mucosal abnormality with some slight mounding, not obviously papillary.  Given the patient's smoking  history, it was felt that the area of concern warranted a biopsy.  Next, cold cup biopsy forceps were used to obtain representative mucosal biopsies of the area x2.  It was set aside for permanent pathology.  The base of these areas was fulgurated with the Bugbee  electrode.  There was no evidence of bladder perforation, no evidence of injury to the ureteral orifices.  Following this, attention was directed at retrograde pyelograms.  The left ureteral orifice was cannulated with a 6-French renal catheter and left  retrograde pyelogram was obtained.  Left retrograde pyelogram demonstrated a single left ureter with single system left kidney.  No filling defects or narrowing noted.  Similarly, right pyelogram was obtained.  Right pyelogram demonstrated a single right ureter with single system right kidney.  No filling defects or narrowing noted.  Bladder was empty per cystoscope.  Procedure then terminated.  The patient tolerated the procedure well.  No immediate  perioperative complications.  The patient was taken to postanesthesia care in stable condition.  Plan for discharge home.  VN/NUANCE  D:12/16/2019 T:12/16/2019 JOB:[id number]

## 2019-12-17 NOTE — Anesthesia Postprocedure Evaluation (Signed)
Anesthesia Post Note  Patient: Michelle [name]  Procedure(s) Performed: CYSTOSCOPY WITH BIOPSY (N/A ) CYSTOSCOPY WITH RETROGRADE PYELOGRAM (Bilateral )     Patient location during evaluation: PACU Anesthesia Type: General Level of consciousness: awake and alert Pain management: pain level controlled Vital Signs Assessment: post-procedure vital signs reviewed and stable Respiratory status: spontaneous breathing, nonlabored ventilation and respiratory function stable Cardiovascular status: blood pressure returned to baseline and stable Postop Assessment: no apparent nausea or vomiting Anesthetic complications: no    Last Vitals:  Vitals:   12/16/19 1415 12/16/19 1445  BP: (!) 145/77 (!) 164/86  Pulse: (!) 54 64  Resp: (!) 9 16  Temp:  36.6 C  SpO2: 99% 97%    Last Pain:  Vitals:   12/16/19 1445  TempSrc:   PainSc: 0-No pain                 Lynda Rainwater

## 2019-12-29 DIAGNOSIS — D414 Neoplasm of uncertain behavior of bladder: Secondary | ICD-10-CM | POA: Diagnosis not present

## 2019-12-29 DIAGNOSIS — R310 Gross hematuria: Secondary | ICD-10-CM | POA: Diagnosis not present

## 2020-01-05 ENCOUNTER — Telehealth: Payer: Self-pay

## 2020-01-05 NOTE — Telephone Encounter (Signed)
Friend of pt informed me that pt is more confused than usual. Made call to check on patient and she hung up phone on me by mistake I believe. Called pt back and rang and went to vm. LVM for pt to call back.   I would like to go over her medications.

## 2020-01-12 ENCOUNTER — Encounter: Payer: Self-pay | Admitting: Internal Medicine

## 2020-01-12 ENCOUNTER — Ambulatory Visit (INDEPENDENT_AMBULATORY_CARE_PROVIDER_SITE_OTHER): Payer: Medicare HMO | Admitting: Internal Medicine

## 2020-01-12 VITALS — BP 126/78 | HR 68 | Temp 97.8°F | Ht 61.0 in | Wt 111.0 lb

## 2020-01-12 DIAGNOSIS — Z86010 Personal history of colonic polyps: Secondary | ICD-10-CM

## 2020-01-12 DIAGNOSIS — K625 Hemorrhage of anus and rectum: Secondary | ICD-10-CM | POA: Diagnosis not present

## 2020-01-12 DIAGNOSIS — R1030 Lower abdominal pain, unspecified: Secondary | ICD-10-CM | POA: Diagnosis not present

## 2020-01-13 ENCOUNTER — Ambulatory Visit (INDEPENDENT_AMBULATORY_CARE_PROVIDER_SITE_OTHER)
Admission: RE | Admit: 2020-01-13 | Discharge: 2020-01-13 | Disposition: A | Discharge status: Home / Self Care | Payer: Medicare HMO | Source: Ambulatory Visit | Attending: Internal Medicine | Admitting: Internal Medicine

## 2020-01-13 ENCOUNTER — Ambulatory Visit (INDEPENDENT_AMBULATORY_CARE_PROVIDER_SITE_OTHER): Payer: Medicare HMO | Admitting: Internal Medicine

## 2020-01-13 ENCOUNTER — Other Ambulatory Visit: Payer: Self-pay

## 2020-01-13 ENCOUNTER — Encounter: Payer: Self-pay | Admitting: Internal Medicine

## 2020-01-13 VITALS — BP 130/80 | HR 62 | Temp 98.4°F | Ht 61.0 in | Wt 109.0 lb

## 2020-01-13 DIAGNOSIS — F419 Anxiety disorder, unspecified: Secondary | ICD-10-CM

## 2020-01-13 DIAGNOSIS — E039 Hypothyroidism, unspecified: Secondary | ICD-10-CM | POA: Diagnosis not present

## 2020-01-13 DIAGNOSIS — N1832 Chronic kidney disease, stage 3b: Secondary | ICD-10-CM

## 2020-01-13 DIAGNOSIS — I10 Essential (primary) hypertension: Secondary | ICD-10-CM

## 2020-01-13 DIAGNOSIS — Q828 Other specified congenital malformations of skin: Secondary | ICD-10-CM | POA: Diagnosis not present

## 2020-01-13 DIAGNOSIS — Z0000 Encounter for general adult medical examination without abnormal findings: Secondary | ICD-10-CM | POA: Diagnosis not present

## 2020-01-13 DIAGNOSIS — M810 Age-related osteoporosis without current pathological fracture: Secondary | ICD-10-CM

## 2020-01-13 DIAGNOSIS — E785 Hyperlipidemia, unspecified: Secondary | ICD-10-CM | POA: Diagnosis not present

## 2020-01-13 DIAGNOSIS — G5791 Unspecified mononeuropathy of right lower limb: Secondary | ICD-10-CM | POA: Diagnosis not present

## 2020-01-13 DIAGNOSIS — Z1231 Encounter for screening mammogram for malignant neoplasm of breast: Secondary | ICD-10-CM

## 2020-01-13 LAB — BASIC METABOLIC PANEL
BUN: 14 mg/dL (ref 6–23)
CO2: 30 mEq/L (ref 19–32)
Calcium: 9.3 mg/dL (ref 8.4–10.5)
Chloride: 103 mEq/L (ref 96–112)
Creatinine, Ser: 1.07 mg/dL (ref 0.40–1.20)
GFR: 50.71 mL/min — ABNORMAL LOW (ref >60.00)
Glucose, Bld: 82 mg/dL (ref 70–99)
Potassium: 4.1 mEq/L (ref 3.5–5.1)
Sodium: 139 mEq/L (ref 135–145)

## 2020-01-13 LAB — LIPID PANEL
Cholesterol: 157 mg/dL (ref 0–200)
HDL: 44.2 mg/dL (ref >39.00)
LDL Cholesterol: 85 mg/dL (ref 0–99)
NonHDL: 112.94
Total CHOL/HDL Ratio: 4
Triglycerides: 139 mg/dL (ref 0.0–149.0)
VLDL: 27.8 mg/dL (ref 0.0–40.0)

## 2020-01-13 LAB — HEPATIC FUNCTION PANEL
ALT: 7 U/L (ref 0–35)
AST: 19 U/L (ref 0–37)
Albumin: 4.3 g/dL (ref 3.5–5.2)
Alkaline Phosphatase: 55 U/L (ref 39–117)
Bilirubin, Direct: 0.1 mg/dL (ref 0.0–0.3)
Total Bilirubin: 0.5 mg/dL (ref 0.2–1.2)
Total Protein: 7 g/dL (ref 6.0–8.3)

## 2020-01-13 LAB — TSH: TSH: 0.36 u[IU]/mL (ref 0.35–4.50)
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
## 2020-01-13 NOTE — Patient Instructions (Signed)
Health Maintenance, Female Adopting a healthy lifestyle and getting preventive care are important in promoting health and wellness. Ask your health care provider about:  The right schedule for you to have regular tests and exams.  Things you can do on your own to prevent diseases and keep yourself healthy. What should I know about diet, weight, and exercise? Eat a healthy diet   Eat a diet that includes plenty of vegetables, fruits, low-fat dairy products, and lean protein.  Do not eat a lot of foods that are high in solid fats, added sugars, or sodium. Maintain a healthy weight Body mass index (BMI) is used to identify weight problems. It estimates body fat based on height and weight. Your health care provider can help determine your BMI and help you achieve or maintain a healthy weight. Get regular exercise Get regular exercise. This is one of the most important things you can do for your health. Most adults should:  Exercise for at least 150 minutes each week. The exercise should increase your heart rate and make you sweat (moderate-intensity exercise).  Do strengthening exercises at least twice a week. This is in addition to the moderate-intensity exercise.  Spend less time sitting. Even light physical activity can be beneficial. Watch cholesterol and blood lipids Have your blood tested for lipids and cholesterol at 70 years of age, then have this test every 5 years. Have your cholesterol levels checked more often if:  Your lipid or cholesterol levels are high.  You are older than 70 years of age.  You are at high risk for heart disease. What should I know about cancer screening? Depending on your health history and family history, you may need to have cancer screening at various ages. This may include screening for:  Breast cancer.  Cervical cancer.  Colorectal cancer.  Skin cancer.  Lung cancer. What should I know about heart disease, diabetes, and high blood  pressure? Blood pressure and heart disease  High blood pressure causes heart disease and increases the risk of stroke. This is more likely to develop in people who have high blood pressure readings, are of African descent, or are overweight.  Have your blood pressure checked: ? Every 3-5 years if you are 18-39 years of age. ? Every year if you are 40 years old or older. Diabetes Have regular diabetes screenings. This checks your fasting blood sugar level. Have the screening done:  Once every three years after age 40 if you are at a normal weight and have a low risk for diabetes.  More often and at a younger age if you are overweight or have a high risk for diabetes. What should I know about preventing infection? Hepatitis B If you have a higher risk for hepatitis B, you should be screened for this virus. Talk with your health care provider to find out if you are at risk for hepatitis B infection. Hepatitis C Testing is recommended for:  Everyone born from 1945 through 1965.  Anyone with known risk factors for hepatitis C. Sexually transmitted infections (STIs)  Get screened for STIs, including gonorrhea and chlamydia, if: ? You are sexually active and are younger than 70 years of age. ? You are older than 70 years of age and your health care provider tells you that you are at risk for this type of infection. ? Your sexual activity has changed since you were last screened, and you are at increased risk for chlamydia or gonorrhea. Ask your health care provider if   you are at risk.  Ask your health care provider about whether you are at high risk for HIV. Your health care provider may recommend a prescription medicine to help prevent HIV infection. If you choose to take medicine to prevent HIV, you should first get tested for HIV. You should then be tested every 3 months for as long as you are taking the medicine. Pregnancy  If you are about to stop having your period (premenopausal) and  you may become pregnant, seek counseling before you get pregnant.  Take 400 to 800 micrograms (mcg) of folic acid every day if you become pregnant.  Ask for birth control (contraception) if you want to prevent pregnancy. Osteoporosis and menopause Osteoporosis is a disease in which the bones lose minerals and strength with aging. This can result in bone fractures. If you are 65 years old or older, or if you are at risk for osteoporosis and fractures, ask your health care provider if you should:  Be screened for bone loss.  Take a calcium or vitamin D supplement to lower your risk of fractures.  Be given hormone replacement therapy (HRT) to treat symptoms of menopause. Follow these instructions at home: Lifestyle  Do not use any products that contain nicotine or tobacco, such as cigarettes, e-cigarettes, and chewing tobacco. If you need help quitting, ask your health care provider.  Do not use street drugs.  Do not share needles.  Ask your health care provider for help if you need support or information about quitting drugs. Alcohol use  Do not drink alcohol if: ? Your health care provider tells you not to drink. ? You are pregnant, may be pregnant, or are planning to become pregnant.  If you drink alcohol: ? Limit how much you use to 0-1 drink a day. ? Limit intake if you are breastfeeding.  Be aware of how much alcohol is in your drink. In the [place] one drink equals one 12 oz bottle of beer (355 mL), one 5 oz glass of wine (148 mL), or one 1 oz glass of hard liquor (44 mL). General instructions  Schedule regular health, dental, and eye exams.  Stay current with your vaccines.  Tell your health care provider if: ? You often feel depressed. ? You have ever been abused or do not feel safe at home. Summary  Adopting a healthy lifestyle and getting preventive care are important in promoting health and wellness.  Follow your health care provider's instructions about healthy  diet, exercising, and getting tested or screened for diseases.  Follow your health care provider's instructions on monitoring your cholesterol and blood pressure. This information is not intended to replace advice given to you by your health care provider. Make sure you discuss any questions you have with your health care provider. Document Revised: 11/26/2018 Document Reviewed: 11/26/2018 Elsevier Patient Education  2020 [employer]vier Inc[employer]  

## 2020-01-14 ENCOUNTER — Encounter: Payer: Self-pay | Admitting: Internal Medicine

## 2020-01-14 DIAGNOSIS — N1832 Chronic kidney disease, stage 3b: Secondary | ICD-10-CM | POA: Insufficient documentation

## 2020-01-14 LAB — HM DEXA SCAN: HM Dexa Scan: -2.5

## 2020-01-14 MED ORDER — CETIRIZINE HCL 10 MG PO TABS: 10.0000 mg | ORAL_TABLET | Freq: Two times a day (BID) | ORAL | 1 refills | Status: DC

## 2020-01-14 MED ORDER — SIMVASTATIN 10 MG PO TABS: 10.0000 mg | ORAL_TABLET | Freq: Every day | ORAL | 1 refills | Status: DC

## 2020-01-14 MED ORDER — TRIAMCINOLONE ACETONIDE 0.1 % EX OINT: TOPICAL_OINTMENT | Freq: Two times a day (BID) | CUTANEOUS | 2 refills | Status: DC

## 2020-01-14 MED ORDER — SERTRALINE HCL 100 MG PO TABS: 100.0000 mg | ORAL_TABLET | Freq: Every day | ORAL | 1 refills | Status: DC
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
## 2020-01-19 DIAGNOSIS — Q213 Tetralogy of Fallot: Secondary | ICD-10-CM | POA: Diagnosis not present

## 2020-01-19 DIAGNOSIS — Z952 Presence of prosthetic heart valve: Secondary | ICD-10-CM | POA: Diagnosis not present

## 2020-01-19 DIAGNOSIS — I10 Essential (primary) hypertension: Secondary | ICD-10-CM | POA: Diagnosis not present

## 2020-01-20 ENCOUNTER — Inpatient Hospital Stay: Admission: RE | Admit: 2020-01-20 | Payer: Medicare HMO | Source: Ambulatory Visit

## 2020-03-08 ENCOUNTER — Telehealth: Payer: Self-pay | Admitting: Internal Medicine

## 2020-03-08 DIAGNOSIS — F320 Major depressive disorder, single episode, mild: Secondary | ICD-10-CM

## 2020-03-08 NOTE — Progress Notes (Signed)
  Chronic Care Management   Note  03/08/2020 Name: VONA RAFANAN MRN: [id number] DOB: 04/08/1951  [name] is a 69 y.o. year old female who is a primary care patient of [name], MD. I reached out to [name] by phone today in response to a referral sent by Ms. Tish Men Umanzor's PCP, [name], MD.   Ms. Krings was given information about Chronic Care Management services today including:  1. CCM service includes personalized support from designated clinical staff supervised by her physician, including individualized plan of care and coordination with other care providers 2. 24/7 contact phone numbers for assistance for urgent and routine care needs. 3. Service will only be billed when office clinical staff spend 20 minutes or more in a month to coordinate care. 4. Only one practitioner may furnish and bill the service in a calendar month. 5. The patient may stop CCM services at any time (effective at the end of the month) by phone call to the office staff.   Patient agreed to services and verbal consent obtained.   Follow up plan:   Raynicia Dukes [employer] Scheduler

## 2020-03-11 ENCOUNTER — Ambulatory Visit: Payer: Medicare HMO

## 2020-03-11 ENCOUNTER — Other Ambulatory Visit: Payer: Self-pay | Admitting: Internal Medicine

## 2020-03-11 DIAGNOSIS — E039 Hypothyroidism, unspecified: Secondary | ICD-10-CM

## 2020-03-28 NOTE — Addendum Note (Signed)
Addended by: Aviva Signs M on: 03/28/2020 10:17 AM   Modules accepted: Orders

## 2020-03-29 ENCOUNTER — Telehealth: Payer: Medicare HMO
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
## 2020-04-22 ENCOUNTER — Ambulatory Visit (INDEPENDENT_AMBULATORY_CARE_PROVIDER_SITE_OTHER): Payer: Medicare HMO | Admitting: Family

## 2020-04-22 ENCOUNTER — Encounter: Payer: Self-pay | Admitting: Family

## 2020-04-22 ENCOUNTER — Other Ambulatory Visit: Payer: Self-pay

## 2020-04-22 VITALS — BP 128/78 | HR 69 | Temp 98.0°F | Ht 61.0 in | Wt 106.8 lb

## 2020-04-22 DIAGNOSIS — Z1231 Encounter for screening mammogram for malignant neoplasm of breast: Secondary | ICD-10-CM

## 2020-04-22 DIAGNOSIS — R0781 Pleurodynia: Secondary | ICD-10-CM
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
## 2020-04-27 ENCOUNTER — Telehealth: Payer: Self-pay | Admitting: Internal Medicine

## 2020-04-27 NOTE — Chronic Care Management (AMB) (Signed)
  Chronic Care Management   Note  04/27/2020 Name: Michelle [name] MRN: [id number] DOB: August 07, 1951  Michelle [name] is a 69 y.o. year old female who is a primary care patient of [name], MD. I reached out to Michelle [name] by phone today in response to a referral sent by Ms. Tish Men Kramlich's PCP, [name], MD.   Ms. Bullin was given information about Chronic Care Management services today including:  1. CCM service includes personalized support from designated clinical staff supervised by her physician, including individualized plan of care and coordination with other care providers 2. 24/7 contact phone numbers for assistance for urgent and routine care needs. 3. Service will only be billed when office clinical staff spend 20 minutes or more in a month to coordinate care. 4. Only one practitioner may furnish and bill the service in a calendar month. 5. The patient may stop CCM services at any time (effective at the end of the month) by phone call to the office staff.   Patient agreed to services and verbal consent obtained.   Follow up plan:   [place]

## 2020-05-05 ENCOUNTER — Ambulatory Visit: Payer: Medicare HMO

## 2020-05-23 ENCOUNTER — Ambulatory Visit: Payer: Medicare HMO | Admitting: Neurology

## 2020-06-03 ENCOUNTER — Telehealth: Payer: Self-pay | Admitting: Internal Medicine

## 2020-06-03 NOTE — Telephone Encounter (Signed)
New message:   Pt is calling and states she has had diarrhea for the last couple times she has looked at it it is turning black. Please advise.

## 2020-06-03 NOTE — Telephone Encounter (Signed)
Pt contacted and has been scheduled for an appointment on Monday 1:40pm. Pt stated that she did not have a pen with her. I called patient and lvm with appointment details.

## 2020-06-06 ENCOUNTER — Ambulatory Visit: Payer: Medicare HMO | Admitting: Internal Medicine

## 2020-06-10 NOTE — Chronic Care Management (AMB) (Deleted)
Chronic Care Management Pharmacy  Name: Michelle [name]  MRN: [id number] DOB: 02-01-1951   Chief Complaint/ HPI  Michelle [name],  69 y.o. , female presents for their Initial CCM visit with the clinical pharmacist via telephone due to COVID-19 Pandemic.  PCP : [name], MD  Their chronic conditions include: Hypertension, Hyperlipidemia, Chronic Kidney Disease, Hypothyroidism, Anxiety, Osteoporosis and Tetralogy of Fallot s/p repair   Office Visits: 04/22/20: Patient presented for Jodi Mourning, FNP for rib pain. Mammogram ordered, no medication changes made.  01/13/20: Patient presented to Dr. Ronnald Ramp for follow-up. Betamethsone (pt not using) and torsemide (pt ran out, bp controlled) discontinued.  Sertraline 100 mg restarted for anxiety.   Consult Visit: 01/19/20: Patient presented to Kathlee Nations, NP for congenital heart disease follow-up. Patient without SOB or chest pain. No medication changes made. 01/12/20: Patient Referred to Dr. Henrene Pastor (GI) for rectal bleeding. Metamucil started, patient instructed to increase fiber intake and water consumption. Colonoscopy ordered. Linaclotide stopped (error)  12/15/20: Patient underwent bladder cystoscopy  Allergies  Allergen Reactions  . Aricept [Donepezil] Other (See Comments)    bradycardia  . Penicillins Rash    Has patient had a PCN reaction causing immediate rash, facial/tongue/throat swelling, SOB or lightheadedness with hypotension: Yes Has patient had a PCN reaction causing severe rash involving mucus membranes or skin necrosis: No Has patient had a PCN reaction that required hospitalization: No Has patient had a PCN reaction occurring within the last 10 years: No If all of the above answers are "NO", then may proceed with Cephalosporin use.     Medications: Outpatient Encounter Medications as of 06/13/2020  Medication Sig  . amitriptyline (ELAVIL) 50 MG tablet Take 50 mg by mouth at bedtime as needed for sleep.  Marland Kitchen aspirin  81 MG chewable tablet Chew 81 mg by mouth daily.  . cetirizine (ZYRTEC) 10 MG tablet Take 1 tablet (10 mg total) by mouth 2 (two) times daily.  . cholecalciferol (VITAMIN D) 1000 UNITS tablet Take 1,000 Units by mouth daily.   Marland Kitchen donepezil (ARICEPT) 10 MG tablet   . levothyroxine (SYNTHROID) 50 MCG tablet TAKE 1 TABLET BY MOUTH EVERY DAY  . Omega-3 Fatty Acids (FISH OIL) 1000 MG CAPS Take 2 capsules by mouth daily.   . sertraline (ZOLOFT) 100 MG tablet Take 1 tablet (100 mg total) by mouth daily.  . simvastatin (ZOCOR) 10 MG tablet Take 1 tablet (10 mg total) by mouth daily.  Marland Kitchen torsemide (DEMADEX) 10 MG tablet Take by mouth.  . traMADol (ULTRAM) 50 MG tablet Take 1-2 tablets (50-100 mg total) by mouth every 6 (six) hours as needed for moderate pain. Post-operatively.  . triamcinolone ointment (KENALOG) 0.1 % Apply topically 2 (two) times daily.  . [DISCONTINUED] levothyroxine (SYNTHROID) 50 MCG tablet Take 1 tablet (50 mcg total) by mouth daily.   No facility-administered encounter medications on file as of 06/13/2020.     Current Diagnosis/Assessment:    Goals Addressed   None    Hypertension   BP goal is:  {CHL HP UPSTREAM Pharmacist BP ranges:[phone]}  Office blood pressures are  BP Readings from Last 3 Encounters:  04/22/20 128/78  01/13/20 130/80  01/12/20 126/78   Patient checks BP at home {CHL HP BP Monitoring Frequency:[phone]} Patient home BP readings are ranging: ***  Patient has failed these meds in the past: *** Patient is currently {CHL Controlled/Uncontrolled:[phone]} on the following medications:  . Torsemide 10 mg (discontinued by provider 1/25)   We discussed {CHL  HP Upstream Pharmacy discussion:9372973378}  Plan  Continue {CHL HP Upstream Pharmacy Plans:[phone]}   Hyperlipidemia   LDL goal < ***  Lipid Panel     Component Value Date/Time   CHOL 157 01/13/2020 1344   TRIG 139.0 01/13/2020 1344   HDL 44.20 01/13/2020 1344   LDLCALC  85 01/13/2020 1344   LDLDIRECT 113 (H) 06/16/2008 2036    Hepatic Function Latest Ref Rng & Units 01/13/2020 01/28/2018 08/08/2017  Total Protein 6.0 - 8.3 g/dL 7.0 6.6 4.9(L)  Albumin 3.5 - 5.2 g/dL 4.3 4.1 2.6(L)  AST 0 - 37 U/L _0 ALT 0 - 35 U/L 7 7 11(L)  Alk Phosphatase 39 - 117 U/L 55 48 38  Total Bilirubin 0.2 - 1.2 mg/dL 0.5 0.4 0.6  Bilirubin, Direct 0.0 - 0.3 mg/dL 0.1 - -     The 10-year ASCVD risk score Mikey Bussing DC Jr., et al., 2013) is: 12.2%   Values used to calculate the score:     Age: 49 years     Sex: Female     Is Non-Hispanic African American: No     Diabetic: No     Tobacco smoker: No     Systolic Blood Pressure: 401 mmHg     Is BP treated: Yes     HDL Cholesterol: 44.2 mg/dL     Total Cholesterol: 157 mg/dL   Patient has failed these meds in past: *** Patient is currently {CHL Controlled/Uncontrolled:[phone]} on the following medications:  . Aspirin 81 mg daily  . Omega-3 Fatty Acid 1000 mg 2 caps daily  . Simvastatin 10 mg daily   We discussed:  {CHL HP Upstream Pharmacy discussion:9372973378}  Plan  Continue {CHL HP Upstream Pharmacy Plans:[phone]}  Osteoporosis   Last DEXA Scan: 01/14/20   T-Score femoral neck: -2.8  T-Score total hip: -2.7  T-Score lumbar spine: -2.5  T-Score forearm radius: n/a  10-year probability of major osteoporotic fracture: n/a  10-year probability of hip fracture: n/a  No results found for: VD25OH   Patient {is;is not an osteoporosis candidate:23886}  Patient has failed these meds in past: *** Patient is currently {CHL Controlled/Uncontrolled:[phone]} on the following medications:  Marland Kitchen Vitamin D 1000 units daily   We discussed:  {Osteoporosis Counseling:23892}  Plan  Continue {CHL HP Upstream Pharmacy Plans:[phone]}  Hypothyroidism   Lab Results  Component Value Date/Time   TSH 0.36 01/13/2020 01:44 PM   TSH 4.20 12/02/2019 02:24 PM    Patient has failed these meds in past: *** Patient  is currently {CHL Controlled/Uncontrolled:[phone]} on the following medications:  . Levothyroxine 50 mcg daily   We discussed:  {CHL HP Upstream Pharmacy discussion:9372973378}  Plan  Continue {CHL HP Upstream Pharmacy Plans:[phone]}  Depression / Anxiety   PHQ9 Score:  PHQ9 SCORE ONLY 12/02/2019 07/29/2019 07/28/2018  PHQ-9 Total Score _1 GAD7 Score: GAD 7 : Generalized Anxiety Score 12/02/2019 10/29/2017  Nervous, Anxious, on Edge 1 3  Control/stop worrying 0 1  Worry too much - different things 0 2  Trouble relaxing 0 0  Restless 1 1  Easily annoyed or irritable 0 2  Afraid - awful might happen 0 0  Total GAD 7 Score 2 9  Anxiety Difficulty Not difficult at all -    Patient has failed these meds in past: *** Patient is currently {CHL Controlled/Uncontrolled:[phone]} on the following medications:  . Amitriptyline 50 mg QHS PRN  . Sertraline 100 mg daily   We discussed:  ***  Plan  Continue {CHL HP Upstream Pharmacy Plans:9202913335}  Misc / OTC    . Cetirizine 10 mg BID  . Donepezil 10 mg daily . Tramadol 50 mg 1-2 tabs q6hr PRN  . Triamcinolone 0.1% ointment BID   We discussed:  ***  Plan  Continue {CHL HP Upstream Pharmacy YKZLD:[id number]}  Vaccines   Reviewed and discussed patient's vaccination history.    Immunization History  Administered Date(s) Administered  . Fluad Quad(high Dose 65+) 08/11/2019  . Influenza Split 12/02/2012  . Influenza Whole 10/21/2007, 11/08/2010  . Influenza, High Dose Seasonal PF 10/11/2015, 08/16/2016, 08/14/2017, 01/28/2019  . Influenza,inj,Quad PF,6+ Mos 09/06/2014  . Pneumococcal Conjugate-13 07/30/2016  . Pneumococcal Polysaccharide-23 04/19/2015, 11/07/2016  . Td 04/16/2005  . Tdap 08/24/2015    Plan  Recommended patient receive *** vaccine in *** office.   Medication Management   [facility] Uses pill box? {Yes or If no, why not?:20788} Pt endorses ***%  compliance  We discussed: ***  Plan  {US Pharmacy QZES:92330}    Follow up: *** month phone visit  ***

## 2020-06-13 ENCOUNTER — Other Ambulatory Visit: Payer: Self-pay

## 2020-06-13 ENCOUNTER — Ambulatory Visit: Payer: Medicare HMO | Admitting: Pharmacist

## 2020-06-13 ENCOUNTER — Telehealth: Payer: Medicare HMO

## 2020-06-13 DIAGNOSIS — E785 Hyperlipidemia, unspecified: Secondary | ICD-10-CM

## 2020-06-13 DIAGNOSIS — I10 Essential (primary) hypertension: Secondary | ICD-10-CM

## 2020-06-13 DIAGNOSIS — M810 Age-related osteoporosis without current pathological fracture: Secondary | ICD-10-CM

## 2020-06-13 NOTE — Patient Instructions (Addendum)
Visit Information  Phone number for Pharmacist: [phone]  Thank you for meeting with me to discuss your medications! I look forward to working with you to achieve your health care goals. Below is a summary of what we talked about during the visit:  Goals Addressed            This Visit's Progress   . Pharmacy Care Plan       CARE PLAN ENTRY (see longitudinal plan of care for additional care plan information)  Current Barriers:  . Chronic Disease Management support, education, and care coordination needs related to Hypertension, Hyperlipidemia, and Osteoporosis   Hypertension BP Readings from Last 3 Encounters:  04/22/20 128/78  01/13/20 130/80  01/12/20 126/78 .  Pharmacist Clinical Goal(s): o Over the next 120 days, patient will work with PharmD and providers to maintain BP goal <130/80 . Current regimen:  o No medications . Interventions: o Discussed BP goals and benefits of checking BP at home occasionally  . Patient self care activities - Over the next 120 days, patient will: o Check BP occasionally, document, and provide at future appointments o Ensure daily salt intake < 2300 mg/day  Hyperlipidemia / Cardiovascular risk reduction Lab Results  Component Value Date/Time   LDLCALC 85 01/13/2020 01:44 PM   LDLDIRECT 113 (H) 06/16/2008 08:36 PM .  Pharmacist Clinical Goal(s): o Over the next 120 days, patient will work with PharmD and providers to maintain LDL goal < 100 . Current regimen:  o Simvastatin 10 mg daily  o Aspirin 81 mg daily  o Omega-3 Fatty Acid 1000 mg 2 caps daily . Interventions: o Discussed cholesterol goals and benefits of medication for prevention of heart attack / stroke . Patient self care activities - Over the next 120 days, patient will: o Continue medication as prescribed o Continue low cholesterol diet  Osteoporosis . Pharmacist Clinical Goal(s) o Over the next 120 days, patient will work with PharmD and providers to optimize  therapy . Current regimen:  o No medications . Interventions: o Recommend calcium 1200 mg/day and Vitamin D 1000 units/day from diet and/or supplement . Patient self care activities - Over the next 120 days, patient will: o Start calcium-Vitamin D supplement for bone health  Medication management . Pharmacist Clinical Goal(s): o Over the next 120 days, patient will work with PharmD and providers to maintain optimal medication adherence . [facility] . Interventions o Comprehensive medication review performed. o Continue current medication management strategy . Patient self care activities - Over the next 120 days, patient will: o Focus on medication adherence by pill box o Take medications as prescribed o Report any questions or concerns to PharmD and/or provider(s)  Initial goal documentation      Michelle [name] was given information about Chronic Care Management services today including:  1. CCM service includes personalized support from designated clinical staff supervised by her physician, including individualized plan of care and coordination with other care providers 2. 24/7 contact phone numbers for assistance for urgent and routine care needs. 3. Standard insurance, coinsurance, copays and deductibles apply for chronic care management only during months in which we provide at least 20 minutes of these services. Most insurances cover these services at 100%, however patients may be responsible for any copay, coinsurance and/or deductible if applicable. This service may help you avoid the need for more expensive face-to-face services. 4. Only one practitioner may furnish and bill the service in a calendar month. 5. The patient may stop CCM  services at any time (effective at the end of the month) by phone call to the office staff.  Patient agreed to services and verbal consent obtained.   The patient verbalized understanding of instructions provided today and agreed to  receive a mailed copy of patient instruction and/or educational materials. Telephone follow up appointment with pharmacy team member scheduled for: 4 months  Charlene Brooke, PharmD Clinical Pharmacist [facility] [place] [place] protect organs, store calcium, anchor muscles, and support the whole body. Keeping your bones strong is important, especially as you get older. You can take actions to help keep your bones strong and healthy. Why is keeping my bones healthy important?  Keeping your bones healthy is important because your body constantly replaces bone cells. Cells get old, and new cells take their place. As we age, we lose bone cells because the body may not be able to make enough new cells to replace the old cells. The amount of bone cells and bone tissue you have is referred to as bone mass. The higher your bone mass, the stronger your bones. The aging process leads to an overall loss of bone mass in the body, which can increase the likelihood of:  Joint pain and stiffness.  Broken bones.  A condition in which the bones become weak and brittle (osteoporosis). A large decline in bone mass occurs in older adults. In women, it occurs about the time of menopause. What actions can I take to keep my bones healthy? Good health habits are important for maintaining healthy bones. This includes eating nutritious foods and exercising regularly. To have healthy bones, you need to get enough of the right minerals and vitamins. Most nutrition experts recommend getting these nutrients from the foods that you eat. In some cases, taking supplements may also be recommended. Doing certain types of exercise is also important for bone health. What are the nutritional recommendations for healthy bones?  Eating a well-balanced diet with plenty of calcium and vitamin D will help to protect your bones. Nutritional recommendations vary from person to person. Ask  your health care provider what is healthy for you. Here are some general guidelines. Get enough calcium Calcium is the most important (essential) mineral for bone health. Most people can get enough calcium from their diet, but supplements may be recommended for people who are at risk for osteoporosis. Good sources of calcium include:  Dairy products, such as low-fat or nonfat milk, cheese, and yogurt.  Dark green leafy vegetables, such as bok choy and broccoli.  Calcium-fortified foods, such as orange juice, cereal, bread, soy beverages, and tofu products.  Nuts, such as almonds. Follow these recommended amounts for daily calcium intake:  Children, age 77-3: 700 mg.  Children, age 68-8: 1,000 mg.  Children, age 66-13: 1,300 mg.  Teens, age 65-18: 1,300 mg.  Adults, age 94-50: 1,000 mg.  Adults, age 774-70: ? Men: 1,000 mg. ? Women: 1,200 mg.  Adults, age 778 or older: 1,200 mg.  Pregnant and breastfeeding females: ? Teens: 1,300 mg. ? Adults: 1,000 mg. Get enough vitamin D Vitamin D is the most essential vitamin for bone health. It helps the body absorb calcium. Sunlight stimulates the skin to make vitamin D, so be sure to get enough sunlight. If you live in a cold climate or you do not get outside often, your health care provider may recommend that you take vitamin D supplements. Good sources of vitamin D in your diet include:  Egg yolks.  Saltwater fish.  Milk and cereal fortified with vitamin D. Follow these recommended amounts for daily vitamin D intake:  Children and teens, age 50-18: 600 international units.  Adults, age 45 or younger: 400-800 international units.  Adults, age 38 or older: 800-1,000 international units. Get other important nutrients Other nutrients that are important for bone health include:  Phosphorus. This mineral is found in meat, poultry, dairy foods, nuts, and legumes. The recommended daily intake for adult men and adult women is 700  mg.  Magnesium. This mineral is found in seeds, nuts, dark green vegetables, and legumes. The recommended daily intake for adult men is 400-420 mg. For adult women, it is 310-320 mg.  Vitamin K. This vitamin is found in green leafy vegetables. The recommended daily intake is 120 mg for adult men and 90 mg for adult women. What type of physical activity is best for building and maintaining healthy bones? Weight-bearing and strength-building activities are important for building and maintaining healthy bones. Weight-bearing activities cause muscles and bones to work against gravity. Strength-building activities increase the strength of the muscles that support bones. Weight-bearing and muscle-building activities include:  Walking and hiking.  Jogging and running.  Dancing.  Gym exercises.  Lifting weights.  Tennis and racquetball.  Climbing stairs.  Aerobics. Adults should get at least 30 minutes of moderate physical activity on most days. Children should get at least 60 minutes of moderate physical activity on most days. Ask your health care provider what type of exercise is best for you. How can I find out if my bone mass is low? Bone mass can be measured with an X-ray test called a bone mineral density (BMD) test. This test is recommended for all women who are age 36 or older. It may also be recommended for:  Men who are age 84 or older.  People who are at risk for osteoporosis because of: ? Having bones that break easily. ? Having a long-term disease that weakens bones, such as kidney disease or rheumatoid arthritis. ? Having menopause earlier than normal. ? Taking medicine that weakens bones, such as steroids, thyroid hormones, or hormone treatment for breast cancer or prostate cancer. ? Smoking. ? Drinking three or more alcoholic drinks a day. If you find that you have a low bone mass, you may be able to prevent osteoporosis or further bone loss by changing your diet and  lifestyle. Where can I find more information? For more information, check out the following websites:  [place]: AviationTales.fr  [employer] of Health: www.bones.SouthExposed.es  International Osteoporosis Foundation: Administrator.iofbonehealth.org Summary  The aging process leads to an overall loss of bone mass in the body, which can increase the likelihood of broken bones and osteoporosis.  Eating a well-balanced diet with plenty of calcium and vitamin D will help to protect your bones.  Weight-bearing and strength-building activities are also important for building and maintaining strong bones.  Bone mass can be measured with an X-ray test called a bone mineral density (BMD) test. This information is not intended to replace advice given to you by your health care provider. Make sure you discuss any questions you have with your health care provider. Document Revised: 12/30/2017 Document Reviewed: 12/30/2017 Elsevier Patient Education  2020 [employer].

## 2020-06-13 NOTE — Chronic Care Management (AMB) (Signed)
Chronic Care Management Pharmacy  Name: Michelle [name]  MRN: [id number] DOB: June 13, 1951   Chief Complaint/ HPI  Michelle [name],  69 y.o. , female presents for their Initial CCM visit with the clinical pharmacist via telephone due to COVID-19 Pandemic.  PCP : [name], MD  Their chronic conditions include: Hypertension, Hyperlipidemia, Chronic Kidney Disease, Hypothyroidism, Anxiety, Osteoporosis and Tetralogy of Fallot s/p repair   Pt reports she is divorced, she used to live in [place], now in a secluded house 10 miles outside [place].  Office Visits: 04/22/20: Patient presented for Jodi Mourning, FNP for rib pain. Mammogram ordered, no medication changes made.  01/13/20: Patient presented to Dr. Ronnald Ramp for follow-up. Betamethsone (pt not using) and torsemide (pt ran out, bp controlled) discontinued.  Sertraline 100 mg restarted for anxiety.   Consult Visit: 01/19/20: Patient presented to Kathlee Nations, NP for congenital heart disease follow-up. Patient without SOB or chest pain. No medication changes made. 01/12/20: Patient Referred to Dr. Henrene Pastor (GI) for rectal bleeding. Metamucil started, patient instructed to increase fiber intake and water consumption. Colonoscopy ordered. Linaclotide stopped (error)  12/15/20: Patient underwent bladder cystoscopy  Allergies  Allergen Reactions  . Aricept [Donepezil] Other (See Comments)    bradycardia  . Penicillins Rash    Has patient had a PCN reaction causing immediate rash, facial/tongue/throat swelling, SOB or lightheadedness with hypotension: Yes Has patient had a PCN reaction causing severe rash involving mucus membranes or skin necrosis: No Has patient had a PCN reaction that required hospitalization: No Has patient had a PCN reaction occurring within the last 10 years: No If all of the above answers are "NO", then may proceed with Cephalosporin use.     Medications: Outpatient Encounter Medications as of 06/13/2020   Medication Sig  . amitriptyline (ELAVIL) 50 MG tablet Take 50 mg by mouth at bedtime as needed for sleep.  Marland Kitchen donepezil (ARICEPT) 10 MG tablet   . levothyroxine (SYNTHROID) 50 MCG tablet TAKE 1 TABLET BY MOUTH EVERY DAY  . sertraline (ZOLOFT) 100 MG tablet Take 1 tablet (100 mg total) by mouth daily.  . simvastatin (ZOCOR) 10 MG tablet Take 1 tablet (10 mg total) by mouth daily.  Marland Kitchen triamcinolone ointment (KENALOG) 0.1 % Apply topically 2 (two) times daily.  Marland Kitchen aspirin 81 MG chewable tablet Chew 81 mg by mouth daily. (Patient not taking: Reported on 06/13/2020)  . cetirizine (ZYRTEC) 10 MG tablet Take 1 tablet (10 mg total) by mouth 2 (two) times daily. (Patient not taking: Reported on 06/13/2020)  . cholecalciferol (VITAMIN D) 1000 UNITS tablet Take 1,000 Units by mouth daily.  (Patient not taking: Reported on 06/13/2020)  . Omega-3 Fatty Acids (FISH OIL) 1000 MG CAPS Take 2 capsules by mouth daily.  (Patient not taking: Reported on 06/13/2020)  . torsemide (DEMADEX) 10 MG tablet Take by mouth. (Patient not taking: Reported on 06/13/2020)  . traMADol (ULTRAM) 50 MG tablet Take 1-2 tablets (50-100 mg total) by mouth every 6 (six) hours as needed for moderate pain. Post-operatively. (Patient not taking: Reported on 06/13/2020)  . [DISCONTINUED] levothyroxine (SYNTHROID) 50 MCG tablet Take 1 tablet (50 mcg total) by mouth daily.   No facility-administered encounter medications on file as of 06/13/2020.     Current Diagnosis/Assessment:  SDOH Interventions     Most Recent Value  SDOH Interventions  Transportation Interventions [id number] Referral      Goals Addressed            This Visit's Progress   .  Pharmacy Care Plan       CARE PLAN ENTRY (see longitudinal plan of care for additional care plan information)  Current Barriers:  . Chronic Disease Management support, education, and care coordination needs related to Hypertension, Hyperlipidemia, and Osteoporosis   Hypertension BP  Readings from Last 3 Encounters:  04/22/20 128/78  01/13/20 130/80  01/12/20 126/78 .  Pharmacist Clinical Goal(s): o Over the next 120 days, patient will work with PharmD and providers to maintain BP goal <130/80 . Current regimen:  o No medications . Interventions: o Discussed BP goals and benefits of checking BP at home occasionally  . Patient self care activities - Over the next 120 days, patient will: o Check BP occasionally, document, and provide at future appointments o Ensure daily salt intake < 2300 mg/day  Hyperlipidemia / Cardiovascular risk reduction Lab Results  Component Value Date/Time   LDLCALC 85 01/13/2020 01:44 PM   LDLDIRECT 113 (H) 06/16/2008 08:36 PM .  Pharmacist Clinical Goal(s): o Over the next 120 days, patient will work with PharmD and providers to maintain LDL goal < 100 . Current regimen:  o Simvastatin 10 mg daily  o Aspirin 81 mg daily  o Omega-3 Fatty Acid 1000 mg 2 caps daily . Interventions: o Discussed cholesterol goals and benefits of medication for prevention of heart attack / stroke . Patient self care activities - Over the next 120 days, patient will: o Continue medication as prescribed o Continue low cholesterol diet  Osteoporosis . Pharmacist Clinical Goal(s) o Over the next 120 days, patient will work with PharmD and providers to optimize therapy . Current regimen:  o No medications . Interventions: o Recommend calcium 1200 mg/day and Vitamin D 1000 units/day from diet and/or supplement . Patient self care activities - Over the next 120 days, patient will: o Start calcium-Vitamin D supplement for bone health  Medication management . Pharmacist Clinical Goal(s): o Over the next 120 days, patient will work with PharmD and providers to maintain optimal medication adherence . [facility] . Interventions o Comprehensive medication review performed. o Continue current medication management strategy . Patient self care  activities - Over the next 120 days, patient will: o Focus on medication adherence by pill box o Take medications as prescribed o Report any questions or concerns to PharmD and/or provider(s)  Initial goal documentation      Hypertension   BP goal is:  <130/80  Office blood pressures are  BP Readings from Last 3 Encounters:  04/22/20 128/78  01/13/20 130/80  01/12/20 126/78   Patient checks BP at home infrequently Patient home BP readings are ranging: 120/70  Patient has failed these meds in the past: torsemide Patient is currently controlled on the following medications:  . No medications   We discussed diet and exercise extensively  Plan  Continue control with diet and exercise    Hyperlipidemia   LDL goal < 100  Lipid Panel     Component Value Date/Time   CHOL 157 01/13/2020 1344   TRIG 139.0 01/13/2020 1344   HDL 44.20 01/13/2020 1344   LDLCALC 85 01/13/2020 1344   LDLDIRECT 113 (H) 06/16/2008 2036    Hepatic Function Latest Ref Rng & Units 01/13/2020 01/28/2018 08/08/2017  Total Protein 6.0 - 8.3 g/dL 7.0 6.6 4.9(L)  Albumin 3.5 - 5.2 g/dL 4.3 4.1 2.6(L)  AST 0 - 37 U/L '19 19 16  ' ALT 0 - 35 U/L 7 7 11(L)  Alk Phosphatase 39 - 117 U/L 55 48  38  Total Bilirubin 0.2 - 1.2 mg/dL 0.5 0.4 0.6  Bilirubin, Direct 0.0 - 0.3 mg/dL 0.1 - -     The 10-year ASCVD risk score Mikey Bussing DC Jr., et al., 2013) is: 12.2%   Values used to calculate the score:     Age: 68 years     Sex: Female     Is Non-Hispanic African American: No     Diabetic: No     Tobacco smoker: No     Systolic Blood Pressure: 121 mmHg     Is BP treated: Yes     HDL Cholesterol: 44.2 mg/dL     Total Cholesterol: 157 mg/dL   Patient has failed these meds in past: n/a Patient is currently controlled on the following medications:  . Aspirin 81 mg daily  . Omega-3 Fatty Acid 1000 mg 2 caps daily  . Simvastatin 10 mg daily - afternoon  We discussed:  diet and exercise extensively; cholesterol  goals and benefits of statin for ASCVD prevention  Plan  Continue current medications  Osteoporosis   Last DEXA Scan: 01/14/20   T-Score femoral neck: -2.8  T-Score total hip: -2.7  T-Score lumbar spine: -2.5  T-Score forearm radius: n/a  10-year probability of major osteoporotic fracture: n/a  10-year probability of hip fracture: n/a  No results found for: VD25OH   Patient is a candidate for pharmacologic treatment due to T-Score < -2.5 in femoral neck, T-Score < -2.5 in total hip  and T-Score < -2.5 in lumbar spine  Patient has failed these meds in past: alendronate (09/2016 - 07/2017) Patient is currently controlled on the following medications:  Marland Kitchen Vitamin D 1000 units daily -not taking  We discussed:  Recommend [phone] units of vitamin D daily. Recommend 1200 mg of calcium daily from dietary and supplemental sources.; pt has been offered Prolia in the past but cost was too high for patient.  Plan  Start calcium-Vitamin D supplement  Hypothyroidism   Lab Results  Component Value Date/Time   TSH 0.36 01/13/2020 01:44 PM   TSH 4.20 12/02/2019 02:24 PM   Patient has failed these meds in past: n/a  Patient is currently controlled on the following medications:  . Levothyroxine 50 mcg daily   We discussed: P takes all of her meds together in the afternoon. Since TSH has been controlled in the past and even trending toward hyperthyroid at last check, pt does not need to separate from other meds to improve efficacy.  Plan  Continue current medications  Depression / Anxiety   Depression screen [facility] 2/9 12/02/2019 07/29/2019 07/28/2018  Decreased Interest 0 0 0  Down, Depressed, Hopeless 1 1 0  PHQ - 2 Score 1 1 0  Altered sleeping 0 1 1  Tired, decreased energy '1 1 1  ' Change in appetite 0 0 2  Feeling bad or failure about yourself  0 0 0  Trouble concentrating 1 0 0  Moving slowly or fidgety/restless 0 0 0  Suicidal thoughts 0 0 0  PHQ-9 Score '3 3 4  ' Difficult doing  work/chores Not difficult at all Not difficult at all Not difficult at all  Some recent data might be hidden   GAD7 Score: GAD 7 : Generalized Anxiety Score 12/02/2019 10/29/2017  Nervous, Anxious, on Edge 1 3  Control/stop worrying 0 1  Worry too much - different things 0 2  Trouble relaxing 0 0  Restless 1 1  Easily annoyed or irritable 0 2  Afraid - awful might  happen 0 0  Total GAD 7 Score 2 9  Anxiety Difficulty Not difficult at all -   Patient has failed these meds in past: n/a Patient is currently controlled on the following medications:  . Amitriptyline 50 mg QHS PRN  . Sertraline 100 mg daily   We discussed:  Pt reports adherence with medications, denies issues currently  Plan  Continue current medications  Cognitive impairment   Patient has failed these meds in past: n/a Patient is currently controlled on the following medications:  . Donepezil 10 mg daily  We discussed:  Indication for and benefits of medication; pt denies issues  Plan  Continue current medications  Misc / OTC    . Cetirizine 10 mg BID  . Triamcinolone 0.1% ointment BID  . Multivitamin . Ibuprofen 200 mg  We discussed:  Patient is satisfied with current OTC regimen and denies issues. She does have limited income and and cannot afford to spend a lot on OTC items.    Plan  Continue current medications  Vaccines   Reviewed and discussed patient's vaccination history.    Immunization History  Administered Date(s) Administered  . Fluad Quad(high Dose 65+) 08/11/2019  . Influenza Split 12/02/2012  . Influenza Whole 10/21/2007, 11/08/2010  . Influenza, High Dose Seasonal PF 10/11/2015, 08/16/2016, 08/14/2017, 01/28/2019  . Influenza,inj,Quad PF,6+ Mos 09/06/2014  . Pneumococcal Conjugate-13 07/30/2016  . Pneumococcal Polysaccharide-23 04/19/2015, 11/07/2016  . Td 04/16/2005  . Tdap 08/24/2015    Plan  Recommended patient receive COVID vaccine  Medication Management   [facility] Uses pill box? Yes - keeps vials in separate medicine. Pt endorses compliance  We discussed: Pt reports her insurance plan just changed from [facility] to [place], she denies prior knowledge of this change, the new card just came in the mail. Encouraged patient to read through the materials and contact the company if she has questions.  Plan  Continue current medication management strategy    Follow up: 4 month phone visit  Charlene Brooke, PharmD Clinical Pharmacist [facility] [phone]

## 2020-06-16 ENCOUNTER — Ambulatory Visit: Payer: Medicare HMO | Admitting: Internal Medicine

## 2020-07-12 ENCOUNTER — Ambulatory Visit (INDEPENDENT_AMBULATORY_CARE_PROVIDER_SITE_OTHER): Payer: Medicare HMO | Admitting: Internal Medicine

## 2020-07-12 ENCOUNTER — Other Ambulatory Visit: Payer: Self-pay

## 2020-07-12 ENCOUNTER — Encounter: Payer: Self-pay | Admitting: Internal Medicine

## 2020-07-12 ENCOUNTER — Telehealth: Payer: Self-pay | Admitting: Internal Medicine

## 2020-07-12 VITALS — BP 172/96 | HR 90 | Temp 98.9°F | Resp 16 | Ht 61.0 in | Wt 102.0 lb

## 2020-07-12 DIAGNOSIS — E039 Hypothyroidism, unspecified: Secondary | ICD-10-CM

## 2020-07-12 DIAGNOSIS — N1832 Chronic kidney disease, stage 3b: Secondary | ICD-10-CM

## 2020-07-12 DIAGNOSIS — K625 Hemorrhage of anus and rectum: Secondary | ICD-10-CM | POA: Diagnosis not present

## 2020-07-12 DIAGNOSIS — Q828 Other specified congenital malformations of skin: Secondary | ICD-10-CM

## 2020-07-12 DIAGNOSIS — I10 Essential (primary) hypertension: Secondary | ICD-10-CM

## 2020-07-12 MED ORDER — EDARBYCLOR 40-12.5 MG PO TABS: 1.0000 | ORAL_TABLET | Freq: Every day | ORAL | 0 refills | Status: DC

## 2020-07-12 MED ORDER — EDARBI 40 MG PO TABS: 1.0000 | ORAL_TABLET | Freq: Every day | ORAL | 0 refills | Status: DC

## 2020-07-12 NOTE — Progress Notes (Signed)
Subjective:  Patient ID: Michelle [name], female    DOB: Dec 03, 1951  Age: 69 y.o. MRN: [id number]  CC: Hypothyroidism  This visit occurred during the SARS-CoV-2 public health emergency.  Safety protocols were in place, including screening questions prior to the visit, additional usage of staff PPE, and extensive cleaning of exam room while observing appropriate contact time as indicated for disinfecting solutions.    HPI Michelle [name] presents for f/up - She complains of a 1 month history of melena and bright red blood per rectum with abdominal pain, fatigue, and weight loss.  She has been taking an unspecified over-the-counter pain pill.  She also complains that her blood pressure is not well controlled and she has had some headaches recently.  She denies nausea, vomiting, blurred vision, chest pain, shortness of breath, palpitations, or edema.  Outpatient Medications Prior to Visit  Medication Sig Dispense Refill  . amitriptyline (ELAVIL) 50 MG tablet Take 50 mg by mouth at bedtime as needed for sleep.    Marland Kitchen aspirin 81 MG chewable tablet Chew 81 mg by mouth daily.     . cetirizine (ZYRTEC) 10 MG tablet Take 1 tablet (10 mg total) by mouth 2 (two) times daily. 180 tablet 1  . cholecalciferol (VITAMIN D) 1000 UNITS tablet Take 1,000 Units by mouth daily.     Marland Kitchen donepezil (ARICEPT) 10 MG tablet     . levothyroxine (SYNTHROID) 50 MCG tablet TAKE 1 TABLET BY MOUTH EVERY DAY 90 tablet 1  . Omega-3 Fatty Acids (FISH OIL) 1000 MG CAPS Take 2 capsules by mouth daily.     . simvastatin (ZOCOR) 10 MG tablet Take 1 tablet (10 mg total) by mouth daily. 90 tablet 1  . traMADol (ULTRAM) 50 MG tablet Take 1-2 tablets (50-100 mg total) by mouth every 6 (six) hours as needed for moderate pain. Post-operatively. 15 tablet 0  . triamcinolone ointment (KENALOG) 0.1 % Apply topically 2 (two) times daily. 453.6 g 2  . sertraline (ZOLOFT) 100 MG tablet Take 1 tablet (100 mg total) by mouth daily. 90 tablet 1  .  torsemide (DEMADEX) 10 MG tablet Take by mouth.      No facility-administered medications prior to visit.    ROS Review of Systems  Constitutional: Positive for fatigue. Negative for appetite change, chills, diaphoresis and unexpected weight change.  HENT: Negative.  Negative for trouble swallowing.   Eyes: Negative for visual disturbance.  Gastrointestinal: Positive for abdominal pain and blood in stool. Negative for constipation, diarrhea, nausea and vomiting.  Endocrine: Negative for cold intolerance and heat intolerance.  Genitourinary: Negative.  Negative for decreased urine volume, difficulty urinating and urgency.  Musculoskeletal: Negative.   Skin: Positive for rash.  Neurological: Positive for headaches. Negative for dizziness, weakness and light-headedness.  Hematological: Negative for adenopathy. Does not bruise/bleed easily.  Psychiatric/Behavioral: Negative.     Objective:  BP (!) 172/96 (BP Location: Left Arm, Patient Position: Sitting, Cuff Size: Normal)   Pulse 90   Temp 98.9 F (37.2 C) (Oral)   Resp 16   Ht 5\' 1"  (1.549 m)   Wt 102 lb (46.3 kg)   SpO2 96%   BMI 19.27 kg/m   BP Readings from Last 3 Encounters:  07/12/20 (!) 172/96  04/22/20 128/78  01/13/20 130/80    Wt Readings from Last 3 Encounters:  07/12/20 102 lb (46.3 kg)  04/22/20 106 lb 12.8 oz (48.4 kg)  01/13/20 109 lb (49.4 kg)    Physical Exam Vitals reviewed.  Exam conducted with a chaperone present Everette Rank).  Constitutional:      Appearance: Normal appearance.  HENT:     Nose: Nose normal.     Mouth/Throat:     Mouth: Mucous membranes are moist.  Eyes:     General: No scleral icterus.    Conjunctiva/sclera: Conjunctivae normal.  Cardiovascular:     Rate and Rhythm: Normal rate and regular rhythm.     Heart sounds: S1 normal and S2 normal. Murmur heard.  Systolic murmur is present with a grade of 1/6.  No diastolic murmur is present.   Pulmonary:     Effort: Pulmonary  effort is normal.     Breath sounds: No stridor. No wheezing, rhonchi or rales.  Abdominal:     General: Abdomen is flat. Bowel sounds are normal. There is no distension.     Palpations: Abdomen is soft. There is no hepatomegaly, splenomegaly or mass.     Tenderness: There is no abdominal tenderness.  Genitourinary:    Rectum: Normal. Guaiac result negative. No mass, tenderness, anal fissure, external hemorrhoid or internal hemorrhoid. Normal anal tone.  Musculoskeletal:     Right lower leg: No edema.     Left lower leg: No edema.  Skin:    General: Skin is warm and dry.     Coloration: Skin is not pale.     Comments: Most of her skin is covered with yellow scale, coalesced papules, some erythema, and a few excoriations.  There is no exudate, induration, or fluctuance.  Neurological:     General: No focal deficit present.     Mental Status: She is alert and oriented to person, place, and time. Mental status is at baseline.  Psychiatric:        Mood and Affect: Mood normal.        Behavior: Behavior normal.     Lab Results  Component Value Date   WBC 7.6 07/12/2020   HGB 13.6 07/12/2020   HCT 41.5 07/12/2020   PLT 262 07/12/2020   GLUCOSE 54 (L) 07/12/2020   CHOL 157 01/13/2020   TRIG 139.0 01/13/2020   HDL 44.20 01/13/2020   LDLDIRECT 113 (H) 06/16/2008   LDLCALC 85 01/13/2020   ALT 7 01/13/2020   AST 19 01/13/2020   NA 142 07/12/2020   K 3.9 07/12/2020   CL 106 07/12/2020   CREATININE 1.03 (H) 07/12/2020   BUN 10 07/12/2020   CO2 28 07/12/2020   TSH 2.01 07/12/2020   INR 1.10 08/07/2017    DG Bone Density  Result Date: 01/14/2020 Date of study: 01/13/2020 Exam: DUAL X-RAY ABSORPTIOMETRY (DXA) FOR BONE MINERAL DENSITY (BMD) Instrument: [employer] Provider: PCP Indication: screening for osteoporosis Comparison:  none (please note that it is not possible to compare data from different instruments) Clinical data: Pt is a 69 y.o. female Results:   Lumbar spine L1-L4 Femoral neck (FN) T-score  -2.5 RFN: -2.8 LFN: -2.8 Assessment: Patient has OSTEOPOROSIS according to the [facility] classification for osteoporosis (see below). Z Score compares the patients bone density to age, sex, and race matched controls.  Compared to age, sex, and race matched controls, this patient's bone density is average. Fracture risk: high Comments: the technical quality of the study is good. In patients older than 75, the high prevalence of degenerative changes in the lumbar spine results in artificially higher bone density readings. WHO criteria for diagnosis of osteoporosis in postmenopausal women and in men 27 y/o or older: - normal:  T-score -1.0 to + 1.0 - osteopenia/low bone density: T-score between -2.5 and -1.0 - osteoporosis: T-score below -2.5 - severe osteoporosis: T-score below -2.5 with history of fragility fracture Note: although not part of the WHO classification, the presence of a fragility fracture, regardless of the T-score, should be considered diagnostic of osteoporosis, provided other causes for the fracture have been excluded. Treatment: The National Osteoporosis Foundation recommends that treatment be considered in postmenopausal women and men age 9 or older with: 1. Hip or vertebral (clinical or morphometric) fracture 2. T-score of - 2.5 or lower at the spine or hip 3. 10-year fracture probability by FRAX of at least 20% for a major osteoporotic fracture and 3% for a hip fracture Recommendations: ? Recommend optimizing calcium (1200 mg/day) and vitamin D (800 IU/day). ? Consider therapy with bisphosphonate (alendronate, risedronate, or ibandronate) if not contraindicated ? Consider IV bisphosphonate therapy (zoledronic acid), especially if oral therapy is contraindicated or not              tolerated. ? Consider Denosumab Followup: Repeat BMD in 2 -3 years or as clinically indicated Interpreted by : Mack Guise, MD [facility]    Assessment &  Plan:   Keshara was seen today for hypothyroidism.  Diagnoses and all orders for this visit:  Essential hypertension- Her blood pressure is not adequately well controlled and she is symptomatic with headache and declining renal function.  I recommended that she add an ARB. -     CBC with Differential/Platelet; Future -     BASIC METABOLIC PANEL WITH GFR; Future -     Discontinue: Azilsartan Medoxomil (EDARBI) 40 MG TABS; Take 1 tablet by mouth daily. -     Urinalysis, Routine w reflex microscopic; Future -     Discontinue: Azilsartan-Chlorthalidone (EDARBYCLOR) 40-12.5 MG TABS; Take 1 tablet by mouth daily. -     Urinalysis, Routine w reflex microscopic -     BASIC METABOLIC PANEL WITH GFR -     CBC with Differential/Platelet  Acquired hypothyroidism- Her TSH is in the normal range.  She will remain on the current dose of levothyroxine. -     TSH; Future -     TSH  Stage 3b chronic kidney disease- Her blood pressure is not adequately well controlled.  I am concerned the OTC medication she is taking is an NSAID so I have asked her to stop taking it. Will try to get better control of her BP. -     CBC with Differential/Platelet; Future -     BASIC METABOLIC PANEL WITH GFR; Future -     Urinalysis, Routine w reflex microscopic; Future -     Urinalysis, Routine w reflex microscopic -     BASIC METABOLIC PANEL WITH GFR -     CBC with Differential/Platelet  BRBPR (bright red blood per rectum)- Her abdominal exam is benign.  I do not detect any blood in the stool today.  Her lab work is reassuring that she does not have a significant GI bleed.  I have asked her to see GI to see if she needs to undergo upper and lower endoscopies. -     Ambulatory referral to Gastroenterology  Darier-White disease -     Ambulatory referral to Dermatology  Other orders -     MICROSCOPIC MESSAGE   I have discontinued Bonnita Nasuti I. Zellers's torsemide and Edarbi. I am also having her maintain her  cholecalciferol, aspirin, Fish Oil, amitriptyline, traMADol, cetirizine, triamcinolone ointment, simvastatin,  levothyroxine, and donepezil.  Meds ordered this encounter  Medications  . DISCONTD: Azilsartan Medoxomil (EDARBI) 40 MG TABS    Sig: Take 1 tablet by mouth daily.    Dispense:  90 tablet    Refill:  0  . DISCONTD: Azilsartan-Chlorthalidone (EDARBYCLOR) 40-12.5 MG TABS    Sig: Take 1 tablet by mouth daily.    Dispense:  90 tablet    Refill:  0   I spent 50 minutes in preparing to see the patient by review of recent labs, imaging and procedures, obtaining and reviewing separately obtained history, communicating with the patient and family or caregiver, ordering medications, tests or procedures, and documenting clinical information in the EHR including the differential Dx, treatment, and any further evaluation and other management of 1. Essential hypertension 2. Acquired hypothyroidism 3. Stage 3b chronic kidney disease 4. BRBPR (bright red blood per rectum) 5. Darier-White disease      Follow-up: Return in about 6 weeks (around 08/23/2020).  [name], MD

## 2020-07-12 NOTE — Patient Instructions (Signed)

## 2020-07-12 NOTE — Telephone Encounter (Signed)
Sent rx to [facility] per patient request.

## 2020-07-12 NOTE — Telephone Encounter (Signed)
New Message:   Pt states she just left the office and her AVS says to pick up her medications at [facility]. Pt states she uses [id number] - [place], [place] - [address] for all of her medication and would like them sent there. She states to please give her a call when they have been sent to [facility]. Please advise.

## 2020-07-13 ENCOUNTER — Encounter: Payer: Self-pay | Admitting: Internal Medicine

## 2020-07-13 LAB — URINALYSIS, ROUTINE W REFLEX MICROSCOPIC
Bacteria, UA: NONE SEEN /HPF
Bilirubin Urine: NEGATIVE
Glucose, UA: NEGATIVE
Hgb urine dipstick: NEGATIVE
Hyaline Cast: NONE SEEN /LPF
Ketones, ur: NEGATIVE
Nitrite: NEGATIVE
Protein, ur: NEGATIVE
Specific Gravity, Urine: 1.018 (ref 1.001–1.03)
WBC, UA: NONE SEEN /HPF (ref 0–5)
pH: 5.5 (ref 5.0–8.0)

## 2020-07-13 LAB — CBC WITH DIFFERENTIAL/PLATELET
Absolute Monocytes: 600 cells/uL (ref 200–950)
Basophils Absolute: 99 cells/uL (ref 0–200)
Basophils Relative: 1.3 %
Eosinophils Absolute: 228 cells/uL (ref 15–500)
Eosinophils Relative: 3 %
HCT: 41.5 % (ref 35.0–45.0)
Hemoglobin: 13.6 g/dL (ref 11.7–15.5)
Lymphs Abs: 2082 cells/uL (ref 850–3900)
MCH: 30 pg (ref 27.0–33.0)
MCHC: 32.8 g/dL (ref 32.0–36.0)
MCV: 91.6 fL (ref 80.0–100.0)
MPV: 10.4 fL (ref 7.5–12.5)
Monocytes Relative: 7.9 %
Neutro Abs: 4590 cells/uL (ref 1500–7800)
Neutrophils Relative %: 60.4 %
Platelets: 262 10*3/uL (ref 140–400)
RBC: 4.53 10*6/uL (ref 3.80–5.10)
RDW: 13.1 % (ref 11.0–15.0)
Total Lymphocyte: 27.4 %
WBC: 7.6 10*3/uL (ref 3.8–10.8)

## 2020-07-13 LAB — BASIC METABOLIC PANEL WITH GFR
BUN/Creatinine Ratio: 10 (calc) (ref 6–22)
BUN: 10 mg/dL (ref 7–25)
CO2: 28 mmol/L (ref 20–32)
Calcium: 8.8 mg/dL (ref 8.6–10.4)
Chloride: 106 mmol/L (ref 98–110)
Creat: 1.03 mg/dL — ABNORMAL HIGH (ref 0.60–0.93)
GFR, Est African American: 64 mL/min/{1.73_m2} (ref >=60)
GFR, Est Non African American: 55 mL/min/{1.73_m2} — ABNORMAL LOW (ref >=60)
Glucose, Bld: 54 mg/dL — ABNORMAL LOW (ref 65–99)
Potassium: 3.9 mmol/L (ref 3.5–5.3)
Sodium: 142 mmol/L (ref 135–146)

## 2020-07-13 LAB — TSH: TSH: 2.01 mIU/L (ref 0.40–4.50)

## 2020-07-14 ENCOUNTER — Other Ambulatory Visit: Payer: Self-pay | Admitting: Internal Medicine

## 2020-07-14 ENCOUNTER — Encounter: Payer: Self-pay | Admitting: Nurse Practitioner

## 2020-07-14 DIAGNOSIS — F419 Anxiety disorder, unspecified: Secondary | ICD-10-CM

## 2020-07-26 ENCOUNTER — Other Ambulatory Visit: Payer: Self-pay | Admitting: Internal Medicine

## 2020-08-03 ENCOUNTER — Telehealth: Payer: Medicare HMO | Admitting: Internal Medicine

## 2020-08-15 ENCOUNTER — Encounter: Payer: Self-pay | Admitting: Internal Medicine

## 2020-08-15 ENCOUNTER — Ambulatory Visit (INDEPENDENT_AMBULATORY_CARE_PROVIDER_SITE_OTHER): Payer: Medicare HMO | Admitting: Internal Medicine

## 2020-08-15 ENCOUNTER — Other Ambulatory Visit: Payer: Self-pay

## 2020-08-15 VITALS — BP 138/86 | HR 70 | Temp 98.1°F | Resp 16 | Ht 61.0 in | Wt 98.0 lb

## 2020-08-15 DIAGNOSIS — K582 Mixed irritable bowel syndrome: Secondary | ICD-10-CM | POA: Diagnosis not present

## 2020-08-15 DIAGNOSIS — Z23 Encounter for immunization: Secondary | ICD-10-CM | POA: Diagnosis not present

## 2020-08-15 DIAGNOSIS — I10 Essential (primary) hypertension: Secondary | ICD-10-CM | POA: Diagnosis not present

## 2020-08-15 MED ORDER — DICYCLOMINE HCL 10 MG PO CAPS: 10.0000 mg | ORAL_CAPSULE | Freq: Three times a day (TID) | ORAL | 3 refills | Status: DC

## 2020-08-15 NOTE — Progress Notes (Signed)
Subjective:  Patient ID: Michelle [name], female    DOB: 03-Aug-1951  Age: 69 y.o. MRN: [id number]  CC: Constipation, Abdominal Pain, and Hypertension  This visit occurred during the SARS-CoV-2 public health emergency.  Safety protocols were in place, including screening questions prior to the visit, additional usage of staff PPE, and extensive cleaning of exam room while observing appropriate contact time as indicated for disinfecting solutions.    HPI Michelle [name] presents for f/up - She complains of a 3-week history of abdominal pain in both lower quadrants.  She describes it as a nagging sensation with intermittent cramping and tightness.  She has also had alternating constipation and diarrhea.  She denies nausea, vomiting, loss of appetite, weight loss, fever, chills, dysuria, hematuria, melena, or bright red blood per rectum.  Outpatient Medications Prior to Visit  Medication Sig Dispense Refill  . amitriptyline (ELAVIL) 50 MG tablet TAKE 1 TABLET BY MOUTH EVERYDAY AT BEDTIME 90 tablet 1  . aspirin 81 MG chewable tablet Chew 81 mg by mouth daily.     . Azilsartan-Chlorthalidone (EDARBYCLOR) 40-12.5 MG TABS Take 1 tablet by mouth daily. 90 tablet 0  . cetirizine (ZYRTEC) 10 MG tablet Take 1 tablet (10 mg total) by mouth 2 (two) times daily. 180 tablet 1  . cholecalciferol (VITAMIN D) 1000 UNITS tablet Take 1,000 Units by mouth daily.     Marland Kitchen donepezil (ARICEPT) 10 MG tablet     . levothyroxine (SYNTHROID) 50 MCG tablet TAKE 1 TABLET BY MOUTH EVERY DAY 90 tablet 1  . Omega-3 Fatty Acids (FISH OIL) 1000 MG CAPS Take 2 capsules by mouth daily.     . sertraline (ZOLOFT) 100 MG tablet TAKE 1 TABLET BY MOUTH EVERY DAY 90 tablet 1  . simvastatin (ZOCOR) 10 MG tablet Take 1 tablet (10 mg total) by mouth daily. 90 tablet 1  . traMADol (ULTRAM) 50 MG tablet Take 1-2 tablets (50-100 mg total) by mouth every 6 (six) hours as needed for moderate pain. Post-operatively. 15 tablet 0  . triamcinolone  ointment (KENALOG) 0.1 % Apply topically 2 (two) times daily. 453.6 g 2   No facility-administered medications prior to visit.    ROS Review of Systems  Constitutional: Negative.  Negative for appetite change, chills, diaphoresis, fatigue and fever.  HENT: Negative.   Eyes: Negative.   Respiratory: Negative for cough, chest tightness, shortness of breath and wheezing.   Cardiovascular: Negative for chest pain, palpitations and leg swelling.  Gastrointestinal: Positive for abdominal pain, constipation and diarrhea. Negative for blood in stool, nausea and vomiting.  Endocrine: Negative.   Genitourinary: Negative.  Negative for difficulty urinating, dysuria and hematuria.  Musculoskeletal: Negative for arthralgias, back pain, myalgias and neck pain.  Skin: Negative.  Negative for color change and pallor.  Neurological: Negative.  Negative for dizziness, weakness and headaches.  Hematological: Negative for adenopathy. Does not bruise/bleed easily.  Psychiatric/Behavioral: Negative.     Objective:  BP 138/86   Pulse 70   Temp 98.1 F (36.7 C) (Oral)   Resp 16   Ht 5\' 1"  (1.549 m)   Wt 98 lb (44.5 kg)   SpO2 95%   BMI 18.52 kg/m   BP Readings from Last 3 Encounters:  08/15/20 138/86  07/12/20 (!) 172/96  04/22/20 128/78    Wt Readings from Last 3 Encounters:  08/15/20 98 lb (44.5 kg)  07/12/20 102 lb (46.3 kg)  04/22/20 106 lb 12.8 oz (48.4 kg)    Physical Exam Vitals reviewed.  Constitutional:      Appearance: She is well-developed. She is not ill-appearing.  HENT:     Nose: Nose normal.  Eyes:     General: No scleral icterus. Cardiovascular:     Rate and Rhythm: Normal rate.     Heart sounds: Murmur heard.   Pulmonary:     Effort: Pulmonary effort is normal.     Breath sounds: No stridor. No wheezing, rhonchi or rales.  Abdominal:     General: Abdomen is flat. Bowel sounds are increased. There is no distension.     Palpations: There is no hepatomegaly,  splenomegaly or mass.     Tenderness: There is generalized abdominal tenderness.  Musculoskeletal:        General: Normal range of motion.     Cervical back: Neck supple.  Lymphadenopathy:     Cervical: No cervical adenopathy.  Skin:    General: Skin is warm and dry.  Neurological:     General: No focal deficit present.     Mental Status: She is alert.  Psychiatric:        Mood and Affect: Mood normal.        Behavior: Behavior normal.     Lab Results  Component Value Date   WBC 7.6 07/12/2020   HGB 13.6 07/12/2020   HCT 41.5 07/12/2020   PLT 262 07/12/2020   GLUCOSE 54 (L) 07/12/2020   CHOL 157 01/13/2020   TRIG 139.0 01/13/2020   HDL 44.20 01/13/2020   LDLDIRECT 113 (H) 06/16/2008   LDLCALC 85 01/13/2020   ALT 7 01/13/2020   AST 19 01/13/2020   NA 142 07/12/2020   K 3.9 07/12/2020   CL 106 07/12/2020   CREATININE 1.03 (H) 07/12/2020   BUN 10 07/12/2020   CO2 28 07/12/2020   TSH 2.01 07/12/2020   INR 1.10 08/07/2017    DG Bone Density  Result Date: 01/14/2020 Date of study: 01/13/2020 Exam: DUAL X-RAY ABSORPTIOMETRY (DXA) FOR BONE MINERAL DENSITY (BMD) Instrument: [employer] Provider: PCP Indication: screening for osteoporosis Comparison:  none (please note that it is not possible to compare data from different instruments) Clinical data: Pt is a 69 y.o. female Results:  Lumbar spine L1-L4 Femoral neck (FN) T-score  -2.5 RFN: -2.8 LFN: -2.8 Assessment: Patient has OSTEOPOROSIS according to the [facility] classification for osteoporosis (see below). Z Score compares the patients bone density to age, sex, and race matched controls.  Compared to age, sex, and race matched controls, this patient's bone density is average. Fracture risk: high Comments: the technical quality of the study is good. In patients older than 75, the high prevalence of degenerative changes in the lumbar spine results in artificially higher bone density readings. WHO criteria for diagnosis  of osteoporosis in postmenopausal women and in men 27 y/o or older: - normal: T-score -1.0 to + 1.0 - osteopenia/low bone density: T-score between -2.5 and -1.0 - osteoporosis: T-score below -2.5 - severe osteoporosis: T-score below -2.5 with history of fragility fracture Note: although not part of the WHO classification, the presence of a fragility fracture, regardless of the T-score, should be considered diagnostic of osteoporosis, provided other causes for the fracture have been excluded. Treatment: The National Osteoporosis Foundation recommends that treatment be considered in postmenopausal women and men age 73 or older with: 1. Hip or vertebral (clinical or morphometric) fracture 2. T-score of - 2.5 or lower at the spine or hip 3. 10-year fracture probability by FRAX of at least 20% for  a major osteoporotic fracture and 3% for a hip fracture Recommendations: ? Recommend optimizing calcium (1200 mg/day) and vitamin D (800 IU/day). ? Consider therapy with bisphosphonate (alendronate, risedronate, or ibandronate) if not contraindicated ? Consider IV bisphosphonate therapy (zoledronic acid), especially if oral therapy is contraindicated or not              tolerated. ? Consider Denosumab Followup: Repeat BMD in 2 -3 years or as clinically indicated Interpreted by : Mack Guise, MD [facility]    Assessment & Plan:   Michelle [name] was seen today for constipation, abdominal pain and hypertension.  Diagnoses and all orders for this visit:  Irritable bowel syndrome with both constipation and diarrhea- Will treat with an antispasmodic. -     dicyclomine (BENTYL) 10 MG capsule; Take 1 capsule (10 mg total) by mouth 3 (three) times daily before meals.  Essential hypertension- Michelle [name] BP is adequately well controlled.  Other orders -     Flu Vaccine QUAD 6+ mos PF IM (Fluarix Quad PF)   I am having Michelle [name] start on dicyclomine. I am also having Michelle [name] maintain Michelle [name] cholecalciferol,  aspirin, Fish Oil, traMADol, cetirizine, triamcinolone ointment, simvastatin, levothyroxine, donepezil, Edarbyclor, sertraline, and amitriptyline.  Meds ordered this encounter  Medications  . dicyclomine (BENTYL) 10 MG capsule    Sig: Take 1 capsule (10 mg total) by mouth 3 (three) times daily before meals.    Dispense:  90 capsule    Refill:  3     Follow-up: Return in about 3 months (around 11/15/2020).  [name], MD

## 2020-08-15 NOTE — Patient Instructions (Signed)

## 2020-08-17 ENCOUNTER — Ambulatory Visit: Payer: Medicare HMO | Admitting: Nurse Practitioner

## 2020-09-01 ENCOUNTER — Other Ambulatory Visit: Payer: Self-pay | Admitting: Internal Medicine

## 2020-09-01 DIAGNOSIS — E785 Hyperlipidemia, unspecified: Secondary | ICD-10-CM

## 2020-09-08 ENCOUNTER — Other Ambulatory Visit: Payer: Self-pay | Admitting: Internal Medicine

## 2020-09-08 DIAGNOSIS — E039 Hypothyroidism, unspecified: Secondary | ICD-10-CM

## 2020-09-09 ENCOUNTER — Other Ambulatory Visit: Payer: Self-pay | Admitting: *Deleted

## 2020-09-09 DIAGNOSIS — F17210 Nicotine dependence, cigarettes, uncomplicated: Secondary | ICD-10-CM

## 2020-09-09 DIAGNOSIS — Z87891 Personal history of nicotine dependence: Secondary | ICD-10-CM

## 2020-09-22 ENCOUNTER — Other Ambulatory Visit: Payer: Medicare HMO

## 2020-09-22 ENCOUNTER — Inpatient Hospital Stay (HOSPITAL_COMMUNITY)
Admission: EM | Admit: 2020-09-22 | Discharge: 2020-09-25 | DRG: 177 | Disposition: A | Discharge status: Home Health | Payer: Medicare HMO | Attending: Internal Medicine | Admitting: Internal Medicine

## 2020-09-22 ENCOUNTER — Emergency Department (HOSPITAL_COMMUNITY): Payer: Medicare HMO

## 2020-09-22 DIAGNOSIS — S0101XA Laceration without foreign body of scalp, initial encounter: Secondary | ICD-10-CM | POA: Diagnosis not present

## 2020-09-22 DIAGNOSIS — Z87891 Personal history of nicotine dependence: Secondary | ICD-10-CM

## 2020-09-22 DIAGNOSIS — Z880 Allergy status to penicillin: Secondary | ICD-10-CM | POA: Diagnosis not present

## 2020-09-22 DIAGNOSIS — R197 Diarrhea, unspecified: Secondary | ICD-10-CM | POA: Diagnosis present

## 2020-09-22 DIAGNOSIS — I6529 Occlusion and stenosis of unspecified carotid artery: Secondary | ICD-10-CM | POA: Diagnosis not present

## 2020-09-22 DIAGNOSIS — R680 Hypothermia, not associated with low environmental temperature: Secondary | ICD-10-CM | POA: Diagnosis present

## 2020-09-22 DIAGNOSIS — S0083XA Contusion of other part of head, initial encounter: Secondary | ICD-10-CM | POA: Diagnosis not present

## 2020-09-22 DIAGNOSIS — I129 Hypertensive chronic kidney disease with stage 1 through stage 4 chronic kidney disease, or unspecified chronic kidney disease: Secondary | ICD-10-CM | POA: Diagnosis present

## 2020-09-22 DIAGNOSIS — E860 Dehydration: Secondary | ICD-10-CM | POA: Diagnosis not present

## 2020-09-22 DIAGNOSIS — N1832 Chronic kidney disease, stage 3b: Secondary | ICD-10-CM | POA: Diagnosis not present

## 2020-09-22 DIAGNOSIS — E861 Hypovolemia: Secondary | ICD-10-CM | POA: Diagnosis not present

## 2020-09-22 DIAGNOSIS — Z7982 Long term (current) use of aspirin: Secondary | ICD-10-CM

## 2020-09-22 DIAGNOSIS — Z8774 Personal history of (corrected) congenital malformations of heart and circulatory system: Secondary | ICD-10-CM | POA: Diagnosis not present

## 2020-09-22 DIAGNOSIS — I340 Nonrheumatic mitral (valve) insufficiency: Secondary | ICD-10-CM | POA: Diagnosis present

## 2020-09-22 DIAGNOSIS — U071 COVID-19: Secondary | ICD-10-CM | POA: Diagnosis not present

## 2020-09-22 DIAGNOSIS — D649 Anemia, unspecified: Secondary | ICD-10-CM | POA: Diagnosis not present

## 2020-09-22 DIAGNOSIS — W010XXA Fall on same level from slipping, tripping and stumbling without subsequent striking against object, initial encounter: Secondary | ICD-10-CM | POA: Diagnosis not present

## 2020-09-22 DIAGNOSIS — N179 Acute kidney failure, unspecified: Secondary | ICD-10-CM | POA: Diagnosis present

## 2020-09-22 DIAGNOSIS — E876 Hypokalemia: Secondary | ICD-10-CM | POA: Diagnosis present

## 2020-09-22 DIAGNOSIS — D6959 Other secondary thrombocytopenia: Secondary | ICD-10-CM | POA: Diagnosis present

## 2020-09-22 DIAGNOSIS — R21 Rash and other nonspecific skin eruption: Secondary | ICD-10-CM | POA: Diagnosis present

## 2020-09-22 DIAGNOSIS — S199XXA Unspecified injury of neck, initial encounter: Secondary | ICD-10-CM | POA: Diagnosis not present

## 2020-09-22 DIAGNOSIS — E785 Hyperlipidemia, unspecified: Secondary | ICD-10-CM | POA: Diagnosis not present

## 2020-09-22 DIAGNOSIS — Z820 Family history of epilepsy and other diseases of the nervous system: Secondary | ICD-10-CM

## 2020-09-22 DIAGNOSIS — Z800 Family history of malignant neoplasm of digestive organs: Secondary | ICD-10-CM

## 2020-09-22 DIAGNOSIS — M810 Age-related osteoporosis without current pathological fracture: Secondary | ICD-10-CM | POA: Diagnosis present

## 2020-09-22 DIAGNOSIS — R571 Hypovolemic shock: Secondary | ICD-10-CM | POA: Diagnosis not present

## 2020-09-22 DIAGNOSIS — Z888 Allergy status to other drugs, medicaments and biological substances status: Secondary | ICD-10-CM | POA: Diagnosis not present

## 2020-09-22 DIAGNOSIS — Z8249 Family history of ischemic heart disease and other diseases of the circulatory system: Secondary | ICD-10-CM

## 2020-09-22 DIAGNOSIS — Z23 Encounter for immunization: Secondary | ICD-10-CM

## 2020-09-22 DIAGNOSIS — S0990XA Unspecified injury of head, initial encounter: Secondary | ICD-10-CM | POA: Diagnosis not present

## 2020-09-22 DIAGNOSIS — Z79890 Hormone replacement therapy (postmenopausal): Secondary | ICD-10-CM

## 2020-09-22 DIAGNOSIS — R079 Chest pain, unspecified: Secondary | ICD-10-CM | POA: Diagnosis not present

## 2020-09-22 DIAGNOSIS — K582 Mixed irritable bowel syndrome: Secondary | ICD-10-CM | POA: Diagnosis not present

## 2020-09-22 DIAGNOSIS — I9589 Other hypotension: Secondary | ICD-10-CM | POA: Diagnosis not present

## 2020-09-22 DIAGNOSIS — E039 Hypothyroidism, unspecified: Secondary | ICD-10-CM | POA: Diagnosis not present

## 2020-09-22 DIAGNOSIS — M47817 Spondylosis without myelopathy or radiculopathy, lumbosacral region: Secondary | ICD-10-CM | POA: Diagnosis not present

## 2020-09-22 DIAGNOSIS — J439 Emphysema, unspecified: Secondary | ICD-10-CM | POA: Diagnosis present

## 2020-09-22 DIAGNOSIS — I959 Hypotension, unspecified: Secondary | ICD-10-CM | POA: Diagnosis present

## 2020-09-22 DIAGNOSIS — M545 Low back pain, unspecified: Secondary | ICD-10-CM | POA: Diagnosis not present

## 2020-09-22 DIAGNOSIS — I672 Cerebral atherosclerosis: Secondary | ICD-10-CM | POA: Diagnosis not present

## 2020-09-22 DIAGNOSIS — M47816 Spondylosis without myelopathy or radiculopathy, lumbar region: Secondary | ICD-10-CM | POA: Diagnosis not present

## 2020-09-22 DIAGNOSIS — J321 Chronic frontal sinusitis: Secondary | ICD-10-CM | POA: Diagnosis not present

## 2020-09-22 DIAGNOSIS — S3992XA Unspecified injury of lower back, initial encounter: Secondary | ICD-10-CM | POA: Diagnosis not present

## 2020-09-22 DIAGNOSIS — Z79899 Other long term (current) drug therapy: Secondary | ICD-10-CM

## 2020-09-22 DIAGNOSIS — S2241XA Multiple fractures of ribs, right side, initial encounter for closed fracture: Secondary | ICD-10-CM | POA: Diagnosis not present

## 2020-09-22 DIAGNOSIS — M4316 Spondylolisthesis, lumbar region: Secondary | ICD-10-CM | POA: Diagnosis not present

## 2020-09-22 DIAGNOSIS — I10 Essential (primary) hypertension: Secondary | ICD-10-CM | POA: Diagnosis not present

## 2020-09-22 DIAGNOSIS — Z952 Presence of prosthetic heart valve: Secondary | ICD-10-CM

## 2020-09-22 DIAGNOSIS — M47812 Spondylosis without myelopathy or radiculopathy, cervical region: Secondary | ICD-10-CM | POA: Diagnosis not present

## 2020-09-22 DIAGNOSIS — S39012A Strain of muscle, fascia and tendon of lower back, initial encounter: Secondary | ICD-10-CM | POA: Diagnosis not present

## 2020-09-22 LAB — APTT: aPTT: 30 seconds (ref 24–36)

## 2020-09-22 LAB — HEPATIC FUNCTION PANEL
ALT: 12 U/L (ref 0–44)
AST: 22 U/L (ref 15–41)
Albumin: 2.6 g/dL — ABNORMAL LOW (ref 3.5–5.0)
Alkaline Phosphatase: 31 U/L — ABNORMAL LOW (ref 38–126)
Bilirubin, Direct: <0.1 mg/dL (ref 0.0–0.2)
Total Bilirubin: 0.6 mg/dL (ref 0.3–1.2)
Total Protein: 4.6 g/dL — ABNORMAL LOW (ref 6.5–8.1)

## 2020-09-22 LAB — BASIC METABOLIC PANEL
Anion gap: 12 (ref 5–15)
Anion gap: 8 (ref 5–15)
BUN: 39 mg/dL — ABNORMAL HIGH (ref 8–23)
BUN: 29 mg/dL — ABNORMAL HIGH (ref 8–23)
CO2: 21 mmol/L — ABNORMAL LOW (ref 22–32)
CO2: 22 mmol/L (ref 22–32)
Calcium: 7.7 mg/dL — ABNORMAL LOW (ref 8.9–10.3)
Calcium: 7.5 mg/dL — ABNORMAL LOW (ref 8.9–10.3)
Chloride: 102 mmol/L (ref 98–111)
Chloride: 106 mmol/L (ref 98–111)
Creatinine, Ser: 2.09 mg/dL — ABNORMAL HIGH (ref 0.44–1.00)
Creatinine, Ser: 1.56 mg/dL — ABNORMAL HIGH (ref 0.44–1.00)
GFR calc non Af Amer: 23 mL/min — ABNORMAL LOW (ref >60)
GFR calc non Af Amer: 33 mL/min — ABNORMAL LOW (ref >60)
Glucose, Bld: 134 mg/dL — ABNORMAL HIGH (ref 70–99)
Glucose, Bld: 89 mg/dL (ref 70–99)
Potassium: 3.7 mmol/L (ref 3.5–5.1)
Potassium: 3.6 mmol/L (ref 3.5–5.1)
Sodium: 135 mmol/L (ref 135–145)
Sodium: 136 mmol/L (ref 135–145)

## 2020-09-22 LAB — COMPREHENSIVE METABOLIC PANEL
ALT: 11 U/L (ref 0–44)
AST: 20 U/L (ref 15–41)
Albumin: 2.6 g/dL — ABNORMAL LOW (ref 3.5–5.0)
Alkaline Phosphatase: 33 U/L — ABNORMAL LOW (ref 38–126)
Anion gap: 12 (ref 5–15)
BUN: 35 mg/dL — ABNORMAL HIGH (ref 8–23)
CO2: 19 mmol/L — ABNORMAL LOW (ref 22–32)
Calcium: 7.1 mg/dL — ABNORMAL LOW (ref 8.9–10.3)
Chloride: 105 mmol/L (ref 98–111)
Creatinine, Ser: 1.77 mg/dL — ABNORMAL HIGH (ref 0.44–1.00)
GFR calc non Af Amer: 29 mL/min — ABNORMAL LOW (ref >60)
Glucose, Bld: 105 mg/dL — ABNORMAL HIGH (ref 70–99)
Potassium: 3.7 mmol/L (ref 3.5–5.1)
Sodium: 136 mmol/L (ref 135–145)
Total Bilirubin: 0.6 mg/dL (ref 0.3–1.2)
Total Protein: 4.6 g/dL — ABNORMAL LOW (ref 6.5–8.1)

## 2020-09-22 LAB — URINE CULTURE

## 2020-09-22 LAB — MRSA PCR SCREENING: MRSA by PCR: NEGATIVE

## 2020-09-22 LAB — CBC WITH DIFFERENTIAL/PLATELET
Abs Immature Granulocytes: 0.01 10*3/uL (ref 0.00–0.07)
Basophils Absolute: 0 10*3/uL (ref 0.0–0.1)
Basophils Relative: 0 %
Eosinophils Absolute: 0 10*3/uL (ref 0.0–0.5)
Eosinophils Relative: 1 %
HCT: 39.4 % (ref 36.0–46.0)
Hemoglobin: 12.9 g/dL (ref 12.0–15.0)
Immature Granulocytes: 0 %
Lymphocytes Relative: 11 %
Lymphs Abs: 0.7 10*3/uL (ref 0.7–4.0)
MCH: 30.4 pg (ref 26.0–34.0)
MCHC: 32.7 g/dL (ref 30.0–36.0)
MCV: 92.9 fL (ref 80.0–100.0)
Monocytes Absolute: 0.4 10*3/uL (ref 0.1–1.0)
Monocytes Relative: 6 %
Neutro Abs: 5.3 10*3/uL (ref 1.7–7.7)
Neutrophils Relative %: 82 %
Platelets: 156 10*3/uL (ref 150–400)
RBC: 4.24 MIL/uL (ref 3.87–5.11)
RDW: 13.4 % (ref 11.5–15.5)
WBC: 6.5 10*3/uL (ref 4.0–10.5)
nRBC: 0 % (ref 0.0–0.2)

## 2020-09-22 LAB — RAPID URINE DRUG SCREEN, HOSP PERFORMED
Amphetamines: NOT DETECTED
Barbiturates: NOT DETECTED
Benzodiazepines: NOT DETECTED
Cocaine: NOT DETECTED
Opiates: NOT DETECTED
Tetrahydrocannabinol: NOT DETECTED

## 2020-09-22 LAB — URINALYSIS, ROUTINE W REFLEX MICROSCOPIC
Bilirubin Urine: NEGATIVE
Glucose, UA: NEGATIVE mg/dL
Ketones, ur: 5 mg/dL — AB
Leukocytes,Ua: NEGATIVE
Nitrite: NEGATIVE
Protein, ur: NEGATIVE mg/dL
Specific Gravity, Urine: 1.005 (ref 1.005–1.030)
pH: 5 (ref 5.0–8.0)

## 2020-09-22 LAB — CBC
HCT: 33 % — ABNORMAL LOW (ref 36.0–46.0)
Hemoglobin: 10.9 g/dL — ABNORMAL LOW (ref 12.0–15.0)
MCH: 30 pg (ref 26.0–34.0)
MCHC: 33 g/dL (ref 30.0–36.0)
MCV: 90.9 fL (ref 80.0–100.0)
Platelets: 156 10*3/uL (ref 150–400)
RBC: 3.63 MIL/uL — ABNORMAL LOW (ref 3.87–5.11)
RDW: 13.2 % (ref 11.5–15.5)
WBC: 6.1 10*3/uL (ref 4.0–10.5)
nRBC: 0 % (ref 0.0–0.2)

## 2020-09-22 LAB — RESPIRATORY PANEL BY RT PCR (FLU A&B, COVID)
Influenza A by PCR: NEGATIVE
Influenza B by PCR: NEGATIVE
SARS Coronavirus 2 by RT PCR: POSITIVE — AB

## 2020-09-22 LAB — PROTIME-INR
INR: 1.3 — ABNORMAL HIGH (ref 0.8–1.2)
Prothrombin Time: 15.9 seconds — ABNORMAL HIGH (ref 11.4–15.2)

## 2020-09-22 LAB — TSH: TSH: 1.42 u[IU]/mL (ref 0.350–4.500)

## 2020-09-22 LAB — LACTIC ACID, PLASMA
Lactic Acid, Venous: 1.4 mmol/L (ref 0.5–1.9)
Lactic Acid, Venous: 1.2 mmol/L (ref 0.5–1.9)

## 2020-09-22 LAB — TROPONIN I (HIGH SENSITIVITY)
Troponin I (High Sensitivity): 28 ng/L — ABNORMAL HIGH (ref <18)
Troponin I (High Sensitivity): 26 ng/L — ABNORMAL HIGH (ref <18)

## 2020-09-22 LAB — CBG MONITORING, ED: Glucose-Capillary: 103 mg/dL — ABNORMAL HIGH (ref 70–99)

## 2020-09-22 LAB — PHOSPHORUS: Phosphorus: 3.5 mg/dL (ref 2.5–4.6)

## 2020-09-22 LAB — HIV ANTIBODY (ROUTINE TESTING W REFLEX): HIV Screen 4th Generation wRfx: NONREACTIVE

## 2020-09-22 LAB — PROCALCITONIN: Procalcitonin: <0.1 ng/mL

## 2020-09-22 LAB — MAGNESIUM: Magnesium: 1.5 mg/dL — ABNORMAL LOW (ref 1.7–2.4)

## 2020-09-22 MED ORDER — PANTOPRAZOLE SODIUM 40 MG PO TBEC
40.0000 mg | DELAYED_RELEASE_TABLET | Freq: Every day | ORAL | Status: DC
  Administered 2020-09-22 – 2020-09-25 (×4): 40 mg via ORAL
  Filled 2020-09-22 (×4): qty 1

## 2020-09-22 MED ORDER — CHLORHEXIDINE GLUCONATE CLOTH 2 % EX PADS
6.0000 | MEDICATED_PAD | Freq: Every day | CUTANEOUS | Status: DC
  Administered 2020-09-22 (×2): 6 via TOPICAL

## 2020-09-22 MED ORDER — LACTATED RINGERS IV BOLUS
1000.0000 mL | Freq: Once | INTRAVENOUS | Status: AC
  Administered 2020-09-22: 1000 mL via INTRAVENOUS

## 2020-09-22 MED ORDER — LACTATED RINGERS IV BOLUS (SEPSIS)
1000.0000 mL | Freq: Once | INTRAVENOUS | Status: AC
  Administered 2020-09-22: 1000 mL via INTRAVENOUS

## 2020-09-22 MED ORDER — LACTATED RINGERS IV BOLUS
500.0000 mL | Freq: Once | INTRAVENOUS | Status: AC
  Administered 2020-09-22: 500 mL via INTRAVENOUS

## 2020-09-22 MED ORDER — SODIUM CHLORIDE 0.9 % IV SOLN
Freq: Once | INTRAVENOUS | Status: AC
  Filled 2020-09-22: qty 20

## 2020-09-22 MED ORDER — PHENYLEPHRINE HCL-NACL 10-0.9 MG/250ML-% IV SOLN
25.0000 ug/min | INTRAVENOUS | Status: DC
  Administered 2020-09-22: 25 ug/min via INTRAVENOUS

## 2020-09-22 MED ORDER — VANCOMYCIN HCL IN DEXTROSE 1-5 GM/200ML-% IV SOLN
1000.0000 mg | Freq: Once | INTRAVENOUS | Status: AC
  Administered 2020-09-22: 1000 mg via INTRAVENOUS
  Filled 2020-09-22: qty 200

## 2020-09-22 MED ORDER — ASPIRIN 81 MG PO CHEW
81.0000 mg | CHEWABLE_TABLET | Freq: Every day | ORAL | Status: DC
  Administered 2020-09-22 – 2020-09-25 (×4): 81 mg via ORAL
  Filled 2020-09-22 (×4): qty 1

## 2020-09-22 MED ORDER — LACTATED RINGERS IV BOLUS (SEPSIS)
500.0000 mL | Freq: Once | INTRAVENOUS | Status: AC
  Administered 2020-09-22: 500 mL via INTRAVENOUS

## 2020-09-22 MED ORDER — MAGNESIUM SULFATE 4 GM/100ML IV SOLN
4.0000 g | Freq: Once | INTRAVENOUS | Status: AC
  Administered 2020-09-22: 4 g via INTRAVENOUS
  Filled 2020-09-22 (×2): qty 100

## 2020-09-22 MED ORDER — HEPARIN SODIUM (PORCINE) 5000 UNIT/ML IJ SOLN
5000.0000 [IU] | Freq: Three times a day (TID) | INTRAMUSCULAR | Status: DC
  Administered 2020-09-22 – 2020-09-25 (×9): 5000 [IU] via SUBCUTANEOUS
  Filled 2020-09-22 (×9): qty 1

## 2020-09-22 MED ORDER — LEVOTHYROXINE SODIUM 50 MCG PO TABS
50.0000 ug | ORAL_TABLET | Freq: Every day | ORAL | Status: DC
  Administered 2020-09-22 – 2020-09-25 (×4): 50 ug via ORAL
  Filled 2020-09-22 (×4): qty 1

## 2020-09-22 MED ORDER — SODIUM CHLORIDE 0.9 % IV SOLN: 250.0000 mL | INTRAVENOUS | Status: DC

## 2020-09-22 MED ORDER — ALBUTEROL SULFATE HFA 108 (90 BASE) MCG/ACT IN AERS
1.0000 | INHALATION_SPRAY | RESPIRATORY_TRACT | Status: DC | PRN
  Filled 2020-09-22: qty 6.7

## 2020-09-22 MED ORDER — SODIUM CHLORIDE 0.9 % IV SOLN
2.0000 g | Freq: Once | INTRAVENOUS | Status: AC
  Administered 2020-09-22: 2 g via INTRAVENOUS
  Filled 2020-09-22: qty 2

## 2020-09-22 MED ORDER — POTASSIUM CHLORIDE CRYS ER 20 MEQ PO TBCR
40.0000 meq | EXTENDED_RELEASE_TABLET | Freq: Once | ORAL | Status: AC
  Administered 2020-09-22: 40 meq via ORAL
  Filled 2020-09-22: qty 2

## 2020-09-22 MED ORDER — METRONIDAZOLE IN NACL 5-0.79 MG/ML-% IV SOLN
500.0000 mg | Freq: Once | INTRAVENOUS | Status: AC
  Administered 2020-09-22: 500 mg via INTRAVENOUS
  Filled 2020-09-22: qty 100

## 2020-09-22 MED ORDER — LACTATED RINGERS IV SOLN: INTRAVENOUS | Status: DC

## 2020-09-22 MED ORDER — POLYETHYLENE GLYCOL 3350 17 G PO PACK: 17.0000 g | PACK | Freq: Every day | ORAL | Status: DC | PRN

## 2020-09-22 MED ORDER — PHENYLEPHRINE HCL-NACL 10-0.9 MG/250ML-% IV SOLN
0.0000 ug/min | INTRAVENOUS | Status: DC
  Administered 2020-09-22: 20 ug/min via INTRAVENOUS
  Filled 2020-09-22: qty 250

## 2020-09-22 MED ORDER — SIMVASTATIN 20 MG PO TABS
10.0000 mg | ORAL_TABLET | Freq: Every day | ORAL | Status: DC
  Administered 2020-09-22 – 2020-09-24 (×3): 10 mg via ORAL
  Filled 2020-09-22: qty 1
  Filled 2020-09-22: qty 0.5
  Filled 2020-09-22 (×2): qty 1

## 2020-09-22 MED ORDER — NOREPINEPHRINE 4 MG/250ML-% IV SOLN
2.0000 ug/min | INTRAVENOUS | Status: DC
  Administered 2020-09-22: 2 ug/min via INTRAVENOUS
  Filled 2020-09-22: qty 250

## 2020-09-22 MED ORDER — DOCUSATE SODIUM 100 MG PO CAPS: 100.0000 mg | ORAL_CAPSULE | Freq: Two times a day (BID) | ORAL | Status: DC | PRN

## 2020-09-22 MED ORDER — SODIUM CHLORIDE 0.9 % IV SOLN
1.0000 g | Freq: Three times a day (TID) | INTRAVENOUS | Status: DC
  Filled 2020-09-22: qty 1

## 2020-09-22 MED ORDER — POTASSIUM CHLORIDE 10 MEQ/100ML IV SOLN
10.0000 meq | Freq: Once | INTRAVENOUS | Status: AC
  Administered 2020-09-22: 10 meq via INTRAVENOUS
  Filled 2020-09-22: qty 100

## 2020-09-22 MED ORDER — VANCOMYCIN HCL 500 MG/100ML IV SOLN: 500.0000 mg | INTRAVENOUS | Status: DC
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
## 2020-09-22 NOTE — Progress Notes (Signed)
Spoke with her significant other Lelon Perla [phone]. She currently lives with him. He also has covid. He was diagnosed Thursday Sept 30. This is her point of contact person.
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
## 2020-09-22 NOTE — ED Provider Notes (Signed)
Medical screening examination/treatment/procedure(s) were conducted as a shared visit with non-physician practitioner(s) and myself.  I personally evaluated the patient during the encounter.  EKG Interpretation  Date/Time:  Thursday September 22 2020 00:39:40 EDT Ventricular Rate:  72 PR Interval:    QRS Duration: 177 QT Interval:  573 QTC Calculation: 513 R Axis:   93 Text Interpretation: Sinus rhythm Ventricular bigeminy Right bundle branch block Confirmed by Thayer Jew [id number]) on 09/22/2020 12:42:23 AM  This is a 69 year old female who presents with a chief complaint of lightheadedness and near syncope. Patient reports that she has felt very lightheaded when trying to stand over the last several days. She had to lower herself to the ground prior to calling EMS. Prior to this she has noted diarrhea. No shortness of breath, fevers, chest pain. She does report a Covid exposure. She has been vaccinated. No urinary symptoms, weakness, numbness, strokelike symptoms.  Patient noted to be hypotensive and hypothermic on initial evaluation. Sepsis work-up initiated including 30 cc/kg fluid and broad-spectrum antibiotics. Clinically she is very dry appearing. Work-up notable for an AKI with a creatinine of 2. Otherwise no leukocytosis, no obvious infectious etiology including normal chest x-ray and urinalysis. TSH is also normal. She not been on any recent steroids to suggest adrenal insufficiency as the cause of her hypotension. EKG shows ventricular bigeminy which is new for her. Troponin is stable in the upper 20s.  Patient was started on a small dose of peripheral vasopressor given persistent hypotension after 2500 cc of fluid. Critical care was consulted. Recommend ongoing fluid resuscitation and transition to peripheral phenylephrine. Bedside ultrasound shows significantly collapsible IVC. Suspect she is still very volume depleted. We will continue fluid resuscitation and await critical care  evaluation. Of note, she did return Covid positive.    EMERGENCY DEPARTMENT [place] CARDIAC EXAM "Study: Limited Ultrasound of the Heart and Pericardium"  INDICATIONS:Abnormal vital signs Multiple views of the heart and pericardium were obtained in real-time with a multi-frequency probe.  PERFORMED QD:UKRCVK IMAGES ARCHIVED?: No LIMITATIONS:  Emergent procedure VIEWS USED: Subcostal 4 chamber and Inferior Vena Cava INTERPRETATION: Pericardial effusioin absent, Cardiac tamponade absent, Probable low CVP, Normal contractility and IVC flat    Dina Rich, Barbette Hair, MD 09/22/20 [phone]

## 2020-09-22 NOTE — TOC Initial Note (Signed)
Transition of Care [facility]) - Initial/Assessment Note    Patient Details  Name: Michelle [name] MRN: [id number] Date of Birth: 03/19/1951  Transition of Care [facility] CM/SW Contact:    Verdell Carmine, RN Phone Number: 09/22/2020, 12:40 PM  Clinical Narrative:                 Admitted for hypotension/syncope fluids and vasopressors off now, normotensive, COVID positive, incidental  Finding, no symptoms of COVID infection. Patient transferring to PCU today likely. CM will continue to follow for any discharge needs.   Expected Discharge Plan: Home/Self Care Barriers to Discharge: Continued Medical Work up   Patient Goals and CMS Choice        Expected Discharge Plan and Services Expected Discharge Plan: Home/Self Care       Living arrangements for the past 2 months: Single Family Home                                      Prior Living Arrangements/Services Living arrangements for the past 2 months: Single Family Home   Patient language and need for interpreter reviewed:: Yes        Need for Family Participation in Patient Care: Yes (Comment) Care giver support system in place?: Yes (comment)   Criminal Activity/Legal Involvement Pertinent to Current Situation/Hospitalization: No - Comment as needed  Activities of Daily Living      Permission Sought/Granted                  Emotional Assessment       Orientation: : Oriented to Self, Oriented to Place, Oriented to  Time, Oriented to Situation Alcohol / Substance Use: Not Applicable Psych Involvement: No (comment)  Admission diagnosis:  Hypotension [I95.9] Hypotension due to hypovolemia [I95.89, E86.1] COVID-19 [U07.1] Patient Active Problem List   Diagnosis Date Noted  . Hypotension 09/22/2020  . Irritable bowel syndrome with both constipation and diarrhea 08/15/2020  . BRBPR (bright red blood per rectum) 07/12/2020  . Stage 3b chronic kidney disease ([place]) 01/14/2020  . Tetralogy of Fallot s/p  repair 12/04/2019  . RBBB 12/04/2019  . Current mild episode of major depressive disorder without prior episode ([place]) 10/28/2017  . S/P surgical pulmonary valve replacement 03/05/2017  . S/P TVR (tricuspid valve repair) 03/05/2017  . Status post atrial septal defect repair 01/25/2017  . Moderate tricuspid regurgitation 01/01/2017  . Acquired hypothyroidism 11/08/2016  . TOF (tetralogy of Fallot) 09/27/2016  . Visit for screening mammogram 07/30/2016  . Cervical cancer screening 07/30/2016  . Osteoporosis 07/30/2016  . Cardiac murmur 05/10/2015  . Mononeuropathy of right lower extremity 11/30/2014  . Health care maintenance 11/10/2014  . Congenital heart disease in adult 09/28/2014  . Essential hypertension 09/17/2014  . Elevated blood-pressure reading without diagnosis of hypertension 09/17/2014  . Anxiety state 09/23/2012  . 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 Other specified congenital malformations of skin 06/10/2007  . Hyperlipidemia LDL goal <100 03/19/2007   PCP:  [name], MD Pharmacy:   [id number] - [place], [place] [address] Phone: [phone] Fax: [phone]  [facility] - [place], [place] - [address] [place] [address] Phone: [phone] Fax: [phone]     Social Determinants of Health (SDOH) Interventions    Readmission Risk Interventions No flowsheet data found.

## 2020-09-22 NOTE — Progress Notes (Signed)
Pharmacy Antibiotic Note  Michelle [name] is a 69 y.o. female admitted on 09/22/2020 with syncope, hypotension.  Pharmacy has been consulted for Vancomycin/Aztreonam dosing. WBC WNL, noted renal dysfunction.   Plan: Vancomycin 500 mg IV q24h Aztreonam 1g IV q8h Trend WBC, temp, renal function  F/U infectious work-up Drug levels as indicated   Height: 5\' 3"  (160 cm) Weight: 44.5 kg (98 lb) IBW/kg (Calculated) : 52.4  Temp (24hrs), Avg:96.3 F (35.7 C), Min:96.3 F (35.7 C), Max:96.3 F (35.7 C)  Recent Labs  Lab 09/22/20 0100 09/22/20 0230 09/22/20 0324  WBC 6.5  --   --   CREATININE 2.09* 1.77*  --   LATICACIDVEN  --  1.4 1.2    Estimated Creatinine Clearance: 20.8 mL/min (A) (by C-G formula based on SCr of 1.77 mg/dL (H)).    Allergies  Allergen Reactions  . Aricept [Donepezil] Other (See Comments)    bradycardia  . Penicillins Rash    Has patient had a PCN reaction causing immediate rash, facial/tongue/throat swelling, SOB or lightheadedness with hypotension: Yes Has patient had a PCN reaction causing severe rash involving mucus membranes or skin necrosis: No Has patient had a PCN reaction that required hospitalization: No Has patient had a PCN reaction occurring within the last 10 years: No If all of the above answers are "NO", then may proceed with Cephalosporin use.    Narda Bonds, PharmD, BCPS Clinical Pharmacist Phone: [phone]

## 2020-09-22 NOTE — ED Triage Notes (Addendum)
Pt arrives from home via [facility], complaining of near-syncope. Pt reports that she lowered herself to the floor upon feeling lightheaded and dizzy. Reports that she did not bump into any objects upon lowering self to floor. C/o chest pain with inhalations & headache. States that she has been experiencing intermittent diarrhea for the past month. States that her boyfriend tested positive for COVID within the last 2 weeks.

## 2020-09-22 NOTE — Progress Notes (Signed)
Pharmacy COVID-19 Monoclonal Antibody Screening   Michelle [name] was identified as being not hospitalized with symptoms from Covid-19 on admission but an incidental positive PCR has been documented.  The patient may qualify for the use of monoclonal antibodies (mAB) for COVID-19 viral infection to prevent worsening symptoms stemming from Covid-19 infection.   The patient was identified based on a positive COVID-19 PCR and not requiring the use of supplemental oxygen at this time.   This patient meets the FDA criteria for Emergency Use Authorization of casirivimab/imdevimab.   Has a (+) direct SARS-CoV-2 viral test result    Is NOT hospitalized due to COVID-19   Is within 10 days of symptom onset   Has at least one of the high risk factor(s)?for progression to severe COVID-19 and/or hospitalization as defined in EUA.   Specific high risk criteria: older age, CV disease or HTN, chronic lung disease   Additionally:   The patient has not had history of a positive COVID-19 PCR in the last 90 days.   The?patient is unvaccinated against COVID-19.   Since the patient is:  Unvaccinated and meets high risk criteria, the patient is eligible for MAB administration   This eligibility and indication for treatment was discussed with the patient's physician: Erskine Emery (PCCM)    Plan:  Based on the above discussion, it was decided that the patient will receive dose of mAb combination bam.    Arturo Morton, PharmD  09/22/2020

## 2020-09-23 DIAGNOSIS — D649 Anemia, unspecified: Secondary | ICD-10-CM

## 2020-09-23 DIAGNOSIS — E039 Hypothyroidism, unspecified: Secondary | ICD-10-CM | POA: Diagnosis not present

## 2020-09-23 DIAGNOSIS — U071 COVID-19: Secondary | ICD-10-CM | POA: Diagnosis not present

## 2020-09-23 DIAGNOSIS — I9589 Other hypotension: Secondary | ICD-10-CM | POA: Diagnosis not present

## 2020-09-23 LAB — BASIC METABOLIC PANEL
Anion gap: 7 (ref 5–15)
BUN: 13 mg/dL (ref 8–23)
CO2: 26 mmol/L (ref 22–32)
Calcium: 7.5 mg/dL — ABNORMAL LOW (ref 8.9–10.3)
Chloride: 104 mmol/L (ref 98–111)
Creatinine, Ser: 0.98 mg/dL (ref 0.44–1.00)
GFR calc non Af Amer: 58 mL/min — ABNORMAL LOW (ref >60)
Glucose, Bld: 72 mg/dL (ref 70–99)
Potassium: 4 mmol/L (ref 3.5–5.1)
Sodium: 137 mmol/L (ref 135–145)

## 2020-09-23 LAB — CBC
HCT: 31.4 % — ABNORMAL LOW (ref 36.0–46.0)
Hemoglobin: 10.3 g/dL — ABNORMAL LOW (ref 12.0–15.0)
MCH: 29.4 pg (ref 26.0–34.0)
MCHC: 32.8 g/dL (ref 30.0–36.0)
MCV: 89.7 fL (ref 80.0–100.0)
Platelets: 122 10*3/uL — ABNORMAL LOW (ref 150–400)
RBC: 3.5 MIL/uL — ABNORMAL LOW (ref 3.87–5.11)
RDW: 13.4 % (ref 11.5–15.5)
WBC: 3.4 10*3/uL — ABNORMAL LOW (ref 4.0–10.5)
nRBC: 0 % (ref 0.0–0.2)

## 2020-09-23 LAB — PROCALCITONIN: Procalcitonin: <0.1 ng/mL

## 2020-09-23 MED ORDER — LACTATED RINGERS IV BOLUS
500.0000 mL | Freq: Once | INTRAVENOUS | Status: AC
  Administered 2020-09-23: 500 mL via INTRAVENOUS

## 2020-09-23 MED ORDER — SERTRALINE HCL 100 MG PO TABS
100.0000 mg | ORAL_TABLET | Freq: Every day | ORAL | Status: DC
  Administered 2020-09-23 – 2020-09-25 (×3): 100 mg via ORAL
  Filled 2020-09-23 (×3): qty 1

## 2020-09-23 NOTE — Progress Notes (Signed)
[facility] PROGRESS NOTE   SARGUN RUMMELL SHF:[id number] DOB: May 01, 1951 DOA: 09/22/2020  PCP: [name], MD  Brief History/Interval Summary: 69 year old Caucasian female with a past medical history of tetralogy of Fallot status post repair in 1958, mitral regurgitation, essential hypertension, hyperlipidemia, hypothyroidism, chronic kidney disease stage IIIb questionable history of cognitive impairment.  Patient's boyfriend apparently tested positive for Covid about 2 weeks ago.  She presented to the hospital with complains of feeling lightheaded.  Apparently was noted to be quite hypotensive.  Blood pressure in the ED was 72/44.  She was given fluid boluses without much improvement.  She was started on Neo-Synephrine.  Subsequently admitted to the ICU.  She also tested positive for COVID-19 although did not have any respiratory symptoms.  She was stabilized and then transferred to the floor.  Reason for Visit: Hypotension likely due to hypovolemia.  COVID-19 illness.  Consultants: Seen by critical care medicine.    Procedures: None  Antibiotics: Anti-infectives (From admission, onward)   Start     Dose/Rate Route Frequency Ordered Stop   09/22/20 2200  vancomycin (VANCOREADY) IVPB 500 mg/100 mL  Status:  Discontinued        500 mg 100 mL/hr over 60 Minutes Intravenous Every 24 hours 09/22/20 0503 09/22/20 0543   09/22/20 1000  aztreonam (AZACTAM) 1 g in sodium chloride 0.9 % 100 mL IVPB  Status:  Discontinued        1 g 200 mL/hr over 30 Minutes Intravenous Every 8 hours 09/22/20 0503 09/22/20 0543   09/22/20 0130  aztreonam (AZACTAM) 2 g in sodium chloride 0.9 % 100 mL IVPB        2 g 200 mL/hr over 30 Minutes Intravenous  Once 09/22/20 0129 09/22/20 0246   09/22/20 0130  metroNIDAZOLE (FLAGYL) IVPB 500 mg        500 mg 100 mL/hr over 60 Minutes Intravenous  Once 09/22/20 0129 09/22/20 0315   09/22/20 0130  vancomycin (VANCOCIN) IVPB 1000 mg/200 mL premix         1,000 mg 200 mL/hr over 60 Minutes Intravenous  Once 09/22/20 0129 09/22/20 0316      Subjective/Interval History: Patient states that she still feels fatigued but denies any lightheadedness.  No chest pain or shortness of breath.  No nausea vomiting currently.  No diarrhea currently.  Seems somewhat distracted.  History is limited.     Assessment/Plan:  Hypotension Thought to be secondary to hypovolemia and dehydration.  Lactic acid was 1.2.  WBC was normal.  No concern for sepsis.  Patient aggressively hydrated with improvement in blood pressure.  She was on Neo-Synephrine briefly.  Weaned off of pressors.  Continue to monitor closely.  Continue IV hydration.  GI symptoms appear to be resolving.  Procalcitonin less than 0.1.  WBC 3.4 today.  No clear source of infection.  Continue to hold off on antibacterials.  Blood cultures are pending.  HIV nonreactive. Blood pressure has improved but remains on the lower side.  Continue to monitor.  COVID-19 illness Found to be positive for COVID-19.  Remains on room air saturating normal.  Chest x-ray did not show any acute findings.  Looks like she primarily has GI symptomatology.  Due to her comorbidities patient was considered for monoclonal antibody infusion.  She was given bamlanivimab and etesevimab on 10/7.  Acute kidney injury on chronic kidney disease stage IIIb/hypomagnesemia Renal function has improved.  Creatinine is down to 0.98.  Continue IV hydration.  Monitor urine  output.  She was given magnesium sulfate yesterday.  We will recheck labs tomorrow.  Thrombocytopenia Drop in platelet counts noted this morning.  Recheck tomorrow.  Patient noted to be on subcutaneous heparin.  History of essential hypertension Antihypertensives on hold.  See above.  Hyperlipidemia Continue simvastatin.  History of tetralogy of Fallot status post repair in 1958/history of mitral regurgitation Patient does have systolic murmur on examination.   Otherwise cardiac status seems to be stable.  Questionable history of cognitive impairment Patient noted to be on donepezil at home.  Hypothyroidism Continue levothyroxine.  Normocytic anemia Hemoglobin is stable.  No evidence of overt bleeding.    DVT Prophylaxis: Subcutaneous heparin Code Status: Full code Family Communication: Discussed with the patient. Disposition Plan: PT and OT evaluation.  Hopefully return home when improved  Status is: Inpatient  Remains inpatient appropriate because:IV treatments appropriate due to intensity of illness or inability to take PO and Inpatient level of care appropriate due to severity of illness   Dispo: The patient is from: Home              Anticipated d/c is to: Home              Anticipated d/c date is: 2 days              Patient currently is not medically stable to d/c.      Medications:  Scheduled: . aspirin  81 mg Oral Daily  . Chlorhexidine Gluconate Cloth  6 each Topical Daily  . heparin  5,000 Units Subcutaneous Q8H  . levothyroxine  50 mcg Oral Q0600  . pantoprazole  40 mg Oral Daily  . simvastatin  10 mg Oral q1800   Continuous: . sodium chloride    . lactated ringers 150 mL/hr at 09/23/20 0534   PXT:GGYIRSWNI, docusate sodium, polyethylene glycol   Objective:  Vital Signs  Vitals:   09/22/20 2058 09/23/20 0000 09/23/20 0100 09/23/20 0421  BP: (!) 80/57   (!) 107/50  Pulse: 70   72  Resp: 17 20  18   Temp: 97.9 F (36.6 C) 97.9 F (36.6 C) 98.5 F (36.9 C) 97.9 F (36.6 C)  TempSrc: Oral  Oral Axillary  SpO2: 95%   96%  Weight:      Height:        Intake/Output Summary (Last 24 hours) at 09/23/2020 1040 Last data filed at 09/22/2020 1500 Gross per 24 hour  Intake 1379.17 ml  Output 800 ml  Net 579.17 ml   Filed Weights   09/22/20 0046  Weight: 44.5 kg    General appearance: Awake alert.  In no distress.  Slightly distracted Resp: Clear to auscultation bilaterally.  Normal effort Cardio:  S1-S2 is normal regular.  No S3-S4.  No rubs murmurs or bruit GI: Abdomen is soft.  Nontender nondistended.  Bowel sounds are present normal.  No masses organomegaly Extremities: No edema.  Moving all her extremities Neurologic:  No focal neurological deficits.    Lab Results:  Data Reviewed: I have personally reviewed following labs and imaging studies  CBC: Recent Labs  Lab 09/22/20 0100 09/22/20 0541 09/23/20 0455  WBC 6.5 6.1 3.4*  NEUTROABS 5.3  --   --   HGB 12.9 10.9* 10.3*  HCT 39.4 33.0* 31.4*  MCV 92.9 90.9 89.7  PLT 156 156 122*    Basic Metabolic Panel: Recent Labs  Lab 09/22/20 0100 09/22/20 0230 09/22/20 0541 09/23/20 0455  NA 135 136 136 137  K 3.7  3.7 3.6 4.0  CL 102 105 106 104  CO2 21* 19* 22 26  GLUCOSE 134* 105* 89 72  BUN 39* 35* 29* 13  CREATININE 2.09* 1.77* 1.56* 0.98  CALCIUM 7.7* 7.1* 7.5* 7.5*  MG  --   --  1.5*  --   PHOS  --   --  3.5  --     GFR: Estimated Creatinine Clearance: 37.5 mL/min (by C-G formula based on SCr of 0.98 mg/dL).  Liver Function Tests: Recent Labs  Lab 09/22/20 0230 09/22/20 0541  AST 20 22  ALT 11 12  ALKPHOS 33* 31*  BILITOT 0.6 0.6  PROT 4.6* 4.6*  ALBUMIN 2.6* 2.6*    Coagulation Profile: Recent Labs  Lab 09/22/20 0129  INR 1.3*    CBG: Recent Labs  Lab 09/22/20 0120  GLUCAP 103*     Thyroid Function Tests: Recent Labs    09/22/20 0230  TSH 1.420      Recent Results (from the past 240 hour(s))  Blood Culture (routine x 2)     Status: None (Preliminary result)   Collection Time: 09/22/20  1:29 AM   Specimen: BLOOD  Result Value Ref Range Status   Specimen Description BLOOD RIGHT ARM  Final   Special Requests   Final    BOTTLES DRAWN AEROBIC AND ANAEROBIC Blood Culture results may not be optimal due to an inadequate volume of blood received in culture bottles   Culture   Final    NO GROWTH 1 DAY Performed at [facility], [place] [address] [place], [place] [address]      Report Status PENDING  Incomplete  Respiratory Panel by RT PCR (Flu A&B, Covid) - Nasopharyngeal Swab     Status: Abnormal   Collection Time: 09/22/20  1:44 AM   Specimen: Nasopharyngeal Swab  Result Value Ref Range Status   SARS Coronavirus 2 by RT PCR POSITIVE (A) NEGATIVE Final    Comment: RESULT CALLED TO, READ BACK BY AND VERIFIED WITH: S WIRTZ RN 09/22/20 0409 JDW (NOTE) SARS-CoV-2 target nucleic acids are DETECTED.  SARS-CoV-2 RNA is generally detectable in upper respiratory specimens  during the acute phase of infection. Positive results are indicative of the presence of the identified virus, but do not rule out bacterial infection or co-infection with other pathogens not detected by the test. Clinical correlation with patient history and other diagnostic information is necessary to determine patient infection status. The expected result is Negative.  Fact Sheet for Patients:  PinkCheek.be  Fact Sheet for Healthcare Providers: GravelBags.it  This test is not yet approved or cleared by the [place] FDA and  has been authorized for detection and/or diagnosis of SARS-CoV-2 by FDA under an Emergency Use Authorization (EUA).  This EUA will remain in effect (meaning this test can be used) f or the duration of  the COVID-19 declaration under Section 564(b)(1) of the Act, 21 U.S.C. section 360bbb-3(b)(1), unless the authorization is terminated or revoked sooner.      Influenza A by PCR NEGATIVE NEGATIVE Final   Influenza B by PCR NEGATIVE NEGATIVE Final    Comment: (NOTE) The Xpert Xpress SARS-CoV-2/FLU/RSV assay is intended as an aid in  the diagnosis of influenza from Nasopharyngeal swab specimens and  should not be used as a sole basis for treatment. Nasal washings and  aspirates are unacceptable for Xpert Xpress SARS-CoV-2/FLU/RSV  testing.  Fact Sheet for  Patients: PinkCheek.be  Fact Sheet for Healthcare Providers: GravelBags.it  This test is  not yet approved or cleared by the [place] and  has been authorized for detection and/or diagnosis of SARS-CoV-2 by  FDA under an Emergency Use Authorization (EUA). This EUA will remain  in effect (meaning this test can be used) for the duration of the  Covid-19 declaration under Section 564(b)(1) of the Act, 21  U.S.C. section 360bbb-3(b)(1), unless the authorization is  terminated or revoked. Performed at [facility], [place] [address], [place], [place] [address]   Urine culture     Status: Abnormal   Collection Time: 09/22/20  2:59 AM   Specimen: In/Out Cath Urine  Result Value Ref Range Status   Specimen Description IN/OUT CATH URINE  Final   Special Requests   Final    NONE Performed at [facility], [place] [address] [place], [place] [address]    Culture MULTIPLE SPECIES PRESENT, SUGGEST RECOLLECTION (A)  Final   Report Status 09/22/2020 FINAL  Final  MRSA PCR Screening     Status: None   Collection Time: 09/22/20  6:42 AM   Specimen: Nasopharyngeal  Result Value Ref Range Status   MRSA by PCR NEGATIVE NEGATIVE Final    Comment:        The GeneXpert MRSA Assay (FDA approved for NASAL specimens only), is one component of a comprehensive MRSA colonization surveillance program. It is not intended to diagnose MRSA infection nor to guide or monitor treatment for MRSA infections. Performed at [facility], [place] [address] [place], [place] [address]   Blood Culture (routine x 2)     Status: None (Preliminary result)   Collection Time: 09/22/20  6:59 AM   Specimen: BLOOD  Result Value Ref Range Status   Specimen Description BLOOD RIGHT ANTECUBITAL  Final   Special Requests   Final    BOTTLES DRAWN AEROBIC AND ANAEROBIC Blood Culture adequate volume   Culture   Final    NO GROWTH < 24 HOURS Performed  at [facility], [place] [address], [place], [place] [address]    Report Status PENDING  Incomplete      Radiology Studies: DG Chest Port 1 View  Result Date: 09/22/2020 CLINICAL DATA:  Chest pain EXAM: PORTABLE CHEST 1 VIEW COMPARISON:  January 07, 2019 FINDINGS: Heart size is stable from prior study. The patient has undergone prior valve replacement x2. The pulmonary arteries appear dilated, similar to prior study. There is no pneumothorax. No large pleural effusion. The lungs are hyperexpanded. Emphysematous changes are noted. IMPRESSION: No active disease. Electronically Signed   By: Constance Holster M.D.   On: 09/22/2020 01:17       LOS: 1 day   [facility] Pager on [email or web address]  09/23/2020, 10:40 AM

## 2020-09-23 NOTE — Evaluation (Signed)
Physical Therapy Evaluation Patient Details Name: Michelle [name] MRN: [id number] DOB: 06-01-1951 Today's Date: 09/23/2020   History of Present Illness  Pt is 68 yo female with PMH of tetralogy of Fallot s/p repair in 1958, mitral regurgitation, kidney disease, anxiety, arthritis, questionable hx of cognitive impairment. and osteoporosis.  Pt presented with lightheadedness and found to be hypotensive.  Additionally, pt found to have COVID 19.  Clinical Impression  Pt admitted with above diagnosis. Pt was able to transfer and ambulate with min guard for safety.  He O2 sats were stable with no respiratory symptoms.  BP was soft but stable and no c/o lightheadedness. Pt did require increased cues for sequencing and safety.  Per chart some hx of cognitive impairment.  Pt reports has 24 hr supervision.  Pt currently with functional limitations due to the deficits listed below (see PT Problem List). Pt will benefit from skilled PT to increase their independence and safety with mobility to allow discharge to the venue listed below.       Follow Up Recommendations No PT follow up    Equipment Recommendations  Rolling walker with 5" wheels (pt wasn't sure if she had RW)    Recommendations for Other Services       Precautions / Restrictions Precautions Precautions: Fall      Mobility  Bed Mobility Overal bed mobility: Needs Assistance Bed Mobility: Supine to Sit;Sit to Supine     Supine to sit: Supervision;HOB elevated Sit to supine: Supervision;HOB elevated      Transfers Overall transfer level: Needs assistance Equipment used: None Transfers: Sit to/from [facility] Sit to Stand: Min guard Stand pivot transfers: Min guard       General transfer comment: sit to stand x 3; min guard for safety  Ambulation/Gait Ambulation/Gait assistance: Min guard Gait Distance (Feet): 120 Feet Assistive device: None Gait Pattern/deviations: Step-through pattern;Decreased  stride length Gait velocity: decreased   General Gait Details: mild unsteadiness requiring min guard for safety; cues for posture and increased step length  Stairs            Wheelchair Mobility    Modified Rankin (Stroke Patients Only)       Balance Overall balance assessment: Needs assistance Sitting-balance support: No upper extremity supported;Feet supported Sitting balance-Leahy Scale: Good     Standing balance support: No upper extremity supported;During functional activity Standing balance-Leahy Scale: Fair Standing balance comment: performed toielting ADLs in standing with min guard for safety                             Pertinent Vitals/Pain Pain Assessment: No/denies pain    Home Living Family/patient expects to be discharged to:: Private residence Living Arrangements: Other (Comment) (close friend) Available Help at Discharge: Available 24 hours/day;Friend(s) (friend is there most of the time; also has neighbor) Type of Home: House Home Access: Other (comment) (threshold to enter)     Home Layout: One level Home Equipment: Crutches;Cane - single point;Bedside commode;Grab bars - tub/shower Additional Comments: may have RW - reports someone borrowed    Prior Function Level of Independence: Independent         Comments: independent with ADLs, IADLs, driving, and community ambulation     Hand Dominance        Extremity/Trunk Assessment   Upper Extremity Assessment Upper Extremity Assessment: Overall WFL for tasks assessed    Lower Extremity Assessment Lower Extremity Assessment: Overall WFL for tasks assessed  Cervical / Trunk Assessment Cervical / Trunk Assessment: Normal  Communication   Communication: No difficulties  Cognition Arousal/Alertness: Awake/alert Behavior During Therapy: WFL for tasks assessed/performed Overall Cognitive Status: No family/caregiver present to determine baseline cognitive functioning Area  of Impairment: Orientation;Problem solving;Following commands                 Orientation Level: Disoriented to;Time;Situation (not able to state month or year)     Following Commands: Follows one step commands with increased time     Problem Solving: Slow processing;Difficulty sequencing;Requires verbal cues;Requires tactile cues General Comments: Per chart hx of cognitive impairments.      General Comments General comments (skin integrity, edema, etc.): BP as follows: supine 95/62, sitting 100/70, standing 98/60.    Exercises     Assessment/Plan    PT Assessment Patient needs continued PT services  PT Problem List Decreased strength;Decreased mobility;Decreased safety awareness;Decreased coordination;Decreased knowledge of precautions;Decreased activity tolerance;Decreased balance;Decreased knowledge of use of DME;Cardiopulmonary status limiting activity;Decreased cognition       PT Treatment Interventions DME instruction;Therapeutic activities;Cognitive remediation;Gait training;Therapeutic exercise;Patient/family education;Balance training;Functional mobility training    PT Goals (Current goals can be found in the Care Plan section)  Acute Rehab PT Goals Patient Stated Goal: return home PT Goal Formulation: With patient Time For Goal Achievement: 10/07/20 Potential to Achieve Goals: Good    Frequency Min 3X/week   Barriers to discharge        Co-evaluation               AM-PAC PT "6 Clicks" Mobility  Outcome Measure Help needed turning from your back to your side while in a flat bed without using bedrails?: None Help needed moving from lying on your back to sitting on the side of a flat bed without using bedrails?: None Help needed moving to and from a bed to a chair (including a wheelchair)?: A Little Help needed standing up from a chair using your arms (e.g., wheelchair or bedside chair)?: A Little Help needed to walk in hospital room?: A  Little Help needed climbing 3-5 steps with a railing? : A Little 6 Click Score: 20    End of Session Equipment Utilized During Treatment: Gait belt Activity Tolerance: Patient tolerated treatment well Patient left: in bed;with call bell/phone within reach;with bed alarm set Nurse Communication: Mobility status PT Visit Diagnosis: Unsteadiness on feet (R26.81);Other abnormalities of gait and mobility (R26.89)    Time: 0093-8182 PT Time Calculation (min) (ACUTE ONLY): 34 min   Charges:   PT Evaluation $PT Eval Low Complexity: 1 Low PT Treatments $Therapeutic Activity: 8-22 mins        Abran Richard, PT Acute Rehab Services Pager [phone] [facility] 09/23/2020, 4:16 PM

## 2020-09-23 NOTE — Progress Notes (Signed)
This note also relates to the following rows which could not be included: BP - Cannot attach notes to unvalidated device data Pulse Rate - Cannot attach notes to unvalidated device data ECG Heart Rate - Cannot attach notes to unvalidated device data SpO2 - Cannot attach notes to unvalidated device data

## 2020-09-23 NOTE — Progress Notes (Signed)
HOSPITAL MEDICINE OVERNIGHT EVENT NOTE    Notified by nursing that patient is exhibiting recurrent bouts of hypotension, current blood pressure 80/57.  Patient is awake and alert with no change in mentation.  Patient is urinating with urine appearing clear.  Lactic acid last performed early in the morning on 10/7 was unremarkable.  We will administer 500 cc lactated Ringer bolus followed by continuous infusion of 150 cc an hour.  I have advised nursing to notify me if patient's maps drop below 65 or if patient becomes otherwise symptomatic.  Michelle Emerald  MD [facility]

## 2020-09-24 ENCOUNTER — Other Ambulatory Visit: Payer: Self-pay

## 2020-09-24 DIAGNOSIS — U071 COVID-19: Secondary | ICD-10-CM | POA: Diagnosis not present

## 2020-09-24 LAB — BASIC METABOLIC PANEL
Anion gap: 9 (ref 5–15)
BUN: 8 mg/dL (ref 8–23)
CO2: 25 mmol/L (ref 22–32)
Calcium: 7.7 mg/dL — ABNORMAL LOW (ref 8.9–10.3)
Chloride: 104 mmol/L (ref 98–111)
Creatinine, Ser: 1.05 mg/dL — ABNORMAL HIGH (ref 0.44–1.00)
GFR, Estimated: 54 mL/min — ABNORMAL LOW (ref >60)
Glucose, Bld: 75 mg/dL (ref 70–99)
Potassium: 4.2 mmol/L (ref 3.5–5.1)
Sodium: 138 mmol/L (ref 135–145)

## 2020-09-24 LAB — CBC
HCT: 32.6 % — ABNORMAL LOW (ref 36.0–46.0)
Hemoglobin: 11 g/dL — ABNORMAL LOW (ref 12.0–15.0)
MCH: 30.7 pg (ref 26.0–34.0)
MCHC: 33.7 g/dL (ref 30.0–36.0)
MCV: 91.1 fL (ref 80.0–100.0)
Platelets: 120 10*3/uL — ABNORMAL LOW (ref 150–400)
RBC: 3.58 MIL/uL — ABNORMAL LOW (ref 3.87–5.11)
RDW: 13.5 % (ref 11.5–15.5)
WBC: 3.4 10*3/uL — ABNORMAL LOW (ref 4.0–10.5)
nRBC: 0 % (ref 0.0–0.2)

## 2020-09-24 LAB — PROCALCITONIN: Procalcitonin: <0.1 ng/mL

## 2020-09-24 LAB — MAGNESIUM: Magnesium: 1.4 mg/dL — ABNORMAL LOW (ref 1.7–2.4)

## 2020-09-24 MED ORDER — MAGNESIUM SULFATE 2 GM/50ML IV SOLN
2.0000 g | Freq: Once | INTRAVENOUS | Status: AC
  Administered 2020-09-24: 2 g via INTRAVENOUS
  Filled 2020-09-24: qty 50

## 2020-09-24 MED ORDER — LACTATED RINGERS IV SOLN: INTRAVENOUS | Status: AC

## 2020-09-24 NOTE — Progress Notes (Addendum)
Occupational Therapy Evaluation Patient Details Name: STEFFANY SCHOENFELDER MRN: [id number] DOB: 12/18/1950 Today's Date: 09/24/2020    History of Present Illness Pt is 69 yo female with PMH of tetralogy of Fallot s/p repair in 1958, mitral regurgitation, kidney disease, anxiety, arthritis, questionable hx of cognitive impairment. and osteoporosis.  Pt presented with lightheadedness and found to be hypotensive.  Additionally, pt found to have COVID 19.   Clinical Impression   PTA, pt lived at home with her significant other, was independent with ADL and mobility. Per chart pt with baseline cognitive deficits, however unsure of level of deficits. Pt able to ambulate and complete ADL tasks on RA with SpO2 remaining in the 90s without noted SOB or increased WOB. Pt required frequent redirection throughout session due to poor attention and distractibility. Assessed with Short Blessed Cognitive Test, receiving a score of 24/28. A score of 7 or greater would indicate the need for further evaluation to rule out a dementing disorder. Given pt's level fo cognitive impairment, recommend follow up with HHOT to maximize pt's level of independence with ADL and IADL tasks and further assess safety within the home as pt's significant other usually works during the day, however is home now because he also has Covid. Recommend pt refrain from driving. Will follow acutely to facilitate safe DC home.     Follow Up Recommendations  Supervision/Assistance - 24 hour;Home health OT    Equipment Recommendations  3 in 1 bedside commode (to use as shower seat)    Recommendations for Other Services       Precautions / Restrictions Precautions Precautions: Fall      Mobility Bed Mobility Overal bed mobility: Modified Independent                Transfers Overall transfer level: Needs assistance   Transfers: Sit to/from Stand;Stand Pivot Transfers   Stand pivot transfers: Supervision            Balance  Overall balance assessment: Needs assistance   Sitting balance-Leahy Scale: Good       Standing balance-Leahy Scale: Fair                             ADL either performed or assessed with clinical judgement   ADL Overall ADL's : Needs assistance/impaired                                     Functional mobility during ADLs: Supervision/safety General ADL Comments: Requires overall set up/S with ADL tasks. Difficulty sustaining attention to ADL task. Washed face for @ 7 min.Recommend use of shower chair for bathing.  Pt is responsible for her medication mangement. "Sometimes I make mistakes". "I don't like to cook anymore". Pt driving to get groceries/food.      Vision         Perception     Praxis      Pertinent Vitals/Pain Pain Assessment: No/denies pain     Hand Dominance Right   Extremity/Trunk Assessment Upper Extremity Assessment Upper Extremity Assessment: Generalized weakness   Lower Extremity Assessment Lower Extremity Assessment: Defer to PT evaluation   Cervical / Trunk Assessment Cervical / Trunk Assessment: Normal   Communication Communication Communication: No difficulties   Cognition Arousal/Alertness: Awake/alert Behavior During Therapy: WFL for tasks assessed/performed Overall Cognitive Status: No family/caregiver present to determine baseline cognitive functioning Area of Impairment:  Orientation;Problem solving;Following commands;Attention;Memory;Safety/judgement;Awareness                 Orientation Level: Disoriented to;Time;Situation (not able to state month or year; thinks it is 2022) Current Attention Level: Sustained (unable to talk and walk do talk and do anything else) Memory: Decreased short-term memory Following Commands: Follows one step commands with increased time Safety/Judgement: Decreased awareness of safety;Decreased awareness of deficits Awareness: Emergent Problem Solving: Slow  processing;Difficulty sequencing;Requires verbal cues;Requires tactile cues General Comments: Per chart hx of cognitive impairments however unsure of baseline. Given the Short Bessed test - received a score of 24/28.    General Comments   Reenterred room with supplies for pt. Pt looking in her purse and saying "make the beeping stop , make it stop!" The IV was beeping and she was looking for it in her purse    Exercises     Shoulder Instructions      Home Living Family/patient expects to be discharged to:: Private residence Living Arrangements: Other (Comment) Available Help at Discharge: Available 24 hours/day;Friend(s) Type of Home: House Home Access: Other (comment)     Home Layout: One level     Bathroom Shower/Tub: Teacher, early years/pre: Standard Bathroom Accessibility:  (doesn't know)   Home Equipment: Crutches;Cane - single point;Bedside commode;Grab bars - tub/shower          Prior Functioning/Environment Level of Independence: Independent        Comments: independent with ADLs, IADLs, driving, and community ambulation        OT Problem List: Decreased activity tolerance;Decreased strength;Impaired balance (sitting and/or standing);Decreased cognition;Decreased safety awareness      OT Treatment/Interventions: Self-care/ADL training;Therapeutic exercise;Energy conservation;DME and/or AE instruction;Therapeutic activities;Cognitive remediation/compensation;Patient/family education;Balance training    OT Goals(Current goals can be found in the care plan section) Acute Rehab OT Goals Patient Stated Goal: return home OT Goal Formulation: With patient Time For Goal Achievement: 10/08/20 Potential to Achieve Goals: Good  OT Frequency: Min 2X/week   Barriers to D/C:            Co-evaluation              AM-PAC OT "6 Clicks" Daily Activity     Outcome Measure Help from another person eating meals?: None Help from another person taking  care of personal grooming?: A Little Help from another person toileting, which includes using toliet, bedpan, or urinal?: A Little Help from another person bathing (including washing, rinsing, drying)?: A Little Help from another person to put on and taking off regular upper body clothing?: A Little Help from another person to put on and taking off regular lower body clothing?: A Little 6 Click Score: 19   End of Session Equipment Utilized During Treatment: Gait belt Nurse Communication: Mobility status;Other (comment) (SpO2 )  Activity Tolerance: Patient tolerated treatment well Patient left: in chair;with call bell/phone within reach;with chair alarm set  OT Visit Diagnosis: Unsteadiness on feet (R26.81);Muscle weakness (generalized) (M62.81);Other symptoms and signs involving cognitive function                Time: 1228-1310 OT Time Calculation (min): 42 min Charges:  OT General Charges $OT Visit: 1 Visit OT Evaluation $OT Eval Moderate Complexity: 1 Mod OT Treatments $Self Care/Home Management : 23-37 mins  Maurie Boettcher, OT/L   Acute OT Clinical Specialist Acute Rehabilitation Services Pager [phone] Office [phone]   Joyce Eisenberg [facility] 09/24/2020, 2:39 PM

## 2020-09-24 NOTE — Progress Notes (Signed)
[facility] PROGRESS NOTE   Michelle [name] NOB:[id number] DOB: Feb 21, 1951 DOA: 09/22/2020  PCP: [name], MD  Brief History/Interval Summary: 69 year old Caucasian female with a past medical history of tetralogy of Fallot status post repair in 1958, mitral regurgitation, essential hypertension, hyperlipidemia, hypothyroidism, chronic kidney disease stage IIIb questionable history of cognitive impairment.  Patient's boyfriend apparently tested positive for Covid about 2 weeks ago.  She presented to the hospital with complains of feeling lightheaded.  Apparently was noted to be quite hypotensive.  Blood pressure in the ED was 72/44.  She was given fluid boluses without much improvement.  She was started on Neo-Synephrine.  Subsequently admitted to the ICU.  She also tested positive for COVID-19 although did not have any respiratory symptoms.  She was stabilized and then transferred to the floor.  Reason for Visit: Hypotension likely due to hypovolemia.  COVID-19 illness.  Consultants: Seen by critical care medicine.    Procedures: None  Antibiotics: Anti-infectives (From admission, onward)   Start     Dose/Rate Route Frequency Ordered Stop   09/22/20 2200  vancomycin (VANCOREADY) IVPB 500 mg/100 mL  Status:  Discontinued        500 mg 100 mL/hr over 60 Minutes Intravenous Every 24 hours 09/22/20 0503 09/22/20 0543   09/22/20 1000  aztreonam (AZACTAM) 1 g in sodium chloride 0.9 % 100 mL IVPB  Status:  Discontinued        1 g 200 mL/hr over 30 Minutes Intravenous Every 8 hours 09/22/20 0503 09/22/20 0543   09/22/20 0130  aztreonam (AZACTAM) 2 g in sodium chloride 0.9 % 100 mL IVPB        2 g 200 mL/hr over 30 Minutes Intravenous  Once 09/22/20 0129 09/22/20 0246   09/22/20 0130  metroNIDAZOLE (FLAGYL) IVPB 500 mg        500 mg 100 mL/hr over 60 Minutes Intravenous  Once 09/22/20 0129 09/22/20 0315   09/22/20 0130  vancomycin (VANCOCIN) IVPB 1000 mg/200 mL premix         1,000 mg 200 mL/hr over 60 Minutes Intravenous  Once 09/22/20 0129 09/22/20 0316      Subjective/Interval History: Patient in bed, appears comfortable, denies any headache, no fever, no chest pain or pressure, no shortness of breath , no abdominal pain. No focal weakness. Chronic itching.   Assessment/Plan:  Hypotension, dehydration with AKI. Thought to be secondary to hypovolemia and dehydration.  Signs of sepsis, cultures negative, improving after IV fluids for hydration which will be continued on 09/24/2020.  Seen by PT OT, home walker ordered.  COVID-19 illness Found to be positive for COVID-19.  Remains on room air saturating normal.  Chest x-ray did not show any acute findings.  Looks like she primarily has GI symptomatology.  Due to her comorbidities patient was considered for monoclonal antibody infusion.  She was given bamlanivimab and etesevimab on 10/7.  Clinically stable, if continues to be symptom-free from Covid standpoint likely discharge home on 09/25/2020.  Recent Labs  Lab 09/22/20 0100 09/22/20 0541 09/23/20 0455 09/24/20 0404  WBC 6.5 6.1 3.4* 3.4*  HGB 12.9 10.9* 10.3* 11.0*  HCT 39.4 33.0* 31.4* 32.6*  PLT 156 156 122* 120*  MCV 92.9 90.9 89.7 91.1  MCH 30.4 30.0 29.4 30.7  MCHC 32.7 33.0 32.8 33.7  RDW 13.4 13.2 13.4 13.5  LYMPHSABS 0.7  --   --   --   MONOABS 0.4  --   --   --  EOSABS 0.0  --   --   --   BASOSABS 0.0  --   --   --     Recent Labs  Lab 09/22/20 0100 09/22/20 0129 09/22/20 0230 09/22/20 0324 09/22/20 0541 09/23/20 0455 09/24/20 0404 09/24/20 0838  NA 135  --  136  --  136 137 138  --   K 3.7  --  3.7  --  3.6 4.0 4.2  --   CL 102  --  105  --  106 104 104  --   CO2 21*  --  19*  --  22 26 25   --   GLUCOSE 134*  --  105*  --  89 72 75  --   BUN 39*  --  35*  --  29* 13 8  --   CREATININE 2.09*  --  1.77*  --  1.56* 0.98 1.05*  --   CALCIUM 7.7*  --  7.1*  --  7.5* 7.5* 7.7*  --   AST  --   --  20  --  22  --   --   --   ALT   --   --  11  --  12  --   --   --   ALKPHOS  --   --  33*  --  31*  --   --   --   BILITOT  --   --  0.6  --  0.6  --   --   --   ALBUMIN  --   --  2.6*  --  2.6*  --   --   --   MG  --   --   --   --  1.5*  --  1.4*  --   PROCALCITON  --   --   --   --  <0.10 <0.10  --  <0.10  LATICACIDVEN  --   --  1.4 1.2  --   --   --   --   INR  --  1.3*  --   --   --   --   --   --   TSH  --   --  1.420  --   --   --   --   --     Recent Labs  Lab 09/22/20 0100 09/22/20 0129 09/22/20 0144 09/22/20 0230 09/22/20 0324 09/22/20 0541 09/23/20 0455 09/24/20 0404 09/24/20 0838  WBC 6.5  --   --   --   --  6.1 3.4* 3.4*  --   PROCALCITON  --   --   --   --   --  <0.10 <0.10  --  <0.10  LATICACIDVEN  --   --   --  1.4 1.2  --   --   --   --   AST  --   --   --  20  --  22  --   --   --   ALT  --   --   --  11  --  12  --   --   --   ALKPHOS  --   --   --  33*  --  31*  --   --   --   BILITOT  --   --   --  0.6  --  0.6  --   --   --   ALBUMIN  --   --   --  2.6*  --  2.6*  --   --   --   INR  --  1.3*  --   --   --   --   --   --   --   SARSCOV2NAA  --   --  POSITIVE*  --   --   --   --   --   --         Acute kidney injury on chronic kidney disease stage IIIb/hypomagnesemia Renal function has improved.  Creatinine is down to 0.98.  Continue IV hydration.  Monitor urine output.  She was given magnesium sulfate yesterday.  We will recheck labs tomorrow.  Thrombocytopenia Drop in platelet counts noted this morning.  Recheck tomorrow.  Patient noted to be on subcutaneous heparin.  History of essential hypertension Antihypertensives on hold.  See above.  Hyperlipidemia Continue simvastatin.  History of tetralogy of Fallot status post repair in 1958/history of mitral regurgitation Patient does have systolic murmur on examination.  Otherwise cardiac status seems to be stable.  Questionable history of cognitive impairment Patient noted to be on donepezil at  home.  Hypothyroidism Continue levothyroxine.  Normocytic anemia Hemoglobin is stable.  No evidence of overt bleeding.  Hypomagnesemia.  Replaced.    DVT Prophylaxis: Subcutaneous heparin Code Status: Full code Family Communication: Discussed with the patient. Disposition Plan: PT and OT evaluation.  Hopefully return home when improved  Status is: Inpatient  Remains inpatient appropriate because:IV treatments appropriate due to intensity of illness or inability to take PO and Inpatient level of care appropriate due to severity of illness   Dispo: The patient is from: Home              Anticipated d/c is to: Home              Anticipated d/c date is: 2 days              Patient currently is not medically stable to d/c.      Medications:  Scheduled: . aspirin  81 mg Oral Daily  . Chlorhexidine Gluconate Cloth  6 each Topical Daily  . heparin  5,000 Units Subcutaneous Q8H  . levothyroxine  50 mcg Oral Q0600  . pantoprazole  40 mg Oral Daily  . sertraline  100 mg Oral Daily  . simvastatin  10 mg Oral q1800   Continuous: . sodium chloride    . lactated ringers 75 mL/hr at 09/24/20 0935  . magnesium sulfate bolus IVPB 2 g (09/24/20 0939)   OFB:PZWCHENID, docusate sodium, polyethylene glycol   Objective:  Vital Signs  Vitals:   09/23/20 0421 09/23/20 1433 09/23/20 2205 09/24/20 0515  BP: (!) 107/50 (!) 96/20 95/63 102/79  Pulse: 72 72 64 60  Resp: 18 18 20 15   Temp: 97.9 F (36.6 C) 98 F (36.7 C) 98.4 F (36.9 C) 97.9 F (36.6 C)  TempSrc: Axillary Oral Oral Oral  SpO2: 96% 97% 97% 95%  Weight:      Height:        Intake/Output Summary (Last 24 hours) at 09/24/2020 1038 Last data filed at 09/24/2020 0944 Gross per 24 hour  Intake 180 ml  Output --  Net 180 ml   Filed Weights   09/22/20 0046  Weight: 44.5 kg    Exam  Awake Alert, No new F.N deficits, anxious affect [place].AT,PERRAL Supple Neck,No JVD, No cervical lymphadenopathy appriciated.   Symmetrical Chest wall movement, Good air movement bilaterally, CTAB RRR,No Gallops, Rubs or  new Murmurs, No Parasternal Heave +ve B.Sounds, Abd Soft, No tenderness, No organomegaly appriciated, No rebound - guarding or rigidity. No Cyanosis, Clubbing or edema, No new Rash or bruise    Lab Results:  Data Reviewed: I have personally reviewed following labs and imaging studies  CBC: Recent Labs  Lab 09/22/20 0100 09/22/20 0541 09/23/20 0455 09/24/20 0404  WBC 6.5 6.1 3.4* 3.4*  NEUTROABS 5.3  --   --   --   HGB 12.9 10.9* 10.3* 11.0*  HCT 39.4 33.0* 31.4* 32.6*  MCV 92.9 90.9 89.7 91.1  PLT 156 156 122* 120*    Basic Metabolic Panel: Recent Labs  Lab 09/22/20 0100 09/22/20 0230 09/22/20 0541 09/23/20 0455 09/24/20 0404  NA 135 136 136 137 138  K 3.7 3.7 3.6 4.0 4.2  CL 102 105 106 104 104  CO2 21* 19* 22 26 25   GLUCOSE 134* 105* 89 72 75  BUN 39* 35* 29* 13 8  CREATININE 2.09* 1.77* 1.56* 0.98 1.05*  CALCIUM 7.7* 7.1* 7.5* 7.5* 7.7*  MG  --   --  1.5*  --  1.4*  PHOS  --   --  3.5  --   --     GFR: Estimated Creatinine Clearance: 35 mL/min (A) (by C-G formula based on SCr of 1.05 mg/dL (H)).  Liver Function Tests: Recent Labs  Lab 09/22/20 0230 09/22/20 0541  AST 20 22  ALT 11 12  ALKPHOS 33* 31*  BILITOT 0.6 0.6  PROT 4.6* 4.6*  ALBUMIN 2.6* 2.6*    Coagulation Profile: Recent Labs  Lab 09/22/20 0129  INR 1.3*    CBG: Recent Labs  Lab 09/22/20 0120  GLUCAP 103*     Thyroid Function Tests: Recent Labs    09/22/20 0230  TSH 1.420      Recent Results (from the past 240 hour(s))  Blood Culture (routine x 2)     Status: None (Preliminary result)   Collection Time: 09/22/20  1:29 AM   Specimen: BLOOD  Result Value Ref Range Status   Specimen Description BLOOD RIGHT ARM  Final   Special Requests   Final    BOTTLES DRAWN AEROBIC AND ANAEROBIC Blood Culture results may not be optimal due to an inadequate volume of blood received in  culture bottles   Culture   Final    NO GROWTH 2 DAYS Performed at [facility], [place] [address], [place], [place] [address]    Report Status PENDING  Incomplete  Respiratory Panel by RT PCR (Flu A&B, Covid) - Nasopharyngeal Swab     Status: Abnormal   Collection Time: 09/22/20  1:44 AM   Specimen: Nasopharyngeal Swab  Result Value Ref Range Status   SARS Coronavirus 2 by RT PCR POSITIVE (A) NEGATIVE Final    Comment: RESULT CALLED TO, READ BACK BY AND VERIFIED WITH: S WIRTZ RN 09/22/20 0409 JDW (NOTE) SARS-CoV-2 target nucleic acids are DETECTED.  SARS-CoV-2 RNA is generally detectable in upper respiratory specimens  during the acute phase of infection. Positive results are indicative of the presence of the identified virus, but do not rule out bacterial infection or co-infection with other pathogens not detected by the test. Clinical correlation with patient history and other diagnostic information is necessary to determine patient infection status. The expected result is Negative.  Fact Sheet for Patients:  PinkCheek.be  Fact Sheet for Healthcare Providers: GravelBags.it  This test is not yet approved or cleared by the [place] FDA and  has  been authorized for detection and/or diagnosis of SARS-CoV-2 by FDA under an Emergency Use Authorization (EUA).  This EUA will remain in effect (meaning this test can be used) f or the duration of  the COVID-19 declaration under Section 564(b)(1) of the Act, 21 U.S.C. section 360bbb-3(b)(1), unless the authorization is terminated or revoked sooner.      Influenza A by PCR NEGATIVE NEGATIVE Final   Influenza B by PCR NEGATIVE NEGATIVE Final    Comment: (NOTE) The Xpert Xpress SARS-CoV-2/FLU/RSV assay is intended as an aid in  the diagnosis of influenza from Nasopharyngeal swab specimens and  should not be used as a sole basis for treatment. Nasal washings and   aspirates are unacceptable for Xpert Xpress SARS-CoV-2/FLU/RSV  testing.  Fact Sheet for Patients: PinkCheek.be  Fact Sheet for Healthcare Providers: GravelBags.it  This test is not yet approved or cleared by the [place] FDA and  has been authorized for detection and/or diagnosis of SARS-CoV-2 by  FDA under an Emergency Use Authorization (EUA). This EUA will remain  in effect (meaning this test can be used) for the duration of the  Covid-19 declaration under Section 564(b)(1) of the Act, 21  U.S.C. section 360bbb-3(b)(1), unless the authorization is  terminated or revoked. Performed at [facility], [place] [address], [place], [place] [address]   Urine culture     Status: Abnormal   Collection Time: 09/22/20  2:59 AM   Specimen: In/Out Cath Urine  Result Value Ref Range Status   Specimen Description IN/OUT CATH URINE  Final   Special Requests   Final    NONE Performed at [facility], [place] [address] [place], [place] [address]    Culture MULTIPLE SPECIES PRESENT, SUGGEST RECOLLECTION (A)  Final   Report Status 09/22/2020 FINAL  Final  MRSA PCR Screening     Status: None   Collection Time: 09/22/20  6:42 AM   Specimen: Nasopharyngeal  Result Value Ref Range Status   MRSA by PCR NEGATIVE NEGATIVE Final    Comment:        The GeneXpert MRSA Assay (FDA approved for NASAL specimens only), is one component of a comprehensive MRSA colonization surveillance program. It is not intended to diagnose MRSA infection nor to guide or monitor treatment for MRSA infections. Performed at [facility], [place] [address], [place], [place] [address]   Blood Culture (routine x 2)     Status: None (Preliminary result)   Collection Time: 09/22/20  6:59 AM   Specimen: BLOOD  Result Value Ref Range Status   Specimen Description BLOOD RIGHT ANTECUBITAL  Final   Special Requests   Final    BOTTLES DRAWN AEROBIC AND  ANAEROBIC Blood Culture adequate volume   Culture   Final    NO GROWTH 2 DAYS Performed at [facility], [place] [address] [place], [place] [address]    Report Status PENDING  Incomplete      Radiology Studies: No results found.     LOS: 2 days   [facility] Pager on [email or web address]  09/24/2020, 10:38 AM

## 2020-09-24 NOTE — Progress Notes (Signed)
SATURATION QUALIFICATIONS: (This note is used to comply with regulatory documentation for home oxygen)  Patient Saturations on Room Air at Rest = 98%  Patient Saturations on Room Air while Ambulating = 95%  Patient Saturations on 0 Liters of oxygen while Ambulating = 0%  Please briefly explain why patient needs home oxygen:

## 2020-09-25 ENCOUNTER — Emergency Department (HOSPITAL_COMMUNITY): Payer: Medicare HMO

## 2020-09-25 ENCOUNTER — Other Ambulatory Visit: Payer: Self-pay

## 2020-09-25 ENCOUNTER — Encounter (HOSPITAL_COMMUNITY): Payer: Self-pay

## 2020-09-25 ENCOUNTER — Emergency Department (HOSPITAL_COMMUNITY)
Admission: EM | Admit: 2020-09-25 | Discharge: 2020-09-26 | Disposition: A | Discharge status: Home / Self Care | Payer: Medicare HMO | Attending: Emergency Medicine | Admitting: Emergency Medicine

## 2020-09-25 DIAGNOSIS — S3992XA Unspecified injury of lower back, initial encounter: Secondary | ICD-10-CM | POA: Diagnosis not present

## 2020-09-25 DIAGNOSIS — Z79899 Other long term (current) drug therapy: Secondary | ICD-10-CM | POA: Diagnosis not present

## 2020-09-25 DIAGNOSIS — I9589 Other hypotension: Secondary | ICD-10-CM | POA: Diagnosis not present

## 2020-09-25 DIAGNOSIS — I10 Essential (primary) hypertension: Secondary | ICD-10-CM | POA: Insufficient documentation

## 2020-09-25 DIAGNOSIS — S0101XA Laceration without foreign body of scalp, initial encounter: Secondary | ICD-10-CM | POA: Diagnosis not present

## 2020-09-25 DIAGNOSIS — Z7982 Long term (current) use of aspirin: Secondary | ICD-10-CM | POA: Diagnosis not present

## 2020-09-25 DIAGNOSIS — Z8616 Personal history of COVID-19: Secondary | ICD-10-CM

## 2020-09-25 DIAGNOSIS — J321 Chronic frontal sinusitis: Secondary | ICD-10-CM | POA: Diagnosis not present

## 2020-09-25 DIAGNOSIS — Z87891 Personal history of nicotine dependence: Secondary | ICD-10-CM | POA: Diagnosis not present

## 2020-09-25 DIAGNOSIS — S199XXA Unspecified injury of neck, initial encounter: Secondary | ICD-10-CM | POA: Diagnosis not present

## 2020-09-25 DIAGNOSIS — E861 Hypovolemia: Secondary | ICD-10-CM | POA: Diagnosis not present

## 2020-09-25 DIAGNOSIS — R52 Pain, unspecified: Secondary | ICD-10-CM

## 2020-09-25 DIAGNOSIS — M545 Low back pain, unspecified: Secondary | ICD-10-CM | POA: Diagnosis not present

## 2020-09-25 DIAGNOSIS — W19XXXA Unspecified fall, initial encounter: Secondary | ICD-10-CM

## 2020-09-25 DIAGNOSIS — M47816 Spondylosis without myelopathy or radiculopathy, lumbar region: Secondary | ICD-10-CM | POA: Diagnosis not present

## 2020-09-25 DIAGNOSIS — S0083XA Contusion of other part of head, initial encounter: Secondary | ICD-10-CM | POA: Diagnosis not present

## 2020-09-25 DIAGNOSIS — M47817 Spondylosis without myelopathy or radiculopathy, lumbosacral region: Secondary | ICD-10-CM | POA: Diagnosis not present

## 2020-09-25 DIAGNOSIS — I6529 Occlusion and stenosis of unspecified carotid artery: Secondary | ICD-10-CM | POA: Diagnosis not present

## 2020-09-25 DIAGNOSIS — M4316 Spondylolisthesis, lumbar region: Secondary | ICD-10-CM | POA: Diagnosis not present

## 2020-09-25 DIAGNOSIS — S0990XA Unspecified injury of head, initial encounter: Secondary | ICD-10-CM

## 2020-09-25 DIAGNOSIS — I672 Cerebral atherosclerosis: Secondary | ICD-10-CM | POA: Diagnosis not present

## 2020-09-25 DIAGNOSIS — W010XXA Fall on same level from slipping, tripping and stumbling without subsequent striking against object, initial encounter: Secondary | ICD-10-CM | POA: Insufficient documentation

## 2020-09-25 DIAGNOSIS — M47812 Spondylosis without myelopathy or radiculopathy, cervical region: Secondary | ICD-10-CM | POA: Diagnosis not present

## 2020-09-25 DIAGNOSIS — S39012A Strain of muscle, fascia and tendon of lower back, initial encounter: Secondary | ICD-10-CM | POA: Diagnosis not present

## 2020-09-25 DIAGNOSIS — U071 COVID-19: Secondary | ICD-10-CM | POA: Diagnosis not present

## 2020-09-25 DIAGNOSIS — S2241XA Multiple fractures of ribs, right side, initial encounter for closed fracture: Secondary | ICD-10-CM | POA: Diagnosis not present

## 2020-09-25 LAB — PROCALCITONIN: Procalcitonin: <0.1 ng/mL

## 2020-09-25 LAB — CBC WITH DIFFERENTIAL/PLATELET
Abs Immature Granulocytes: 0 10*3/uL (ref 0.00–0.07)
Basophils Absolute: 0 10*3/uL (ref 0.0–0.1)
Basophils Relative: 0 %
Eosinophils Absolute: 0.2 10*3/uL (ref 0.0–0.5)
Eosinophils Relative: 6 %
HCT: 32.8 % — ABNORMAL LOW (ref 36.0–46.0)
Hemoglobin: 10.7 g/dL — ABNORMAL LOW (ref 12.0–15.0)
Immature Granulocytes: 0 %
Lymphocytes Relative: 51 %
Lymphs Abs: 1.5 10*3/uL (ref 0.7–4.0)
MCH: 29.5 pg (ref 26.0–34.0)
MCHC: 32.6 g/dL (ref 30.0–36.0)
MCV: 90.4 fL (ref 80.0–100.0)
Monocytes Absolute: 0.2 10*3/uL (ref 0.1–1.0)
Monocytes Relative: 8 %
Neutro Abs: 1.1 10*3/uL — ABNORMAL LOW (ref 1.7–7.7)
Neutrophils Relative %: 35 %
Platelets: 131 10*3/uL — ABNORMAL LOW (ref 150–400)
RBC: 3.63 MIL/uL — ABNORMAL LOW (ref 3.87–5.11)
RDW: 13.4 % (ref 11.5–15.5)
WBC: 3 10*3/uL — ABNORMAL LOW (ref 4.0–10.5)
nRBC: 0 % (ref 0.0–0.2)

## 2020-09-25 LAB — COMPREHENSIVE METABOLIC PANEL
ALT: 14 U/L (ref 0–44)
AST: 25 U/L (ref 15–41)
Albumin: 2.4 g/dL — ABNORMAL LOW (ref 3.5–5.0)
Alkaline Phosphatase: 26 U/L — ABNORMAL LOW (ref 38–126)
Anion gap: 8 (ref 5–15)
BUN: <5 mg/dL — ABNORMAL LOW (ref 8–23)
CO2: 27 mmol/L (ref 22–32)
Calcium: 7.8 mg/dL — ABNORMAL LOW (ref 8.9–10.3)
Chloride: 103 mmol/L (ref 98–111)
Creatinine, Ser: 0.83 mg/dL (ref 0.44–1.00)
GFR, Estimated: >60 mL/min (ref >60)
Glucose, Bld: 69 mg/dL — ABNORMAL LOW (ref 70–99)
Potassium: 3.8 mmol/L (ref 3.5–5.1)
Sodium: 138 mmol/L (ref 135–145)
Total Bilirubin: 0.1 mg/dL — ABNORMAL LOW (ref 0.3–1.2)
Total Protein: 4.6 g/dL — ABNORMAL LOW (ref 6.5–8.1)

## 2020-09-25 LAB — MAGNESIUM: Magnesium: 1.7 mg/dL (ref 1.7–2.4)

## 2020-09-25 LAB — BRAIN NATRIURETIC PEPTIDE: B Natriuretic Peptide: 716.1 pg/mL — ABNORMAL HIGH (ref 0.0–100.0)

## 2020-09-25 LAB — D-DIMER, QUANTITATIVE: D-Dimer, Quant: 0.5 ug/mL-FEU (ref 0.00–0.50)

## 2020-09-25 LAB — C-REACTIVE PROTEIN: CRP: 0.6 mg/dL (ref <1.0)

## 2020-09-25 MED ORDER — ALBUTEROL SULFATE HFA 108 (90 BASE) MCG/ACT IN AERS
2.0000 | INHALATION_SPRAY | Freq: Four times a day (QID) | RESPIRATORY_TRACT | 0 refills | Status: DC | PRN
Start: 2020-09-25 — End: 2021-07-18

## 2020-09-25 NOTE — TOC Transition Note (Signed)
Transition of Care [facility]) - CM/SW Discharge Note   Patient Details  Name: MONISHA SIEBEL MRN: [id number] Date of Birth: October 13, 1951  [facility]) CM/SW Contact:  Carles Collet, RN Phone Number: 09/25/2020, 8:52 AM   Clinical Narrative:    Spoke w patient, she confirms plans to DC to home when ready. Verified demographics. She will has support from significant other "Fredrich Birks."  Antionette Char [phone]   She states that she has a RW in her barn that is accessible.  She would like 3/1. Discussed HH, she would like [facility], has had them in the past. Referral made to [facility] for 3/1, and [facility] for [facility].     Final next level of care: [place] Barriers to Discharge: No Barriers Identified   Patient Goals and CMS Choice Patient states their goals for this hospitalization and ongoing recovery are:: to go home CMS Medicare.gov Compare Post Acute Care list provided to:: Patient Choice offered to / list presented to : Patient  Discharge Placement                       Discharge Plan and Services                DME Arranged: 3-N-1 DME Agency: [facility] Date DME Agency Contacted: 09/25/20 Time DME Agency Contacted: [phone] Representative spoke with at DME Agency: [facility] HH Arranged: PT, OT, Social Work [facility] Agency: [place] Date [place]: 09/25/20 Time [place]: [phone] Representative spoke with at [place]: [place] ([place]) Interventions     Readmission Risk Interventions No flowsheet data found.
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
## 2020-09-25 NOTE — Discharge Summary (Signed)
Michelle [name] KKX:[id number] DOB: [birth year] DOA: 09/22/2020  PCP: [name], MD  Admit date: 09/22/2020  Discharge date: 09/25/2020  Admitted From: Home  Disposition:  Home   Recommendations for Outpatient Follow-up:   Follow up with PCP in 1-2 weeks  PCP Please obtain BMP/CBC, 2 view CXR in 1week,  (see Discharge instructions)   PCP Please follow up on the following pending results:    Home Health: PT,RN   Equipment/Devices: Walker  Consultations: None  Discharge Condition: Stable    CODE STATUS: Full    Diet Recommendation: Heart Healthy   Diet Order            Diet - low sodium heart healthy           Diet Heart Room service appropriate? Yes; Fluid consistency: Thin  Diet effective now                  CC - Weakness   Brief history of present illness from the day of admission and additional interim summary    69 year old Caucasian female with a past medical history of tetralogy of Fallot status post repair in 1958, mitral regurgitation, essential hypertension, hyperlipidemia, hypothyroidism, chronic kidney disease stage IIIb questionable history of cognitive impairment.  Patient's boyfriend apparently tested positive for Covid about 2 weeks ago.  She presented to the hospital with complains of feeling lightheaded.  Apparently was noted to be quite hypotensive.  Blood pressure in the ED was 72/44.  She was given fluid boluses without much improvement.  She was started on Neo-Synephrine.  Subsequently admitted to the ICU.  She also tested positive for COVID-19 although did not have any respiratory symptoms.  She was stabilized and then transferred to the floor.                                                                 Hospital Course   Hypotension, dehydration with AKI. Thought to be  secondary to hypovolemia and dehydration.  Signs of sepsis, cultures negative, improving after IV fluids for hydration which will be continued on 09/24/2020.  Seen by PT OT, home walker ordered.  Her blood pressure and orthostatic vital signs are stable now, she is symptom-free.  Request PCP to monitor her blood pressure and diuretic use closely.  Note I am discontinuing her home blood pressure medication and diuretic, PCP to monitor closely.  COVID-19 illness Found to be positive for COVID-19.  Remains on room air saturating normal.  Chest x-ray did not show any acute findings.  Looks like she primarily has GI symptomatology.  Due to her comorbidities patient was considered for monoclonal antibody infusion.  She was given bamlanivimab and etesevimab on 10/7.  Clinically stable, if continues to be symptom-free from Covid standpoint likely discharge home on 09/25/2020.  SpO2: 95 %  Recent Labs  Lab 09/22/20 0100 09/22/20 0129 09/22/20 0144 09/22/20 0230 09/22/20 0324 09/22/20 0541 09/23/20 0455 09/24/20 0404 09/24/20 0838 09/25/20 0500  WBC 6.5  --   --   --   --  6.1 3.4* 3.4*  --  3.0*  CRP  --   --   --   --   --   --   --   --   --  0.6  DDIMER  --   --   --   --   --   --   --   --   --  0.50  BNP  --   --   --   --   --   --   --   --   --  716.1*  PROCALCITON  --   --   --   --   --  <0.10 <0.10  --  <0.10 <0.10  LATICACIDVEN  --   --   --  1.4 1.2  --   --   --   --   --   AST  --   --   --  20  --  22  --   --   --  25  ALT  --   --   --  11  --  12  --   --   --  14  ALKPHOS  --   --   --  33*  --  31*  --   --   --  26*  BILITOT  --   --   --  0.6  --  0.6  --   --   --  0.1*  ALBUMIN  --   --   --  2.6*  --  2.6*  --   --   --  2.4*  INR  --  1.3*  --   --   --   --   --   --   --   --   SARSCOV2NAA  --   --  POSITIVE*  --   --   --   --   --   --   --       Acute kidney injury   AKI resolved after IV fluids.  Home blood pressure and diuretic discontinued for  now.  Thrombocytopenia Much improved likely due to viral illness PCP to repeat CBC and CMP in 7 to 10 days.  History of essential hypertension Antihypertensives on hold.  See above.  Hyperlipidemia Continue simvastatin.  History of tetralogy of Fallot status post repair in 1958/history of mitral regurgitation Patient does have systolic murmur on examination.  Otherwise cardiac status seems to be stable.  Questionable history of cognitive impairment Patient noted to be on donepezil at home.  Follow with PCP.  Hypothyroidism Continue levothyroxine.  Normocytic anemia Hemoglobin is stable.  No evidence of overt bleeding.  Hypomagnesemia.  Replaced and stable.    Discharge diagnosis     Active Problems:   Hypotension    Discharge instructions    Discharge Instructions    Diet - low sodium heart healthy   Complete by: As directed    Discharge instructions   Complete by: As directed    Follow with Primary MD [name], MD in 7 days   Get CBC, CMP, 2 view Chest X ray -  checked next visit within 1 week by Primary MD   Activity: As  tolerated with Full fall precautions use walker/cane & assistance as needed  Disposition Home    Diet: Heart Healthy   Special Instructions: If you have smoked or chewed Tobacco  in the last 2 yrs please stop smoking, stop any regular Alcohol  and or any Recreational drug use.  On your next visit with your primary care physician please Get Medicines reviewed and adjusted.  Please request your Prim.MD to go over all Hospital Tests and Procedure/Radiological results at the follow up, please get all Hospital records sent to your Prim MD by signing hospital release before you go home.  If you experience worsening of your admission symptoms, develop shortness of breath, life threatening emergency, suicidal or homicidal thoughts you must seek medical attention immediately by calling 911 or calling your MD immediately  if symptoms  less severe.  You Must read complete instructions/literature along with all the possible adverse reactions/side effects for all the Medicines you take and that have been prescribed to you. Take any new Medicines after you have completely understood and accpet all the possible adverse reactions/side effects.   Increase activity slowly   Complete by: As directed       Discharge Medications   Allergies as of 09/25/2020      Reactions   Aricept [donepezil] Other (See Comments)   bradycardia   Penicillins Rash   Has patient had a PCN reaction causing immediate rash, facial/tongue/throat swelling, SOB or lightheadedness with hypotension: Yes Has patient had a PCN reaction causing severe rash involving mucus membranes or skin necrosis: No Has patient had a PCN reaction that required hospitalization: No Has patient had a PCN reaction occurring within the last 10 years: No If all of the above answers are "NO", then may proceed with Cephalosporin use.      Medication List    STOP taking these medications   Edarbyclor 40-12.5 MG Tabs Generic drug: Azilsartan-Chlorthalidone     TAKE these medications   albuterol 108 (90 Base) MCG/ACT inhaler Commonly known as: VENTOLIN HFA Inhale 2 puffs into the lungs every 6 (six) hours as needed for wheezing or shortness of breath.   amitriptyline 50 MG tablet Commonly known as: ELAVIL TAKE 1 TABLET BY MOUTH EVERYDAY AT BEDTIME What changed: See the new instructions.   aspirin 81 MG chewable tablet Chew 81 mg by mouth daily.   cetirizine 10 MG tablet Commonly known as: ZYRTEC Take 1 tablet (10 mg total) by mouth 2 (two) times daily.   cholecalciferol 1000 units tablet Commonly known as: VITAMIN D Take 1,000 Units by mouth daily.   dicyclomine 10 MG capsule Commonly known as: BENTYL Take 1 capsule (10 mg total) by mouth 3 (three) times daily before meals.   donepezil 10 MG tablet Commonly known as: ARICEPT   Fish Oil 1000 MG Caps Take  2 capsules by mouth daily.   levothyroxine 50 MCG tablet Commonly known as: SYNTHROID TAKE 1 TABLET BY MOUTH EVERY DAY   sertraline 100 MG tablet Commonly known as: ZOLOFT TAKE 1 TABLET BY MOUTH EVERY DAY   simvastatin 10 MG tablet Commonly known as: ZOCOR TAKE 1 TABLET BY MOUTH EVERY DAY   triamcinolone ointment 0.1 % Commonly known as: KENALOG Apply topically 2 (two) times daily. What changed: how much to take            Durable Medical Equipment  (From admission, onward)         Start     Ordered   09/25/20 0851  For  home use only DME 3 n 1  Once        09/25/20 0851   09/24/20 0743  For home use only DME Walker rolling  Once       Comments: 5 wheel  Question Answer Comment  Walker: With 5 Inch Wheels   Patient needs a walker to treat with the following condition Weakness      09/24/20 0742           Follow-up Information    [facility] Follow up.   Specialty: Home Health Services Why: For home health services Contact information: [place] [address] [phone]        [name], MD. Schedule an appointment as soon as possible for a visit in 1 week(s).   Specialty: Internal Medicine Contact information: [place] [address] [phone]               Major procedures and Radiology Reports - PLEASE review detailed and final reports thoroughly  -        DG Chest Port 1 View  Result Date: 09/22/2020 CLINICAL DATA:  Chest pain EXAM: PORTABLE CHEST 1 VIEW COMPARISON:  January 07, 2019 FINDINGS: Heart size is stable from prior study. The patient has undergone prior valve replacement x2. The pulmonary arteries appear dilated, similar to prior study. There is no pneumothorax. No large pleural effusion. The lungs are hyperexpanded. Emphysematous changes are noted. IMPRESSION: No active disease. Electronically Signed   By: Constance Holster M.D.   On: 09/22/2020 01:17    Micro  Results     Recent Results (from the past 240 hour(s))  Blood Culture (routine x 2)     Status: None (Preliminary result)   Collection Time: 09/22/20  1:29 AM   Specimen: BLOOD  Result Value Ref Range Status   Specimen Description BLOOD RIGHT ARM  Final   Special Requests   Final    BOTTLES DRAWN AEROBIC AND ANAEROBIC Blood Culture results may not be optimal due to an inadequate volume of blood received in culture bottles   Culture   Final    NO GROWTH 2 DAYS Performed at [facility], [place] [address], [place], [place] [address]    Report Status PENDING  Incomplete  Respiratory Panel by RT PCR (Flu A&B, Covid) - Nasopharyngeal Swab     Status: Abnormal   Collection Time: 09/22/20  1:44 AM   Specimen: Nasopharyngeal Swab  Result Value Ref Range Status   SARS Coronavirus 2 by RT PCR POSITIVE (A) NEGATIVE Final    Comment: RESULT CALLED TO, READ BACK BY AND VERIFIED WITH: S WIRTZ RN 09/22/20 0409 JDW (NOTE) SARS-CoV-2 target nucleic acids are DETECTED.  SARS-CoV-2 RNA is generally detectable in upper respiratory specimens  during the acute phase of infection. Positive results are indicative of the presence of the identified virus, but do not rule out bacterial infection or co-infection with other pathogens not detected by the test. Clinical correlation with patient history and other diagnostic information is necessary to determine patient infection status. The expected result is Negative.  Fact Sheet for Patients:  PinkCheek.be  Fact Sheet for Healthcare Providers: GravelBags.it  This test is not yet approved or cleared by the [place] FDA and  has been authorized for detection and/or diagnosis of SARS-CoV-2 by FDA under an Emergency Use Authorization (EUA).  This EUA will remain in effect (meaning this test can be used) f or  the duration of  the COVID-19 declaration under Section 564(b)(1) of the Act,  21 U.S.C. section 360bbb-3(b)(1), unless the authorization is terminated or revoked sooner.      Influenza A by PCR NEGATIVE NEGATIVE Final   Influenza B by PCR NEGATIVE NEGATIVE Final    Comment: (NOTE) The Xpert Xpress SARS-CoV-2/FLU/RSV assay is intended as an aid in  the diagnosis of influenza from Nasopharyngeal swab specimens and  should not be used as a sole basis for treatment. Nasal washings and  aspirates are unacceptable for Xpert Xpress SARS-CoV-2/FLU/RSV  testing.  Fact Sheet for Patients: PinkCheek.be  Fact Sheet for Healthcare Providers: GravelBags.it  This test is not yet approved or cleared by the [place] FDA and  has been authorized for detection and/or diagnosis of SARS-CoV-2 by  FDA under an Emergency Use Authorization (EUA). This EUA will remain  in effect (meaning this test can be used) for the duration of the  Covid-19 declaration under Section 564(b)(1) of the Act, 21  U.S.C. section 360bbb-3(b)(1), unless the authorization is  terminated or revoked. Performed at [facility], [place] [address], [place], [place] [address]   Urine culture     Status: Abnormal   Collection Time: 09/22/20  2:59 AM   Specimen: In/Out Cath Urine  Result Value Ref Range Status   Specimen Description IN/OUT CATH URINE  Final   Special Requests   Final    NONE Performed at [facility], [place] [address], [place], [place] [address]    Culture MULTIPLE SPECIES PRESENT, SUGGEST RECOLLECTION (A)  Final   Report Status 09/22/2020 FINAL  Final  MRSA PCR Screening     Status: None   Collection Time: 09/22/20  6:42 AM   Specimen: Nasopharyngeal  Result Value Ref Range Status   MRSA by PCR NEGATIVE NEGATIVE Final    Comment:        The GeneXpert MRSA Assay (FDA approved for NASAL specimens only), is one component of a comprehensive MRSA colonization surveillance program. It is not intended to diagnose  MRSA infection nor to guide or monitor treatment for MRSA infections. Performed at [facility], [place] [address], [place], [place] [address]   Blood Culture (routine x 2)     Status: None (Preliminary result)   Collection Time: 09/22/20  6:59 AM   Specimen: BLOOD  Result Value Ref Range Status   Specimen Description BLOOD RIGHT ANTECUBITAL  Final   Special Requests   Final    BOTTLES DRAWN AEROBIC AND ANAEROBIC Blood Culture adequate volume   Culture   Final    NO GROWTH 2 DAYS Performed at [facility], [place] [address] [place], [place] [address]    Report Status PENDING  Incomplete    Today   Subjective    Jiayi Lengacher today has no headache,no chest abdominal pain,no new weakness tingling or numbness, feels much better wants to go home today.    Objective   Blood pressure (!) 111/99, pulse 66, temperature 97.7 F (36.5 C), temperature source Oral, resp. rate 15, height 5\' 3"  (1.6 m), weight 44.5 kg, SpO2 95 %.   Intake/Output Summary (Last 24 hours) at 09/25/2020 1014 Last data filed at 09/24/2020 1828 Gross per 24 hour  Intake 1067.17 ml  Output 2 ml  Net 1065.17 ml    Exam  Awake Alert, No new F.N deficits, Normal affect .AT,PERRAL Supple Neck,No JVD, No cervical lymphadenopathy appriciated.  Symmetrical Chest wall movement, Good air movement  bilaterally, CTAB RRR,No Gallops,Rubs or new Murmurs, No Parasternal Heave +ve B.Sounds, Abd Soft, Non tender, No organomegaly appriciated, No rebound -guarding or rigidity. No Cyanosis, Clubbing or edema, No new Rash or bruise   Data Review   CBC w Diff:  Lab Results  Component Value Date   WBC 3.0 (L) 09/25/2020   HGB 10.7 (L) 09/25/2020   HCT 32.8 (L) 09/25/2020   PLT 131 (L) 09/25/2020   LYMPHOPCT 51 09/25/2020   MONOPCT 8 09/25/2020   EOSPCT 6 09/25/2020   BASOPCT 0 09/25/2020    CMP:  Lab Results  Component Value Date   NA 138 09/25/2020   K 3.8 09/25/2020   CL 103 09/25/2020   CO2 27  09/25/2020   BUN <5 (L) 09/25/2020   CREATININE 0.83 09/25/2020   CREATININE 1.03 (H) 07/12/2020   PROT 4.6 (L) 09/25/2020   ALBUMIN 2.4 (L) 09/25/2020   BILITOT 0.1 (L) 09/25/2020   ALKPHOS 26 (L) 09/25/2020   AST 25 09/25/2020   ALT 14 09/25/2020  .   Total Time in preparing paper work, data evaluation and todays exam - 62 minutes  Lala Lund M.D on 09/25/2020 at 10:14 AM  [facility]   Office  [phone]

## 2020-09-25 NOTE — ED Triage Notes (Signed)
Patient slipped and fell backwards outside drugstore. Patient states that she remembers events and has laceration to back of head. No bleeding on arrival

## 2020-09-25 NOTE — Discharge Instructions (Signed)
Follow with Primary MD [name], MD in 7 days   Get CBC, CMP, 2 view Chest X ray -  checked next visit within 1 week by Primary MD   Activity: As tolerated with Full fall precautions use walker/cane & assistance as needed  Disposition Home    Diet: Heart Healthy   Special Instructions: If you have smoked or chewed Tobacco  in the last 2 yrs please stop smoking, stop any regular Alcohol  and or any Recreational drug use.  On your next visit with your primary care physician please Get Medicines reviewed and adjusted.  Please request your Prim.MD to go over all Hospital Tests and Procedure/Radiological results at the follow up, please get all Hospital records sent to your Prim MD by signing hospital release before you go home.  If you experience worsening of your admission symptoms, develop shortness of breath, life threatening emergency, suicidal or homicidal thoughts you must seek medical attention immediately by calling 911 or calling your MD immediately  if symptoms less severe.  You Must read complete instructions/literature along with all the possible adverse reactions/side effects for all the Medicines you take and that have been prescribed to you. Take any new Medicines after you have completely understood and accpet all the possible adverse reactions/side effects.         Person Under Monitoring Name: Michelle [name]  Location: [address] [place] [address]   Infection Prevention Recommendations for Individuals Confirmed to have, or Being Evaluated for, 2019 Novel Coronavirus (COVID-19) Infection Who Receive Care at Home  Individuals who are confirmed to have, or are being evaluated for, COVID-19 should follow the prevention steps below until a healthcare provider or local or state health department says they can return to normal activities.  Stay home except to get medical care You should restrict activities outside your home, except for getting  medical care. Do not go to work, school, or public areas, and do not use public transportation or taxis.  Call ahead before visiting your doctor Before your medical appointment, call the healthcare provider and tell them that you have, or are being evaluated for, COVID-19 infection. This will help the healthcare provider's office take steps to keep other people from getting infected. Ask your healthcare provider to call the local or state health department.  Monitor your symptoms Seek prompt medical attention if your illness is worsening (e.g., difficulty breathing). Before going to your medical appointment, call the healthcare provider and tell them that you have, or are being evaluated for, COVID-19 infection. Ask your healthcare provider to call the local or state health department.  Wear a facemask You should wear a facemask that covers your nose and mouth when you are in the same room with other people and when you visit a healthcare provider. People who live with or visit you should also wear a facemask while they are in the same room with you.  Separate yourself from other people in your home As much as possible, you should stay in a different room from other people in your home. Also, you should use a separate bathroom, if available.  Avoid sharing household items You should not share dishes, drinking glasses, cups, eating utensils, towels, bedding, or other items with other people in your home. After using these items, you should wash them thoroughly with soap and water.  Cover your coughs and sneezes Cover your mouth and nose with a tissue when you cough or sneeze, or you can cough  or sneeze into your sleeve. Throw used tissues in a lined trash can, and immediately wash your hands with soap and water for at least 20 seconds or use an alcohol-based hand rub.  Wash your [facility] your hands often and thoroughly with soap and water for at least 20 seconds. You can use an  alcohol-based hand sanitizer if soap and water are not available and if your hands are not visibly dirty. Avoid touching your eyes, nose, and mouth with unwashed hands.   Prevention Steps for Caregivers and Household Members of Individuals Confirmed to have, or Being Evaluated for, COVID-19 Infection Being Cared for in the Home  If you live with, or provide care at home for, a person confirmed to have, or being evaluated for, COVID-19 infection please follow these guidelines to prevent infection:  Follow healthcare provider's instructions Make sure that you understand and can help the patient follow any healthcare provider instructions for all care.  Provide for the patient's basic needs You should help the patient with basic needs in the home and provide support for getting groceries, prescriptions, and other personal needs.  Monitor the patient's symptoms If they are getting sicker, call his or her medical provider and tell them that the patient has, or is being evaluated for, COVID-19 infection. This will help the healthcare provider's office take steps to keep other people from getting infected. Ask the healthcare provider to call the local or state health department.  Limit the number of people who have contact with the patient  If possible, have only one caregiver for the patient.  Other household members should stay in another home or place of residence. If this is not possible, they should stay  in another room, or be separated from the patient as much as possible. Use a separate bathroom, if available.  Restrict visitors who do not have an essential need to be in the home.  Keep older adults, very young children, and other sick people away from the patient Keep older adults, very young children, and those who have compromised immune systems or chronic health conditions away from the patient. This includes people with chronic heart, lung, or kidney conditions, diabetes, and  cancer.  Ensure good ventilation Make sure that shared spaces in the home have good air flow, such as from an air conditioner or an opened window, weather permitting.  Wash your hands often  Wash your hands often and thoroughly with soap and water for at least 20 seconds. You can use an alcohol based hand sanitizer if soap and water are not available and if your hands are not visibly dirty.  Avoid touching your eyes, nose, and mouth with unwashed hands.  Use disposable paper towels to dry your hands. If not available, use dedicated cloth towels and replace them when they become wet.  Wear a facemask and gloves  Wear a disposable facemask at all times in the room and gloves when you touch or have contact with the patient's blood, body fluids, and/or secretions or excretions, such as sweat, saliva, sputum, nasal mucus, vomit, urine, or feces.  Ensure the mask fits over your nose and mouth tightly, and do not touch it during use.  Throw out disposable facemasks and gloves after using them. Do not reuse.  Wash your hands immediately after removing your facemask and gloves.  If your personal clothing becomes contaminated, carefully remove clothing and launder. Wash your hands after handling contaminated clothing.  Place all used disposable facemasks, gloves, and other  waste in a lined container before disposing them with other household waste.  Remove gloves and wash your hands immediately after handling these items.  Do not share dishes, glasses, or other household items with the patient  Avoid sharing household items. You should not share dishes, drinking glasses, cups, eating utensils, towels, bedding, or other items with a patient who is confirmed to have, or being evaluated for, COVID-19 infection.  After the person uses these items, you should wash them thoroughly with soap and water.  Wash laundry thoroughly  Immediately remove and wash clothes or bedding that have blood, body  fluids, and/or secretions or excretions, such as sweat, saliva, sputum, nasal mucus, vomit, urine, or feces, on them.  Wear gloves when handling laundry from the patient.  Read and follow directions on labels of laundry or clothing items and detergent. In general, wash and dry with the warmest temperatures recommended on the label.  Clean all areas the individual has used often  Clean all touchable surfaces, such as counters, tabletops, doorknobs, bathroom fixtures, toilets, phones, keyboards, tablets, and bedside tables, every day. Also, clean any surfaces that may have blood, body fluids, and/or secretions or excretions on them.  Wear gloves when cleaning surfaces the patient has come in contact with.  Use a diluted bleach solution (e.g., dilute bleach with 1 part bleach and 10 parts water) or a household disinfectant with a label that says EPA-registered for coronaviruses. To make a bleach solution at home, add 1 tablespoon of bleach to 1 quart (4 cups) of water. For a larger supply, add  cup of bleach to 1 gallon (16 cups) of water.  Read labels of cleaning products and follow recommendations provided on product labels. Labels contain instructions for safe and effective use of the cleaning product including precautions you should take when applying the product, such as wearing gloves or eye protection and making sure you have good ventilation during use of the product.  Remove gloves and wash hands immediately after cleaning.  Monitor yourself for signs and symptoms of illness Caregivers and household members are considered close contacts, should monitor their health, and will be asked to limit movement outside of the home to the extent possible. Follow the monitoring steps for close contacts listed on the symptom monitoring form.   ? If you have additional questions, contact your local health department or call the epidemiologist on call at [phone] (available 24/7). ? This  guidance is subject to change. For the most up-to-date guidance from [facility], please refer to their website: [email or web address].pl

## 2020-09-25 NOTE — Progress Notes (Signed)
Michelle [name] to be D/C'd home per MD order. Discussed with the patient and all questions fully answered. VVS, Skin clean, dry and intact without evidence of skin break down, no evidence of skin tears noted. [name] sent with patient. IV catheter discontinued intact. Site without signs and symptoms of complications. Dressing and pressure applied.  An After Visit Summary was printed and given to the patient.  Patient escorted via [place], and D/C home via private auto.  [name]

## 2020-09-26 LAB — CBC WITH DIFFERENTIAL/PLATELET
Abs Immature Granulocytes: 0.01 10*3/uL (ref 0.00–0.07)
Basophils Absolute: 0 10*3/uL (ref 0.0–0.1)
Basophils Relative: 0 %
Eosinophils Absolute: 0.2 10*3/uL (ref 0.0–0.5)
Eosinophils Relative: 5 %
HCT: 34.6 % — ABNORMAL LOW (ref 36.0–46.0)
Hemoglobin: 11.1 g/dL — ABNORMAL LOW (ref 12.0–15.0)
Immature Granulocytes: 0 %
Lymphocytes Relative: 28 %
Lymphs Abs: 1 10*3/uL (ref 0.7–4.0)
MCH: 29.8 pg (ref 26.0–34.0)
MCHC: 32.1 g/dL (ref 30.0–36.0)
MCV: 93 fL (ref 80.0–100.0)
Monocytes Absolute: 0.3 10*3/uL (ref 0.1–1.0)
Monocytes Relative: 8 %
Neutro Abs: 2.2 10*3/uL (ref 1.7–7.7)
Neutrophils Relative %: 59 %
Platelets: 171 10*3/uL (ref 150–400)
RBC: 3.72 MIL/uL — ABNORMAL LOW (ref 3.87–5.11)
RDW: 13.4 % (ref 11.5–15.5)
WBC: 3.7 10*3/uL — ABNORMAL LOW (ref 4.0–10.5)
nRBC: 0 % (ref 0.0–0.2)

## 2020-09-26 LAB — BASIC METABOLIC PANEL
Anion gap: 11 (ref 5–15)
BUN: 5 mg/dL — ABNORMAL LOW (ref 8–23)
CO2: 26 mmol/L (ref 22–32)
Calcium: 8.1 mg/dL — ABNORMAL LOW (ref 8.9–10.3)
Chloride: 102 mmol/L (ref 98–111)
Creatinine, Ser: 1.01 mg/dL — ABNORMAL HIGH (ref 0.44–1.00)
GFR, Estimated: 56 mL/min — ABNORMAL LOW (ref >60)
Glucose, Bld: 70 mg/dL (ref 70–99)
Potassium: 4.2 mmol/L (ref 3.5–5.1)
Sodium: 139 mmol/L (ref 135–145)

## 2020-09-26 NOTE — ED Notes (Signed)
Laying B/P 125/73  P.  70 Sitting  B/P  124/86  P. 71 Standing  B/P  129/97  P.  73

## 2020-09-27 ENCOUNTER — Telehealth: Payer: Self-pay

## 2020-09-27 ENCOUNTER — Other Ambulatory Visit: Payer: Self-pay

## 2020-09-27 LAB — CULTURE, BLOOD (ROUTINE X 2)
Culture: NO GROWTH
Culture: NO GROWTH
Special Requests: ADEQUATE

## 2020-09-27 NOTE — Patient Outreach (Signed)
[facility]) Care Management  09/27/2020  Michelle [name] 01/31/1951 [id number]   Referral Date: 09/27/20 Referral Source: [employer] Report Date of Discharge: 09/25/20 Facility:  [place]: [facility]   Referral received.  No outreach warranted at this time.  Transition of Care calls being completed via EMMI. RN CM will outreach patient for any red flags received.    Plan: RN CM will close case.    Jone Baseman, RN, MSN [facility] Care Management Care Management Coordinator Direct Line [phone] Toll Free: [phone]  Fax: [phone]

## 2020-09-27 NOTE — Telephone Encounter (Cosign Needed)
Transition Care Management Follow-up Telephone Call  Date of discharge and from where: 09/25/2020 from [facility]  How have you been since you were released from the hospital? Does not feel any better; having skin breakage, blisters, itchy & burning. Having issue with memory.  Any questions or concerns? Yes; would like to see Dr. Ronnald Ramp asap  Items Reviewed:  Did the pt receive and understand the discharge instructions provided? Yes   Medications obtained and verified? Yes   Any new allergies since your discharge? No   Dietary orders reviewed? Yes (Heart Healthy Diet)  Do you have support at home? Yes  (significant other)  Functional Questionnaire: (I = Independent and D = Dependent) ADLs: I  Bathing/Dressing- I (use grab bars)  Meal Prep- I   Eating- I  Maintaining continence- I  Transferring/Ambulation- I  Managing Meds- I  Follow up appointments reviewed:   PCP Hospital f/u appt confirmed? Yes  Scheduled to see Dr. [name] on 10/04/2020 @ 3:00 PM.  [facility] f/u appt confirmed? No    Are transportation arrangements needed? Yes   If their condition worsens, is the pt aware to call PCP or go to the Emergency Dept.? Yes  Was the patient provided with contact information for the PCP's office or ED? Yes  Was to pt encouraged to call back with questions or concerns? Yes

## 2020-09-28 ENCOUNTER — Other Ambulatory Visit: Payer: Self-pay

## 2020-09-28 DIAGNOSIS — N1832 Chronic kidney disease, stage 3b: Secondary | ICD-10-CM | POA: Diagnosis not present

## 2020-09-28 DIAGNOSIS — I129 Hypertensive chronic kidney disease with stage 1 through stage 4 chronic kidney disease, or unspecified chronic kidney disease: Secondary | ICD-10-CM | POA: Diagnosis not present

## 2020-09-28 DIAGNOSIS — F411 Generalized anxiety disorder: Secondary | ICD-10-CM | POA: Diagnosis not present

## 2020-09-28 DIAGNOSIS — F32A Depression, unspecified: Secondary | ICD-10-CM | POA: Diagnosis not present

## 2020-09-28 DIAGNOSIS — D696 Thrombocytopenia, unspecified: Secondary | ICD-10-CM | POA: Diagnosis not present

## 2020-09-28 DIAGNOSIS — J439 Emphysema, unspecified: Secondary | ICD-10-CM | POA: Diagnosis not present

## 2020-09-28 DIAGNOSIS — M810 Age-related osteoporosis without current pathological fracture: Secondary | ICD-10-CM | POA: Diagnosis not present

## 2020-09-28 DIAGNOSIS — I959 Hypotension, unspecified: Secondary | ICD-10-CM | POA: Diagnosis not present

## 2020-09-28 DIAGNOSIS — U071 COVID-19: Secondary | ICD-10-CM | POA: Diagnosis not present

## 2020-09-28 NOTE — Patient Outreach (Signed)
[facility]) Care Management  09/28/2020  Michelle [name] [birth year] [id number]    EMMI-General Discharge RED ON EMMI ALERT Day # 1 Date: 09/27/20 Red Alert Reason: "Got discharge appears? I don't know  Know who to call about changes in condition? No  New prescriptions? I don't know"   Outreach attempt # 1 to patient. No answer. RN CM left HIPAA compliant voicemail message along with contact info.     Plan: RN CM will will make outreach attempt to patient within 3-4 business days. RN CM will send unsuccessful outreach letter to patient.  Enzo Montgomery, RN,BSN,CCM [place] Management Telephonic Care Management Coordinator Direct Phone: [phone] Toll Free: [phone] Fax: [phone]

## 2020-09-29 ENCOUNTER — Other Ambulatory Visit: Payer: Self-pay

## 2020-09-29 ENCOUNTER — Telehealth: Payer: Self-pay | Admitting: Internal Medicine

## 2020-09-29 ENCOUNTER — Inpatient Hospital Stay (HOSPITAL_COMMUNITY)
Admission: EM | Admit: 2020-09-29 | Discharge: 2020-10-02 | DRG: 178 | Disposition: A | Discharge status: Home Health | Payer: Medicare HMO | Attending: Internal Medicine | Admitting: Internal Medicine

## 2020-09-29 ENCOUNTER — Emergency Department (HOSPITAL_COMMUNITY): Payer: Medicare HMO

## 2020-09-29 ENCOUNTER — Encounter (HOSPITAL_COMMUNITY): Payer: Self-pay | Admitting: Emergency Medicine

## 2020-09-29 DIAGNOSIS — I340 Nonrheumatic mitral (valve) insufficiency: Secondary | ICD-10-CM | POA: Diagnosis not present

## 2020-09-29 DIAGNOSIS — R54 Age-related physical debility: Secondary | ICD-10-CM | POA: Diagnosis not present

## 2020-09-29 DIAGNOSIS — I361 Nonrheumatic tricuspid (valve) insufficiency: Secondary | ICD-10-CM | POA: Diagnosis not present

## 2020-09-29 DIAGNOSIS — R079 Chest pain, unspecified: Secondary | ICD-10-CM | POA: Diagnosis not present

## 2020-09-29 DIAGNOSIS — U071 COVID-19: Secondary | ICD-10-CM | POA: Diagnosis not present

## 2020-09-29 DIAGNOSIS — Z9181 History of falling: Secondary | ICD-10-CM | POA: Diagnosis not present

## 2020-09-29 DIAGNOSIS — S0003XA Contusion of scalp, initial encounter: Secondary | ICD-10-CM | POA: Diagnosis present

## 2020-09-29 DIAGNOSIS — F411 Generalized anxiety disorder: Secondary | ICD-10-CM | POA: Diagnosis not present

## 2020-09-29 DIAGNOSIS — Z79899 Other long term (current) drug therapy: Secondary | ICD-10-CM | POA: Diagnosis not present

## 2020-09-29 DIAGNOSIS — Z8774 Personal history of (corrected) congenital malformations of heart and circulatory system: Secondary | ICD-10-CM

## 2020-09-29 DIAGNOSIS — I951 Orthostatic hypotension: Secondary | ICD-10-CM | POA: Diagnosis not present

## 2020-09-29 DIAGNOSIS — I10 Essential (primary) hypertension: Secondary | ICD-10-CM | POA: Diagnosis present

## 2020-09-29 DIAGNOSIS — Z7982 Long term (current) use of aspirin: Secondary | ICD-10-CM | POA: Diagnosis not present

## 2020-09-29 DIAGNOSIS — I452 Bifascicular block: Secondary | ICD-10-CM | POA: Diagnosis not present

## 2020-09-29 DIAGNOSIS — W19XXXA Unspecified fall, initial encounter: Secondary | ICD-10-CM | POA: Diagnosis present

## 2020-09-29 DIAGNOSIS — Z880 Allergy status to penicillin: Secondary | ICD-10-CM

## 2020-09-29 DIAGNOSIS — I9589 Other hypotension: Secondary | ICD-10-CM | POA: Diagnosis not present

## 2020-09-29 DIAGNOSIS — E860 Dehydration: Secondary | ICD-10-CM | POA: Diagnosis present

## 2020-09-29 DIAGNOSIS — R42 Dizziness and giddiness: Secondary | ICD-10-CM | POA: Diagnosis not present

## 2020-09-29 DIAGNOSIS — E785 Hyperlipidemia, unspecified: Secondary | ICD-10-CM | POA: Diagnosis present

## 2020-09-29 DIAGNOSIS — R0789 Other chest pain: Secondary | ICD-10-CM | POA: Diagnosis not present

## 2020-09-29 DIAGNOSIS — K582 Mixed irritable bowel syndrome: Secondary | ICD-10-CM | POA: Diagnosis not present

## 2020-09-29 DIAGNOSIS — M810 Age-related osteoporosis without current pathological fracture: Secondary | ICD-10-CM | POA: Diagnosis present

## 2020-09-29 DIAGNOSIS — I959 Hypotension, unspecified: Secondary | ICD-10-CM | POA: Diagnosis not present

## 2020-09-29 DIAGNOSIS — D696 Thrombocytopenia, unspecified: Secondary | ICD-10-CM | POA: Diagnosis not present

## 2020-09-29 DIAGNOSIS — Z79890 Hormone replacement therapy (postmenopausal): Secondary | ICD-10-CM | POA: Diagnosis not present

## 2020-09-29 DIAGNOSIS — R4189 Other symptoms and signs involving cognitive functions and awareness: Secondary | ICD-10-CM | POA: Diagnosis present

## 2020-09-29 DIAGNOSIS — I4510 Unspecified right bundle-branch block: Secondary | ICD-10-CM | POA: Diagnosis not present

## 2020-09-29 DIAGNOSIS — R197 Diarrhea, unspecified: Secondary | ICD-10-CM | POA: Diagnosis present

## 2020-09-29 DIAGNOSIS — E876 Hypokalemia: Secondary | ICD-10-CM | POA: Diagnosis not present

## 2020-09-29 DIAGNOSIS — R55 Syncope and collapse: Secondary | ICD-10-CM | POA: Diagnosis not present

## 2020-09-29 DIAGNOSIS — K589 Irritable bowel syndrome without diarrhea: Secondary | ICD-10-CM | POA: Diagnosis present

## 2020-09-29 DIAGNOSIS — R531 Weakness: Secondary | ICD-10-CM | POA: Diagnosis not present

## 2020-09-29 DIAGNOSIS — Z888 Allergy status to other drugs, medicaments and biological substances status: Secondary | ICD-10-CM

## 2020-09-29 DIAGNOSIS — F32A Depression, unspecified: Secondary | ICD-10-CM | POA: Diagnosis not present

## 2020-09-29 DIAGNOSIS — Z87891 Personal history of nicotine dependence: Secondary | ICD-10-CM

## 2020-09-29 DIAGNOSIS — I129 Hypertensive chronic kidney disease with stage 1 through stage 4 chronic kidney disease, or unspecified chronic kidney disease: Secondary | ICD-10-CM | POA: Diagnosis not present

## 2020-09-29 DIAGNOSIS — Z8249 Family history of ischemic heart disease and other diseases of the circulatory system: Secondary | ICD-10-CM

## 2020-09-29 DIAGNOSIS — E861 Hypovolemia: Secondary | ICD-10-CM | POA: Diagnosis not present

## 2020-09-29 DIAGNOSIS — N1832 Chronic kidney disease, stage 3b: Secondary | ICD-10-CM | POA: Diagnosis not present

## 2020-09-29 DIAGNOSIS — R519 Headache, unspecified: Secondary | ICD-10-CM | POA: Diagnosis not present

## 2020-09-29 DIAGNOSIS — J439 Emphysema, unspecified: Secondary | ICD-10-CM | POA: Diagnosis not present

## 2020-09-29 DIAGNOSIS — E039 Hypothyroidism, unspecified: Secondary | ICD-10-CM | POA: Diagnosis present

## 2020-09-29 LAB — CBC
HCT: 33.6 % — ABNORMAL LOW (ref 36.0–46.0)
Hemoglobin: 10.8 g/dL — ABNORMAL LOW (ref 12.0–15.0)
MCH: 30.3 pg (ref 26.0–34.0)
MCHC: 32.1 g/dL (ref 30.0–36.0)
MCV: 94.4 fL (ref 80.0–100.0)
Platelets: 352 10*3/uL (ref 150–400)
RBC: 3.56 MIL/uL — ABNORMAL LOW (ref 3.87–5.11)
RDW: 13.7 % (ref 11.5–15.5)
WBC: 6 10*3/uL (ref 4.0–10.5)
nRBC: 0 % (ref 0.0–0.2)

## 2020-09-29 LAB — BASIC METABOLIC PANEL
Anion gap: 11 (ref 5–15)
BUN: 11 mg/dL (ref 8–23)
CO2: 26 mmol/L (ref 22–32)
Calcium: 8.4 mg/dL — ABNORMAL LOW (ref 8.9–10.3)
Chloride: 103 mmol/L (ref 98–111)
Creatinine, Ser: 1.18 mg/dL — ABNORMAL HIGH (ref 0.44–1.00)
GFR, Estimated: 47 mL/min — ABNORMAL LOW (ref >60)
Glucose, Bld: 84 mg/dL (ref 70–99)
Potassium: 3 mmol/L — ABNORMAL LOW (ref 3.5–5.1)
Sodium: 140 mmol/L (ref 135–145)

## 2020-09-29 NOTE — Telephone Encounter (Signed)
Pt has been informed that the office has a 37-SEG window policy in place to be seen in office. Since she was positive for COVID 7 days ago she would be ineligible. I have informed her of PCPs verbal recommendation to [place] that she should be evaluated for any further issues with her BP at [facility] or ED. She tearfully expressed understanding.

## 2020-09-29 NOTE — Telephone Encounter (Signed)
Patient was recently in hospital with covid  Preformed PT eval today-  BP  Seating 100/70 Standing 90/60 Noted she had dizziness and diarrhea  Temperature 98.9 She had a fall at some point in the last few weeks and she hit her head and she has moderate pain in head and no breaks in skin no other details about fall patient is a poor historian. Seems like she is home alone during day and has questions if she can be at home safely by herself has social work Associate Professor and wasn't aware of her medications or that she had covid  Phsical therpay orders for 1 week 1 time  2 weeks 2 times 1 week [place]- Physical Therapist with Al[facility]mings Phone# [phone] Stated okay to LVM
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
## 2020-09-29 NOTE — Patient Outreach (Signed)
[facility]) Care Management  09/29/2020  VALE PERAZA 1951-10-15 [id number]   EMMI-General Discharge RED ON EMMI ALERT Day # 1 Date: 09/27/20 Red Alert Reason: "Got discharge appears? I don't know  Know who to call about changes in condition? No  New prescriptions? I don't know"   Outreach attempt # 2 to patient. Spoke with patient who reports she is doing fairly well since returning home. Reviewed and addressed red alerts. Patient confirms she has her paperwork in the home. She is aware to call MD/PCP for any issues or concerns and voices she has MD contact info. She goes for follow up appt on 10/04/20. She denies any issues with transportation. Patient reports she has all her meds in the home and is able to manage her meds on her own. She denies any RN CM needs or concerns at this time. Advised patient that they would get one more automated EMMI-GENERAL post discharge calls to assess how they are doing following recent hospitalization and will receive a call from a nurse if any of their responses were abnormal. Patient voiced understanding and was appreciative of f/u call.    Plan: RN CM will close case at this time.   Enzo Montgomery, RN,BSN,CCM [facility] Telephonic Care Management Coordinator Direct Phone: [phone] Toll Free: [phone] Fax: [phone]

## 2020-09-29 NOTE — ED Triage Notes (Signed)
Pt brought to ED by [facility] from home for c/o dizziness and HA since she fell almost a week ago. Pt is AO x 4 poor historian on triage. BP 100/70, HR 68, r-20, SPO2 100 RA. CBG 100. 500 mL NS given by EMS.

## 2020-09-30 ENCOUNTER — Emergency Department (HOSPITAL_COMMUNITY): Payer: Medicare HMO

## 2020-09-30 ENCOUNTER — Observation Stay (HOSPITAL_BASED_OUTPATIENT_CLINIC_OR_DEPARTMENT_OTHER): Payer: Medicare HMO

## 2020-09-30 DIAGNOSIS — F411 Generalized anxiety disorder: Secondary | ICD-10-CM

## 2020-09-30 DIAGNOSIS — I340 Nonrheumatic mitral (valve) insufficiency: Secondary | ICD-10-CM | POA: Diagnosis not present

## 2020-09-30 DIAGNOSIS — I361 Nonrheumatic tricuspid (valve) insufficiency: Secondary | ICD-10-CM

## 2020-09-30 DIAGNOSIS — E876 Hypokalemia: Secondary | ICD-10-CM | POA: Diagnosis not present

## 2020-09-30 DIAGNOSIS — Z8774 Personal history of (corrected) congenital malformations of heart and circulatory system: Secondary | ICD-10-CM

## 2020-09-30 DIAGNOSIS — R55 Syncope and collapse: Secondary | ICD-10-CM | POA: Diagnosis not present

## 2020-09-30 LAB — URINALYSIS, ROUTINE W REFLEX MICROSCOPIC
Bilirubin Urine: NEGATIVE
Glucose, UA: NEGATIVE mg/dL
Hgb urine dipstick: NEGATIVE
Ketones, ur: 5 mg/dL — AB
Leukocytes,Ua: NEGATIVE
Nitrite: NEGATIVE
Protein, ur: NEGATIVE mg/dL
Specific Gravity, Urine: 1.014 (ref 1.005–1.030)
pH: 6 (ref 5.0–8.0)

## 2020-09-30 LAB — BASIC METABOLIC PANEL
Anion gap: 10 (ref 5–15)
BUN: 9 mg/dL (ref 8–23)
CO2: 25 mmol/L (ref 22–32)
Calcium: 8.2 mg/dL — ABNORMAL LOW (ref 8.9–10.3)
Chloride: 104 mmol/L (ref 98–111)
Creatinine, Ser: 1.02 mg/dL — ABNORMAL HIGH (ref 0.44–1.00)
GFR, Estimated: 56 mL/min — ABNORMAL LOW (ref >60)
Glucose, Bld: 73 mg/dL (ref 70–99)
Potassium: 2.8 mmol/L — ABNORMAL LOW (ref 3.5–5.1)
Sodium: 139 mmol/L (ref 135–145)

## 2020-09-30 LAB — TROPONIN I (HIGH SENSITIVITY)
Troponin I (High Sensitivity): 13 ng/L (ref <18)
Troponin I (High Sensitivity): 13 ng/L (ref <18)

## 2020-09-30 LAB — ECHOCARDIOGRAM COMPLETE
AR max vel: 3.31 cm2
AV Area VTI: 3.34 cm2
AV Area mean vel: 3.49 cm2
AV Mean grad: 4 mmHg
AV Peak grad: 7.6 mmHg
Ao pk vel: 1.38 m/s
Area-P 1/2: 3.6 cm2
Calc EF: 65.6 %
Height: 64 in
S' Lateral: 2.1 cm
Single Plane A2C EF: 62.6 %
Single Plane A4C EF: 68.2 %
Weight: 1568 oz

## 2020-09-30 LAB — RAPID URINE DRUG SCREEN, HOSP PERFORMED
Amphetamines: NOT DETECTED
Barbiturates: NOT DETECTED
Benzodiazepines: NOT DETECTED
Cocaine: NOT DETECTED
Opiates: NOT DETECTED
Tetrahydrocannabinol: NOT DETECTED

## 2020-09-30 LAB — ETHANOL: Alcohol, Ethyl (B): <10 mg/dL (ref <10)

## 2020-09-30 LAB — D-DIMER, QUANTITATIVE: D-Dimer, Quant: 1.29 ug/mL-FEU — ABNORMAL HIGH (ref 0.00–0.50)

## 2020-09-30 LAB — MAGNESIUM
Magnesium: 1.5 mg/dL — ABNORMAL LOW (ref 1.7–2.4)
Magnesium: 2.2 mg/dL (ref 1.7–2.4)

## 2020-09-30 MED ORDER — SERTRALINE HCL 100 MG PO TABS
100.0000 mg | ORAL_TABLET | Freq: Every day | ORAL | Status: DC
  Administered 2020-09-30 – 2020-10-02 (×3): 100 mg via ORAL
  Filled 2020-09-30 (×3): qty 1

## 2020-09-30 MED ORDER — FENTANYL CITRATE (PF) 100 MCG/2ML IJ SOLN
50.0000 ug | Freq: Once | INTRAMUSCULAR | Status: AC
  Administered 2020-09-30: 50 ug via INTRAVENOUS
  Filled 2020-09-30: qty 2

## 2020-09-30 MED ORDER — ENOXAPARIN SODIUM 30 MG/0.3ML ~~LOC~~ SOLN
30.0000 mg | SUBCUTANEOUS | Status: DC
  Administered 2020-09-30 – 2020-10-02 (×3): 30 mg via SUBCUTANEOUS
  Filled 2020-09-30 (×3): qty 0.3

## 2020-09-30 MED ORDER — ASPIRIN 81 MG PO CHEW
81.0000 mg | CHEWABLE_TABLET | Freq: Every day | ORAL | Status: DC
  Administered 2020-09-30 – 2020-10-02 (×3): 81 mg via ORAL
  Filled 2020-09-30 (×3): qty 1

## 2020-09-30 MED ORDER — ONDANSETRON HCL 4 MG PO TABS: 4.0000 mg | ORAL_TABLET | Freq: Four times a day (QID) | ORAL | Status: DC | PRN

## 2020-09-30 MED ORDER — DICYCLOMINE HCL 10 MG PO CAPS
10.0000 mg | ORAL_CAPSULE | Freq: Three times a day (TID) | ORAL | Status: DC
  Administered 2020-09-30 – 2020-10-02 (×9): 10 mg via ORAL
  Filled 2020-09-30 (×10): qty 1

## 2020-09-30 MED ORDER — POTASSIUM CHLORIDE CRYS ER 20 MEQ PO TBCR
40.0000 meq | EXTENDED_RELEASE_TABLET | ORAL | Status: AC
  Administered 2020-09-30 (×3): 40 meq via ORAL
  Filled 2020-09-30 (×3): qty 2

## 2020-09-30 MED ORDER — SODIUM CHLORIDE 0.9 % IV SOLN: INTRAVENOUS | Status: AC

## 2020-09-30 MED ORDER — AMITRIPTYLINE HCL 50 MG PO TABS
50.0000 mg | ORAL_TABLET | Freq: Every day | ORAL | Status: DC
  Administered 2020-09-30 – 2020-10-01 (×2): 50 mg via ORAL
  Filled 2020-09-30 (×3): qty 1

## 2020-09-30 MED ORDER — POTASSIUM CHLORIDE CRYS ER 20 MEQ PO TBCR
40.0000 meq | EXTENDED_RELEASE_TABLET | Freq: Once | ORAL | Status: AC
  Administered 2020-09-30: 40 meq via ORAL
  Filled 2020-09-30: qty 2

## 2020-09-30 MED ORDER — ONDANSETRON HCL 4 MG/2ML IJ SOLN: 4.0000 mg | Freq: Four times a day (QID) | INTRAMUSCULAR | Status: DC | PRN

## 2020-09-30 MED ORDER — DONEPEZIL HCL 10 MG PO TABS
10.0000 mg | ORAL_TABLET | Freq: Every day | ORAL | Status: DC
  Administered 2020-09-30 – 2020-10-01 (×2): 10 mg via ORAL
  Filled 2020-09-30 (×2): qty 1

## 2020-09-30 MED ORDER — OMEGA-3-ACID ETHYL ESTERS 1 G PO CAPS
2.0000 | ORAL_CAPSULE | Freq: Every day | ORAL | Status: DC
  Administered 2020-09-30 – 2020-10-02 (×3): 2 g via ORAL
  Filled 2020-09-30 (×3): qty 2

## 2020-09-30 MED ORDER — ENOXAPARIN SODIUM 40 MG/0.4ML ~~LOC~~ SOLN: 40.0000 mg | SUBCUTANEOUS | Status: DC

## 2020-09-30 MED ORDER — ACETAMINOPHEN 325 MG PO TABS
650.0000 mg | ORAL_TABLET | Freq: Four times a day (QID) | ORAL | Status: DC | PRN
  Administered 2020-09-30 – 2020-10-02 (×2): 650 mg via ORAL
  Filled 2020-09-30 (×2): qty 2

## 2020-09-30 MED ORDER — VITAMIN D 25 MCG (1000 UNIT) PO TABS
1000.0000 [IU] | ORAL_TABLET | Freq: Every day | ORAL | Status: DC
  Administered 2020-09-30 – 2020-10-02 (×3): 1000 [IU] via ORAL
  Filled 2020-09-30 (×3): qty 1

## 2020-09-30 MED ORDER — LORATADINE 10 MG PO TABS
10.0000 mg | ORAL_TABLET | Freq: Every day | ORAL | Status: DC
  Administered 2020-09-30 – 2020-10-02 (×3): 10 mg via ORAL
  Filled 2020-09-30 (×3): qty 1

## 2020-09-30 MED ORDER — MAGNESIUM SULFATE 2 GM/50ML IV SOLN
2.0000 g | Freq: Once | INTRAVENOUS | Status: AC
  Administered 2020-09-30: 2 g via INTRAVENOUS
  Filled 2020-09-30: qty 50

## 2020-09-30 MED ORDER — SODIUM CHLORIDE 0.9% FLUSH
3.0000 mL | Freq: Two times a day (BID) | INTRAVENOUS | Status: DC
  Administered 2020-09-30 – 2020-10-02 (×4): 3 mL via INTRAVENOUS

## 2020-09-30 MED ORDER — TRIAMCINOLONE ACETONIDE 0.1 % EX OINT
1.0000 "application " | TOPICAL_OINTMENT | Freq: Two times a day (BID) | CUTANEOUS | Status: DC
  Administered 2020-09-30 – 2020-10-02 (×5): 1 via TOPICAL
  Filled 2020-09-30 (×3): qty 15

## 2020-09-30 MED ORDER — ACETAMINOPHEN 650 MG RE SUPP: 650.0000 mg | Freq: Four times a day (QID) | RECTAL | Status: DC | PRN

## 2020-09-30 MED ORDER — LEVOTHYROXINE SODIUM 50 MCG PO TABS
50.0000 ug | ORAL_TABLET | Freq: Every day | ORAL | Status: DC
  Administered 2020-09-30 – 2020-10-02 (×3): 50 ug via ORAL
  Filled 2020-09-30 (×3): qty 1

## 2020-09-30 MED ORDER — SIMVASTATIN 20 MG PO TABS
10.0000 mg | ORAL_TABLET | Freq: Every day | ORAL | Status: DC
  Administered 2020-09-30 – 2020-10-02 (×3): 10 mg via ORAL
  Filled 2020-09-30 (×3): qty 1

## 2020-09-30 MED ORDER — ALBUTEROL SULFATE HFA 108 (90 BASE) MCG/ACT IN AERS
2.0000 | INHALATION_SPRAY | Freq: Four times a day (QID) | RESPIRATORY_TRACT | Status: DC | PRN
  Filled 2020-09-30: qty 6.7
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
## 2020-09-30 NOTE — ED Notes (Signed)
Lelon Perla [phone] would like a phone call with an update

## 2020-09-30 NOTE — ED Notes (Signed)
Pt ambulated in room with assistance. Pt did not want to walk, stated that if she does not feel good when she is at home then she does not walk. Encouraged pt to walk to the door and back.

## 2020-09-30 NOTE — ED Notes (Signed)
Attempted to call report x 4. 

## 2020-09-30 NOTE — ED Notes (Signed)
Attempted to call report x2

## 2020-09-30 NOTE — ED Notes (Addendum)
Notified Dr. [place] of pt's hypotension. Pt's pressures now 88/64 and  Asymptomatic. No new orders at this time. Advised to continue NS at 75 ml/hr.

## 2020-09-30 NOTE — Progress Notes (Signed)
PROGRESS NOTE    AIYANNA [name]  AVW:[id number] DOB: 1951-07-03 DOA: 09/29/2020 PCP: [name], MD    Brief Narrative:  Michelle [name] is Michelle 69 year old female with past medical history notable for tetralogy of Fallot status post surgical repair 1958, anxiety/depression, essential hypertension who presents to the ED with lightheadedness, dizziness, and presyncopal episode.  Patient lives with her significant other, but not home at time and episode unwitnessed.  Patient recently admitted on 09/22/2020 through 09/25/2020 for acute renal failure and viral gastroenteritis related to Covid-19 viral infection.  She was given monoclonal antibody infusion during that hospitalization prior to discharge.  In the ED, patient complained of chest pain associate with anxiety and crying.  Troponin was noted to be 13, within normal limits.  EKGs shows normal sinus rhythm with right bundle branch block and LAFB, which is unchanged from priors.  CT head shows small scalp hematoma otherwise no acute intracranial abnormality.  Chest x-ray with no acute cardiopulmonary disease process.  Labs notable for potassium 3.0 and magnesium of 1.5 which were replaced.  Hospitalist service consulted for admission for further evaluation of presyncopal episode.   Assessment & Plan:   Principal Problem:   Syncope Active Problems:   Anxiety state   Tetralogy of Fallot s/p repair   Syncopal versus presyncopal episode Patient presenting to the ED with questionable syncopal versus presyncopal episode at home.  Unwitnessed.  Patient states has felt dizzy and weak since recent hospitalization for Covid-19 viral syndrome that was causing some nausea and diarrhea.  Patient received monoclonal antibody infusion during the hospitalization.  Patient is unvaccinated.  TTE with LVEF 60 to 65%, mild LVH, RA mildly dilated, noted TV annuloplasty repair with mild/moderate TR and pulmonary valve repair with trivial vegetation, mild  dilation aortic root, 40 mm and normal IVC.  Likely etiology of syncope versus presyncope episode from dehydration with poor oral intake and recent Covid-19 viral infection. --monitor on telemetry --Check orthostatic vital signs --IV fluid hydration x 1 day --PT/OT evaluation --Supportive care  Hypotension Blood pressure 90/59 this morning, suspect volume depletion in the setting of poor oral intake and recent viral illness.  Not on any antihypertensive regimen. --IV fluid hydration --Closely monitor blood pressure --Obtain orthostatic vital signs  Hypokalemia Hypomagnesemia Potassium 3.0 magnesium 1.5 on admission, repleted. --Repeat K2.8, magnesium 2.2; will continue repletion of potassium --Repeat potassium and magnesium in the Michelle.m.  Anxiety/depression: --Elavil 50 mg p.o. nightly  Cognitive impairment --Donepezil 10 mg p.o. nightly  Hypothyroidism --TSH 1.420 on 09/22/2020 --Levothyroxine 50 mcg p.o. daily  Hx tetralogy of Fallot Follows with [place] [facility].  Underwent surgical repair 1958.  TTE 09/30/2020 with LVEF 60 to 65%, moderate LVH, right atrium mildly enlarged, noted TV angioplasty repair with mild/moderate TR, noted PVR with trivial regurgitation, mild dilation aortic root 40 mm, normal IVC. --Continue outpatient follow-up with cardiology  Recent Covid-19 viral syndrome Unvaccinated.  Covid-19 PCR on 09/22/2020.  Recent hospitalization from 09/22/2020 through 09/25/2020.  Only symptoms were nausea and diarrhea.  Patient received monoclonal antibody infusion.  Patient was not hypoxic during present hospitalization nor is she currently.  Encourage Covid-19 vaccination 90 days from antibody infusion.  DVT prophylaxis: Lovenox Code Status: Full code Family Communication: Updated patient extensively at bedside  Disposition Plan:  Status is: Observation  The patient remains OBS appropriate and will d/c before 2 midnights.  Dispo: The patient is from: Home               Anticipated  d/c is to: Home              Anticipated d/c date is: 1 day              Patient currently is not medically stable to d/c.   Consultants:   None  Procedures:   TTE 09/30/2020  Antimicrobials:   None   Subjective: Patient seen and examined bedside, resting comfortably.  Currently having echocardiogram performed.  Continues with weakness, fatigue.  No other specific complaints at this time.  Denies headache, no visual changes, no chest pain, palpitations, no shortness of breath, no abdominal pain.  No acute events per nursing staff overnight.  Objective: Vitals:   09/30/20 1200 09/30/20 1215 09/30/20 1230 09/30/20 1415  BP: 119/83 107/69 103/61 (!) 94/58  Pulse: 65 (!) 51 60 76  Resp: 15 18 (!) 22 19  Temp:      TempSrc:      SpO2: 98% 97% 97% 97%  Weight:      Height:       No intake or output data in the 24 hours ending 09/30/20 1446 Filed Weights   09/29/20 1824 09/29/20 1828 09/30/20 0415  Weight: 44.5 kg 44.5 kg 44.5 kg    Examination:  General exam: Appears calm and comfortable, thin in appearance Respiratory system: Clear to auscultation. Respiratory effort normal.  Oxygenating well on room air Cardiovascular system: S1 & S2 heard, RRR. No JVD, murmurs, rubs, gallops or clicks. No pedal edema. Gastrointestinal system: Abdomen is nondistended, soft and nontender. No organomegaly or masses felt. Normal bowel sounds heard. Central nervous system: Alert and oriented. No focal neurological deficits. Extremities: Symmetric 5 x 5 power. Skin: No rashes, lesions or ulcers Psychiatry: Judgement and insight appear normal. Mood & affect appropriate.     Data Reviewed: I have personally reviewed following labs and imaging studies  CBC: Recent Labs  Lab 09/24/20 0404 09/25/20 0500 09/26/20 0002 09/29/20 1830  WBC 3.4* 3.0* 3.7* 6.0  NEUTROABS  --  1.1* 2.2  --   HGB 11.0* 10.7* 11.1* 10.8*  HCT 32.6* 32.8* 34.6* 33.6*  MCV 91.1 90.4 93.0 94.4    PLT 120* 131* 171 774   Basic Metabolic Panel: Recent Labs  Lab 09/24/20 0404 09/25/20 0500 09/26/20 0002 09/29/20 1830 09/29/20 2352 09/30/20 0856  NA 138 138 139 140  --  139  K 4.2 3.8 4.2 3.0*  --  2.8*  CL 104 103 102 103  --  104  CO2 25 27 26 26   --  25  GLUCOSE 75 69* 70 84  --  73  BUN 8 <5* 5* 11  --  9  CREATININE 1.05* 0.83 1.01* 1.18*  --  1.02*  CALCIUM 7.7* 7.8* 8.1* 8.4*  --  8.2*  MG 1.4* 1.7  --   --  1.5* 2.2   GFR: Estimated Creatinine Clearance: 36.1 mL/min (Michelle) (by C-G formula based on SCr of 1.02 mg/dL (H)). Liver Function Tests: Recent Labs  Lab 09/25/20 0500  AST 25  ALT 14  ALKPHOS 26*  BILITOT 0.1*  PROT 4.6*  ALBUMIN 2.4*   No results for input(s): LIPASE, AMYLASE in the last 168 hours. No results for input(s): AMMONIA in the last 168 hours. Coagulation Profile: No results for input(s): INR, PROTIME in the last 168 hours. Cardiac Enzymes: No results for input(s): CKTOTAL, CKMB, CKMBINDEX, TROPONINI in the last 168 hours. BNP (last 3 results) No results for input(s): PROBNP in the last 8760 hours. HbA1C:  No results for input(s): HGBA1C in the last 72 hours. CBG: No results for input(s): GLUCAP in the last 168 hours. Lipid Profile: No results for input(s): CHOL, HDL, LDLCALC, TRIG, CHOLHDL, LDLDIRECT in the last 72 hours. Thyroid Function Tests: No results for input(s): TSH, T4TOTAL, FREET4, T3FREE, THYROIDAB in the last 72 hours. Anemia Panel: No results for input(s): VITAMINB12, FOLATE, FERRITIN, TIBC, IRON, RETICCTPCT in the last 72 hours. Sepsis Labs: Recent Labs  Lab 09/24/20 0838 09/25/20 0500  PROCALCITON <0.10 <0.10    Recent Results (from the past 240 hour(s))  Blood Culture (routine x 2)     Status: None   Collection Time: 09/22/20  1:29 AM   Specimen: BLOOD  Result Value Ref Range Status   Specimen Description BLOOD RIGHT ARM  Final   Special Requests   Final    BOTTLES DRAWN AEROBIC AND ANAEROBIC Blood Culture  results may not be optimal due to an inadequate volume of blood received in culture bottles   Culture   Final    NO GROWTH 5 DAYS Performed at [facility], [place] [address] [place],  [address]    Report Status 09/27/2020 FINAL  Final  Respiratory Panel by RT PCR (Flu Michelle&B, Covid) - Nasopharyngeal Swab     Status: Abnormal   Collection Time: 09/22/20  1:44 AM   Specimen: Nasopharyngeal Swab  Result Value Ref Range Status   SARS Coronavirus 2 by RT PCR POSITIVE (Michelle) NEGATIVE Final    Comment: RESULT CALLED TO, READ BACK BY AND VERIFIED WITH: S WIRTZ RN 09/22/20 0409 JDW (NOTE) SARS-CoV-2 target nucleic acids are DETECTED.  SARS-CoV-2 RNA is generally detectable in upper respiratory specimens  during the acute phase of infection. Positive results are indicative of the presence of the identified virus, but do not rule out bacterial infection or co-infection with other pathogens not detected by the test. Clinical correlation with patient history and other diagnostic information is necessary to determine patient infection status. The expected result is Negative.  Fact Sheet for Patients:  PinkCheek.be  Fact Sheet for Healthcare Providers: GravelBags.it  This test is not yet approved or cleared by the [place] FDA and  has been authorized for detection and/or diagnosis of SARS-CoV-2 by FDA under an Emergency Use Authorization (EUA).  This EUA will remain in effect (meaning this test can be used) f or the duration of  the COVID-19 declaration under Section 564(b)(1) of the Act, 21 U.S.C. section 360bbb-3(b)(1), unless the authorization is terminated or revoked sooner.      Influenza Michelle by PCR NEGATIVE NEGATIVE Final   Influenza B by PCR NEGATIVE NEGATIVE Final    Comment: (NOTE) The Xpert Xpress SARS-CoV-2/FLU/RSV assay is intended as an aid in  the diagnosis of influenza from Nasopharyngeal swab specimens and    should not be used as Michelle sole basis for treatment. Nasal washings and  aspirates are unacceptable for Xpert Xpress SARS-CoV-2/FLU/RSV  testing.  Fact Sheet for Patients: PinkCheek.be  Fact Sheet for Healthcare Providers: GravelBags.it  This test is not yet approved or cleared by the [place] FDA and  has been authorized for detection and/or diagnosis of SARS-CoV-2 by  FDA under an Emergency Use Authorization (EUA). This EUA will remain  in effect (meaning this test can be used) for the duration of the  Covid-19 declaration under Section 564(b)(1) of the Act, 21  U.S.C. section 360bbb-3(b)(1), unless the authorization is  terminated or revoked. Performed at [facility], [place]  [address] [place], [place] [address]   Urine culture     Status: Abnormal   Collection Time: 09/22/20  2:59 AM   Specimen: In/Out Cath Urine  Result Value Ref Range Status   Specimen Description IN/OUT CATH URINE  Final   Special Requests   Final    NONE Performed at [facility], [place] [address] [place], [place] [address]    Culture MULTIPLE SPECIES PRESENT, SUGGEST RECOLLECTION (Michelle)  Final   Report Status 09/22/2020 FINAL  Final  MRSA PCR Screening     Status: None   Collection Time: 09/22/20  6:42 AM   Specimen: Nasopharyngeal  Result Value Ref Range Status   MRSA by PCR NEGATIVE NEGATIVE Final    Comment:        The GeneXpert MRSA Assay (FDA approved for NASAL specimens only), is one component of Michelle comprehensive MRSA colonization surveillance program. It is not intended to diagnose MRSA infection nor to guide or monitor treatment for MRSA infections. Performed at [facility], [place] [address] [place], [place] [address]   Blood Culture (routine x 2)     Status: None   Collection Time: 09/22/20  6:59 AM   Specimen: BLOOD  Result Value Ref Range Status   Specimen Description BLOOD RIGHT ANTECUBITAL  Final    Special Requests   Final    BOTTLES DRAWN AEROBIC AND ANAEROBIC Blood Culture adequate volume   Culture   Final    NO GROWTH 5 DAYS Performed at [facility], [place] [address], [place], [place] [address]    Report Status 09/27/2020 FINAL  Final         Radiology Studies: CT HEAD WO CONTRAST  Result Date: 09/30/2020 CLINICAL DATA:  Dizziness, headache EXAM: CT HEAD WITHOUT CONTRAST TECHNIQUE: Contiguous axial images were obtained from the base of the skull through the vertex without intravenous contrast. COMPARISON:  None. FINDINGS: Brain: Normal anatomic configuration. Parenchymal volume loss is commensurate with the patient's age. Mild periventricular white matter changes are present likely reflecting the sequela of small vessel ischemia. Probable dilated perivascular space seen within the inferior left basal ganglia. No abnormal intra or extra-axial mass lesion or fluid collection. No abnormal mass effect or midline shift. No evidence of acute intracranial hemorrhage or infarct. Ventricular size is normal. Cerebellum unremarkable. Vascular: No asymmetric hyperdense vasculature at the skull base. Skull: Intact Sinuses/Orbits: There is mild mucosal thickening within the ethmoid air cells as well as Michelle small air-fluid level within the right frontal sinus in keeping with changes of mild paranasal sinus disease. Remaining paranasal sinuses are clear. Orbits are unremarkable. Other: Mastoid air cells and middle ear cavities are clear. Small left parasagittal posterior scalp hematoma noted. IMPRESSION: Small scalp hematoma. No acute intracranial abnormality. No calvarial fracture. Mild paranasal sinus disease. Electronically Signed   By: Fidela Salisbury MD   On: 09/30/2020 01:20   DG Chest Port 1 View  Result Date: 09/30/2020 CLINICAL DATA:  Chest pain EXAM: PORTABLE CHEST 1 VIEW COMPARISON:  09/22/2020 FINDINGS: The heart size and mediastinal contours are within normal limits. Both lungs are  clear. The visualized skeletal structures are unremarkable. IMPRESSION: No active disease. Electronically Signed   By: Ulyses Jarred M.D.   On: 09/30/2020 00:14   ECHOCARDIOGRAM COMPLETE  Result Date: 09/30/2020    ECHOCARDIOGRAM REPORT   Patient Name:   BENTLEY HARALSON Date of Exam: 09/30/2020 Medical Rec #:  [id number]        Height:  64.0 in Accession #:    [id number]       Weight:       98.0 lb Date of Birth:  [birth year]         BSA:          1.445 m Patient Age:    60 years         BP:           98/59 mmHg Patient Gender: F                HR:           59 bpm. Exam Location:  Inpatient Procedure: 2D Echo, 3D Echo, Cardiac Doppler and Color Doppler Indications:    R55 Syncope  History:        Patient has prior history of Echocardiogram examinations, most                 recent 07/23/2016. Arrythmias:RBBB, Signs/Symptoms:Murmur and                 Syncope; Risk Factors:Dyslipidemia. Tetralogy of Fallot repair                 at age 2. Covid 19 positive.  Sonographer:    Roseanna Rainbow RDCS Referring Phys: [id number] JARED M GARDNER  Sonographer Comments: Technically difficult study due to poor echo windows. Patient supine. IMPRESSIONS  1. S/P pulmonary valve replacement with 27 mm Trifecta valve 01/2017. Grossly normal, valve is not well visualized. Mean gradient 4 mmHg, Vmax 1.5 m/s, peak gradient 8 mmHg.  2. S/p TV annuloplasty 01/2017. Grossly normal appearance of TV annuloplasty repair. The tricuspid valve is has been repaired/replaced. The tricuspid valve is status post repair with an annuloplasty ring. Tricuspid valve regurgitation is mild to moderate.  3. Right ventricular systolic function is moderately reduced. The right ventricular size is moderately enlarged. There is normal pulmonary artery systolic pressure. The estimated right ventricular systolic pressure is 61.9 mmHg.  4. No residual atrial level shunt s/p ASD repair.  5. No residual VSD s/p repair of Tetralogy of Fallot in childhood.  6. Left  ventricular ejection fraction, by estimation, is 60 to 65%. The left ventricle has normal function. The left ventricle has no regional wall motion abnormalities. There is mild left ventricular hypertrophy. Left ventricular diastolic parameters are consistent with Grade I diastolic dysfunction (impaired relaxation).  7. Right atrial size was mildly dilated.  8. The mitral valve is normal in structure. Mild mitral valve regurgitation.  9. The aortic valve is tricuspid. Aortic valve regurgitation is trivial. 10. Aortic dilatation noted. There is mild dilatation of the aortic root, measuring 40 mm. 11. The inferior vena cava is normal in size with greater than 50% respiratory variability, suggesting right atrial pressure of 3 mmHg. Conclusion(s)/Recommendation(s): No definite echo findings to support an etiology for syncope. FINDINGS  Left Ventricle: Left ventricular ejection fraction, by estimation, is 60 to 65%. The left ventricle has normal function. The left ventricle has no regional wall motion abnormalities. The left ventricular internal cavity size was normal in size. There is  mild left ventricular hypertrophy. Left ventricular diastolic parameters are consistent with Grade I diastolic dysfunction (impaired relaxation). Right Ventricle: The right ventricular size is moderately enlarged. No increase in right ventricular wall thickness. Right ventricular systolic function is moderately reduced. There is normal pulmonary artery systolic pressure. The tricuspid regurgitant velocity is 2.67 m/s, and with an assumed right atrial pressure of 3 mmHg, the estimated right ventricular systolic  pressure is 31.5 mmHg. Left Atrium: Left atrial size was normal in size. Right Atrium: Right atrial size was mildly dilated. Prominent Eustachian valve. Pericardium: There is no evidence of pericardial effusion. Mitral Valve: The mitral valve is normal in structure. Mild mitral valve regurgitation. Tricuspid Valve: S/p TV  annuloplasty 01/2017. Grossly normal appearance of TV annuloplasty repair. The tricuspid valve is has been repaired/replaced. Tricuspid valve regurgitation is mild to moderate. No evidence of tricuspid stenosis. The tricuspid valve  is status post repair with an annuloplasty ring. Aortic Valve: The aortic valve is tricuspid. Aortic valve regurgitation is trivial. Aortic valve mean gradient measures 4.0 mmHg. Aortic valve peak gradient measures 7.6 mmHg. Aortic valve area, by VTI measures 3.34 cm. Pulmonic Valve: S/P pulmonary valve replacement with 27 mm Trifecta valve 01/2017. Grossly normal, valve is not well visualized. Mean gradient 4 mmHg, Vmax 1.5 m/s, peak gradient 8 mmHg. The pulmonic valve was not well visualized. Pulmonic valve regurgitation is trivial. No evidence of pulmonic stenosis. Aorta: Aortic dilatation noted. There is mild dilatation of the aortic root, measuring 40 mm. Venous: The inferior vena cava is normal in size with greater than 50% respiratory variability, suggesting right atrial pressure of 3 mmHg. IAS/Shunts: No atrial level shunt detected by color flow Doppler.  LEFT VENTRICLE PLAX 2D LVIDd:         3.90 cm     Diastology LVIDs:         2.10 cm     LV e' medial:    4.05 cm/s LV PW:         1.20 cm     LV E/e' medial:  13.6 LV IVS:        1.10 cm     LV e' lateral:   10.20 cm/s LVOT diam:     2.40 cm     LV E/e' lateral: 5.4 LV SV:         100 LV SV Index:   69 LVOT Area:     4.52 cm  LV Volumes (MOD) LV vol d, MOD A2C: 54.6 ml LV vol d, MOD A4C: 53.5 ml LV vol s, MOD A2C: 20.4 ml LV vol s, MOD A4C: 17.0 ml LV SV MOD A2C:     34.2 ml LV SV MOD A4C:     53.5 ml LV SV MOD BP:      36.0 ml RIGHT VENTRICLE            IVC RV S prime:     8.06 cm/s  IVC diam: 1.50 cm TAPSE (M-mode): 1.9 cm LEFT ATRIUM           Index       RIGHT ATRIUM           Index LA diam:      3.20 cm 2.21 cm/m  RA Area:     15.00 cm LA Vol (A2C): 12.4 ml 8.58 ml/m  RA Volume:   41.70 ml  28.86 ml/m LA Vol (A4C): 28.4  ml 19.66 ml/m  AORTIC VALVE AV Area (Vmax):    3.31 cm AV Area (Vmean):   3.49 cm AV Area (VTI):     3.34 cm AV Vmax:           138.00 cm/s AV Vmean:          87.300 cm/s AV VTI:            0.299 m AV Peak Grad:      7.6 mmHg AV Mean Grad:  4.0 mmHg LVOT Vmax:         101.00 cm/s LVOT Vmean:        67.300 cm/s LVOT VTI:          0.221 m LVOT/AV VTI ratio: 0.74  AORTA Ao Root diam: 3.80 cm Ao Asc diam:  4.00 cm MITRAL VALVE               TRICUSPID VALVE MV Area (PHT): 3.60 cm    TR Peak grad:   28.5 mmHg MV Decel Time: 211 msec    TR Vmax:        267.00 cm/s MV E velocity: 54.90 cm/s MV Michelle velocity: 65.60 cm/s  SHUNTS MV E/Michelle ratio:  0.84        Systemic VTI:  0.22 m                            Systemic Diam: 2.40 cm Cherlynn Kaiser MD Electronically signed by Cherlynn Kaiser MD Signature Date/Time: 09/30/2020/1:23:55 PM    Final         Scheduled Meds: . amitriptyline  50 mg Oral QHS  . aspirin  81 mg Oral Daily  . cholecalciferol  1,000 Units Oral Daily  . dicyclomine  10 mg Oral TID AC  . donepezil  10 mg Oral QHS  . enoxaparin (LOVENOX) injection  30 mg Subcutaneous Q24H  . levothyroxine  50 mcg Oral Daily  . loratadine  10 mg Oral Daily  . omega-3 acid ethyl esters  2 capsule Oral Daily  . potassium chloride  40 mEq Oral Q3H  . sertraline  100 mg Oral Daily  . simvastatin  10 mg Oral q1800  . sodium chloride flush  3 mL Intravenous Q12H  . triamcinolone ointment  1 application Topical BID   Continuous Infusions:   LOS: 0 days    Time spent: 39 minutes spent on chart review, discussion with nursing staff, consultants, updating family and interview/physical exam; more than 50% of that time was spent in counseling and/or coordination of care.    Ayana Imhof J [place] DO [facility] Available via Epic secure chat 7am-7pm After these hours, please refer to coverage provider listed on [email or web address] 09/30/2020, 2:46 PM

## 2020-09-30 NOTE — Progress Notes (Signed)
°  Echocardiogram 2D Echocardiogram has been performed.  Michelle [name] 09/30/2020, 9:04 AM

## 2020-09-30 NOTE — Plan of Care (Signed)

## 2020-09-30 NOTE — ED Notes (Signed)
Attempted to call report x 5- asked to speak w charge, phone rang and went back to secretary, was given charge extension. ED charge made aware

## 2020-10-01 DIAGNOSIS — M810 Age-related osteoporosis without current pathological fracture: Secondary | ICD-10-CM | POA: Diagnosis present

## 2020-10-01 DIAGNOSIS — E039 Hypothyroidism, unspecified: Secondary | ICD-10-CM | POA: Diagnosis present

## 2020-10-01 DIAGNOSIS — I9589 Other hypotension: Secondary | ICD-10-CM

## 2020-10-01 DIAGNOSIS — U071 COVID-19: Secondary | ICD-10-CM | POA: Diagnosis present

## 2020-10-01 DIAGNOSIS — E860 Dehydration: Secondary | ICD-10-CM | POA: Diagnosis present

## 2020-10-01 DIAGNOSIS — Z8774 Personal history of (corrected) congenital malformations of heart and circulatory system: Secondary | ICD-10-CM | POA: Diagnosis not present

## 2020-10-01 DIAGNOSIS — Z9181 History of falling: Secondary | ICD-10-CM | POA: Diagnosis not present

## 2020-10-01 DIAGNOSIS — F32A Depression, unspecified: Secondary | ICD-10-CM | POA: Diagnosis present

## 2020-10-01 DIAGNOSIS — Z8249 Family history of ischemic heart disease and other diseases of the circulatory system: Secondary | ICD-10-CM | POA: Diagnosis not present

## 2020-10-01 DIAGNOSIS — S0003XA Contusion of scalp, initial encounter: Secondary | ICD-10-CM | POA: Diagnosis present

## 2020-10-01 DIAGNOSIS — R4189 Other symptoms and signs involving cognitive functions and awareness: Secondary | ICD-10-CM | POA: Diagnosis present

## 2020-10-01 DIAGNOSIS — R54 Age-related physical debility: Secondary | ICD-10-CM | POA: Diagnosis present

## 2020-10-01 DIAGNOSIS — R197 Diarrhea, unspecified: Secondary | ICD-10-CM | POA: Diagnosis present

## 2020-10-01 DIAGNOSIS — Z79890 Hormone replacement therapy (postmenopausal): Secondary | ICD-10-CM | POA: Diagnosis not present

## 2020-10-01 DIAGNOSIS — Z87891 Personal history of nicotine dependence: Secondary | ICD-10-CM | POA: Diagnosis not present

## 2020-10-01 DIAGNOSIS — K589 Irritable bowel syndrome without diarrhea: Secondary | ICD-10-CM | POA: Diagnosis present

## 2020-10-01 DIAGNOSIS — F411 Generalized anxiety disorder: Secondary | ICD-10-CM | POA: Diagnosis present

## 2020-10-01 DIAGNOSIS — E876 Hypokalemia: Secondary | ICD-10-CM | POA: Diagnosis present

## 2020-10-01 DIAGNOSIS — I10 Essential (primary) hypertension: Secondary | ICD-10-CM | POA: Diagnosis present

## 2020-10-01 DIAGNOSIS — Z79899 Other long term (current) drug therapy: Secondary | ICD-10-CM | POA: Diagnosis not present

## 2020-10-01 DIAGNOSIS — Z7982 Long term (current) use of aspirin: Secondary | ICD-10-CM | POA: Diagnosis not present

## 2020-10-01 DIAGNOSIS — I452 Bifascicular block: Secondary | ICD-10-CM | POA: Diagnosis present

## 2020-10-01 DIAGNOSIS — R55 Syncope and collapse: Secondary | ICD-10-CM | POA: Diagnosis not present

## 2020-10-01 DIAGNOSIS — E861 Hypovolemia: Secondary | ICD-10-CM

## 2020-10-01 DIAGNOSIS — E785 Hyperlipidemia, unspecified: Secondary | ICD-10-CM | POA: Diagnosis present

## 2020-10-01 DIAGNOSIS — R079 Chest pain, unspecified: Secondary | ICD-10-CM | POA: Diagnosis present

## 2020-10-01 DIAGNOSIS — W19XXXA Unspecified fall, initial encounter: Secondary | ICD-10-CM | POA: Diagnosis present

## 2020-10-01 LAB — CBC
HCT: 35.3 % — ABNORMAL LOW (ref 36.0–46.0)
Hemoglobin: 11.5 g/dL — ABNORMAL LOW (ref 12.0–15.0)
MCH: 30.8 pg (ref 26.0–34.0)
MCHC: 32.6 g/dL (ref 30.0–36.0)
MCV: 94.6 fL (ref 80.0–100.0)
Platelets: 435 10*3/uL — ABNORMAL HIGH (ref 150–400)
RBC: 3.73 MIL/uL — ABNORMAL LOW (ref 3.87–5.11)
RDW: 14 % (ref 11.5–15.5)
WBC: 6 10*3/uL (ref 4.0–10.5)
nRBC: 0 % (ref 0.0–0.2)

## 2020-10-01 LAB — BASIC METABOLIC PANEL
Anion gap: 9 (ref 5–15)
BUN: 9 mg/dL (ref 8–23)
CO2: 23 mmol/L (ref 22–32)
Calcium: 8.4 mg/dL — ABNORMAL LOW (ref 8.9–10.3)
Chloride: 108 mmol/L (ref 98–111)
Creatinine, Ser: 0.97 mg/dL (ref 0.44–1.00)
GFR, Estimated: 59 mL/min — ABNORMAL LOW (ref >60)
Glucose, Bld: 79 mg/dL (ref 70–99)
Potassium: 4.6 mmol/L (ref 3.5–5.1)
Sodium: 140 mmol/L (ref 135–145)

## 2020-10-01 LAB — GLUCOSE, CAPILLARY
Glucose-Capillary: 67 mg/dL — ABNORMAL LOW (ref 70–99)
Glucose-Capillary: 102 mg/dL — ABNORMAL HIGH (ref 70–99)

## 2020-10-01 LAB — MAGNESIUM: Magnesium: 1.9 mg/dL (ref 1.7–2.4)

## 2020-10-01 MED ORDER — ENSURE ENLIVE PO LIQD
237.0000 mL | Freq: Three times a day (TID) | ORAL | Status: DC
  Administered 2020-10-01 – 2020-10-02 (×2): 237 mL via ORAL

## 2020-10-01 MED ORDER — LOPERAMIDE HCL 2 MG PO CAPS: 2.0000 mg | ORAL_CAPSULE | ORAL | Status: DC | PRN

## 2020-10-01 NOTE — Progress Notes (Signed)
Patient still having diarrhea. Says she's had it for about a week, but is not really sure.

## 2020-10-01 NOTE — Progress Notes (Addendum)
ATTEMPTED to RETURN pt BOYFRIEND callS on phone number listed in chart x4. Phone went straight to voicemail; this RN was unable to leave a voicemail because voicemail box has not been set up. Will await another call and continue to make attempts until end of shift. Will report to PM RN.

## 2020-10-01 NOTE — Progress Notes (Signed)
PROGRESS NOTE    JAMESIA LINNEN  STM:[id number] DOB: May 08, 1951 DOA: 09/29/2020 PCP: [name], MD    Brief Narrative:  MANUELLA BLACKSON is a 69 year old female with past medical history notable for tetralogy of Fallot status post surgical repair 1958, anxiety/depression, essential hypertension who presents to the ED with lightheadedness, dizziness, and presyncopal episode.  Patient lives with her significant other, but not home at time and episode unwitnessed.  Patient recently admitted on 09/22/2020 through 09/25/2020 for acute renal failure and viral gastroenteritis related to Covid-19 viral infection.  She was given monoclonal antibody infusion during that hospitalization prior to discharge.  In the ED, patient complained of chest pain associate with anxiety and crying.  Troponin was noted to be 13, within normal limits.  EKGs shows normal sinus rhythm with right bundle branch block and LAFB, which is unchanged from priors. CT head shows small scalp hematoma otherwise no acute intracranial abnormality.  Chest x-ray with no acute cardiopulmonary disease process.  Labs notable for potassium 3.0 and magnesium of 1.5 which were replaced.  Hospitalist service consulted for admission for further evaluation of presyncopal episode.   Assessment & Plan:   Principal Problem:   Syncope Active Problems:   Anxiety state   Tetralogy of Fallot s/p repair   Syncopal versus presyncopal episode Patient presenting to the ED with questionable syncopal versus presyncopal episode at home.  Unwitnessed.  Patient states has felt dizzy and weak since recent hospitalization for Covid-19 viral syndrome that was causing some nausea and diarrhea.  Patient received monoclonal antibody infusion during the hospitalization.  Patient is unvaccinated.  TTE with LVEF 60 to 65%, mild LVH, RA mildly dilated, noted TV annuloplasty repair with mild/moderate TR and pulmonary valve repair with trivial vegetation, mild  dilation aortic root, 40 mm and normal IVC.  Likely etiology of syncope versus presyncope episode from dehydration with poor oral intake and recent Covid-19 viral infection. --monitor on telemetry --Repeat orthostatic vital signs today --continue IVF w/ NS at 154mL/hr --PT evaluation pending --Supportive care  Orthostatic Hypotension Blood pressure 84/62 this morning while in room with tech. Etiology volume depletion in the setting of poor oral intake and recent viral illness.  Not on any antihypertensive regimen. --Continue IV fluid hydration, increase NS to 123mL/hr --Closely monitor blood pressure --Obtain orthostatic vital signs  Diarrhea Etiology likely secondary to recent Covid-19 viral infection.  Patient is afebrile without leukocytosis. --Imodium as needed --Supportive care, IV fluid hydration  Hypokalemia Hypomagnesemia Repleted on admission, potassium 4.6 today. --Repeat potassium and magnesium in the a.m.  Anxiety/depression: --Elavil 50 mg p.o. nightly  Cognitive impairment --Donepezil 10 mg p.o. nightly  Hypothyroidism --TSH 1.420 on 09/22/2020 --Levothyroxine 50 mcg p.o. daily  Hx tetralogy of Fallot Follows with [facility].  Underwent surgical repair 1958.  TTE 09/30/2020 with LVEF 60 to 65%, moderate LVH, right atrium mildly enlarged, noted TV angioplasty repair with mild/moderate TR, noted PVR with trivial regurgitation, mild dilation aortic root 40 mm, normal IVC. --Continue outpatient follow-up with cardiology  Recent Covid-19 viral syndrome Unvaccinated.  Covid-19 PCR on 09/22/2020.  Recent hospitalization from 09/22/2020 through 09/25/2020.  Only symptoms were nausea and diarrhea.  Patient received monoclonal antibody infusion.  Patient was not hypoxic during present hospitalization..  Encourage Covid-19 vaccination 90 days from antibody infusion. --continue airborne/contact isolation 21 days from diagnosis while inpatient  DVT prophylaxis:  Lovenox Code Status: Full code Family Communication: Updated patient extensively at bedside  Disposition Plan:  Status is: Observation  The  patient will require care spanning > 2 midnights and should be moved to inpatient because: Hemodynamically unstable, Persistent severe electrolyte disturbances, Ongoing diagnostic testing needed not appropriate for outpatient work up, Unsafe d/c plan, IV treatments appropriate due to intensity of illness or inability to take PO and Inpatient level of care appropriate due to severity of illness  Dispo: The patient is from: Home              Anticipated d/c is to: Home              Anticipated d/c date is: 1 day              Patient currently is not medically stable to d/c.  Continues with hypotension, needs IV fluid hydration and pending PT evaluation   Consultants:   None  Procedures:   TTE 09/30/2020  Antimicrobials:   None   Subjective: Patient seen and examined bedside, resting comfortably.  Patient continues with some dizziness on standing.  Patient hypotensive this morning.  Continues with diarrhea. No other specific complaints at this time.  Denies headache, no visual changes, no chest pain, palpitations, no shortness of breath, no abdominal pain.  No acute events per nursing staff overnight.  Objective: Vitals:   09/30/20 2100 10/01/20 0121 10/01/20 0400 10/01/20 0817  BP:  103/69 104/63   Pulse:   (!) 59   Resp:  20 16   Temp: 97.6 F (36.4 C) (!) 97 F (36.1 C) 98.3 F (36.8 C) 98 F (36.7 C)  TempSrc: Oral Oral Oral Oral  SpO2:  96% 93%   Weight:  44.5 kg    Height:        Intake/Output Summary (Last 24 hours) at 10/01/2020 1200 Last data filed at 10/01/2020 0700 Gross per 24 hour  Intake 1298 ml  Output 200 ml  Net 1098 ml   Filed Weights   09/29/20 1828 09/30/20 0415 10/01/20 0121  Weight: 44.5 kg 44.5 kg 44.5 kg    Examination:  General exam: Appears calm and comfortable, thin in appearance Respiratory  system: Clear to auscultation. Respiratory effort normal.  Oxygenating well on room air Cardiovascular system: S1 & S2 heard, RRR. No JVD, murmurs, rubs, gallops or clicks. No pedal edema. Gastrointestinal system: Abdomen is nondistended, soft and nontender. No organomegaly or masses felt. Normal bowel sounds heard. Central nervous system: Alert and oriented. No focal neurological deficits. Extremities: Symmetric 5 x 5 power. Skin: No rashes, lesions or ulcers Psychiatry: Judgement and insight appear normal. Mood & affect appropriate.     Data Reviewed: I have personally reviewed following labs and imaging studies  CBC: Recent Labs  Lab 09/25/20 0500 09/26/20 0002 09/29/20 1830 10/01/20 0425  WBC 3.0* 3.7* 6.0 6.0  NEUTROABS 1.1* 2.2  --   --   HGB 10.7* 11.1* 10.8* 11.5*  HCT 32.8* 34.6* 33.6* 35.3*  MCV 90.4 93.0 94.4 94.6  PLT 131* 171 352 381*   Basic Metabolic Panel: Recent Labs  Lab 09/25/20 0500 09/26/20 0002 09/29/20 1830 09/29/20 2352 09/30/20 0856 10/01/20 0425  NA 138 139 140  --  139 140  K 3.8 4.2 3.0*  --  2.8* 4.6  CL 103 102 103  --  104 108  CO2 27 26 26   --  25 23  GLUCOSE 69* 70 84  --  73 79  BUN <5* 5* 11  --  9 9  CREATININE 0.83 1.01* 1.18*  --  1.02* 0.97  CALCIUM 7.8* 8.1* 8.4*  --  8.2* 8.4*  MG 1.7  --   --  1.5* 2.2 1.9   GFR: Estimated Creatinine Clearance: 37.9 mL/min (by C-G formula based on SCr of 0.97 mg/dL). Liver Function Tests: Recent Labs  Lab 09/25/20 0500  AST 25  ALT 14  ALKPHOS 26*  BILITOT 0.1*  PROT 4.6*  ALBUMIN 2.4*   No results for input(s): LIPASE, AMYLASE in the last 168 hours. No results for input(s): AMMONIA in the last 168 hours. Coagulation Profile: No results for input(s): INR, PROTIME in the last 168 hours. Cardiac Enzymes: No results for input(s): CKTOTAL, CKMB, CKMBINDEX, TROPONINI in the last 168 hours. BNP (last 3 results) No results for input(s): PROBNP in the last 8760 hours. HbA1C: No  results for input(s): HGBA1C in the last 72 hours. CBG: Recent Labs  Lab 10/01/20 0650 10/01/20 0804  GLUCAP 67* 102*   Lipid Profile: No results for input(s): CHOL, HDL, LDLCALC, TRIG, CHOLHDL, LDLDIRECT in the last 72 hours. Thyroid Function Tests: No results for input(s): TSH, T4TOTAL, FREET4, T3FREE, THYROIDAB in the last 72 hours. Anemia Panel: No results for input(s): VITAMINB12, FOLATE, FERRITIN, TIBC, IRON, RETICCTPCT in the last 72 hours. Sepsis Labs: Recent Labs  Lab 09/25/20 0500  PROCALCITON <0.10    Recent Results (from the past 240 hour(s))  Blood Culture (routine x 2)     Status: None   Collection Time: 09/22/20  1:29 AM   Specimen: BLOOD  Result Value Ref Range Status   Specimen Description BLOOD RIGHT ARM  Final   Special Requests   Final    BOTTLES DRAWN AEROBIC AND ANAEROBIC Blood Culture results may not be optimal due to an inadequate volume of blood received in culture bottles   Culture   Final    NO GROWTH 5 DAYS Performed at [facility], [place] [address] [place], [place] [address]    Report Status 09/27/2020 FINAL  Final  Respiratory Panel by RT PCR (Flu A&B, Covid) - Nasopharyngeal Swab     Status: Abnormal   Collection Time: 09/22/20  1:44 AM   Specimen: Nasopharyngeal Swab  Result Value Ref Range Status   SARS Coronavirus 2 by RT PCR POSITIVE (A) NEGATIVE Final    Comment: RESULT CALLED TO, READ BACK BY AND VERIFIED WITH: S WIRTZ RN 09/22/20 0409 JDW (NOTE) SARS-CoV-2 target nucleic acids are DETECTED.  SARS-CoV-2 RNA is generally detectable in upper respiratory specimens  during the acute phase of infection. Positive results are indicative of the presence of the identified virus, but do not rule out bacterial infection or co-infection with other pathogens not detected by the test. Clinical correlation with patient history and other diagnostic information is necessary to determine patient infection status. The expected result is  Negative.  Fact Sheet for Patients:  PinkCheek.be  Fact Sheet for Healthcare Providers: GravelBags.it  This test is not yet approved or cleared by the [place] FDA and  has been authorized for detection and/or diagnosis of SARS-CoV-2 by FDA under an Emergency Use Authorization (EUA).  This EUA will remain in effect (meaning this test can be used) f or the duration of  the COVID-19 declaration under Section 564(b)(1) of the Act, 21 U.S.C. section 360bbb-3(b)(1), unless the authorization is terminated or revoked sooner.      Influenza A by PCR NEGATIVE NEGATIVE Final   Influenza B by PCR NEGATIVE NEGATIVE Final    Comment: (NOTE) The Xpert Xpress SARS-CoV-2/FLU/RSV assay is intended as an aid in  the diagnosis of influenza  from Nasopharyngeal swab specimens and  should not be used as a sole basis for treatment. Nasal washings and  aspirates are unacceptable for Xpert Xpress SARS-CoV-2/FLU/RSV  testing.  Fact Sheet for Patients: PinkCheek.be  Fact Sheet for Healthcare Providers: GravelBags.it  This test is not yet approved or cleared by the [place] FDA and  has been authorized for detection and/or diagnosis of SARS-CoV-2 by  FDA under an Emergency Use Authorization (EUA). This EUA will remain  in effect (meaning this test can be used) for the duration of the  Covid-19 declaration under Section 564(b)(1) of the Act, 21  U.S.C. section 360bbb-3(b)(1), unless the authorization is  terminated or revoked. Performed at [facility], [place] [address] [place], [place] [address]   Urine culture     Status: Abnormal   Collection Time: 09/22/20  2:59 AM   Specimen: In/Out Cath Urine  Result Value Ref Range Status   Specimen Description IN/OUT CATH URINE  Final   Special Requests   Final    NONE Performed at [facility], [place] [address]  [place], [place] [address]    Culture MULTIPLE SPECIES PRESENT, SUGGEST RECOLLECTION (A)  Final   Report Status 09/22/2020 FINAL  Final  MRSA PCR Screening     Status: None   Collection Time: 09/22/20  6:42 AM   Specimen: Nasopharyngeal  Result Value Ref Range Status   MRSA by PCR NEGATIVE NEGATIVE Final    Comment:        The GeneXpert MRSA Assay (FDA approved for NASAL specimens only), is one component of a comprehensive MRSA colonization surveillance program. It is not intended to diagnose MRSA infection nor to guide or monitor treatment for MRSA infections. Performed at [facility], [place] [address], [place], [place] [address]   Blood Culture (routine x 2)     Status: None   Collection Time: 09/22/20  6:59 AM   Specimen: BLOOD  Result Value Ref Range Status   Specimen Description BLOOD RIGHT ANTECUBITAL  Final   Special Requests   Final    BOTTLES DRAWN AEROBIC AND ANAEROBIC Blood Culture adequate volume   Culture   Final    NO GROWTH 5 DAYS Performed at [facility], [place] [address] [place], [place] [address]    Report Status 09/27/2020 FINAL  Final         Radiology Studies: CT HEAD WO CONTRAST  Result Date: 09/30/2020 CLINICAL DATA:  Dizziness, headache EXAM: CT HEAD WITHOUT CONTRAST TECHNIQUE: Contiguous axial images were obtained from the base of the skull through the vertex without intravenous contrast. COMPARISON:  None. FINDINGS: Brain: Normal anatomic configuration. Parenchymal volume loss is commensurate with the patient's age. Mild periventricular white matter changes are present likely reflecting the sequela of small vessel ischemia. Probable dilated perivascular space seen within the inferior left basal ganglia. No abnormal intra or extra-axial mass lesion or fluid collection. No abnormal mass effect or midline shift. No evidence of acute intracranial hemorrhage or infarct. Ventricular size is normal. Cerebellum unremarkable. Vascular: No asymmetric  hyperdense vasculature at the skull base. Skull: Intact Sinuses/Orbits: There is mild mucosal thickening within the ethmoid air cells as well as a small air-fluid level within the right frontal sinus in keeping with changes of mild paranasal sinus disease. Remaining paranasal sinuses are clear. Orbits are unremarkable. Other: Mastoid air cells and middle ear cavities are clear. Small left parasagittal posterior scalp hematoma noted. IMPRESSION: Small scalp hematoma. No acute intracranial  abnormality. No calvarial fracture. Mild paranasal sinus disease. Electronically Signed   By: Fidela Salisbury MD   On: 09/30/2020 01:20   DG Chest Port 1 View  Result Date: 09/30/2020 CLINICAL DATA:  Chest pain EXAM: PORTABLE CHEST 1 VIEW COMPARISON:  09/22/2020 FINDINGS: The heart size and mediastinal contours are within normal limits. Both lungs are clear. The visualized skeletal structures are unremarkable. IMPRESSION: No active disease. Electronically Signed   By: Ulyses Jarred M.D.   On: 09/30/2020 00:14   ECHOCARDIOGRAM COMPLETE  Result Date: 09/30/2020    ECHOCARDIOGRAM REPORT   Patient Name:   DESHEA POOLEY Date of Exam: 09/30/2020 Medical Rec #:  [id number]        Height:       64.0 in Accession #:    [id number]       Weight:       98.0 lb Date of Birth:  08-01-1951         BSA:          1.445 m Patient Age:    46 years         BP:           98/59 mmHg Patient Gender: F                HR:           59 bpm. Exam Location:  Inpatient Procedure: 2D Echo, 3D Echo, Cardiac Doppler and Color Doppler Indications:    R55 Syncope  History:        Patient has prior history of Echocardiogram examinations, most                 recent 07/23/2016. Arrythmias:RBBB, Signs/Symptoms:Murmur and                 Syncope; Risk Factors:Dyslipidemia. Tetralogy of Fallot repair                 at age 58. Covid 19 positive.  Sonographer:    Roseanna Rainbow RDCS Referring Phys: [phone] JARED M GARDNER  Sonographer Comments: Technically difficult study  due to poor echo windows. Patient supine. IMPRESSIONS  1. S/P pulmonary valve replacement with 27 mm Trifecta valve 01/2017. Grossly normal, valve is not well visualized. Mean gradient 4 mmHg, Vmax 1.5 m/s, peak gradient 8 mmHg.  2. S/p TV annuloplasty 01/2017. Grossly normal appearance of TV annuloplasty repair. The tricuspid valve is has been repaired/replaced. The tricuspid valve is status post repair with an annuloplasty ring. Tricuspid valve regurgitation is mild to moderate.  3. Right ventricular systolic function is moderately reduced. The right ventricular size is moderately enlarged. There is normal pulmonary artery systolic pressure. The estimated right ventricular systolic pressure is 83.4 mmHg.  4. No residual atrial level shunt s/p ASD repair.  5. No residual VSD s/p repair of Tetralogy of Fallot in childhood.  6. Left ventricular ejection fraction, by estimation, is 60 to 65%. The left ventricle has normal function. The left ventricle has no regional wall motion abnormalities. There is mild left ventricular hypertrophy. Left ventricular diastolic parameters are consistent with Grade I diastolic dysfunction (impaired relaxation).  7. Right atrial size was mildly dilated.  8. The mitral valve is normal in structure. Mild mitral valve regurgitation.  9. The aortic valve is tricuspid. Aortic valve regurgitation is trivial. 10. Aortic dilatation noted. There is mild dilatation of the aortic root, measuring 40 mm. 11. The inferior vena cava is normal in size with greater than 50% respiratory  variability, suggesting right atrial pressure of 3 mmHg. Conclusion(s)/Recommendation(s): No definite echo findings to support an etiology for syncope. FINDINGS  Left Ventricle: Left ventricular ejection fraction, by estimation, is 60 to 65%. The left ventricle has normal function. The left ventricle has no regional wall motion abnormalities. The left ventricular internal cavity size was normal in size. There is  mild left  ventricular hypertrophy. Left ventricular diastolic parameters are consistent with Grade I diastolic dysfunction (impaired relaxation). Right Ventricle: The right ventricular size is moderately enlarged. No increase in right ventricular wall thickness. Right ventricular systolic function is moderately reduced. There is normal pulmonary artery systolic pressure. The tricuspid regurgitant velocity is 2.67 m/s, and with an assumed right atrial pressure of 3 mmHg, the estimated right ventricular systolic pressure is 31.5 mmHg. Left Atrium: Left atrial size was normal in size. Right Atrium: Right atrial size was mildly dilated. Prominent Eustachian valve. Pericardium: There is no evidence of pericardial effusion. Mitral Valve: The mitral valve is normal in structure. Mild mitral valve regurgitation. Tricuspid Valve: S/p TV annuloplasty 01/2017. Grossly normal appearance of TV annuloplasty repair. The tricuspid valve is has been repaired/replaced. Tricuspid valve regurgitation is mild to moderate. No evidence of tricuspid stenosis. The tricuspid valve  is status post repair with an annuloplasty ring. Aortic Valve: The aortic valve is tricuspid. Aortic valve regurgitation is trivial. Aortic valve mean gradient measures 4.0 mmHg. Aortic valve peak gradient measures 7.6 mmHg. Aortic valve area, by VTI measures 3.34 cm. Pulmonic Valve: S/P pulmonary valve replacement with 27 mm Trifecta valve 01/2017. Grossly normal, valve is not well visualized. Mean gradient 4 mmHg, Vmax 1.5 m/s, peak gradient 8 mmHg. The pulmonic valve was not well visualized. Pulmonic valve regurgitation is trivial. No evidence of pulmonic stenosis. Aorta: Aortic dilatation noted. There is mild dilatation of the aortic root, measuring 40 mm. Venous: The inferior vena cava is normal in size with greater than 50% respiratory variability, suggesting right atrial pressure of 3 mmHg. IAS/Shunts: No atrial level shunt detected by color flow Doppler.  LEFT  VENTRICLE PLAX 2D LVIDd:         3.90 cm     Diastology LVIDs:         2.10 cm     LV e' medial:    4.05 cm/s LV PW:         1.20 cm     LV E/e' medial:  13.6 LV IVS:        1.10 cm     LV e' lateral:   10.20 cm/s LVOT diam:     2.40 cm     LV E/e' lateral: 5.4 LV SV:         100 LV SV Index:   69 LVOT Area:     4.52 cm  LV Volumes (MOD) LV vol d, MOD A2C: 54.6 ml LV vol d, MOD A4C: 53.5 ml LV vol s, MOD A2C: 20.4 ml LV vol s, MOD A4C: 17.0 ml LV SV MOD A2C:     34.2 ml LV SV MOD A4C:     53.5 ml LV SV MOD BP:      36.0 ml RIGHT VENTRICLE            IVC RV S prime:     8.06 cm/s  IVC diam: 1.50 cm TAPSE (M-mode): 1.9 cm LEFT ATRIUM           Index       RIGHT ATRIUM  Index LA diam:      3.20 cm 2.21 cm/m  RA Area:     15.00 cm LA Vol (A2C): 12.4 ml 8.58 ml/m  RA Volume:   41.70 ml  28.86 ml/m LA Vol (A4C): 28.4 ml 19.66 ml/m  AORTIC VALVE AV Area (Vmax):    3.31 cm AV Area (Vmean):   3.49 cm AV Area (VTI):     3.34 cm AV Vmax:           138.00 cm/s AV Vmean:          87.300 cm/s AV VTI:            0.299 m AV Peak Grad:      7.6 mmHg AV Mean Grad:      4.0 mmHg LVOT Vmax:         101.00 cm/s LVOT Vmean:        67.300 cm/s LVOT VTI:          0.221 m LVOT/AV VTI ratio: 0.74  AORTA Ao Root diam: 3.80 cm Ao Asc diam:  4.00 cm MITRAL VALVE               TRICUSPID VALVE MV Area (PHT): 3.60 cm    TR Peak grad:   28.5 mmHg MV Decel Time: 211 msec    TR Vmax:        267.00 cm/s MV E velocity: 54.90 cm/s MV A velocity: 65.60 cm/s  SHUNTS MV E/A ratio:  0.84        Systemic VTI:  0.22 m                            Systemic Diam: 2.40 cm Cherlynn Kaiser MD Electronically signed by Cherlynn Kaiser MD Signature Date/Time: 09/30/2020/1:23:55 PM    Final         Scheduled Meds: . amitriptyline  50 mg Oral QHS  . aspirin  81 mg Oral Daily  . cholecalciferol  1,000 Units Oral Daily  . dicyclomine  10 mg Oral TID AC  . donepezil  10 mg Oral QHS  . enoxaparin (LOVENOX) injection  30 mg Subcutaneous Q24H   . levothyroxine  50 mcg Oral Daily  . loratadine  10 mg Oral Daily  . omega-3 acid ethyl esters  2 capsule Oral Daily  . sertraline  100 mg Oral Daily  . simvastatin  10 mg Oral q1800  . sodium chloride flush  3 mL Intravenous Q12H  . triamcinolone ointment  1 application Topical BID   Continuous Infusions: . sodium chloride 75 mL/hr at 09/30/20 1523     LOS: 0 days    Time spent: 39 minutes spent on chart review, discussion with nursing staff, consultants, updating family and interview/physical exam; more than 50% of that time was spent in counseling and/or coordination of care.    Yaslin Kirtley J [place] DO [facility] Available via Epic secure chat 7am-7pm After these hours, please refer to coverage provider listed on [email or web address] 10/01/2020, 12:00 PM

## 2020-10-01 NOTE — Evaluation (Signed)
Occupational Therapy Evaluation Patient Details Name: Michelle [name] MRN: [id number] DOB: December 24, 1950 Today's Date: 10/01/2020    History of Present Illness Michelle [name] is a 69 y.o. female with medical history significant of Tetrology of Fallot s/p surgical repair in 1958, anxiety and depression, HTN. Pt recently admitted 10/7 for AKI, GI symptoms of COVID-19, given MAB, discharged 10/10. Pt presents to ED for near syncope episodes, lightheadedness, continued loose stool.   Clinical Impression   Patient admitted with the above diagnosis.  She presents with mild continued light headedness, and continued loose stools.  Vital signs monitored and WNL's.  She is essentially only limited by her lines and leads.  She is close to, it not at, her stated prior level of function.  She is able to care for herself at a sit/stand level, and is moving well in the room despite all the cords, lines and leads.  No further OT in the acute setting.  No recommended HH OT.  Please allow patient to complete her own ADL be herself, as appropriate.       Follow Up Recommendations  Supervision - Intermittent;No OT follow up    Equipment Recommendations       Recommendations for Other Services       Precautions / Restrictions Precautions Precautions: Fall Restrictions Weight Bearing Restrictions: No      Mobility Bed Mobility Overal bed mobility: Modified Independent Bed Mobility: Supine to Sit;Sit to Supine     Supine to sit: Modified independent (Device/Increase time) Sit to supine: Modified independent (Device/Increase time)      Transfers Overall transfer level: Modified independent Equipment used: None Transfers: Sit to/from [employer] to Stand: Modified independent (Device/Increase time) Stand pivot transfers: Modified independent (Device/Increase time)       General transfer comment: assist with lines and leads    Balance Overall balance assessment: Mild  deficits observed, not formally tested                                         ADL either performed or assessed with clinical judgement   ADL Overall ADL's : At baseline                                       General ADL Comments: Patient appears only limited by lines and leads.  Mild lightheadedness reported.     Vision Baseline Vision/History: Wears glasses Wears Glasses: Reading only Patient Visual Report: No change from baseline       Perception     Praxis      Pertinent Vitals/Pain Pain Assessment: No/denies pain     Hand Dominance Right   Extremity/Trunk Assessment Upper Extremity Assessment Upper Extremity Assessment: Overall WFL for tasks assessed   Lower Extremity Assessment Lower Extremity Assessment: Defer to PT evaluation   Cervical / Trunk Assessment Cervical / Trunk Assessment: Normal   Communication Communication Communication: No difficulties   Cognition Arousal/Alertness: Awake/alert Behavior During Therapy: WFL for tasks assessed/performed Overall Cognitive Status: Within Functional Limits for tasks assessed                                 General Comments: Patient has a history of cognitive impairments per the chart.  ?  undiagnoses psych disorder.   General Comments  BP at conclusion 104/65    Exercises     Shoulder Instructions      Home Living Family/patient expects to be discharged to:: Private residence Living Arrangements: Spouse/significant other Available Help at Discharge: Available 24 hours/day;Friend(s) Type of Home: House       Home Layout: One level     Bathroom Shower/Tub: Teacher, early years/pre: Standard Bathroom Accessibility: No   Home Equipment: Crutches;Cane - single point;Bedside commode;Grab bars - tub/shower   Additional Comments: may have RW - reports someone borrowed      Prior Functioning/Environment          Comments: independent with  ADLs, IADLs, driving, and community ambulation        OT Problem List: Decreased safety awareness      OT Treatment/Interventions:      OT Goals(Current goals can be found in the care plan section) Acute Rehab OT Goals Patient Stated Goal: I'd just like to go home OT Goal Formulation: With patient Time For Goal Achievement: 10/08/20 Potential to Achieve Goals: Good  OT Frequency:     Barriers to D/C:            Co-evaluation              AM-PAC OT "6 Clicks" Daily Activity     Outcome Measure Help from another person eating meals?: None Help from another person taking care of personal grooming?: None Help from another person toileting, which includes using toliet, bedpan, or urinal?: None Help from another person bathing (including washing, rinsing, drying)?: None Help from another person to put on and taking off regular upper body clothing?: A Little Help from another person to put on and taking off regular lower body clothing?: None 6 Click Score: 23   End of Session Nurse Communication: Mobility status  Activity Tolerance: Patient tolerated treatment well Patient left: in chair;with call bell/phone within reach  OT Visit Diagnosis: Unsteadiness on feet (R26.81)                Time: 5374-8270 OT Time Calculation (min): 26 min Charges:  OT General Charges $OT Visit: 1 Visit OT Evaluation $OT Eval Moderate Complexity: 1 Mod  10/01/2020  Rich, OTR/L  Acute Rehabilitation Services  Office:  [phone]   Metta Clines 10/01/2020, 11:01 AM

## 2020-10-01 NOTE — Evaluation (Signed)
Physical Therapy Evaluation Patient Details Name: Michelle [name] MRN: [id number] DOB: 03-10-1951 Today's Date: 10/01/2020   History of Present Illness  Michelle [name] is a 69 y.o. female with medical history significant of Tetrology of Fallot s/p surgical repair in 1958, anxiety and depression, HTN. Pt recently admitted 10/7 for AKI, GI symptoms of COVID-19, given MAB, discharged 10/10. Pt presents to ED 09/29/20 for near syncope episodes, lightheadedness, continued loose stool.  Clinical Impression   Pt admitted with above diagnosis. Patient mobilizing with supervision due to lines. Patient refused ambulation and was tearful during session (having difficulty focusing on tasks). On return to bed, she reported spinning sensation lasting <1 minute. She reports spinning episodes started after falling and hitting her head. Performed left Hallpike-Dix using hospital bed. Patient reporting spinning sensation lasting ~30 seconds, however no nystagmus observed. Performed left [place] with pt reporting less spinning with sit to stand afterwards.  Pt currently with functional limitations due to the deficits listed below (see PT Problem List). Pt may benefit from skilled PT to increase their independence and safety with mobility to allow discharge to the venue listed below.       Follow Up Recommendations Home health PT (for vestibular rehab)    Equipment Recommendations  Rolling walker with 5" wheels (pt wasn't sure if she had RW)    Recommendations for Other Services       Precautions / Restrictions Precautions Precautions: Fall      Mobility  Bed Mobility Overal bed mobility: Modified Independent Bed Mobility: Supine to Sit;Sit to Supine     Supine to sit: Modified independent (Device/Increase time) Sit to supine: Modified independent (Device/Increase time)   General bed mobility comments: incr time and encouragement  Transfers Overall transfer level: Needs assistance Equipment used:  None Transfers: Sit to/from [employer] Sit to Stand: Supervision Stand pivot transfers: Supervision       General transfer comment: assist with lines and leads  Ambulation/Gait             General Gait Details: pt refusing, crying; just wanted to return to bed from [place] Mobility    Modified Rankin (Stroke Patients Only)       Balance Overall balance assessment: Mild deficits observed, not formally tested Sitting-balance support: No upper extremity supported;Feet supported Sitting balance-Leahy Scale: Good     Standing balance support: No upper extremity supported;During functional activity Standing balance-Leahy Scale: Fair Standing balance comment: performed toielting ADLs in standing with supervision for safety due to lines                             Pertinent Vitals/Pain Pain Assessment: No/denies pain    Home Living Family/patient expects to be discharged to:: Private residence Living Arrangements: Spouse/significant other Available Help at Discharge: Available 24 hours/day;Friend(s) Type of Home: House       Home Layout: One level Home Equipment: Crutches;Cane - single point;Bedside commode;Grab bars - tub/shower Additional Comments: may have RW - reports someone borrowed    Prior Function Level of Independence: Independent         Comments: independent with ADLs, IADLs, driving, and community ambulation     Hand Dominance   Dominant Hand: Right    Extremity/Trunk Assessment   Upper Extremity Assessment Upper Extremity Assessment: Defer to OT evaluation    Lower Extremity Assessment Lower Extremity Assessment: Generalized  weakness    Cervical / Trunk Assessment Cervical / Trunk Assessment: Normal  Communication   Communication: No difficulties  Cognition Arousal/Alertness: Awake/alert Behavior During Therapy: Anxious Overall Cognitive Status: No family/caregiver  present to determine baseline cognitive functioning Area of Impairment: Problem solving;Attention;Safety/judgement;Awareness                   Current Attention Level: Sustained (perseverating on RN's attitude and difficult to redirect)     Safety/Judgement: Decreased awareness of safety;Decreased awareness of deficits Awareness: Intellectual Problem Solving: Requires verbal cues;Requires tactile cues;Slow processing General Comments: At times weepy, then angry. Returning to discuss she thinks her RN is mad at her repeatedly thru session. Poor awareness of lines, IV.      General Comments General comments (skin integrity, edema, etc.): On return to bed, pt reported spinning that she has had since fall and hit her head    Exercises     Assessment/Plan    PT Assessment Patient needs continued PT services  PT Problem List Decreased strength;Decreased mobility;Decreased safety awareness;Decreased knowledge of precautions;Decreased activity tolerance;Decreased balance;Decreased knowledge of use of DME;Decreased cognition       PT Treatment Interventions DME instruction;Therapeutic activities;Cognitive remediation;Gait training;Therapeutic exercise;Patient/family education;Balance training;Functional mobility training    PT Goals (Current goals can be found in the Care Plan section)  Acute Rehab PT Goals Patient Stated Goal: I'd just like to go home PT Goal Formulation: With patient Time For Goal Achievement: 10/15/20 Potential to Achieve Goals: Fair    Frequency Min 3X/week   Barriers to discharge        Co-evaluation               AM-PAC PT "6 Clicks" Mobility  Outcome Measure Help needed turning from your back to your side while in a flat bed without using bedrails?: None Help needed moving from lying on your back to sitting on the side of a flat bed without using bedrails?: None Help needed moving to and from a bed to a chair (including a wheelchair)?: A  Little Help needed standing up from a chair using your arms (e.g., wheelchair or bedside chair)?: A Little Help needed to walk in hospital room?: A Little Help needed climbing 3-5 steps with a railing? : A Little 6 Click Score: 20    End of Session   Activity Tolerance: Other (comment) (pt self-limiting) Patient left: in bed;with call bell/phone within reach;with bed alarm set Nurse Communication: Mobility status PT Visit Diagnosis: Unsteadiness on feet (R26.81);Other abnormalities of gait and mobility (R26.89);Dizziness and giddiness (R42)    Time: 5638-9373 PT Time Calculation (min) (ACUTE ONLY): 44 min   Charges:   PT Evaluation $PT Eval Moderate Complexity: 1 Mod           Arby Barrette, PT Pager [phone]   Rexanne Mano 10/01/2020, 5:26 PM

## 2020-10-01 NOTE — Progress Notes (Signed)
CBG 67, gave juice 4oz. Hypoglycemic protocol started.

## 2020-10-01 NOTE — Plan of Care (Signed)

## 2020-10-01 NOTE — Progress Notes (Signed)
Initial Nutrition Assessment  RD working remotely.  DOCUMENTATION CODES:   Underweight  INTERVENTION:   - Liberalize diet to Regular, verbal with readback order placed per MD  - Ensure Enlive po TID, each supplement provides 350 kcal and 20 grams of protein  NUTRITION DIAGNOSIS:   Increased nutrient needs related to acute illness (COVID-19) as evidenced by estimated needs.  GOAL:   Patient will meet greater than or equal to 90% of their needs  MONITOR:   PO intake, Supplement acceptance, Labs, Weight trends, I & O's  REASON FOR ASSESSMENT:   Consult Assessment of nutrition requirement/status  ASSESSMENT:   69 year old female who presented to the ED for near syncopal episodes. Recent admission for AKI and GI symptoms related to COVID-19. PMH of Tetrology of Fallot s/p surgical repair in 1958, anxiety, depression, HTN.   Pt currently on a Heart Healthy diet with no meal completions recorded. Discussed diet liberalization with MD who agreed. Verbal with readback order placed for Regular diet.  Reviewed weight history in chart. Pt with a 3.9 kg weight loss since 04/22/20. This is an 8.1% weight loss in 5 months which is significant for timeframe. Suspect pt with some degree of malnutrition but unable to confirm without NFPE.  Attempted to speak with pt via phone call to room; however, no answer. RD will order oral nutrition supplements to aid pt in meeting kcal and protein needs during admission.  Medications reviewed and include: cholecalciferol, lovaza IVF: NS @ 100 ml/hr  Labs reviewed: 67-103  NUTRITION - FOCUSED PHYSICAL EXAM:  Unable to complete at this time. RD working remotely.  Diet Order:   Diet Order            Diet regular Room service appropriate? Yes; Fluid consistency: Thin  Diet effective now                 EDUCATION NEEDS:   No education needs have been identified at this time  Skin:  Skin Assessment: Reviewed RN Assessment  Last BM:   09/30/20 type 6  Height:   Ht Readings from Last 1 Encounters:  09/30/20 5\' 4"  (1.626 m)    Weight:   Wt Readings from Last 1 Encounters:  10/01/20 44.5 kg    Ideal Body Weight:  54.5 kg  BMI:  Body mass index is 16.86 kg/m.  Estimated Nutritional Needs:   Kcal:  1500-1700  Protein:  65-80 grams  Fluid:  1.5-1.7 L    Gaynell Face, MS, RD, LDN Inpatient Clinical Dietitian Please see AMiON for contact information.

## 2020-10-02 DIAGNOSIS — F411 Generalized anxiety disorder: Secondary | ICD-10-CM | POA: Diagnosis not present

## 2020-10-02 DIAGNOSIS — R42 Dizziness and giddiness: Secondary | ICD-10-CM

## 2020-10-02 DIAGNOSIS — R55 Syncope and collapse: Secondary | ICD-10-CM | POA: Diagnosis not present

## 2020-10-02 DIAGNOSIS — Z8774 Personal history of (corrected) congenital malformations of heart and circulatory system: Secondary | ICD-10-CM | POA: Diagnosis not present

## 2020-10-02 DIAGNOSIS — I9589 Other hypotension: Secondary | ICD-10-CM | POA: Diagnosis not present

## 2020-10-02 LAB — CBC
HCT: 30.5 % — ABNORMAL LOW (ref 36.0–46.0)
Hemoglobin: 9.6 g/dL — ABNORMAL LOW (ref 12.0–15.0)
MCH: 29.5 pg (ref 26.0–34.0)
MCHC: 31.5 g/dL (ref 30.0–36.0)
MCV: 93.8 fL (ref 80.0–100.0)
Platelets: 335 10*3/uL (ref 150–400)
RBC: 3.25 MIL/uL — ABNORMAL LOW (ref 3.87–5.11)
RDW: 13.9 % (ref 11.5–15.5)
WBC: 5.1 10*3/uL (ref 4.0–10.5)
nRBC: 0 % (ref 0.0–0.2)

## 2020-10-02 LAB — BASIC METABOLIC PANEL
Anion gap: 7 (ref 5–15)
BUN: 6 mg/dL — ABNORMAL LOW (ref 8–23)
CO2: 22 mmol/L (ref 22–32)
Calcium: 8 mg/dL — ABNORMAL LOW (ref 8.9–10.3)
Chloride: 112 mmol/L — ABNORMAL HIGH (ref 98–111)
Creatinine, Ser: 1.04 mg/dL — ABNORMAL HIGH (ref 0.44–1.00)
GFR, Estimated: 54 mL/min — ABNORMAL LOW (ref >60)
Glucose, Bld: 71 mg/dL (ref 70–99)
Potassium: 3.9 mmol/L (ref 3.5–5.1)
Sodium: 141 mmol/L (ref 135–145)

## 2020-10-02 LAB — GLUCOSE, CAPILLARY
Glucose-Capillary: 66 mg/dL — ABNORMAL LOW (ref 70–99)
Glucose-Capillary: 148 mg/dL — ABNORMAL HIGH (ref 70–99)

## 2020-10-02 LAB — MAGNESIUM: Magnesium: 1.7 mg/dL (ref 1.7–2.4)

## 2020-10-02 MED ORDER — LOPERAMIDE HCL 2 MG PO CAPS: 2.0000 mg | ORAL_CAPSULE | ORAL | 0 refills | Status: DC | PRN

## 2020-10-02 MED ORDER — MECLIZINE HCL 25 MG PO TABS: 25.0000 mg | ORAL_TABLET | Freq: Three times a day (TID) | ORAL | 0 refills | Status: DC | PRN

## 2020-10-02 NOTE — TOC Transition Note (Deleted)
Transition of Care [facility]) - CM/SW Discharge Note   Patient Details  Name: Michelle [name] MRN: [id number] Date of Birth: 10-28-1951  [facility] CM/SW Contact:  Claudie Leach, RN 10/02/2020, 9:30 AM   Clinical Narrative:    Patient to d/c today after readmission.  Patient was set up with [facility] last week- services will resume.  Patient reported last week that had a RW, but has not located it.  RW ordered from [employer] for delivery to room prior to d/c.    Final next level of care: [place] Barriers to Discharge: No Barriers Identified   Patient Goals and CMS Choice Patient states their goals for this hospitalization and ongoing recovery are:: to go home CMS Medicare.gov Compare Post Acute Care list provided to:: Patient Choice offered to / list presented to : Patient   Discharge Plan and Services                DME Arranged: Walker rolling DME Agency: [employer] Date DME Agency Contacted: 10/02/20 Time DME Agency Contacted: [phone] Representative spoke with at DME Agency: Pershing Cox HH Arranged: PT [place]: [place] Date [place]: 10/02/20 Time [place]: 0930 Representative spoke with at [place]: Tommi Rumps

## 2020-10-02 NOTE — TOC Transition Note (Addendum)
Transition of Care [facility]) - CM/SW Discharge Note   Patient Details  Name: Michelle [name] MRN: [id number] Date of Birth: 1951/11/29  [facility] CM/SW Contact:  Claudie Leach, RN 10/02/2020, 9:34 AM   Clinical Narrative:    Patient to d/c home with [facility] PT after readmission.  Patient started with [facility] last week- they will resume services.  Cory with [facility] states the patient is not safe at home and they are pursuing APS as she has limited food and no family/ friends to help.  MD aware.  RW ordered from [employer] for delivery prior to d/c.   AddendumAlvis Lemmings advised of patient d/c with added HHA order.

## 2020-10-02 NOTE — Progress Notes (Signed)
Attempted to return pt boyfriend call. Phone went straight to voicemail, left a voicemail to call back to unit. Will try again in 30 mins.

## 2020-10-02 NOTE — Progress Notes (Addendum)
Went over discharge instructions with the patient. Pt verbalizes understanding that she needs to set up an appointment with her PCP and medications are located at the [facility] on [address]. Pt PIV and tele monitor has been removed. CCMD has been notified of the pt d/c. Purse and clothing is returned to the patient. There is no home medications returned. Transport has been called.

## 2020-10-02 NOTE — Discharge Summary (Signed)
Physician Discharge Summary  MARCHEL FOOTE ZYS:[id number] DOB: [birth year] DOA: 09/29/2020  PCP: [name], MD  Admit date: 09/29/2020 Discharge date: 10/02/2020  Admitted From: Home Disposition: Home  Recommendations for Outpatient Follow-up:  1. Follow up with PCP in 1-2 weeks 2. Meclizine as needed for vertigo 3. Imodium as needed for diarrhea related to Covid-19 viral infection 4. Please obtain BMP/CBC, magnesium level in one week  Home Health: PT/OT/RN/aide Equipment/Devices: Rolling walker  Discharge Condition: Stable CODE STATUS: Full code Diet recommendation: Regular diet  History of present illness:  [name] is a 69 year old female with past medical history notable for tetralogy of Fallot status post surgical repair 1958, anxiety/depression, essential hypertension who presents to the ED with lightheadedness, dizziness, and presyncopal episode.  Patient lives with her significant other, but not home at time and episode unwitnessed.  Patient recently admitted on 09/22/2020 through 09/25/2020 for acute renal failure and viral gastroenteritis related to Covid-19 viral infection.  She was given monoclonal antibody infusion during that hospitalization prior to discharge.  In the ED, patient complained of chest pain associate with anxiety and crying.  Troponin was noted to be 13, within normal limits.  EKGs shows normal sinus rhythm with right bundle branch block and LAFB, which is unchanged from priors. CT head shows small scalp hematoma otherwise no acute intracranial abnormality.  Chest x-ray with no acute cardiopulmonary disease process.  Labs notable for potassium 3.0 and magnesium of 1.5 which were replaced.  Hospitalist service consulted for admission for further evaluation of presyncopal episode.  Hospital course:  Syncopal versus presyncopal episode Patient presenting to the ED with questionable syncopal versus presyncopal episode at home.  Unwitnessed.   Patient states has felt dizzy and weak since recent hospitalization for Covid-19 viral syndrome that was causing some nausea and diarrhea.  Patient received monoclonal antibody infusion during the hospitalization.  Patient is unvaccinated.  TTE with LVEF 60 to 65%, mild LVH, RA mildly dilated, noted TV annuloplasty repair with mild/moderate TR and pulmonary valve repair with trivial vegetation, mild dilation aortic root, 40 mm and normal IVC. Likely etiology of syncope versus presyncope episode from dehydration with poor oral intake and recent Covid-19 viral infection.  Patient was supported with IV fluid hydration with improvement of symptoms.  Seen by physical therapy with concerns of possible vertiginous symptoms; and recommend home health PT with vestibular rehab.  Orthostatic Hypotension Blood pressure 84/62 this morning while in room with tech. Etiology volume depletion in the setting of poor oral intake and recent viral illness.  Not on any antihypertensive regimen.  Supported with IV fluid hydration with improvement.  Continue to encourage increased oral intake following discharge.  Diarrhea Etiology likely secondary to recent Covid-19 viral infection.  Patient is afebrile without leukocytosis. Imodium as needed  Hypokalemia Hypomagnesemia Repleted during hospitalization.  Recommend repeat BMP with magnesium level in next PCP visit.  Anxiety/depression: Elavil 50 mg p.o. nightly  Cognitive impairment Donepezil 10 mg p.o. nightly  Hypothyroidism TSH 1.420 on 09/22/2020. Levothyroxine 50 mcg p.o. daily  Hx tetralogy of Fallot Follows with [facility].  Underwent surgical repair 1958.  TTE 09/30/2020 with LVEF 60 to 65%, moderate LVH, right atrium mildly enlarged, noted TV angioplasty repair with mild/moderate TR, noted PVR with trivial regurgitation, mild dilation aortic root 40 mm, normal IVC. Continue outpatient follow-up with cardiology  Recent Covid-19 viral  syndrome Unvaccinated.  Covid-19 PCR on 09/22/2020.  Recent hospitalization from 09/22/2020 through 09/25/2020.  Only symptoms were nausea and diarrhea.  Patient received monoclonal antibody infusion.  Patient was not hypoxic during present hospitalization..  Encourage Covid-19 vaccination 90 days from antibody infusion.  Social issues: Patient recently discharged home with home health.  Home health agency has concerns regarding her home living environment with lack of food.  When addressed this with the patient, she states "sometimes pills are tight but her boyfriend brings her food every day".  Currently [facility]PS investigating per social work.  Patient wishes to discharge home and declines SNF or ALF placement.  Ordered home health social work to follow outpatient, suspect would likely benefit from further care such as ALF.  APS to continue following.  Discharge Diagnoses:  Active Problems:   Anxiety state   Tetralogy of Fallot s/p repair   Hypotension   Vertigo    Discharge Instructions  Discharge Instructions    Call MD for:  difficulty breathing, headache or visual disturbances   Complete by: As directed    Call MD for:  extreme fatigue   Complete by: As directed    Call MD for:  persistant dizziness or light-headedness   Complete by: As directed    Call MD for:  persistant nausea and vomiting   Complete by: As directed    Call MD for:  severe uncontrolled pain   Complete by: As directed    Call MD for:  temperature >100.4   Complete by: As directed    Diet - low sodium heart healthy   Complete by: As directed    Increase activity slowly   Complete by: As directed      Allergies as of 10/02/2020      Reactions   Aricept [donepezil] Other (See Comments)   bradycardia   Penicillins Rash   Has patient had a PCN reaction causing immediate rash, facial/tongue/throat swelling, SOB or lightheadedness with hypotension: Yes Has patient had a PCN reaction causing severe rash involving  mucus membranes or skin necrosis: No Has patient had a PCN reaction that required hospitalization: No Has patient had a PCN reaction occurring within the last 10 years: No If all of the above answers are "NO", then may proceed with Cephalosporin use.      Medication List    TAKE these medications   albuterol 108 (90 Base) MCG/ACT inhaler Commonly known as: VENTOLIN HFA Inhale 2 puffs into the lungs every 6 (six) hours as needed for wheezing or shortness of breath.   amitriptyline 50 MG tablet Commonly known as: ELAVIL TAKE 1 TABLET BY MOUTH EVERYDAY AT BEDTIME What changed: See the new instructions.   aspirin 81 MG chewable tablet Chew 81 mg by mouth daily.   cetirizine 10 MG tablet Commonly known as: ZYRTEC Take 1 tablet (10 mg total) by mouth 2 (two) times daily.   cholecalciferol 1000 units tablet Commonly known as: VITAMIN D Take 1,000 Units by mouth daily.   dicyclomine 10 MG capsule Commonly known as: BENTYL Take 1 capsule (10 mg total) by mouth 3 (three) times daily before meals.   donepezil 10 MG tablet Commonly known as: ARICEPT Take 10 mg by mouth at bedtime.   Fish Oil 1000 MG Caps Take 2 capsules by mouth daily.   levothyroxine 50 MCG tablet Commonly known as: SYNTHROID TAKE 1 TABLET BY MOUTH EVERY DAY   loperamide 2 MG capsule Commonly known as: IMODIUM Take 1 capsule (2 mg total) by mouth as needed for diarrhea or loose stools.   meclizine 25 MG tablet Commonly known as: ANTIVERT Take 1 tablet (25  mg total) by mouth 3 (three) times daily as needed for dizziness.   sertraline 100 MG tablet Commonly known as: ZOLOFT TAKE 1 TABLET BY MOUTH EVERY DAY   simvastatin 10 MG tablet Commonly known as: ZOCOR TAKE 1 TABLET BY MOUTH EVERY DAY   triamcinolone ointment 0.1 % Commonly known as: KENALOG Apply topically 2 (two) times daily. What changed: how much to take            Durable Medical Equipment  (From admission, onward)         Start      Ordered   10/02/20 0924  For home use only DME Walker rolling  Once       Question Answer Comment  Walker: With [place] Wheels   Patient needs a walker to treat with the following condition Dizziness      10/02/20 0924   10/02/20 0712  For home use only DME Walker rolling  Once       Question Answer Comment  Walker: With [place]   Patient needs a walker to treat with the following condition Gait disturbance      10/02/20 0711          Follow-up Information    [name], MD. Schedule an appointment as soon as possible for a visit in 1 week(s).   Specialty: Internal Medicine Contact information: [place] [address] [phone]              Allergies  Allergen Reactions  . Aricept [Donepezil] Other (See Comments)    bradycardia  . Penicillins Rash    Has patient had a PCN reaction causing immediate rash, facial/tongue/throat swelling, SOB or lightheadedness with hypotension: Yes Has patient had a PCN reaction causing severe rash involving mucus membranes or skin necrosis: No Has patient had a PCN reaction that required hospitalization: No Has patient had a PCN reaction occurring within the last 10 years: No If all of the above answers are "NO", then may proceed with Cephalosporin use.     Consultations:  None   Procedures/Studies: DG Thoracic Spine 2 View  Result Date: 09/25/2020 CLINICAL DATA:  Pain status post fall EXAM: THORACIC SPINE 2 VIEWS; LUMBAR SPINE - 2-3 VIEW COMPARISON:  CT dated 09/15/2019 FINDINGS: Multilevel degenerative changes are noted throughout the visualized lumbar spine, greatest at the L5-S1 level. There is facet arthrosis in the lower lumbar segments. There is grade 1 anterolisthesis of L4 on L5, presumably degenerative in etiology. There is osteopenia which limits detection of nondisplaced fractures. Atherosclerotic changes are noted of the thoracic aorta. There are old right-sided rib fractures. No  definite acute compression fracture involving the thoracic spine. IMPRESSION: 1. No acute fracture identified involving the thoracic or lumbar spine. 2. Multilevel degenerative changes of the lumbar spine, greatest at the L5-S1 level. 3. Grade 1 anterolisthesis of L4 on L5, presumably degenerative in etiology. Electronically Signed   By: Constance Holster M.D.   On: 09/25/2020 23:34   DG Lumbar Spine 2-3 Views  Result Date: 09/25/2020 CLINICAL DATA:  Pain status post fall EXAM: THORACIC SPINE 2 VIEWS; LUMBAR SPINE - 2-3 VIEW COMPARISON:  CT dated 09/15/2019 FINDINGS: Multilevel degenerative changes are noted throughout the visualized lumbar spine, greatest at the L5-S1 level. There is facet arthrosis in the lower lumbar segments. There is grade 1 anterolisthesis of L4 on L5, presumably degenerative in etiology. There is osteopenia which limits detection of nondisplaced fractures. Atherosclerotic changes are noted of the  thoracic aorta. There are old right-sided rib fractures. No definite acute compression fracture involving the thoracic spine. IMPRESSION: 1. No acute fracture identified involving the thoracic or lumbar spine. 2. Multilevel degenerative changes of the lumbar spine, greatest at the L5-S1 level. 3. Grade 1 anterolisthesis of L4 on L5, presumably degenerative in etiology. Electronically Signed   By: Constance Holster M.D.   On: 09/25/2020 23:34   CT HEAD WO CONTRAST  Result Date: 09/30/2020 CLINICAL DATA:  Dizziness, headache EXAM: CT HEAD WITHOUT CONTRAST TECHNIQUE: Contiguous axial images were obtained from the base of the skull through the vertex without intravenous contrast. COMPARISON:  None. FINDINGS: Brain: Normal anatomic configuration. Parenchymal volume loss is commensurate with the patient's age. Mild periventricular white matter changes are present likely reflecting the sequela of small vessel ischemia. Probable dilated perivascular space seen within the inferior left basal  ganglia. No abnormal intra or extra-axial mass lesion or fluid collection. No abnormal mass effect or midline shift. No evidence of acute intracranial hemorrhage or infarct. Ventricular size is normal. Cerebellum unremarkable. Vascular: No asymmetric hyperdense vasculature at the skull base. Skull: Intact Sinuses/Orbits: There is mild mucosal thickening within the ethmoid air cells as well as a small air-fluid level within the right frontal sinus in keeping with changes of mild paranasal sinus disease. Remaining paranasal sinuses are clear. Orbits are unremarkable. Other: Mastoid air cells and middle ear cavities are clear. Small left parasagittal posterior scalp hematoma noted. IMPRESSION: Small scalp hematoma. No acute intracranial abnormality. No calvarial fracture. Mild paranasal sinus disease. Electronically Signed   By: Fidela Salisbury MD   On: 09/30/2020 01:20   CT Head Wo Contrast  Result Date: 09/25/2020 CLINICAL DATA:  Slipped and fall backwards, posterior scalp laceration EXAM: CT HEAD WITHOUT CONTRAST CT CERVICAL SPINE WITHOUT CONTRAST TECHNIQUE: Multidetector CT imaging of the head and cervical spine was performed following the standard protocol without intravenous contrast. Multiplanar CT image reconstructions of the cervical spine were also generated. COMPARISON:  MRI 08/22/2017, head and cervical spine CT 06/27/2016 FINDINGS: CT HEAD FINDINGS Brain: No evidence of acute infarction, hemorrhage, hydrocephalus, extra-axial collection, visible mass lesion or mass effect. Symmetric prominence of the ventricles, cisterns and sulci compatible with parenchymal volume loss. Patchy areas of white matter hypoattenuation are most compatible with chronic microvascular angiopathy. Vascular: Atherosclerotic calcification of the carotid siphons and intradural vertebral arteries. No hyperdense vessel. Skull: Posterior parieto-occipital scalp swelling and overlying laceration with a crescentic scalp hematoma  measuring up to 13 mm in maximal thickness. No subjacent calvarial fracture or other acute osseous injury. Sinuses/Orbits: Minimal pneumatized secretions in the frontal sinuses and anterior ethmoids. Remaining paranasal sinuses are predominantly clear. Mastoid air cells are clear. Middle ear cavities are clear. Included orbital structures are unremarkable. Other: Mild bilateral TMJ arthrosis. CT CERVICAL SPINE FINDINGS Alignment: Cervical stabilization collar is absent at the time of examination. Mild anterior and right lateral cervical flexion noted on scout view. Rightward cranial rotation noted as well. Minimal stepwise retrolisthesis C3-C6 is similar to the comparison CT and favored to be degenerative. No evidence of traumatic listhesis. No abnormally widened, perched or jumped facets. Normal alignment of the craniocervical and atlantoaxial articulations accounting for cranial rotation. Skull base and vertebrae: No acute skull base fracture. No vertebral body fracture or height loss. The osseous structures appear diffusely demineralized which may limit detection of small or nondisplaced fractures. No worrisome osseous lesions. Atlantodental arthrosis is mild to moderate. Some additional mild multilevel spondylitic changes as detailed below. Soft tissues and  spinal canal: Insert spinal canal Disc levels: Multilevel intervertebral disc height loss with spondylitic endplate changes. Features are most pronounced C5-C7 with small posterior disc osteophyte complexes. Additional disc desiccation and disc osteophyte complex present at C4-5 as well. These efface the ventral thecal sac but without significant canal narrowing. Some mild multilevel uncinate spurring and facet hypertrophic changes are present as well resulting in at most mild foraminal narrowing, maximal C5-6. Upper chest: Biapical pleuroparenchymal scarring. 4 mm solid nodule seen in the left lung apex is unchanged from 2017 and likely benign, possibly post  infectious or inflammatory. No acute abnormality in the upper chest or imaged lung apices. Other: Cervical carotid atherosclerosis.  Normal thyroid. IMPRESSION: 1. No acute intracranial abnormality. 2. Posterior parieto-occipital scalp swelling and overlying laceration with a crescentic scalp hematoma measuring up to 13 mm in maximal thickness. No subjacent calvarial fracture or other acute osseous injury. 3. No acute fracture or traumatic listhesis of the cervical spine. 4. Mild multilevel spondylitic and facet hypertrophic changes of the cervical spine, most pronounced C5-C7. No significant canal stenosis and at most mild foraminal impingement as described above. 5. Cervical and intracranial atherosclerosis. Electronically Signed   By: Lovena Le M.D.   On: 09/25/2020 15:25   CT Cervical Spine Wo Contrast  Result Date: 09/25/2020 CLINICAL DATA:  Slipped and fall backwards, posterior scalp laceration EXAM: CT HEAD WITHOUT CONTRAST CT CERVICAL SPINE WITHOUT CONTRAST TECHNIQUE: Multidetector CT imaging of the head and cervical spine was performed following the standard protocol without intravenous contrast. Multiplanar CT image reconstructions of the cervical spine were also generated. COMPARISON:  MRI 08/22/2017, head and cervical spine CT 06/27/2016 FINDINGS: CT HEAD FINDINGS Brain: No evidence of acute infarction, hemorrhage, hydrocephalus, extra-axial collection, visible mass lesion or mass effect. Symmetric prominence of the ventricles, cisterns and sulci compatible with parenchymal volume loss. Patchy areas of white matter hypoattenuation are most compatible with chronic microvascular angiopathy. Vascular: Atherosclerotic calcification of the carotid siphons and intradural vertebral arteries. No hyperdense vessel. Skull: Posterior parieto-occipital scalp swelling and overlying laceration with a crescentic scalp hematoma measuring up to 13 mm in maximal thickness. No subjacent calvarial fracture or other  acute osseous injury. Sinuses/Orbits: Minimal pneumatized secretions in the frontal sinuses and anterior ethmoids. Remaining paranasal sinuses are predominantly clear. Mastoid air cells are clear. Middle ear cavities are clear. Included orbital structures are unremarkable. Other: Mild bilateral TMJ arthrosis. CT CERVICAL SPINE FINDINGS Alignment: Cervical stabilization collar is absent at the time of examination. Mild anterior and right lateral cervical flexion noted on scout view. Rightward cranial rotation noted as well. Minimal stepwise retrolisthesis C3-C6 is similar to the comparison CT and favored to be degenerative. No evidence of traumatic listhesis. No abnormally widened, perched or jumped facets. Normal alignment of the craniocervical and atlantoaxial articulations accounting for cranial rotation. Skull base and vertebrae: No acute skull base fracture. No vertebral body fracture or height loss. The osseous structures appear diffusely demineralized which may limit detection of small or nondisplaced fractures. No worrisome osseous lesions. Atlantodental arthrosis is mild to moderate. Some additional mild multilevel spondylitic changes as detailed below. Soft tissues and spinal canal: Insert spinal canal Disc levels: Multilevel intervertebral disc height loss with spondylitic endplate changes. Features are most pronounced C5-C7 with small posterior disc osteophyte complexes. Additional disc desiccation and disc osteophyte complex present at C4-5 as well. These efface the ventral thecal sac but without significant canal narrowing. Some mild multilevel uncinate spurring and facet hypertrophic changes are present as well resulting in  at most mild foraminal narrowing, maximal C5-6. Upper chest: Biapical pleuroparenchymal scarring. 4 mm solid nodule seen in the left lung apex is unchanged from 2017 and likely benign, possibly post infectious or inflammatory. No acute abnormality in the upper chest or imaged lung  apices. Other: Cervical carotid atherosclerosis.  Normal thyroid. IMPRESSION: 1. No acute intracranial abnormality. 2. Posterior parieto-occipital scalp swelling and overlying laceration with a crescentic scalp hematoma measuring up to 13 mm in maximal thickness. No subjacent calvarial fracture or other acute osseous injury. 3. No acute fracture or traumatic listhesis of the cervical spine. 4. Mild multilevel spondylitic and facet hypertrophic changes of the cervical spine, most pronounced C5-C7. No significant canal stenosis and at most mild foraminal impingement as described above. 5. Cervical and intracranial atherosclerosis. Electronically Signed   By: Lovena Le M.D.   On: 09/25/2020 15:25   DG Chest Port 1 View  Result Date: 09/30/2020 CLINICAL DATA:  Chest pain EXAM: PORTABLE CHEST 1 VIEW COMPARISON:  09/22/2020 FINDINGS: The heart size and mediastinal contours are within normal limits. Both lungs are clear. The visualized skeletal structures are unremarkable. IMPRESSION: No active disease. Electronically Signed   By: Ulyses Jarred M.D.   On: 09/30/2020 00:14   DG Chest Port 1 View  Result Date: 09/22/2020 CLINICAL DATA:  Chest pain EXAM: PORTABLE CHEST 1 VIEW COMPARISON:  January 07, 2019 FINDINGS: Heart size is stable from prior study. The patient has undergone prior valve replacement x2. The pulmonary arteries appear dilated, similar to prior study. There is no pneumothorax. No large pleural effusion. The lungs are hyperexpanded. Emphysematous changes are noted. IMPRESSION: No active disease. Electronically Signed   By: Constance Holster M.D.   On: 09/22/2020 01:17   ECHOCARDIOGRAM COMPLETE  Result Date: 09/30/2020    ECHOCARDIOGRAM REPORT   Patient Name:   Michelle [name] Date of Exam: 09/30/2020 Medical Rec #:  [id number]        Height:       64.0 in Accession #:    [id number]       Weight:       98.0 lb Date of Birth:  08/22/1951         BSA:          1.445 m Patient Age:    4 years          BP:           98/59 mmHg Patient Gender: F                HR:           59 bpm. Exam Location:  Inpatient Procedure: 2D Echo, 3D Echo, Cardiac Doppler and Color Doppler Indications:    R55 Syncope  History:        Patient has prior history of Echocardiogram examinations, most                 recent 07/23/2016. Arrythmias:RBBB, Signs/Symptoms:Murmur and                 Syncope; Risk Factors:Dyslipidemia. Tetralogy of Fallot repair                 at age 54. Covid 19 positive.  Sonographer:    Roseanna Rainbow RDCS Referring Phys: [id number] JARED M GARDNER  Sonographer Comments: Technically difficult study due to poor echo windows. Patient supine. IMPRESSIONS  1. S/P pulmonary valve replacement with 27 mm Trifecta valve 01/2017. Grossly normal, valve is not well visualized. Mean gradient  4 mmHg, Vmax 1.5 m/s, peak gradient 8 mmHg.  2. S/p TV annuloplasty 01/2017. Grossly normal appearance of TV annuloplasty repair. The tricuspid valve is has been repaired/replaced. The tricuspid valve is status post repair with an annuloplasty ring. Tricuspid valve regurgitation is mild to moderate.  3. Right ventricular systolic function is moderately reduced. The right ventricular size is moderately enlarged. There is normal pulmonary artery systolic pressure. The estimated right ventricular systolic pressure is 00.3 mmHg.  4. No residual atrial level shunt s/p ASD repair.  5. No residual VSD s/p repair of Tetralogy of Fallot in childhood.  6. Left ventricular ejection fraction, by estimation, is 60 to 65%. The left ventricle has normal function. The left ventricle has no regional wall motion abnormalities. There is mild left ventricular hypertrophy. Left ventricular diastolic parameters are consistent with Grade I diastolic dysfunction (impaired relaxation).  7. Right atrial size was mildly dilated.  8. The mitral valve is normal in structure. Mild mitral valve regurgitation.  9. The aortic valve is tricuspid. Aortic valve regurgitation is  trivial. 10. Aortic dilatation noted. There is mild dilatation of the aortic root, measuring 40 mm. 11. The inferior vena cava is normal in size with greater than 50% respiratory variability, suggesting right atrial pressure of 3 mmHg. Conclusion(s)/Recommendation(s): No definite echo findings to support an etiology for syncope. FINDINGS  Left Ventricle: Left ventricular ejection fraction, by estimation, is 60 to 65%. The left ventricle has normal function. The left ventricle has no regional wall motion abnormalities. The left ventricular internal cavity size was normal in size. There is  mild left ventricular hypertrophy. Left ventricular diastolic parameters are consistent with Grade I diastolic dysfunction (impaired relaxation). Right Ventricle: The right ventricular size is moderately enlarged. No increase in right ventricular wall thickness. Right ventricular systolic function is moderately reduced. There is normal pulmonary artery systolic pressure. The tricuspid regurgitant velocity is 2.67 m/s, and with an assumed right atrial pressure of 3 mmHg, the estimated right ventricular systolic pressure is 49.1 mmHg. Left Atrium: Left atrial size was normal in size. Right Atrium: Right atrial size was mildly dilated. Prominent Eustachian valve. Pericardium: There is no evidence of pericardial effusion. Mitral Valve: The mitral valve is normal in structure. Mild mitral valve regurgitation. Tricuspid Valve: S/p TV annuloplasty 01/2017. Grossly normal appearance of TV annuloplasty repair. The tricuspid valve is has been repaired/replaced. Tricuspid valve regurgitation is mild to moderate. No evidence of tricuspid stenosis. The tricuspid valve  is status post repair with an annuloplasty ring. Aortic Valve: The aortic valve is tricuspid. Aortic valve regurgitation is trivial. Aortic valve mean gradient measures 4.0 mmHg. Aortic valve peak gradient measures 7.6 mmHg. Aortic valve area, by VTI measures 3.34 cm. 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 Grossly normal, valve is not well visualized. Mean gradient 4 mmHg, Vmax 1.5 m/s, peak gradient 8 mmHg. The pulmonic valve was not well visualized. Pulmonic valve regurgitation is trivial. No evidence of pulmonic stenosis. Aorta: Aortic dilatation noted. There is mild dilatation of the aortic root, measuring 40 mm. Venous: The inferior vena cava is normal in size with greater than 50% respiratory variability, suggesting right atrial pressure of 3 mmHg. IAS/Shunts: No atrial level shunt detected by color flow Doppler.  LEFT VENTRICLE PLAX 2D LVIDd:         3.90 cm     Diastology LVIDs:         2.10 cm     LV e' medial:  4.05 cm/s LV PW:         1.20 cm     LV E/e' medial:  13.6 LV IVS:        1.10 cm     LV e' lateral:   10.20 cm/s LVOT diam:     2.40 cm     LV E/e' lateral: 5.4 LV SV:         100 LV SV Index:   69 LVOT Area:     4.52 cm  LV Volumes (MOD) LV vol d, MOD A2C: 54.6 ml LV vol d, MOD A4C: 53.5 ml LV vol s, MOD A2C: 20.4 ml LV vol s, MOD A4C: 17.0 ml LV SV MOD A2C:     34.2 ml LV SV MOD A4C:     53.5 ml LV SV MOD BP:      36.0 ml RIGHT VENTRICLE            IVC RV S prime:     8.06 cm/s  IVC diam: 1.50 cm TAPSE (M-mode): 1.9 cm LEFT ATRIUM           Index       RIGHT ATRIUM           Index LA diam:      3.20 cm 2.21 cm/m  RA Area:     15.00 cm LA Vol (A2C): 12.4 ml 8.58 ml/m  RA Volume:   41.70 ml  28.86 ml/m LA Vol (A4C): 28.4 ml 19.66 ml/m  AORTIC VALVE AV Area (Vmax):    3.31 cm AV Area (Vmean):   3.49 cm AV Area (VTI):     3.34 cm AV Vmax:           138.00 cm/s AV Vmean:          87.300 cm/s AV VTI:            0.299 m AV Peak Grad:      7.6 mmHg AV Mean Grad:      4.0 mmHg LVOT Vmax:         101.00 cm/s LVOT Vmean:        67.300 cm/s LVOT VTI:          0.221 m LVOT/AV VTI ratio: 0.74  AORTA Ao Root diam: 3.80 cm Ao Asc diam:  4.00 cm MITRAL VALVE               TRICUSPID VALVE MV Area (PHT): 3.60 cm    TR Peak grad:    28.5 mmHg MV Decel Time: 211 msec    TR Vmax:        267.00 cm/s MV E velocity: 54.90 cm/s MV A velocity: 65.60 cm/s  SHUNTS MV E/A ratio:  0.84        Systemic VTI:  0.22 m                            Systemic Diam: 2.40 cm Cherlynn Kaiser MD Electronically signed by Cherlynn Kaiser MD Signature Date/Time: 09/30/2020/1:23:55 PM    Final       Subjective: Patient seen and examined bedside, resting comfortably.  Eating lunch.  Discussed with patient regarding home health agency concerns, lack of food and living environment.  Patient declines any further assistance such as SNF or ALF placement and wishes to discharge home today.  Discussed with patient that she could continue her hospitalization until this is further managed, she declines and wishes to discharge  home this afternoon.  No other questions or concerns at this time.  Denies headache, no dizziness, no chest pain, palpitations, no shortness of breath, no abdominal pain.  No acute events overnight per nursing staff.  Discharge Exam: Vitals:   10/02/20 0254 10/02/20 0940  BP: 105/72 121/65  Pulse: 68 79  Resp: 16 18  Temp: 97.8 F (36.6 C)   SpO2: 96% 98%   Vitals:   10/01/20 1930 10/01/20 1931 10/02/20 0254 10/02/20 0940  BP: 112/68  105/72 121/65  Pulse: 73  68 79  Resp: 16  16 18   Temp:  98.3 F (36.8 C) 97.8 F (36.6 C)   TempSrc:  Oral Oral   SpO2: 99%  96% 98%  Weight:   44.9 kg   Height:        General: Pt is alert, awake, not in acute distress, thin in appearance Cardiovascular: RRR, S1/S2 +, no rubs, no gallops Respiratory: CTA bilaterally, no wheezing, no rhonchi, oxygenating well on room air Abdominal: Soft, NT, ND, bowel sounds + Extremities: no edema, no cyanosis    The results of significant diagnostics from this hospitalization (including imaging, microbiology, ancillary and laboratory) are listed below for reference.     Microbiology: No results found for this or any previous visit (from the past 240  hour(s)).   Labs: BNP (last 3 results) Recent Labs    09/25/20 0500  BNP 161.0*   Basic Metabolic Panel: Recent Labs  Lab 09/26/20 0002 09/29/20 1830 09/29/20 2352 09/30/20 0856 10/01/20 0425 10/02/20 0322  NA 139 140  --  139 140 141  K 4.2 3.0*  --  2.8* 4.6 3.9  CL 102 103  --  104 108 112*  CO2 26 26  --  25 23 22   GLUCOSE 70 84  --  73 79 71  BUN 5* 11  --  9 9 6*  CREATININE 1.01* 1.18*  --  1.02* 0.97 1.04*  CALCIUM 8.1* 8.4*  --  8.2* 8.4* 8.0*  MG  --   --  1.5* 2.2 1.9 1.7   Liver Function Tests: No results for input(s): AST, ALT, ALKPHOS, BILITOT, PROT, ALBUMIN in the last 168 hours. No results for input(s): LIPASE, AMYLASE in the last 168 hours. No results for input(s): AMMONIA in the last 168 hours. CBC: Recent Labs  Lab 09/26/20 0002 09/29/20 1830 10/01/20 0425 10/02/20 0322  WBC 3.7* 6.0 6.0 5.1  NEUTROABS 2.2  --   --   --   HGB 11.1* 10.8* 11.5* 9.6*  HCT 34.6* 33.6* 35.3* 30.5*  MCV 93.0 94.4 94.6 93.8  PLT 171 352 435* 335   Cardiac Enzymes: No results for input(s): CKTOTAL, CKMB, CKMBINDEX, TROPONINI in the last 168 hours. BNP: Invalid input(s): POCBNP CBG: Recent Labs  Lab 10/01/20 0650 10/01/20 0804 10/02/20 0534 10/02/20 0634  GLUCAP 67* 102* 66* 148*   D-Dimer Recent Labs    09/30/20 0642  DDIMER 1.29*   Hgb A1c No results for input(s): HGBA1C in the last 72 hours. Lipid Profile No results for input(s): CHOL, HDL, LDLCALC, TRIG, CHOLHDL, LDLDIRECT in the last 72 hours. Thyroid function studies No results for input(s): TSH, T4TOTAL, T3FREE, THYROIDAB in the last 72 hours.  Invalid input(s): FREET3 Anemia work up No results for input(s): VITAMINB12, FOLATE, FERRITIN, TIBC, IRON, RETICCTPCT in the last 72 hours. Urinalysis    Component Value Date/Time   COLORURINE YELLOW 09/30/2020 [place] 09/30/2020 0232   LABSPEC 1.014 09/30/2020 0232  PHURINE 6.0 09/30/2020 0232   GLUCOSEU NEGATIVE 09/30/2020  0232   GLUCOSEU NEGATIVE 07/29/2019 1537   HGBUR NEGATIVE 09/30/2020 [place] 09/30/2020 0232   BILIRUBINUR NEG 12/24/2012 1458   KETONESUR 5 (A) 09/30/2020 0232   PROTEINUR NEGATIVE 09/30/2020 0232   UROBILINOGEN 0.2 07/29/2019 1537   NITRITE NEGATIVE 09/30/2020 0232   LEUKOCYTESUR NEGATIVE 09/30/2020 0232   Sepsis Labs Invalid input(s): PROCALCITONIN,  WBC,  LACTICIDVEN Microbiology No results found for this or any previous visit (from the past 240 hour(s)).   Time coordinating discharge: Over 30 minutes  SIGNED:   Donnamarie Poag [place] DO  [facility] 10/02/2020, 1:33 PM

## 2020-10-02 NOTE — Progress Notes (Signed)
Spoke to patient's boyfriend and provided him an update on the patient's current condition. Boyfriend has concerns about discharge - wanting to have [place] CMT/NT at home for the patient. Pt's boyfriend would like an update from CM for any resources and from the MD.

## 2020-10-02 NOTE — Discharge Instructions (Signed)
Near-Syncope Near-syncope is when you suddenly get weak or dizzy, or you feel like you might pass out (faint). This may also be called presyncope. This is due to a lack of blood flow to the brain. During an episode of near-syncope, you may:  Feel dizzy, weak, or light-headed.  Feel sick to your stomach (nauseous).  See all white or all black.  See spots.  Have cold, clammy skin. This condition is caused by a sudden decrease in blood flow to the brain. This decrease can result from various causes, but most of those causes are not dangerous. However, near-syncope may be a sign of a serious medical problem, so it is important to seek medical care. Follow these instructions at home: Medicines  Take over-the-counter and prescription medicines only as told by your doctor.  If you are taking blood pressure or heart medicine, get up slowly and spend many minutes getting ready to sit and then stand. This can help with dizziness. General instructions  Be aware of any changes in your symptoms.  Talk with your doctor about your symptoms. You may need to have testing to find the cause of your near-syncope.  If you start to feel like you might pass out, lie down right away. Raise (elevate) your feet above the level of your heart. Breathe deeply and steadily. Wait until all of the symptoms are gone.  Have someone stay with you until you feel stable.  Do not drive, use machinery, or play sports until your doctor says it is okay.  Drink enough fluid to keep your pee (urine) pale yellow.  Keep all follow-up visits as told by your doctor. This is important. Get help right away if you:  Have a seizure.  Have pain in your: ? Chest. ? Belly (abdomen). ? Back.  Faint once or more than once.  Have a very bad headache.  Are bleeding from your mouth or butt.  Have black or tarry poop (stool).  Have a very fast or uneven heartbeat (palpitations).  Are mixed up (confused).  Have trouble  walking.  Are very weak.  Have trouble seeing. These symptoms may be an emergency. Do not wait to see if the symptoms will go away. Get medical help right away. Call your local emergency services (911 in the [place] Do not drive yourself to the hospital. Summary  Near-syncope is when you suddenly get weak or dizzy, or you feel like you might pass out (faint).  This condition is caused by a lack of blood flow to the brain.  Near-syncope may be a sign of a serious medical problem, so it is important to seek medical care. This information is not intended to replace advice given to you by your health care provider. Make sure you discuss any questions you have with your health care provider. Document Revised: 03/27/2019 Document Reviewed: 10/22/2018 Elsevier Patient Education  [place].  Vertigo Vertigo is the feeling that you or the things around you are moving when they are not. This feeling can come and go at any time. Vertigo often goes away on its own. This condition can be dangerous if it happens when you are doing activities like driving or working with machines. Your doctor will do tests to find the cause of your vertigo. These tests will also help your doctor decide on the best treatment for you. Follow these instructions at home: Eating and drinking      Drink enough fluid to keep your pee (urine) pale yellow.  Do not drink alcohol. Activity  Return to your normal activities as told by your doctor. Ask your doctor what activities are safe for you.  In the morning, first sit up on the side of the bed. When you feel okay, stand slowly while you hold onto something until you know that your balance is fine.  Move slowly. Avoid sudden body or head movements or certain positions, as told by your doctor.  Use a cane if you have trouble standing or walking.  Sit down right away if you feel dizzy.  Avoid doing any tasks or activities that can cause danger to you or  others if you get dizzy.  Avoid bending down if you feel dizzy. Place items in your home so that they are easy for you to reach without leaning over.  Do not drive or use heavy machinery if you feel dizzy. General instructions  Take over-the-counter and prescription medicines only as told by your doctor.  Keep all follow-up visits as told by your doctor. This is important. Contact a doctor if:  Your medicine does not help your vertigo.  You have a fever.  Your problems get worse or you have new symptoms.  Your family or friends see changes in your behavior.  The feeling of being sick to your stomach gets worse.  Your vomiting gets worse.  You lose feeling (have numbness) in part of your body.  You feel prickling and tingling in a part of your body. Get help right away if:  You have trouble moving or talking.  You are always dizzy.  You pass out (faint).  You get very bad headaches.  You feel weak in your hands, arms, or legs.  You have changes in your hearing.  You have changes in how you see (vision).  You get a stiff neck.  Bright light starts to bother you. Summary  Vertigo is the feeling that you or the things around you are moving when they are not.  Your doctor will do tests to find the cause of your vertigo.  You may be told to avoid some tasks, positions, or movements.  Contact a doctor if your medicine is not helping, or if you have a fever, new symptoms, or a change in behavior.  Get help right away if you get very bad headaches, or if you have changes in how you speak, hear, or see. This information is not intended to replace advice given to you by your health care provider. Make sure you discuss any questions you have with your health care provider. Document Revised: 10/27/2018 Document Reviewed: 10/27/2018 Elsevier Patient Education  [place] if You Are Sick If you are sick with COVID-19 or think  you might have COVID-19, follow the steps below to care for yourself and to help protect other people in your home and community. Stay home except to get medical care.  Stay home. Most people with COVID-19 have mild illness and are able to recover at home without medical care. Do not leave your home, except to get medical care. Do not visit public areas.  Take care of yourself. Get rest and stay hydrated. Take over-the-counter medicines, such as acetaminophen, to help you feel better.  Stay in touch with your doctor. Call before you get medical care. Be sure to get care if you have trouble breathing, or have any other emergency warning signs, or if you think it is an emergency.  Avoid  public transportation, ride-sharing, or taxis. Separate yourself from other people and pets in your home.  As much as possible, stay in a specific room and away from other people and pets in your home. Also, you should use a separate bathroom, if available. If you need to be around other people or animals in or outside of the home, wear a mask. ? See COVID-19 and Animals if you have questions about USFirm.ch. ? Additional guidance is available for those living in close quarters. (http://www.turner-rogers.com/.html) and shared housing (TVStereos.ch). Monitor your symptoms.  Symptoms of COVID-19 include fever, cough, and shortness of breath but other symptoms may be present as well.  Follow care instructions from your healthcare provider and local health department. Your local health authorities will give instructions on checking your symptoms and reporting information. When to Seek Emergency Medical Attention Look for emergency warning signs* for COVID-19. If someone is showing any of these signs, seek emergency medical care  immediately:  Trouble breathing  Persistent pain or pressure in the chest  New confusion  Bluish lips or face  Inability to wake or stay awake *This list is not all possible symptoms. Please call your medical provider for any other symptoms that are severe or concerning to you. Call 911 or call ahead to your local emergency facility: Notify the operator that you are seeking care for someone who has or may have COVID-19. Call ahead before visiting your doctor.  Call ahead. Many medical visits for routine care are being postponed or done by phone or telemedicine.  If you have a medical appointment that cannot be postponed, call your doctor's office, and tell them you have or may have COVID-19. If you are sick, wear a mask over your nose and mouth.  You should wear a mask over your nose and mouth if you must be around other people or animals, including pets (even at home).  You don't need to wear the mask if you are alone. If you can't put on a mask (because of trouble breathing for example), cover your coughs and sneezes in some other way. Try to stay at least 6 feet away from other people. This will help protect the people around you.  Masks should not be placed on young children under age 27 years, anyone who has trouble breathing, or anyone who is not able to remove the mask without help. Note: During the COVID-19 pandemic, medical grade facemasks are reserved for healthcare workers and some first responders. You may need to make a mask using a scarf or bandana. Cover your coughs and sneezes.  Cover your mouth and nose with a tissue when you cough or sneeze.  Throw used tissues in a lined trash can.  Immediately wash your hands with soap and water for at least 20 seconds. If soap and water are not available, clean your hands with an alcohol-based hand sanitizer that contains at least 60% alcohol. Clean your hands often.  Wash your hands often with soap and water for at least 20  seconds. This is especially important after blowing your nose, coughing, or sneezing; going to the bathroom; and before eating or preparing food.  Use hand sanitizer if soap and water are not available. Use an alcohol-based hand sanitizer with at least 60% alcohol, covering all surfaces of your hands and rubbing them together until they feel dry.  Soap and water are the best option, especially if your hands are visibly dirty.  Avoid touching your eyes, nose, and mouth with unwashed  hands. Avoid sharing personal household items.  Do not share dishes, drinking glasses, cups, eating utensils, towels, or bedding with other people in your home.  Wash these items thoroughly after using them with soap and water or put them in the dishwasher. Clean all "high-touch" surfaces everyday.  Clean and disinfect high-touch surfaces in your "sick room" and bathroom. Let someone else clean and disinfect surfaces in common areas, but not your bedroom and bathroom.  If a caregiver or other person needs to clean and disinfect a sick person's bedroom or bathroom, they should do so on an as-needed basis. The caregiver/other person should wear a mask and wait as long as possible after the sick person has used the bathroom. High-touch surfaces include phones, remote controls, counters, tabletops, doorknobs, bathroom fixtures, toilets, keyboards, tablets, and bedside tables.  Clean and disinfect areas that may have blood, stool, or body fluids on them.  Use household cleaners and disinfectants. Clean the area or item with soap and water or another detergent if it is dirty. Then use a household disinfectant. ? Be sure to follow the instructions on the label to ensure safe and effective use of the product. Many products recommend keeping the surface wet for several minutes to ensure germs are killed. Many also recommend precautions such as wearing gloves and making sure you have good ventilation during use of the  product. ? Most EPA-registered household disinfectants should be effective. When you can be around others after you had or likely had COVID-19 When you can be around others (end home isolation) depends on different factors for different situations.  I think or know I had COVID-19, and I had symptoms ? You can be with others after  24 hours with no fever AND  Symptoms improved AND  10 days since symptoms first appeared ? Depending on your healthcare provider's advice and availability of testing, you might get tested to see if you still have COVID-19. If you will be tested, you can be around others when you have no fever, symptoms have improved, and you receive two negative test results in a row, at least 24 hours apart.  I tested positive for COVID-19 but had no symptoms ? If you continue to have no symptoms, you can be with others after:  10 days have passed since test ? Depending on your healthcare provider's advice and availability of testing, you might get tested to see if you still have COVID-19. If you will be tested, you can be around others after you receive two negative test results in a row, at least 24 hours apart. ? If you develop symptoms after testing positive, follow the guidance above for "I think or know I had COVID, and I had symptoms." [email or web address] 07/28/2019 This information is not intended to replace advice given to you by your health care provider. Make sure you discuss any questions you have with your health care provider. Document Revised: 08/13/2019 Document Reviewed: 06/16/2019 Elsevier Patient Education  [place].

## 2020-10-03 DIAGNOSIS — M810 Age-related osteoporosis without current pathological fracture: Secondary | ICD-10-CM | POA: Diagnosis not present

## 2020-10-03 DIAGNOSIS — F411 Generalized anxiety disorder: Secondary | ICD-10-CM | POA: Diagnosis not present

## 2020-10-03 DIAGNOSIS — D696 Thrombocytopenia, unspecified: Secondary | ICD-10-CM | POA: Diagnosis not present

## 2020-10-03 DIAGNOSIS — I959 Hypotension, unspecified: Secondary | ICD-10-CM | POA: Diagnosis not present

## 2020-10-03 DIAGNOSIS — J439 Emphysema, unspecified: Secondary | ICD-10-CM | POA: Diagnosis not present

## 2020-10-03 DIAGNOSIS — I129 Hypertensive chronic kidney disease with stage 1 through stage 4 chronic kidney disease, or unspecified chronic kidney disease: Secondary | ICD-10-CM | POA: Diagnosis not present

## 2020-10-03 DIAGNOSIS — U071 COVID-19: Secondary | ICD-10-CM | POA: Diagnosis not present

## 2020-10-03 DIAGNOSIS — N1832 Chronic kidney disease, stage 3b: Secondary | ICD-10-CM | POA: Diagnosis not present

## 2020-10-03 DIAGNOSIS — F32A Depression, unspecified: Secondary | ICD-10-CM | POA: Diagnosis not present

## 2020-10-04 ENCOUNTER — Inpatient Hospital Stay: Admission: RE | Admit: 2020-10-04 | Payer: Medicare HMO | Source: Ambulatory Visit

## 2020-10-04 ENCOUNTER — Telehealth: Payer: Self-pay

## 2020-10-04 ENCOUNTER — Inpatient Hospital Stay: Payer: Medicare HMO | Admitting: Internal Medicine

## 2020-10-04 DIAGNOSIS — U071 COVID-19: Secondary | ICD-10-CM | POA: Diagnosis not present

## 2020-10-04 DIAGNOSIS — I129 Hypertensive chronic kidney disease with stage 1 through stage 4 chronic kidney disease, or unspecified chronic kidney disease: Secondary | ICD-10-CM | POA: Diagnosis not present

## 2020-10-04 DIAGNOSIS — F32A Depression, unspecified: Secondary | ICD-10-CM | POA: Diagnosis not present

## 2020-10-04 DIAGNOSIS — N1832 Chronic kidney disease, stage 3b: Secondary | ICD-10-CM | POA: Diagnosis not present

## 2020-10-04 DIAGNOSIS — D696 Thrombocytopenia, unspecified: Secondary | ICD-10-CM | POA: Diagnosis not present

## 2020-10-04 DIAGNOSIS — I959 Hypotension, unspecified: Secondary | ICD-10-CM | POA: Diagnosis not present

## 2020-10-04 DIAGNOSIS — J439 Emphysema, unspecified: Secondary | ICD-10-CM | POA: Diagnosis not present

## 2020-10-04 DIAGNOSIS — F411 Generalized anxiety disorder: Secondary | ICD-10-CM | POA: Diagnosis not present

## 2020-10-04 DIAGNOSIS — M810 Age-related osteoporosis without current pathological fracture: Secondary | ICD-10-CM | POA: Diagnosis not present

## 2020-10-04 NOTE — Telephone Encounter (Signed)
Transition Care Management Unsuccessful Follow-up Telephone Call  Date of discharge and from where:  10/02/2020 from [facility]  Attempts:  1st Attempt  Reason for unsuccessful TCM follow-up call:  No answer/busy

## 2020-10-05 ENCOUNTER — Inpatient Hospital Stay: Payer: Medicare HMO | Admitting: Internal Medicine

## 2020-10-05 ENCOUNTER — Telehealth: Payer: Self-pay | Admitting: Internal Medicine

## 2020-10-05 ENCOUNTER — Other Ambulatory Visit: Payer: Self-pay

## 2020-10-05 NOTE — Patient Outreach (Signed)
[facility]) Care Management  10/05/2020  ELYSABETH AUST 11-12-1951 [id number]   Referral Date: 10/05/20 Referral Source: [employer] Report Date of Discharge: 10 /17/21  Facility:  [place]: [facility]   Referral received.  No outreach warranted at this time.  Transition of Care calls being completed via EMMI. RN CM will outreach patient for any red flags received.    Plan: RN CM will close case.    Lelar Farewell Durwin Reges, RN, MSN

## 2020-10-05 NOTE — Telephone Encounter (Signed)
Patients significant other is wondering if her visit on the 25th can be an in person visit because of her confusion

## 2020-10-05 NOTE — Telephone Encounter (Signed)
[name] a physical therapist from [facility] calling stating that the patient was in the hospital twice recently. Patient had a evaluation yesterday for physical therapy. His recommendation for future visits was 2 times a week for 3 week and 1 time a week for 5 weeks for functional mobility.  Also states patient has +1 pitting edema bilateral ankles. The patient reports this is due to rare skin condition flare up and Michelle [name] would like diagnoses information on this.It looks like scally skin and patient states it starts with a B.  Michelle [name] also states she seems chronic cognitive disorder wants more details on what that is. Patient is currently staying at the house by herself during the day and cannot safely manage her medications because of cognition. They currently have a medical social worker working with her to help about the medication issues.  Jims #  [phone]

## 2020-10-05 NOTE — Telephone Encounter (Signed)
Please advise. Pt has an OV today.

## 2020-10-06 ENCOUNTER — Other Ambulatory Visit: Payer: Self-pay

## 2020-10-06 NOTE — Patient Outreach (Signed)
[name]) Care Management  10/06/2020  JILLIEN [name] October 09, 1951 [id number]   EMMI- General Discharge RED ON EMMI ALERT Day # 1 Date: 10/05/20 Red Alert Reason:  Got discharge papers? I Don't Know    Outreach attempt: Telephone call to patient. She reports she is doing well.  Addressed red alert. Patient states she has her discharge papers and that she has her follow up appointments. Patient states she has all her medications and voices no concerns.  Patient declines further follow up at this time.     Plan: RN CM will close case.    Jone Baseman, RN, MSN [facility] Care Management Care Management Coordinator Direct Line [phone] Toll Free: [phone]  Fax: [phone]

## 2020-10-06 NOTE — Progress Notes (Signed)
Spoke with patient, ok to have in-person office visit on 10/25. Last positive COVID test over 14 days ago. Pt was instructed to bring medications to the visit. Pt voiced understanding.

## 2020-10-06 NOTE — Chronic Care Management (AMB) (Signed)
Chronic Care Management Pharmacy  Name: Michelle [name]  MRN: [id number] DOB: 10-Jun-1951   Chief Complaint/ HPI  Michelle [name],  69 y.o. , female presents for their Follow-Up CCM visit with the clinical pharmacist via telephone due to COVID-19 Pandemic.  PCP : [name], MD  Their chronic conditions include: Hypertension, Hyperlipidemia, Chronic Kidney Disease, Hypothyroidism, Anxiety, Osteoporosis and Tetralogy of Fallot s/p repair   Pt reports she is divorced, she used to live in [place], now in a secluded house 10 miles outside [place] with a roommate who is a family friend and autistic. She manages medications herself. She struggles with memory and relayed the same information multiple times during the visit.  Office Visits: 08/15/20 Dr Ronnald Ramp OV: 3 wk hx of abd pain/cramping, alternating constipation/diarhhea. Started dicylcomine 10 mg  07/12/20 Dr Ronnald Ramp OV: BP 172/96, taking OTC pain med, c/o melena and BRPBR x 1 month. Started Edarbyclor 40-12.5 mg.  04/22/20: Patient presented for Jodi Mourning, FNP for rib pain. Mammogram ordered, no medication changes made.  01/13/20: Patient presented to Dr. Ronnald Ramp for follow-up. Betamethsone (pt not using) and torsemide (pt ran out, bp controlled) discontinued.  Sertraline 100 mg restarted for anxiety.   Consult Visit: 09/29/20 hospitalization: lightheadedness, presyncopal episode. Likely d/t dehydration w/ poor oral intake after recent COVID infection. Treated with IV fluids. Home health concerned with lack of food. Declines SNF/ALF. APS following.  09/25/20 ED visit: acute head injury following fall.   09/22/20-09/25/20 hospitalization: COVID positive (GI sx), dehydration, hypotension, AKI. Given monoclonal antibody infusion for COVID. Stopped Edarbyclor at discharge.  01/19/20: Patient presented to Kathlee Nations, NP for congenital heart disease follow-up. Patient without SOB or chest pain. No medication changes made. 01/12/20:  Patient Referred to Dr. Henrene Pastor (GI) for rectal bleeding. Metamucil started, patient instructed to increase fiber intake and water consumption. Colonoscopy ordered. Linaclotide stopped (error)  12/15/20: Patient underwent bladder cystoscopy  Allergies  Allergen Reactions  . Aricept [Donepezil] Other (See Comments)    bradycardia  . Penicillins Rash    Has patient had a PCN reaction causing immediate rash, facial/tongue/throat swelling, SOB or lightheadedness with hypotension: Yes Has patient had a PCN reaction causing severe rash involving mucus membranes or skin necrosis: No Has patient had a PCN reaction that required hospitalization: No Has patient had a PCN reaction occurring within the last 10 years: No If all of the above answers are "NO", then may proceed with Cephalosporin use.     Medications: Outpatient Encounter Medications as of 10/10/2020  Medication Sig  . albuterol (VENTOLIN HFA) 108 (90 Base) MCG/ACT inhaler Inhale 2 puffs into the lungs every 6 (six) hours as needed for wheezing or shortness of breath.  Marland Kitchen amitriptyline (ELAVIL) 50 MG tablet TAKE 1 TABLET BY MOUTH EVERYDAY AT BEDTIME (Patient taking differently: Take 50 mg by mouth at bedtime. )  . aspirin 81 MG chewable tablet Chew 81 mg by mouth daily.   Marland Kitchen dicyclomine (BENTYL) 10 MG capsule Take 1 capsule (10 mg total) by mouth 3 (three) times daily before meals.  . donepezil (ARICEPT) 10 MG tablet TAKE 1 TABLET BY MOUTH EVERY DAY  . levothyroxine (SYNTHROID) 50 MCG tablet TAKE 1 TABLET BY MOUTH EVERY DAY (Patient taking differently: Take 50 mcg by mouth daily. )  . loperamide (IMODIUM) 2 MG capsule Take 1 capsule (2 mg total) by mouth as needed for diarrhea or loose stools.  . meclizine (ANTIVERT) 25 MG tablet Take 1 tablet (25 mg total) by mouth  3 (three) times daily as needed for dizziness.  . sertraline (ZOLOFT) 100 MG tablet TAKE 1 TABLET BY MOUTH EVERY DAY (Patient taking differently: Take 100 mg by mouth daily. )  .  simvastatin (ZOCOR) 10 MG tablet TAKE 1 TABLET BY MOUTH EVERY DAY (Patient taking differently: Take 10 mg by mouth daily. )  . triamcinolone ointment (KENALOG) 0.1 % Apply topically 2 (two) times daily. (Patient taking differently: Apply 1 application topically 2 (two) times daily. )  . cetirizine (ZYRTEC) 10 MG tablet Take 1 tablet (10 mg total) by mouth 2 (two) times daily. (Patient not taking: Reported on 10/10/2020)  . cholecalciferol (VITAMIN D) 1000 UNITS tablet Take 1,000 Units by mouth daily.  (Patient not taking: Reported on 10/10/2020)  . Omega-3 Fatty Acids (FISH OIL) 1000 MG CAPS Take 2 capsules by mouth daily.  (Patient not taking: Reported on 10/10/2020)  . [DISCONTINUED] donepezil (ARICEPT) 10 MG tablet Take 10 mg by mouth at bedtime.   . [DISCONTINUED] levothyroxine (SYNTHROID) 50 MCG tablet Take 1 tablet (50 mcg total) by mouth daily.  . [DISCONTINUED] levothyroxine (SYNTHROID) 50 MCG tablet TAKE 1 TABLET BY MOUTH EVERY DAY   No facility-administered encounter medications on file as of 10/10/2020.     Current Diagnosis/Assessment:    Goals Addressed            This Visit's Progress   . Pharmacy Care Plan       CARE PLAN ENTRY (see longitudinal plan of care for additional care plan information)  Current Barriers:  . Chronic Disease Management support, education, and care coordination needs related to Hypertension, Hyperlipidemia, and Osteoporosis   Hypertension BP Readings from Last 3 Encounters:  04/22/20 128/78  01/13/20 130/80  01/12/20 126/78 .  Pharmacist Clinical Goal(s): o Over the next 30 days, patient will work with PharmD and providers to maintain BP goal <130/80 . Current regimen:  o No medications . Interventions: o Discussed BP goals and benefits of checking BP at home occasionally  . Patient self care activities - Over the next 30 days, patient will: o Check BP occasionally, document, and provide at future appointments o Ensure daily salt  intake < 2300 mg/day  Hyperlipidemia / Cardiovascular risk reduction Lab Results  Component Value Date/Time   LDLCALC 85 01/13/2020 01:44 PM   LDLDIRECT 113 (H) 06/16/2008 08:36 PM .  Pharmacist Clinical Goal(s): o Over the next 30 days, patient will work with PharmD and providers to maintain LDL goal < 100 . Current regimen:  o Simvastatin 10 mg daily  o Aspirin 81 mg daily  o Omega-3 Fatty Acid 1000 mg 2 caps daily . Interventions: o Discussed cholesterol goals and benefits of medication for prevention of heart attack / stroke . Patient self care activities - Over the next 30 days, patient will: o Continue medication as prescribed o Continue low cholesterol diet  Osteoporosis . Pharmacist Clinical Goal(s) o Over the next 30 days, patient will work with PharmD and providers to optimize therapy . Current regimen:  o No medications . Interventions: o Recommend calcium 1200 mg/day and Vitamin D 1000 units/day from diet and/or supplement . Patient self care activities - Over the next 30 days, patient will: o Start calcium-Vitamin D supplement for bone health  Medication management . Pharmacist Clinical Goal(s): o Over the next 30 days, patient will work with PharmD and providers to achieve optimal medication adherence . [facility] . Interventions o Comprehensive medication review performed. o Utilize [facility] for medication synchronization,  packaging and delivery . Patient self care activities - Over the next 30 days, patient will: o Focus on medication adherence by pill box o Take medications as prescribed o Report any questions or concerns to PharmD and/or provider(s)  Please see past updates related to this goal by clicking on the "Past Updates" button in the selected goal       Hypertension   BP goal is:  <130/80  Office blood pressures are  BP Readings from Last 3 Encounters:  10/02/20 121/65  09/26/20 133/70  09/25/20 (!) 111/99   Kidney  Function Lab Results  Component Value Date/Time   CREATININE 1.04 (H) 10/02/2020 03:22 AM   CREATININE 0.97 10/01/2020 04:25 AM   CREATININE 1.03 (H) 07/12/2020 02:38 PM   CREATININE 1.11 (H) 07/25/2016 10:23 AM   GFR 50.71 (L) 01/13/2020 01:44 PM   GFRNONAA 54 (L) 10/02/2020 03:22 AM   GFRNONAA 55 (L) 07/12/2020 02:38 PM   GFRAA 64 07/12/2020 02:38 PM   K 3.9 10/02/2020 03:22 AM   K 4.6 10/01/2020 04:25 AM   Patient checks BP at home infrequently Patient home BP readings are ranging: 120/70  Patient has failed these meds in the past: torsemide, Edarbyclor Patient is currently controlled on the following medications:  . No medications   We discussed diet and exercise extensively; pt brought medications to appt today and Edarbyclor was not included, she is no longer taking it as directed at recent hospital discharge due to low BP.  Pt reports she drinks at least 2 16-oz water bottles per day, as well as coffee, milk, iced tea and juice throughout. Emphasized importance of hydration and adequate oral intake to maintain BP.  Plan  Continue control with diet and exercise   Hyperlipidemia   LDL goal < 100  Lipid Panel     Component Value Date/Time   CHOL 157 01/13/2020 1344   TRIG 139.0 01/13/2020 1344   HDL 44.20 01/13/2020 1344   LDLCALC 85 01/13/2020 1344   LDLDIRECT 113 (H) 06/16/2008 2036    Hepatic Function Latest Ref Rng & Units 09/25/2020 09/22/2020 09/22/2020  Total Protein 6.5 - 8.1 g/dL 4.6(L) 4.6(L) 4.6(L)  Albumin 3.5 - 5.0 g/dL 2.4(L) 2.6(L) 2.6(L)  AST 15 - 41 U/L 25 22 20   ALT 0 - 44 U/L 14 12 11   Alk Phosphatase 38 - 126 U/L 26(L) 31(L) 33(L)  Total Bilirubin 0.3 - 1.2 mg/dL 0.1(L) 0.6 0.6  Bilirubin, Direct 0.0 - 0.2 mg/dL - <0.1 -     The 10-year ASCVD risk score Mikey Bussing DC Jr., et al., 2013) is: 8.3%   Values used to calculate the score:     Age: 89 years     Sex: Female     Is Non-Hispanic African American: No     Diabetic: No     Tobacco smoker:  No     Systolic Blood Pressure: 916 mmHg     Is BP treated: No     HDL Cholesterol: 44.2 mg/dL     Total Cholesterol: 157 mg/dL   Patient has failed these meds in past: n/a Patient is currently controlled on the following medications:  . Aspirin 81 mg daily  . Simvastatin 10 mg daily - afternoon  We discussed:  diet and exercise extensively; cholesterol goals and benefits of statin for ASCVD prevention  Plan  Continue current medications  Osteoporosis   Last DEXA Scan: 01/14/20   T-Score femoral neck: -2.8  T-Score total hip: -2.7  T-Score lumbar spine: -  2.5  T-Score forearm radius: n/a  10-year probability of major osteoporotic fracture: n/a  10-year probability of hip fracture: n/a  No results found for: VD25OH   Patient is a candidate for pharmacologic treatment due to T-Score < -2.5 in femoral neck, T-Score < -2.5 in total hip  and T-Score < -2.5 in lumbar spine  Patient has failed these meds in past: alendronate (09/2016 - 07/2017) Patient is currently controlled on the following medications:  Marland Kitchen Vitamin D 1000 units daily -not taking  We discussed:  Recommend [phone] units of vitamin D daily. Recommend 1200 mg of calcium daily from dietary and supplemental sources.; pt has been offered Prolia in the past but cost was too high for patient. Pt was advised to start calcium-Vit D at last visit but has not, likely due to memory issues.  Plan  Start calcium-Vitamin D supplement   Depression / Anxiety   Depression screen [facility] 2/9 07/12/2020 12/02/2019 07/29/2019  Decreased Interest 0 0 0  Down, Depressed, Hopeless 0 1 1  PHQ - 2 Score 0 1 1  Altered sleeping 1 0 1  Tired, decreased energy 1 1 1   Change in appetite 0 0 0  Feeling bad or failure about yourself  0 0 0  Trouble concentrating 0 1 0  Moving slowly or fidgety/restless - 0 0  Suicidal thoughts 0 0 0  PHQ-9 Score 2 3 3   Difficult doing work/chores Not difficult at all Not difficult at all Not difficult at all   Some recent data might be hidden   GAD7 Score: GAD 7 : Generalized Anxiety Score 12/02/2019 10/29/2017  Nervous, Anxious, on Edge 1 3  Control/stop worrying 0 1  Worry too much - different things 0 2  Trouble relaxing 0 0  Restless 1 1  Easily annoyed or irritable 0 2  Afraid - awful might happen 0 0  Total GAD 7 Score 2 9  Anxiety Difficulty Not difficult at all -   Patient has failed these meds in past: n/a Patient is currently controlled on the following medications:  . Amitriptyline 50 mg QHS  . Sertraline 100 mg daily   We discussed:  Pt reports adherence with medications, denies issues currently. She separates amitriptyline from all her other meds since it is the only one she takes at bedtime.  Plan  Continue current medications  Cognitive impairment   Patient has failed these meds in past: n/a Patient is currently controlled on the following medications:  . Donepezil 10 mg daily  We discussed:  Pt brought all meds to appt and this one was not included. Per fill history it was filled 07/03/20 for 90 ds, pt believes she does have it at home but unclear if she is taking it.  Plan  Continue current medications  IBS   Patient has failed these meds in past: n/a Patient is currently uncontrolled on the following medications:  . Dicyclomine 10 mg TID w/ meals . Loperamide 2 mg PRN diarrhea  We discussed:  Pt is taking dicylomine once a day with her other once-daily medications. She reports she does not usually eat 3 meals a day. She denies issues with cramping currently, but has had more constipation. Suspect she has been putting loperamide in her pill box with other once-daily medications which may explain constipation.  Plan  Advised to separate loperamide from other meds, remove from pill box and only takes as needed    Vaccines   Reviewed and discussed patient's vaccination history.  Immunization History  Administered Date(s) Administered  . Fluad  Quad(high Dose 65+) 08/11/2019  . Influenza Split 12/02/2012  . Influenza Whole 10/21/2007, 11/08/2010  . Influenza, High Dose Seasonal PF 10/11/2015, 08/16/2016, 08/14/2017, 01/28/2019  . Influenza,inj,Quad PF,6+ Mos 09/06/2014, 08/15/2020  . Pneumococcal Conjugate-13 07/30/2016  . Pneumococcal Polysaccharide-23 04/19/2015, 11/07/2016  . Td 04/16/2005  . Tdap 08/24/2015    Plan  Recommended patient receive COVID vaccine > 90 days after monoclonal antibody infusion (09/25/20)  Medication Management   [facility] Uses pill box? Yes  Pt endorses compliance  We discussed: Verbal consent obtained for [facility] enhanced pharmacy services (medication synchronization, adherence packaging, delivery coordination). A medication sync plan was created to allow patient to get all medications delivered once every 30 to 90 days per patient preference. Patient understands they have freedom to choose pharmacy and clinical pharmacist will coordinate care between all prescribers and [facility].    Plan  Utilize [facility] for medication synchronization, packaging and delivery    Follow up: 1 month phone visit  Charlene Brooke, PharmD, [facility] Clinical Pharmacist [facility] [phone]

## 2020-10-08 ENCOUNTER — Other Ambulatory Visit: Payer: Self-pay | Admitting: Neurology

## 2020-10-10 ENCOUNTER — Other Ambulatory Visit: Payer: Self-pay

## 2020-10-10 ENCOUNTER — Ambulatory Visit: Payer: Medicare HMO | Admitting: Pharmacist

## 2020-10-10 DIAGNOSIS — M810 Age-related osteoporosis without current pathological fracture: Secondary | ICD-10-CM

## 2020-10-10 DIAGNOSIS — E785 Hyperlipidemia, unspecified: Secondary | ICD-10-CM

## 2020-10-10 DIAGNOSIS — I10 Essential (primary) hypertension: Secondary | ICD-10-CM

## 2020-10-11 DIAGNOSIS — M810 Age-related osteoporosis without current pathological fracture: Secondary | ICD-10-CM | POA: Diagnosis not present

## 2020-10-11 DIAGNOSIS — J439 Emphysema, unspecified: Secondary | ICD-10-CM | POA: Diagnosis not present

## 2020-10-11 DIAGNOSIS — F32A Depression, unspecified: Secondary | ICD-10-CM | POA: Diagnosis not present

## 2020-10-11 DIAGNOSIS — D696 Thrombocytopenia, unspecified: Secondary | ICD-10-CM | POA: Diagnosis not present

## 2020-10-11 DIAGNOSIS — N1832 Chronic kidney disease, stage 3b: Secondary | ICD-10-CM | POA: Diagnosis not present

## 2020-10-11 DIAGNOSIS — F411 Generalized anxiety disorder: Secondary | ICD-10-CM | POA: Diagnosis not present

## 2020-10-11 DIAGNOSIS — I959 Hypotension, unspecified: Secondary | ICD-10-CM | POA: Diagnosis not present

## 2020-10-11 DIAGNOSIS — U071 COVID-19: Secondary | ICD-10-CM | POA: Diagnosis not present

## 2020-10-11 DIAGNOSIS — I129 Hypertensive chronic kidney disease with stage 1 through stage 4 chronic kidney disease, or unspecified chronic kidney disease: Secondary | ICD-10-CM | POA: Diagnosis not present

## 2020-10-11 NOTE — Patient Instructions (Signed)
Visit Information  Phone number for Pharmacist: [phone]  Goals Addressed            This Visit's Progress   . Pharmacy Care Plan       CARE PLAN ENTRY (see longitudinal plan of care for additional care plan information)  Current Barriers:  . Chronic Disease Management support, education, and care coordination needs related to Hypertension, Hyperlipidemia, and Osteoporosis   Hypertension BP Readings from Last 3 Encounters:  04/22/20 128/78  01/13/20 130/80  01/12/20 126/78 .  Pharmacist Clinical Goal(s): o Over the next 30 days, patient will work with PharmD and providers to maintain BP goal <130/80 . Current regimen:  o No medications . Interventions: o Discussed BP goals and benefits of checking BP at home occasionally  . Patient self care activities - Over the next 30 days, patient will: o Check BP occasionally, document, and provide at future appointments o Ensure daily salt intake < 2300 mg/day  Hyperlipidemia / Cardiovascular risk reduction Lab Results  Component Value Date/Time   LDLCALC 85 01/13/2020 01:44 PM   LDLDIRECT 113 (H) 06/16/2008 08:36 PM .  Pharmacist Clinical Goal(s): o Over the next 30 days, patient will work with PharmD and providers to maintain LDL goal < 100 . Current regimen:  o Simvastatin 10 mg daily  o Aspirin 81 mg daily  o Omega-3 Fatty Acid 1000 mg 2 caps daily . Interventions: o Discussed cholesterol goals and benefits of medication for prevention of heart attack / stroke . Patient self care activities - Over the next 30 days, patient will: o Continue medication as prescribed o Continue low cholesterol diet  Osteoporosis . Pharmacist Clinical Goal(s) o Over the next 30 days, patient will work with PharmD and providers to optimize therapy . Current regimen:  o No medications . Interventions: o Recommend calcium 1200 mg/day and Vitamin D 1000 units/day from diet and/or supplement . Patient self care activities - Over the next  30 days, patient will: o Start calcium-Vitamin D supplement for bone health  Medication management . Pharmacist Clinical Goal(s): o Over the next 30 days, patient will work with PharmD and providers to achieve optimal medication adherence . [facility] . Interventions o Comprehensive medication review performed. o Utilize [facility] for medication synchronization, packaging and delivery . Patient self care activities - Over the next 30 days, patient will: o Focus on medication adherence by pill box o Take medications as prescribed o Report any questions or concerns to PharmD and/or provider(s)  Please see past updates related to this goal by clicking on the "Past Updates" button in the selected goal       Print copy of patient instructions provided.  Telephone follow up appointment with pharmacy team member scheduled for: 1 month  Charlene Brooke, PharmD, [facility] Clinical Pharmacist [facility] [phone]

## 2020-10-12 DIAGNOSIS — F32A Depression, unspecified: Secondary | ICD-10-CM | POA: Diagnosis not present

## 2020-10-12 DIAGNOSIS — I959 Hypotension, unspecified: Secondary | ICD-10-CM | POA: Diagnosis not present

## 2020-10-12 DIAGNOSIS — N1832 Chronic kidney disease, stage 3b: Secondary | ICD-10-CM | POA: Diagnosis not present

## 2020-10-12 DIAGNOSIS — J439 Emphysema, unspecified: Secondary | ICD-10-CM | POA: Diagnosis not present

## 2020-10-12 DIAGNOSIS — I129 Hypertensive chronic kidney disease with stage 1 through stage 4 chronic kidney disease, or unspecified chronic kidney disease: Secondary | ICD-10-CM | POA: Diagnosis not present

## 2020-10-12 DIAGNOSIS — U071 COVID-19: Secondary | ICD-10-CM | POA: Diagnosis not present

## 2020-10-12 DIAGNOSIS — F411 Generalized anxiety disorder: Secondary | ICD-10-CM | POA: Diagnosis not present

## 2020-10-12 DIAGNOSIS — D696 Thrombocytopenia, unspecified: Secondary | ICD-10-CM | POA: Diagnosis not present

## 2020-10-12 DIAGNOSIS — M810 Age-related osteoporosis without current pathological fracture: Secondary | ICD-10-CM | POA: Diagnosis not present

## 2020-10-13 DIAGNOSIS — F32A Depression, unspecified: Secondary | ICD-10-CM | POA: Diagnosis not present

## 2020-10-13 DIAGNOSIS — F411 Generalized anxiety disorder: Secondary | ICD-10-CM | POA: Diagnosis not present

## 2020-10-13 DIAGNOSIS — I959 Hypotension, unspecified: Secondary | ICD-10-CM | POA: Diagnosis not present

## 2020-10-13 DIAGNOSIS — N1832 Chronic kidney disease, stage 3b: Secondary | ICD-10-CM | POA: Diagnosis not present

## 2020-10-13 DIAGNOSIS — J439 Emphysema, unspecified: Secondary | ICD-10-CM | POA: Diagnosis not present

## 2020-10-13 DIAGNOSIS — M810 Age-related osteoporosis without current pathological fracture: Secondary | ICD-10-CM | POA: Diagnosis not present

## 2020-10-13 DIAGNOSIS — U071 COVID-19: Secondary | ICD-10-CM | POA: Diagnosis not present

## 2020-10-13 DIAGNOSIS — D696 Thrombocytopenia, unspecified: Secondary | ICD-10-CM | POA: Diagnosis not present

## 2020-10-13 DIAGNOSIS — I129 Hypertensive chronic kidney disease with stage 1 through stage 4 chronic kidney disease, or unspecified chronic kidney disease: Secondary | ICD-10-CM | POA: Diagnosis not present

## 2020-10-14 DIAGNOSIS — J439 Emphysema, unspecified: Secondary | ICD-10-CM | POA: Diagnosis not present

## 2020-10-14 DIAGNOSIS — M810 Age-related osteoporosis without current pathological fracture: Secondary | ICD-10-CM | POA: Diagnosis not present

## 2020-10-14 DIAGNOSIS — F411 Generalized anxiety disorder: Secondary | ICD-10-CM | POA: Diagnosis not present

## 2020-10-14 DIAGNOSIS — U071 COVID-19: Secondary | ICD-10-CM | POA: Diagnosis not present

## 2020-10-14 DIAGNOSIS — I959 Hypotension, unspecified: Secondary | ICD-10-CM | POA: Diagnosis not present

## 2020-10-14 DIAGNOSIS — N1832 Chronic kidney disease, stage 3b: Secondary | ICD-10-CM | POA: Diagnosis not present

## 2020-10-14 DIAGNOSIS — I129 Hypertensive chronic kidney disease with stage 1 through stage 4 chronic kidney disease, or unspecified chronic kidney disease: Secondary | ICD-10-CM | POA: Diagnosis not present

## 2020-10-14 DIAGNOSIS — F32A Depression, unspecified: Secondary | ICD-10-CM | POA: Diagnosis not present

## 2020-10-14 DIAGNOSIS — D696 Thrombocytopenia, unspecified: Secondary | ICD-10-CM | POA: Diagnosis not present

## 2020-10-18 DIAGNOSIS — U071 COVID-19: Secondary | ICD-10-CM | POA: Diagnosis not present

## 2020-10-18 DIAGNOSIS — N1832 Chronic kidney disease, stage 3b: Secondary | ICD-10-CM | POA: Diagnosis not present

## 2020-10-18 DIAGNOSIS — I129 Hypertensive chronic kidney disease with stage 1 through stage 4 chronic kidney disease, or unspecified chronic kidney disease: Secondary | ICD-10-CM | POA: Diagnosis not present

## 2020-10-18 DIAGNOSIS — F32A Depression, unspecified: Secondary | ICD-10-CM | POA: Diagnosis not present

## 2020-10-18 DIAGNOSIS — D696 Thrombocytopenia, unspecified: Secondary | ICD-10-CM | POA: Diagnosis not present

## 2020-10-18 DIAGNOSIS — M810 Age-related osteoporosis without current pathological fracture: Secondary | ICD-10-CM | POA: Diagnosis not present

## 2020-10-18 DIAGNOSIS — F411 Generalized anxiety disorder: Secondary | ICD-10-CM | POA: Diagnosis not present

## 2020-10-18 DIAGNOSIS — I959 Hypotension, unspecified: Secondary | ICD-10-CM | POA: Diagnosis not present

## 2020-10-18 DIAGNOSIS — J439 Emphysema, unspecified: Secondary | ICD-10-CM | POA: Diagnosis not present

## 2020-10-20 DIAGNOSIS — I959 Hypotension, unspecified: Secondary | ICD-10-CM | POA: Diagnosis not present

## 2020-10-20 DIAGNOSIS — N1832 Chronic kidney disease, stage 3b: Secondary | ICD-10-CM | POA: Diagnosis not present

## 2020-10-20 DIAGNOSIS — U071 COVID-19: Secondary | ICD-10-CM | POA: Diagnosis not present

## 2020-10-20 DIAGNOSIS — M810 Age-related osteoporosis without current pathological fracture: Secondary | ICD-10-CM | POA: Diagnosis not present

## 2020-10-20 DIAGNOSIS — F411 Generalized anxiety disorder: Secondary | ICD-10-CM | POA: Diagnosis not present

## 2020-10-20 DIAGNOSIS — I129 Hypertensive chronic kidney disease with stage 1 through stage 4 chronic kidney disease, or unspecified chronic kidney disease: Secondary | ICD-10-CM | POA: Diagnosis not present

## 2020-10-20 DIAGNOSIS — D696 Thrombocytopenia, unspecified: Secondary | ICD-10-CM | POA: Diagnosis not present

## 2020-10-20 DIAGNOSIS — J439 Emphysema, unspecified: Secondary | ICD-10-CM | POA: Diagnosis not present

## 2020-10-20 DIAGNOSIS — F32A Depression, unspecified: Secondary | ICD-10-CM | POA: Diagnosis not present

## 2020-10-21 ENCOUNTER — Other Ambulatory Visit: Payer: Self-pay | Admitting: Internal Medicine

## 2020-10-21 ENCOUNTER — Other Ambulatory Visit: Payer: Self-pay

## 2020-10-21 DIAGNOSIS — N1832 Chronic kidney disease, stage 3b: Secondary | ICD-10-CM | POA: Diagnosis not present

## 2020-10-21 DIAGNOSIS — U071 COVID-19: Secondary | ICD-10-CM | POA: Diagnosis not present

## 2020-10-21 DIAGNOSIS — I10 Essential (primary) hypertension: Secondary | ICD-10-CM

## 2020-10-21 DIAGNOSIS — I959 Hypotension, unspecified: Secondary | ICD-10-CM | POA: Diagnosis not present

## 2020-10-21 DIAGNOSIS — I129 Hypertensive chronic kidney disease with stage 1 through stage 4 chronic kidney disease, or unspecified chronic kidney disease: Secondary | ICD-10-CM | POA: Diagnosis not present

## 2020-10-21 DIAGNOSIS — M810 Age-related osteoporosis without current pathological fracture: Secondary | ICD-10-CM | POA: Diagnosis not present

## 2020-10-21 DIAGNOSIS — F32A Depression, unspecified: Secondary | ICD-10-CM | POA: Diagnosis not present

## 2020-10-21 DIAGNOSIS — F411 Generalized anxiety disorder: Secondary | ICD-10-CM | POA: Diagnosis not present

## 2020-10-21 DIAGNOSIS — D696 Thrombocytopenia, unspecified: Secondary | ICD-10-CM | POA: Diagnosis not present

## 2020-10-21 DIAGNOSIS — J439 Emphysema, unspecified: Secondary | ICD-10-CM | POA: Diagnosis not present

## 2020-10-24 ENCOUNTER — Ambulatory Visit: Payer: Medicare HMO | Admitting: Internal Medicine

## 2020-10-26 DIAGNOSIS — U071 COVID-19: Secondary | ICD-10-CM | POA: Diagnosis not present

## 2020-10-26 DIAGNOSIS — J439 Emphysema, unspecified: Secondary | ICD-10-CM | POA: Diagnosis not present

## 2020-10-26 DIAGNOSIS — I959 Hypotension, unspecified: Secondary | ICD-10-CM | POA: Diagnosis not present

## 2020-10-26 DIAGNOSIS — M810 Age-related osteoporosis without current pathological fracture: Secondary | ICD-10-CM | POA: Diagnosis not present

## 2020-10-26 DIAGNOSIS — F411 Generalized anxiety disorder: Secondary | ICD-10-CM | POA: Diagnosis not present

## 2020-10-26 DIAGNOSIS — F32A Depression, unspecified: Secondary | ICD-10-CM | POA: Diagnosis not present

## 2020-10-26 DIAGNOSIS — D696 Thrombocytopenia, unspecified: Secondary | ICD-10-CM | POA: Diagnosis not present

## 2020-10-26 DIAGNOSIS — N1832 Chronic kidney disease, stage 3b: Secondary | ICD-10-CM | POA: Diagnosis not present

## 2020-10-26 DIAGNOSIS — I129 Hypertensive chronic kidney disease with stage 1 through stage 4 chronic kidney disease, or unspecified chronic kidney disease: Secondary | ICD-10-CM | POA: Diagnosis not present

## 2020-10-28 DIAGNOSIS — I951 Orthostatic hypotension: Secondary | ICD-10-CM | POA: Diagnosis not present

## 2020-10-28 DIAGNOSIS — I452 Bifascicular block: Secondary | ICD-10-CM | POA: Diagnosis not present

## 2020-10-28 DIAGNOSIS — I1310 Hypertensive heart and chronic kidney disease without heart failure, with stage 1 through stage 4 chronic kidney disease, or unspecified chronic kidney disease: Secondary | ICD-10-CM | POA: Diagnosis not present

## 2020-10-28 DIAGNOSIS — N1832 Chronic kidney disease, stage 3b: Secondary | ICD-10-CM | POA: Diagnosis not present

## 2020-10-28 DIAGNOSIS — F411 Generalized anxiety disorder: Secondary | ICD-10-CM | POA: Diagnosis not present

## 2020-10-28 DIAGNOSIS — J439 Emphysema, unspecified: Secondary | ICD-10-CM | POA: Diagnosis not present

## 2020-10-28 DIAGNOSIS — M810 Age-related osteoporosis without current pathological fracture: Secondary | ICD-10-CM | POA: Diagnosis not present

## 2020-10-28 DIAGNOSIS — F32A Depression, unspecified: Secondary | ICD-10-CM | POA: Diagnosis not present

## 2020-10-28 DIAGNOSIS — U071 COVID-19: Secondary | ICD-10-CM | POA: Diagnosis not present

## 2020-11-01 ENCOUNTER — Ambulatory Visit (INDEPENDENT_AMBULATORY_CARE_PROVIDER_SITE_OTHER): Payer: Medicare HMO | Admitting: Internal Medicine

## 2020-11-01 ENCOUNTER — Other Ambulatory Visit: Payer: Self-pay

## 2020-11-01 ENCOUNTER — Encounter: Payer: Self-pay | Admitting: Internal Medicine

## 2020-11-01 VITALS — BP 132/84 | HR 96 | Temp 98.1°F | Resp 16 | Ht 64.0 in | Wt 90.0 lb

## 2020-11-01 DIAGNOSIS — E440 Moderate protein-calorie malnutrition: Secondary | ICD-10-CM

## 2020-11-01 DIAGNOSIS — E876 Hypokalemia: Secondary | ICD-10-CM

## 2020-11-01 DIAGNOSIS — N1832 Chronic kidney disease, stage 3b: Secondary | ICD-10-CM | POA: Diagnosis not present

## 2020-11-01 DIAGNOSIS — R64 Cachexia: Secondary | ICD-10-CM | POA: Diagnosis not present

## 2020-11-01 DIAGNOSIS — D539 Nutritional anemia, unspecified: Secondary | ICD-10-CM | POA: Diagnosis not present

## 2020-11-01 DIAGNOSIS — I10 Essential (primary) hypertension: Secondary | ICD-10-CM

## 2020-11-01 DIAGNOSIS — Z720 Tobacco use: Secondary | ICD-10-CM | POA: Insufficient documentation

## 2020-11-01 DIAGNOSIS — Z1231 Encounter for screening mammogram for malignant neoplasm of breast: Secondary | ICD-10-CM

## 2020-11-01 LAB — CBC WITH DIFFERENTIAL/PLATELET
Basophils Absolute: 0.1 10*3/uL (ref 0.0–0.1)
Basophils Relative: 1.3 % (ref 0.0–3.0)
Eosinophils Absolute: 0.2 10*3/uL (ref 0.0–0.7)
Eosinophils Relative: 2.7 % (ref 0.0–5.0)
HCT: 41.6 % (ref 36.0–46.0)
Hemoglobin: 13.6 g/dL (ref 12.0–15.0)
Lymphocytes Relative: 30 % (ref 12.0–46.0)
Lymphs Abs: 1.8 10*3/uL (ref 0.7–4.0)
MCHC: 32.7 g/dL (ref 30.0–36.0)
MCV: 93.2 fl (ref 78.0–100.0)
Monocytes Absolute: 0.5 10*3/uL (ref 0.1–1.0)
Monocytes Relative: 8.3 % (ref 3.0–12.0)
Neutro Abs: 3.4 10*3/uL (ref 1.4–7.7)
Neutrophils Relative %: 57.7 % (ref 43.0–77.0)
Platelets: 272 10*3/uL (ref 150.0–400.0)
RBC: 4.47 Mil/uL (ref 3.87–5.11)
RDW: 14.4 % (ref 11.5–15.5)
WBC: 5.9 10*3/uL (ref 4.0–10.5)

## 2020-11-01 LAB — VITAMIN B12: Vitamin B-12: 404 pg/mL (ref 211–911)

## 2020-11-01 LAB — FOLATE: Folate: 14.4 ng/mL (ref >5.9)

## 2020-11-01 LAB — BASIC METABOLIC PANEL
BUN: 9 mg/dL (ref 6–23)
CO2: 28 mEq/L (ref 19–32)
Calcium: 9.9 mg/dL (ref 8.4–10.5)
Chloride: 108 mEq/L (ref 96–112)
Creatinine, Ser: 1.15 mg/dL (ref 0.40–1.20)
GFR: 48.2 mL/min — ABNORMAL LOW (ref >60.00)
Glucose, Bld: 88 mg/dL (ref 70–99)
Potassium: 5.3 mEq/L — ABNORMAL HIGH (ref 3.5–5.1)
Sodium: 148 mEq/L — ABNORMAL HIGH (ref 135–145)

## 2020-11-01 LAB — FERRITIN: Ferritin: 29.5 ng/mL (ref 10.0–291.0)

## 2020-11-01 LAB — MAGNESIUM: Magnesium: 2.2 mg/dL (ref 1.5–2.5)

## 2020-11-01 LAB — CORTISOL: Cortisol, Plasma: 17.1 ug/dL

## 2020-11-01 LAB — IRON: Iron: 83 ug/dL (ref 42–145)

## 2020-11-01 MED ORDER — DRONABINOL 2.5 MG PO CAPS: 2.5000 mg | ORAL_CAPSULE | Freq: Two times a day (BID) | ORAL | 0 refills | Status: DC

## 2020-11-01 NOTE — Patient Instructions (Signed)
Anemia  Anemia is a condition in which you do not have enough red blood cells or hemoglobin. Hemoglobin is a substance in red blood cells that carries oxygen. When you do not have enough red blood cells or hemoglobin (are anemic), your body cannot get enough oxygen and your organs may not work properly. As a result, you may feel very tired or have other problems. What are the causes? Common causes of anemia include:  Excessive bleeding. Anemia can be caused by excessive bleeding inside or outside the body, including bleeding from the intestine or from periods in women.  Poor nutrition.  Long-lasting (chronic) kidney, thyroid, and liver disease.  Bone marrow disorders.  Cancer and treatments for cancer.  HIV (human immunodeficiency virus) and AIDS (acquired immunodeficiency syndrome).  Treatments for HIV and AIDS.  Spleen problems.  Blood disorders.  Infections, medicines, and autoimmune disorders that destroy red blood cells. What are the signs or symptoms? Symptoms of this condition include:  Minor weakness.  Dizziness.  Headache.  Feeling heartbeats that are irregular or faster than normal (palpitations).  Shortness of breath, especially with exercise.  Paleness.  Cold sensitivity.  Indigestion.  Nausea.  Difficulty sleeping.  Difficulty concentrating. Symptoms may occur suddenly or develop slowly. If your anemia is mild, you may not have symptoms. How is this diagnosed? This condition is diagnosed based on:  Blood tests.  Your medical history.  A physical exam.  Bone marrow biopsy. Your health care provider may also check your stool (feces) for blood and may do additional testing to look for the cause of your bleeding. You may also have other tests, including:  Imaging tests, such as a CT scan or MRI.  Endoscopy.  Colonoscopy. How is this treated? Treatment for this condition depends on the cause. If you continue to lose a lot of blood, you may  need to be treated at a hospital. Treatment may include:  Taking supplements of iron, vitamin S31, or folic acid.  Taking a hormone medicine (erythropoietin) that can help to stimulate red blood cell growth.  Having a blood transfusion. This may be needed if you lose a lot of blood.  Making changes to your diet.  Having surgery to remove your spleen. Follow these instructions at home:  Take over-the-counter and prescription medicines only as told by your health care provider.  Take supplements only as told by your health care provider.  Follow any diet instructions that you were given.  Keep all follow-up visits as told by your health care provider. This is important. Contact a health care provider if:  You develop new bleeding anywhere in the body. Get help right away if:  You are very weak.  You are short of breath.  You have pain in your abdomen or chest.  You are dizzy or feel faint.  You have trouble concentrating.  You have bloody or black, tarry stools.  You vomit repeatedly or you vomit up blood. Summary  Anemia is a condition in which you do not have enough red blood cells or enough of a substance in your red blood cells that carries oxygen (hemoglobin).  Symptoms may occur suddenly or develop slowly.  If your anemia is mild, you may not have symptoms.  This condition is diagnosed with blood tests as well as a medical history and physical exam. Other tests may be needed.  Treatment for this condition depends on the cause of the anemia. This information is not intended to replace advice given to you by  your health care provider. Make sure you discuss any questions you have with your health care provider. Document Revised: 11/15/2017 Document Reviewed: 01/04/2017 Elsevier Patient Education  [place].

## 2020-11-01 NOTE — Progress Notes (Signed)
Subjective:  Patient ID: Michelle [name], female    DOB: 05/04/1951  Age: 69 y.o. MRN: [id number]  CC: Anemia  This visit occurred during the SARS-CoV-2 public health emergency.  Safety protocols were in place, including screening questions prior to the visit, additional usage of staff PPE, and extensive cleaning of exam room while observing appropriate contact time as indicated for disinfecting solutions.    HPI Michelle [name] presents for f/up - She was admitted about a month ago for COVID-19 with gastroenteritis.  She tells me she feels better.  She denies cough, abdominal pain, or diarrhea.  She continues to complain of fatigue, weight loss, dizziness, and lightheadedness.  She continues to take potassium and magnesium supplements.  She also experienced some vertigo but that is resolved.  She is no longer taking anything for diarrhea.  She is not aware of any sources of blood loss.  She denies paresthesias.   Admit date: 09/29/2020 Discharge date: 10/02/2020  Admitted From: Home Disposition: Home  Recommendations for Outpatient Follow-up:  1. Follow up with PCP in 1-2 weeks 2. Meclizine as needed for vertigo 3. Imodium as needed for diarrhea related to Covid-19 viral infection 4. Please obtain BMP/CBC, magnesium level in one week  Home Health: PT/OT/RN/aide Equipment/Devices: Rolling walker  Discharge Condition: Stable CODE STATUS: Full code Diet recommendation: Regular diet  History of present illness:  Michelle [name] a 69 year old female with past medical history notable for tetralogy of Fallot status post surgical repair 1958, anxiety/depression, essential hypertension who presents to the ED with lightheadedness, dizziness, and presyncopal episode. Patient lives with her significant other, but not home at time and episode unwitnessed.  Patient recently admitted on 09/22/2020 through 09/25/2020 for acute renal failure and viral gastroenteritis related to  Covid-19 viral infection. She was given monoclonal antibody infusion during that hospitalization prior to discharge.  In the ED, patient complained of chest pain associate with anxiety and crying. Troponin was noted to be 13, within normal limits. EKGs shows normal sinus rhythm with right bundle branch block and LAFB, which is unchanged from priors. CT head shows small scalp hematoma otherwise no acute intracranial abnormality. Chest x-ray with no acute cardiopulmonary disease process. Labs notable for potassium 3.0 and magnesium of 1.5 which were replaced. Hospitalist service consulted for admission for further evaluation of presyncopal episode.  Hospital course:  Syncopal versus presyncopal episode Patient presenting to the ED with questionable syncopal versus presyncopal episode at home. Unwitnessed. Patient states has felt dizzy and weak since recent hospitalization for Covid-19 viral syndrome that was causing some nausea and diarrhea. Patient received monoclonal antibody infusion during the hospitalization. Patient is unvaccinated. TTE with LVEF 60 to 65%, mild LVH, RA mildly dilated, noted TV annuloplasty repair with mild/moderate TR and pulmonary valve repair with trivial vegetation, mild dilation aortic root, 40 mm and normal IVC. Likely etiology of syncope versus presyncope episode from dehydration with poor oral intake and recent Covid-19 viral infection.  Patient was supported with IV fluid hydration with improvement of symptoms.  Seen by physical therapy with concerns of possible vertiginous symptoms; and recommend home health PT with vestibular rehab.  OrthostaticHypotension Blood pressure84/62this morning while in room with tech. Etiologyvolume depletion in the setting of poor oral intake and recent viral illness. Not on any antihypertensive regimen.  Supported with IV fluid hydration with improvement.  Continue to encourage increased oral intake following  discharge.  Diarrhea Etiology likely secondary to recent Covid-19 viral infection. Patient is afebrile without leukocytosis. Imodium  as needed  Hypokalemia Hypomagnesemia Repleted during hospitalization.  Recommend repeat BMP with magnesium level in next PCP visit.  Anxiety/depression: Elavil 50 mg p.o. nightly  Cognitive impairment Donepezil 10 mg p.o. nightly  Hypothyroidism TSH 1.420 on 09/22/2020. Levothyroxine 50 mcg p.o. daily  Hx tetralogy of Fallot Follows with [facility]. Underwent surgical repair 1958. TTE 09/30/2020 with LVEF 60 to 65%, moderate LVH, right atrium mildly enlarged, noted TV angioplasty repair with mild/moderate TR, noted PVR with trivial regurgitation, mild dilation aortic root 40 mm, normal IVC. Continue outpatient follow-up with cardiology  Recent Covid-19 viral syndrome Unvaccinated. Covid-19 PCR on 09/22/2020. Recent hospitalization from 09/22/2020 through 09/25/2020. Only symptoms were nausea and diarrhea. Patient received monoclonal antibody infusion. Patient was not hypoxic during present hospitalization.. Encourage Covid-19 vaccination 90 days from antibody infusion.   Outpatient Medications Prior to Visit  Medication Sig Dispense Refill  . albuterol (VENTOLIN HFA) 108 (90 Base) MCG/ACT inhaler Inhale 2 puffs into the lungs every 6 (six) hours as needed for wheezing or shortness of breath. 6.7 g 0  . amitriptyline (ELAVIL) 50 MG tablet TAKE 1 TABLET BY MOUTH EVERYDAY AT BEDTIME (Patient taking differently: Take 50 mg by mouth at bedtime. ) 90 tablet 1  . aspirin 81 MG chewable tablet Chew 81 mg by mouth daily.     . cetirizine (ZYRTEC) 10 MG tablet Take 1 tablet (10 mg total) by mouth 2 (two) times daily. 180 tablet 1  . cholecalciferol (VITAMIN D) 1000 UNITS tablet Take 1,000 Units by mouth daily.     Marland Kitchen dicyclomine (BENTYL) 10 MG capsule Take 1 capsule (10 mg total) by mouth 3 (three) times daily before meals. 90 capsule 3  .  donepezil (ARICEPT) 10 MG tablet TAKE 1 TABLET BY MOUTH EVERY DAY 90 tablet 1  . levothyroxine (SYNTHROID) 50 MCG tablet TAKE 1 TABLET BY MOUTH EVERY DAY (Patient taking differently: Take 50 mcg by mouth daily. ) 90 tablet 1  . Omega-3 Fatty Acids (FISH OIL) 1000 MG CAPS Take 2 capsules by mouth daily.     . sertraline (ZOLOFT) 100 MG tablet TAKE 1 TABLET BY MOUTH EVERY DAY (Patient taking differently: Take 100 mg by mouth daily. ) 90 tablet 1  . simvastatin (ZOCOR) 10 MG tablet TAKE 1 TABLET BY MOUTH EVERY DAY (Patient taking differently: Take 10 mg by mouth daily. ) 90 tablet 1  . triamcinolone ointment (KENALOG) 0.1 % Apply topically 2 (two) times daily. (Patient taking differently: Apply 1 application topically 2 (two) times daily. ) 453.6 g 2  . loperamide (IMODIUM) 2 MG capsule Take 1 capsule (2 mg total) by mouth as needed for diarrhea or loose stools. 30 capsule 0  . meclizine (ANTIVERT) 25 MG tablet Take 1 tablet (25 mg total) by mouth 3 (three) times daily as needed for dizziness. 30 tablet 0   No facility-administered medications prior to visit.    ROS Review of Systems  Constitutional: Positive for fatigue and unexpected weight change (wt loss). Negative for appetite change, chills, diaphoresis and fever.  HENT: Negative.   Eyes: Negative for visual disturbance.  Respiratory: Negative for cough, chest tightness, shortness of breath and wheezing.   Cardiovascular: Negative for chest pain, palpitations and leg swelling.  Gastrointestinal: Negative for abdominal pain, constipation, diarrhea, nausea and vomiting.  Endocrine: Negative.   Genitourinary: Negative.  Negative for difficulty urinating and dysuria.  Musculoskeletal: Negative.   Skin: Negative.  Negative for color change.  Neurological: Positive for dizziness and light-headedness. Negative for weakness,  numbness and headaches.  Hematological: Negative for adenopathy. Does not bruise/bleed easily.  Psychiatric/Behavioral:  Negative.     Objective:  BP 132/84   Pulse 96   Temp 98.1 F (36.7 C) (Oral)   Resp 16   Ht 5\' 4"  (1.626 m)   Wt 90 lb (40.8 kg)   SpO2 99%   BMI 15.45 kg/m   BP Readings from Last 3 Encounters:  11/01/20 132/84  10/02/20 121/65  09/26/20 133/70    Wt Readings from Last 3 Encounters:  11/01/20 90 lb (40.8 kg)  10/02/20 99 lb (44.9 kg)  09/22/20 98 lb (44.5 kg)    Physical Exam Vitals reviewed.  Constitutional:      Appearance: She is cachectic. She is ill-appearing. She is not toxic-appearing or diaphoretic.  HENT:     Nose: Nose normal.     Mouth/Throat:     Mouth: Mucous membranes are moist.  Eyes:     Conjunctiva/sclera: Conjunctivae normal.  Cardiovascular:     Rate and Rhythm: Normal rate and regular rhythm.     Heart sounds: Murmur heard.  No gallop.   Pulmonary:     Effort: Pulmonary effort is normal.     Breath sounds: No stridor. No wheezing, rhonchi or rales.  Abdominal:     General: Abdomen is flat.     Palpations: There is no mass.     Tenderness: There is no abdominal tenderness. There is no guarding.  Musculoskeletal:        General: Normal range of motion.     Cervical back: Neck supple.  Lymphadenopathy:     Cervical: No cervical adenopathy.  Skin:    General: Skin is warm and dry.  Neurological:     General: No focal deficit present.     Mental Status: She is oriented to person, place, and time. Mental status is at baseline.     Lab Results  Component Value Date   WBC 5.9 11/01/2020   HGB 13.6 11/01/2020   HCT 41.6 11/01/2020   PLT 272.0 11/01/2020   GLUCOSE 88 11/01/2020   CHOL 157 01/13/2020   TRIG 139.0 01/13/2020   HDL 44.20 01/13/2020   LDLDIRECT 113 (H) 06/16/2008   LDLCALC 85 01/13/2020   ALT 14 09/25/2020   AST 25 09/25/2020   NA 148 (H) 11/01/2020   K 5.3 (H) 11/01/2020   CL 108 11/01/2020   CREATININE 1.15 11/01/2020   BUN 9 11/01/2020   CO2 28 11/01/2020   TSH 1.420 09/22/2020   INR 1.3 (H) 09/22/2020     CT HEAD WO CONTRAST  Result Date: 09/30/2020 CLINICAL DATA:  Dizziness, headache EXAM: CT HEAD WITHOUT CONTRAST TECHNIQUE: Contiguous axial images were obtained from the base of the skull through the vertex without intravenous contrast. COMPARISON:  None. FINDINGS: Brain: Normal anatomic configuration. Parenchymal volume loss is commensurate with the patient's age. Mild periventricular white matter changes are present likely reflecting the sequela of small vessel ischemia. Probable dilated perivascular space seen within the inferior left basal ganglia. No abnormal intra or extra-axial mass lesion or fluid collection. No abnormal mass effect or midline shift. No evidence of acute intracranial hemorrhage or infarct. Ventricular size is normal. Cerebellum unremarkable. Vascular: No asymmetric hyperdense vasculature at the skull base. Skull: Intact Sinuses/Orbits: There is mild mucosal thickening within the ethmoid air cells as well as a small air-fluid level within the right frontal sinus in keeping with changes of mild paranasal sinus disease. Remaining paranasal sinuses are clear. Orbits are  unremarkable. Other: Mastoid air cells and middle ear cavities are clear. Small left parasagittal posterior scalp hematoma noted. IMPRESSION: Small scalp hematoma. No acute intracranial abnormality. No calvarial fracture. Mild paranasal sinus disease. Electronically Signed   By: Fidela Salisbury MD   On: 09/30/2020 01:20   DG Chest Port 1 View  Result Date: 09/30/2020 CLINICAL DATA:  Chest pain EXAM: PORTABLE CHEST 1 VIEW COMPARISON:  09/22/2020 FINDINGS: The heart size and mediastinal contours are within normal limits. Both lungs are clear. The visualized skeletal structures are unremarkable. IMPRESSION: No active disease. Electronically Signed   By: Ulyses Jarred M.D.   On: 09/30/2020 00:14   ECHOCARDIOGRAM COMPLETE  Result Date: 09/30/2020    ECHOCARDIOGRAM REPORT   Patient Name:   Michelle [name] Date of  Exam: 09/30/2020 Medical Rec #:  [id number]        Height:       64.0 in Accession #:    [id number]       Weight:       98.0 lb Date of Birth:  05-04-1951         BSA:          1.445 m Patient Age:    51 years         BP:           98/59 mmHg Patient Gender: F                HR:           59 bpm. Exam Location:  Inpatient Procedure: 2D Echo, 3D Echo, Cardiac Doppler and Color Doppler Indications:    R55 Syncope  History:        Patient has prior history of Echocardiogram examinations, most                 recent 07/23/2016. Arrythmias:RBBB, Signs/Symptoms:Murmur and                 Syncope; Risk Factors:Dyslipidemia. Tetralogy of Fallot repair                 at age 61. Covid 19 positive.  Sonographer:    Roseanna Rainbow RDCS Referring Phys: [phone] JARED M GARDNER  Sonographer Comments: Technically difficult study due to poor echo windows. Patient supine. IMPRESSIONS  1. S/P pulmonary valve replacement with 27 mm Trifecta valve 01/2017. Grossly normal, valve is not well visualized. Mean gradient 4 mmHg, Vmax 1.5 m/s, peak gradient 8 mmHg.  2. S/p TV annuloplasty 01/2017. Grossly normal appearance of TV annuloplasty repair. The tricuspid valve is has been repaired/replaced. The tricuspid valve is status post repair with an annuloplasty ring. Tricuspid valve regurgitation is mild to moderate.  3. Right ventricular systolic function is moderately reduced. The right ventricular size is moderately enlarged. There is normal pulmonary artery systolic pressure. The estimated right ventricular systolic pressure is 56.4 mmHg.  4. No residual atrial level shunt s/p ASD repair.  5. No residual VSD s/p repair of Tetralogy of Fallot in childhood.  6. Left ventricular ejection fraction, by estimation, is 60 to 65%. The left ventricle has normal function. The left ventricle has no regional wall motion abnormalities. There is mild left ventricular hypertrophy. Left ventricular diastolic parameters are consistent with Grade I diastolic dysfunction  (impaired relaxation).  7. Right atrial size was mildly dilated.  8. The mitral valve is normal in structure. Mild mitral valve regurgitation.  9. The aortic valve is tricuspid. Aortic valve regurgitation is trivial. 10. Aortic dilatation  noted. There is mild dilatation of the aortic root, measuring 40 mm. 11. The inferior vena cava is normal in size with greater than 50% respiratory variability, suggesting right atrial pressure of 3 mmHg. Conclusion(s)/Recommendation(s): No definite echo findings to support an etiology for syncope. FINDINGS  Left Ventricle: Left ventricular ejection fraction, by estimation, is 60 to 65%. The left ventricle has normal function. The left ventricle has no regional wall motion abnormalities. The left ventricular internal cavity size was normal in size. There is  mild left ventricular hypertrophy. Left ventricular diastolic parameters are consistent with Grade I diastolic dysfunction (impaired relaxation). Right Ventricle: The right ventricular size is moderately enlarged. No increase in right ventricular wall thickness. Right ventricular systolic function is moderately reduced. There is normal pulmonary artery systolic pressure. The tricuspid regurgitant velocity is 2.67 m/s, and with an assumed right atrial pressure of 3 mmHg, the estimated right ventricular systolic pressure is 12.4 mmHg. Left Atrium: Left atrial size was normal in size. Right Atrium: Right atrial size was mildly dilated. Prominent Eustachian valve. Pericardium: There is no evidence of pericardial effusion. Mitral Valve: The mitral valve is normal in structure. Mild mitral valve regurgitation. Tricuspid Valve: S/p TV annuloplasty 01/2017. Grossly normal appearance of TV annuloplasty repair. The tricuspid valve is has been repaired/replaced. Tricuspid valve regurgitation is mild to moderate. No evidence of tricuspid stenosis. The tricuspid valve  is status post repair with an annuloplasty ring. Aortic Valve: The aortic  valve is tricuspid. Aortic valve regurgitation is trivial. Aortic valve mean gradient measures 4.0 mmHg. Aortic valve peak gradient measures 7.6 mmHg. Aortic valve area, by VTI measures 3.34 cm. Pulmonic Valve: S/P pulmonary valve replacement with 27 mm Trifecta valve 01/2017. Grossly normal, valve is not well visualized. Mean gradient 4 mmHg, Vmax 1.5 m/s, peak gradient 8 mmHg. The pulmonic valve was not well visualized. Pulmonic valve regurgitation is trivial. No evidence of pulmonic stenosis. Aorta: Aortic dilatation noted. There is mild dilatation of the aortic root, measuring 40 mm. Venous: The inferior vena cava is normal in size with greater than 50% respiratory variability, suggesting right atrial pressure of 3 mmHg. IAS/Shunts: No atrial level shunt detected by color flow Doppler.  LEFT VENTRICLE PLAX 2D LVIDd:         3.90 cm     Diastology LVIDs:         2.10 cm     LV e' medial:    4.05 cm/s LV PW:         1.20 cm     LV E/e' medial:  13.6 LV IVS:        1.10 cm     LV e' lateral:   10.20 cm/s LVOT diam:     2.40 cm     LV E/e' lateral: 5.4 LV SV:         100 LV SV Index:   69 LVOT Area:     4.52 cm  LV Volumes (MOD) LV vol d, MOD A2C: 54.6 ml LV vol d, MOD A4C: 53.5 ml LV vol s, MOD A2C: 20.4 ml LV vol s, MOD A4C: 17.0 ml LV SV MOD A2C:     34.2 ml LV SV MOD A4C:     53.5 ml LV SV MOD BP:      36.0 ml RIGHT VENTRICLE            IVC RV S prime:     8.06 cm/s  IVC diam: 1.50 cm TAPSE (M-mode): 1.9 cm LEFT ATRIUM  Index       RIGHT ATRIUM           Index LA diam:      3.20 cm 2.21 cm/m  RA Area:     15.00 cm LA Vol (A2C): 12.4 ml 8.58 ml/m  RA Volume:   41.70 ml  28.86 ml/m LA Vol (A4C): 28.4 ml 19.66 ml/m  AORTIC VALVE AV Area (Vmax):    3.31 cm AV Area (Vmean):   3.49 cm AV Area (VTI):     3.34 cm AV Vmax:           138.00 cm/s AV Vmean:          87.300 cm/s AV VTI:            0.299 m AV Peak Grad:      7.6 mmHg AV Mean Grad:      4.0 mmHg LVOT Vmax:         101.00 cm/s LVOT Vmean:         67.300 cm/s LVOT VTI:          0.221 m LVOT/AV VTI ratio: 0.74  AORTA Ao Root diam: 3.80 cm Ao Asc diam:  4.00 cm MITRAL VALVE               TRICUSPID VALVE MV Area (PHT): 3.60 cm    TR Peak grad:   28.5 mmHg MV Decel Time: 211 msec    TR Vmax:        267.00 cm/s MV E velocity: 54.90 cm/s MV A velocity: 65.60 cm/s  SHUNTS MV E/A ratio:  0.84        Systemic VTI:  0.22 m                            Systemic Diam: 2.40 cm Cherlynn Kaiser MD Electronically signed by Cherlynn Kaiser MD Signature Date/Time: 09/30/2020/1:23:55 PM    Final     Assessment & Plan:   Michelle [name] was seen today for anemia.  Diagnoses and all orders for this visit:  Deficiency anemia- Her H&H are normal now.  I will screen her for vitamin deficiencies. -     CBC with Differential/Platelet; Future -     Vitamin B12; Future -     Iron; Future -     Reticulocytes; Future -     Folate; Future -     Ferritin; Future -     Vitamin B1; Future -     Vitamin B1 -     Ferritin -     Folate -     Reticulocytes -     Iron -     Vitamin B12 -     CBC with Differential/Platelet  Hypomagnesemia- Her magnesium level is normal now.  She can stop taking the magnesium supplement. -     Basic metabolic panel; Future -     Magnesium; Future -     Magnesium -     Basic metabolic panel  Hypokalemia- Her potassium level is a little too high at 5.3.  I have asked her to stop taking the potassium supplement. -     Basic metabolic panel; Future -     Magnesium; Future -     Cortisol; Future -     Cortisol -     Magnesium -     Basic metabolic panel  Visit for screening mammogram -     MM DIGITAL SCREENING BILATERAL;  Future  Essential hypertension- Her blood pressure is adequately well controlled.  Tobacco abuse -     Ambulatory Referral for Lung Cancer Scre  Cachexia ([place]) -     dronabinol (MARINOL) 2.5 MG capsule; Take 1 capsule (2.5 mg total) by mouth 2 (two) times daily before lunch and supper.  Moderate protein-calorie  malnutrition ([place]) -     dronabinol (MARINOL) 2.5 MG capsule; Take 1 capsule (2.5 mg total) by mouth 2 (two) times daily before lunch and supper.  Stage 3b chronic kidney disease ([place])- Her renal function has declined.  I have recommended she avoid nephrotoxic agents.  I have also recommended that she start seeing a nephrologist. -     Ambulatory referral to Nephrology   I have discontinued Michelle [name]'s loperamide and meclizine. I am also having her start on dronabinol. Additionally, I am having her maintain her cholecalciferol, aspirin, Fish Oil, cetirizine, triamcinolone ointment, sertraline, amitriptyline, dicyclomine, simvastatin, levothyroxine, albuterol, and donepezil.  Meds ordered this encounter  Medications  . dronabinol (MARINOL) 2.5 MG capsule    Sig: Take 1 capsule (2.5 mg total) by mouth 2 (two) times daily before lunch and supper.    Dispense:  180 capsule    Refill:  0   I spent 50 minutes in preparing to see the patient by review of recent labs, imaging and procedures, obtaining and reviewing separately obtained history, communicating with the patient and family or caregiver, ordering medications, tests or procedures, and documenting clinical information in the EHR including the differential Dx, treatment, and any further evaluation and other management of 1. Deficiency anemia 2. Hypomagnesemia 3. Hypokalemia 4. Essential hypertension 5. Tobacco abuse 6. Cachexia ([place]) 7. Moderate protein-calorie malnutrition ([place]) 8. Stage 3b chronic kidney disease ([place])     Follow-up: Return in about 3 months (around 02/01/2021).  [name], MD

## 2020-11-03 ENCOUNTER — Encounter: Payer: Self-pay | Admitting: Internal Medicine

## 2020-11-03 DIAGNOSIS — F411 Generalized anxiety disorder: Secondary | ICD-10-CM | POA: Diagnosis not present

## 2020-11-03 DIAGNOSIS — I452 Bifascicular block: Secondary | ICD-10-CM | POA: Diagnosis not present

## 2020-11-03 DIAGNOSIS — F32A Depression, unspecified: Secondary | ICD-10-CM | POA: Diagnosis not present

## 2020-11-03 DIAGNOSIS — M810 Age-related osteoporosis without current pathological fracture: Secondary | ICD-10-CM | POA: Diagnosis not present

## 2020-11-03 DIAGNOSIS — I951 Orthostatic hypotension: Secondary | ICD-10-CM | POA: Diagnosis not present

## 2020-11-03 DIAGNOSIS — J439 Emphysema, unspecified: Secondary | ICD-10-CM | POA: Diagnosis not present

## 2020-11-03 DIAGNOSIS — N1832 Chronic kidney disease, stage 3b: Secondary | ICD-10-CM | POA: Diagnosis not present

## 2020-11-03 DIAGNOSIS — U071 COVID-19: Secondary | ICD-10-CM | POA: Diagnosis not present

## 2020-11-03 DIAGNOSIS — I1310 Hypertensive heart and chronic kidney disease without heart failure, with stage 1 through stage 4 chronic kidney disease, or unspecified chronic kidney disease: Secondary | ICD-10-CM | POA: Diagnosis not present

## 2020-11-04 ENCOUNTER — Other Ambulatory Visit: Payer: Self-pay | Admitting: Internal Medicine

## 2020-11-04 DIAGNOSIS — E519 Thiamine deficiency, unspecified: Secondary | ICD-10-CM

## 2020-11-04 LAB — VITAMIN B1: Vitamin B1 (Thiamine): 7 nmol/L — ABNORMAL LOW (ref 8–30)

## 2020-11-04 LAB — RETICULOCYTES
ABS Retic: 69300 cells/uL (ref 20000–8000)
Retic Ct Pct: 1.5 %

## 2020-11-04 MED ORDER — VITAMIN B-1 50 MG PO TABS: 50.0000 mg | ORAL_TABLET | Freq: Every day | ORAL | 1 refills | Status: DC

## 2020-11-06 ENCOUNTER — Other Ambulatory Visit: Payer: Self-pay | Admitting: Internal Medicine

## 2020-11-06 DIAGNOSIS — I10 Essential (primary) hypertension: Secondary | ICD-10-CM

## 2020-11-07 ENCOUNTER — Other Ambulatory Visit: Payer: Self-pay | Admitting: Internal Medicine

## 2020-11-07 DIAGNOSIS — K582 Mixed irritable bowel syndrome: Secondary | ICD-10-CM

## 2020-11-08 ENCOUNTER — Telehealth: Payer: Medicare HMO

## 2020-11-09 ENCOUNTER — Telehealth: Payer: Medicare HMO

## 2020-11-09 NOTE — Chronic Care Management (AMB) (Deleted)
 Chronic Care Management Pharmacy  Name: Michelle [name]  MRN: [id number] DOB: 12/09/1951   Chief Complaint/ HPI  Michelle [name],  69 y.o. , female presents for their Follow-Up CCM visit with the clinical pharmacist via telephone due to COVID-19 Pandemic.  PCP : Jones, Thomas L, MD  Their chronic conditions include: Hypertension, Hyperlipidemia, Chronic Kidney Disease, Hypothyroidism, Anxiety, Osteoporosis and Tetralogy of Fallot s/p repair   Pt reports she is divorced, she used to live in [place], now in a secluded house 10 miles outside [place] with a roommate who is a family friend and autistic. She manages medications herself. She struggles with memory and relayed the same information multiple times during the visit.  Office Visits: 11/01/20 Dr Jones OV: stop Mg supplement (level is normal). Stop Potassium (K 5.3). Rx dronabinol for cachexia/malnutrition. Referred to nephrology  08/15/20 Dr Jones OV: 3 wk hx of abd pain/cramping, alternating constipation/diarhhea. Started dicylcomine 10 mg  07/12/20 Dr Jones OV: BP 172/96, taking OTC pain med, c/o melena and BRPBR x 1 month. Started Edarbyclor 40-12.5 mg.  04/22/20: Patient presented for Laura Murray, FNP for rib pain. Mammogram ordered, no medication changes made.  01/13/20: Patient presented to Dr. Jones for follow-up. Betamethsone (pt not using) and torsemide (pt ran out, bp controlled) discontinued.  Sertraline 100 mg restarted for anxiety.   Consult Visit: 09/29/20 hospitalization: lightheadedness, presyncopal episode. Likely d/t dehydration w/ poor oral intake after recent COVID infection. Treated with IV fluids. Home health concerned with lack of food. Declines SNF/ALF. APS following.  09/25/20 ED visit: acute head injury following fall.   09/22/20-09/25/20 hospitalization: COVID positive (GI sx), dehydration, hypotension, AKI. Given monoclonal antibody infusion for COVID. Stopped Edarbyclor at discharge.  01/19/20:  Patient presented to Edana Christy, NP for congenital heart disease follow-up. Patient without SOB or chest pain. No medication changes made. 01/12/20: Patient Referred to Dr. Perry (GI) for rectal bleeding. Metamucil started, patient instructed to increase fiber intake and water consumption. Colonoscopy ordered. Linaclotide stopped (error)  12/15/20: Patient underwent bladder cystoscopy  Allergies  Allergen Reactions  . Aricept [Donepezil] Other (See Comments)    bradycardia  . Penicillins Rash    Has patient had a PCN reaction causing immediate rash, facial/tongue/throat swelling, SOB or lightheadedness with hypotension: Yes Has patient had a PCN reaction causing severe rash involving mucus membranes or skin necrosis: No Has patient had a PCN reaction that required hospitalization: No Has patient had a PCN reaction occurring within the last 10 years: No If all of the above answers are "NO", then may proceed with Cephalosporin use.     Medications: Outpatient Encounter Medications as of 11/09/2020  Medication Sig  . albuterol (VENTOLIN HFA) 108 (90 Base) MCG/ACT inhaler Inhale 2 puffs into the lungs every 6 (six) hours as needed for wheezing or shortness of breath.  . amitriptyline (ELAVIL) 50 MG tablet TAKE 1 TABLET BY MOUTH EVERYDAY AT BEDTIME (Patient taking differently: Take 50 mg by mouth at bedtime. )  . aspirin 81 MG chewable tablet Chew 81 mg by mouth daily.   . cetirizine (ZYRTEC) 10 MG tablet Take 1 tablet (10 mg total) by mouth 2 (two) times daily.  . cholecalciferol (VITAMIN D) 1000 UNITS tablet Take 1,000 Units by mouth daily.   . dicyclomine (BENTYL) 10 MG capsule TAKE 1 CAPSULE BY MOUTH 3 TIMES DAILY BEFORE MEALS.  . donepezil (ARICEPT) 10 MG tablet TAKE 1 TABLET BY MOUTH EVERY DAY  . dronabinol (MARINOL) 2.5 MG capsule Take 1   capsule (2.5 mg total) by mouth 2 (two) times daily before lunch and supper.  . levothyroxine (SYNTHROID) 50 MCG tablet TAKE 1 TABLET BY MOUTH EVERY  DAY (Patient taking differently: Take 50 mcg by mouth daily. )  . Omega-3 Fatty Acids (FISH OIL) 1000 MG CAPS Take 2 capsules by mouth daily.   . sertraline (ZOLOFT) 100 MG tablet TAKE 1 TABLET BY MOUTH EVERY DAY (Patient taking differently: Take 100 mg by mouth daily. )  . simvastatin (ZOCOR) 10 MG tablet TAKE 1 TABLET BY MOUTH EVERY DAY (Patient taking differently: Take 10 mg by mouth daily. )  . thiamine (VITAMIN B-1) 50 MG tablet Take 1 tablet (50 mg total) by mouth daily.  . triamcinolone ointment (KENALOG) 0.1 % Apply topically 2 (two) times daily. (Patient taking differently: Apply 1 application topically 2 (two) times daily. )  . [DISCONTINUED] levothyroxine (SYNTHROID) 50 MCG tablet Take 1 tablet (50 mcg total) by mouth daily.  . [DISCONTINUED] levothyroxine (SYNTHROID) 50 MCG tablet TAKE 1 TABLET BY MOUTH EVERY DAY   No facility-administered encounter medications on file as of 11/09/2020.     Current Diagnosis/Assessment:    Goals Addressed   None    Hypertension   BP goal is:  <130/80  Office blood pressures are  BP Readings from Last 3 Encounters:  11/01/20 132/84  10/02/20 121/65  09/26/20 133/70   Lab Results  Component Value Date   CREATININE 1.15 11/01/2020   BUN 9 11/01/2020   GFR 48.20 (L) 11/01/2020   GFRNONAA 54 (L) 10/02/2020   GFRAA 64 07/12/2020   NA 148 (H) 11/01/2020   K 5.3 (H) 11/01/2020   CALCIUM 9.9 11/01/2020   CO2 28 11/01/2020   Patient checks BP at home infrequently Patient home BP readings are ranging: 120/70  Patient has failed these meds in the past: torsemide, Edarbyclor Patient is currently controlled on the following medications:  . No medications   We discussed diet and exercise extensively; pt brought medications to appt today and Edarbyclor was not included, she is no longer taking it as directed at recent hospital discharge due to low BP.  Pt reports she drinks at least 2 16-oz water bottles per day, as well as coffee,  milk, iced tea and juice throughout. Emphasized importance of hydration and adequate oral intake to maintain BP.  Plan  Continue control with diet and exercise   Hyperlipidemia   LDL goal < 100  Lipid Panel     Component Value Date/Time   CHOL 157 01/13/2020 1344   TRIG 139.0 01/13/2020 1344   HDL 44.20 01/13/2020 1344   LDLCALC 85 01/13/2020 1344   LDLDIRECT 113 (H) 06/16/2008 2036    Hepatic Function Latest Ref Rng & Units 09/25/2020 09/22/2020 09/22/2020  Total Protein 6.5 - 8.1 g/dL 4.6(L) 4.6(L) 4.6(L)  Albumin 3.5 - 5.0 g/dL 2.4(L) 2.6(L) 2.6(L)  AST 15 - 41 U/L 25 22 20  ALT 0 - 44 U/L 14 12 11  Alk Phosphatase 38 - 126 U/L 26(L) 31(L) 33(L)  Total Bilirubin 0.3 - 1.2 mg/dL 0.1(L) 0.6 0.6  Bilirubin, Direct 0.0 - 0.2 mg/dL - <0.1 -     The 10-year ASCVD risk score (Goff DC Jr., et al., 2013) is: 9.7%   Values used to calculate the score:     Age: 69 years     Sex: Female     Is Non-Hispanic African American: No     Diabetic: No     Tobacco smoker: No       Systolic Blood Pressure: 790 mmHg     Is BP treated: No     HDL Cholesterol: 44.2 mg/dL     Total Cholesterol: 157 mg/dL   Patient has failed these meds in past: n/a Patient is currently controlled on the following medications:  . Aspirin 81 mg daily  . Simvastatin 10 mg daily - afternoon  We discussed:  diet and exercise extensively; cholesterol goals and benefits of statin for ASCVD prevention  Plan  Continue current medications  Osteoporosis   Last DEXA Scan: 01/14/20   T-Score femoral neck: -2.8  T-Score total hip: -2.7  T-Score lumbar spine: -2.5  T-Score forearm radius: n/a  10-year probability of major osteoporotic fracture: n/a  10-year probability of hip fracture: n/a  No results found for: VD25OH   Patient is a candidate for pharmacologic treatment due to T-Score < -2.5 in femoral neck, T-Score < -2.5 in total hip  and T-Score < -2.5 in lumbar spine  Patient has failed these meds in  past: alendronate (09/2016 - 07/2017) Patient is currently controlled on the following medications:  Marland Kitchen Vitamin D 1000 units daily -not taking  We discussed:  Recommend [phone] units of vitamin D daily. Recommend 1200 mg of calcium daily from dietary and supplemental sources.; pt has been offered Prolia in the past but cost was too high for patient. Pt was advised to start calcium-Vit D at last visit but has not, likely due to memory issues.  Plan  Start calcium-Vitamin D supplement   Depression / Anxiety   Depression screen [facility] 2/9 11/01/2020 07/12/2020 12/02/2019  Decreased Interest 0 0 0  Down, Depressed, Hopeless 1 0 1  PHQ - 2 Score 1 0 1  Altered sleeping 0 1 0  Tired, decreased energy 0 1 1  Change in appetite 0 0 0  Feeling bad or failure about yourself  0 0 0  Trouble concentrating 0 0 1  Moving slowly or fidgety/restless 0 - 0  Suicidal thoughts 0 0 0  PHQ-9 Score _0 Difficult doing work/chores - Not difficult at all Not difficult at all  Some recent data might be hidden   GAD7 Score: GAD 7 : Generalized Anxiety Score 12/02/2019 10/29/2017  Nervous, Anxious, on Edge 1 3  Control/stop worrying 0 1  Worry too much - different things 0 2  Trouble relaxing 0 0  Restless 1 1  Easily annoyed or irritable 0 2  Afraid - awful might happen 0 0  Total GAD 7 Score 2 9  Anxiety Difficulty Not difficult at all -   Patient has failed these meds in past: n/a Patient is currently controlled on the following medications:  . Amitriptyline 50 mg QHS  . Sertraline 100 mg daily   We discussed:  Pt reports adherence with medications, denies issues currently. She separates amitriptyline from all her other meds since it is the only one she takes at bedtime.  Plan  Continue current medications  Cognitive impairment   Patient has failed these meds in past: n/a Patient is currently controlled on the following medications:  . Donepezil 10 mg daily  We discussed:  Pt brought  all meds to appt and this one was not included. Per fill history it was filled 07/03/20 for 90 ds, pt believes she does have it at home but unclear if she is taking it.  Plan  Continue current medications  IBS   Patient has failed these meds in past: n/a Patient is currently uncontrolled on  the following medications:  . Dicyclomine 10 mg TID w/ meals . Loperamide 2 mg PRN diarrhea  We discussed:  Pt is taking dicylomine once a day with her other once-daily medications. She reports she does not usually eat 3 meals a day. She denies issues with cramping currently, but has had more constipation. Suspect she has been putting loperamide in her pill box with other once-daily medications which may explain constipation.  Plan  Advised to separate loperamide from other meds, remove from pill box and only takes as needed    Vaccines   Reviewed and discussed patient's vaccination history.    Immunization History  Administered Date(s) Administered  . Fluad Quad(high Dose 65+) 08/11/2019  . Influenza Split 12/02/2012  . Influenza Whole 10/21/2007, 11/08/2010  . Influenza, High Dose Seasonal PF 10/11/2015, 08/16/2016, 08/14/2017, 01/28/2019  . Influenza,inj,Quad PF,6+ Mos 09/06/2014, 08/15/2020  . Pneumococcal Conjugate-13 07/30/2016  . Pneumococcal Polysaccharide-23 04/19/2015, 11/07/2016  . Td 04/16/2005  . Tdap 08/24/2015    Plan  Recommended patient receive COVID vaccine > 90 days after monoclonal antibody infusion (09/25/20)  Medication Management   Pt uses [facility] for all medications Uses pill box? Yes  Pt endorses compliance  We discussed: Verbal consent obtained for [facility] enhanced pharmacy services (medication synchronization, adherence packaging, delivery coordination). A medication sync plan was created to allow patient to get all medications delivered once every 30 to 90 days per patient preference. Patient understands they have freedom to choose pharmacy  and clinical pharmacist will coordinate care between all prescribers and [facility].    Plan  Utilize [facility] for medication synchronization, packaging and delivery    Follow up: 1 month phone visit   , PharmD, BCACP Clinical Pharmacist [facility] [phone] 

## 2020-11-15 ENCOUNTER — Telehealth: Payer: Self-pay | Admitting: Pharmacist

## 2020-11-15 NOTE — Progress Notes (Addendum)
  Chart review for chronic care management  Reviewed chart and insurance data for medication adherence. Patient does not have any gaps in adherence and is not in danger of failing any Medicare adherence measures. No further action required.   Wendy Poet, Clinical Pharmacist Assistant [facility]

## 2020-11-16 DIAGNOSIS — I951 Orthostatic hypotension: Secondary | ICD-10-CM | POA: Diagnosis not present

## 2020-11-16 DIAGNOSIS — I1310 Hypertensive heart and chronic kidney disease without heart failure, with stage 1 through stage 4 chronic kidney disease, or unspecified chronic kidney disease: Secondary | ICD-10-CM | POA: Diagnosis not present

## 2020-11-16 DIAGNOSIS — J439 Emphysema, unspecified: Secondary | ICD-10-CM | POA: Diagnosis not present

## 2020-11-16 DIAGNOSIS — F32A Depression, unspecified: Secondary | ICD-10-CM | POA: Diagnosis not present

## 2020-11-16 DIAGNOSIS — M810 Age-related osteoporosis without current pathological fracture: Secondary | ICD-10-CM | POA: Diagnosis not present

## 2020-11-16 DIAGNOSIS — N1832 Chronic kidney disease, stage 3b: Secondary | ICD-10-CM | POA: Diagnosis not present

## 2020-11-16 DIAGNOSIS — I452 Bifascicular block: Secondary | ICD-10-CM | POA: Diagnosis not present

## 2020-11-16 DIAGNOSIS — U071 COVID-19: Secondary | ICD-10-CM | POA: Diagnosis not present

## 2020-11-16 DIAGNOSIS — F411 Generalized anxiety disorder: Secondary | ICD-10-CM | POA: Diagnosis not present

## 2020-11-17 ENCOUNTER — Other Ambulatory Visit: Payer: Self-pay | Admitting: Internal Medicine

## 2020-11-17 DIAGNOSIS — F419 Anxiety disorder, unspecified: Secondary | ICD-10-CM

## 2020-11-17 DIAGNOSIS — I10 Essential (primary) hypertension: Secondary | ICD-10-CM

## 2020-11-17 MED ORDER — SERTRALINE HCL 100 MG PO TABS: 100.0000 mg | ORAL_TABLET | Freq: Every day | ORAL | 1 refills | Status: DC

## 2020-11-18 DIAGNOSIS — I1310 Hypertensive heart and chronic kidney disease without heart failure, with stage 1 through stage 4 chronic kidney disease, or unspecified chronic kidney disease: Secondary | ICD-10-CM | POA: Diagnosis not present

## 2020-11-18 DIAGNOSIS — I452 Bifascicular block: Secondary | ICD-10-CM | POA: Diagnosis not present

## 2020-11-18 DIAGNOSIS — U071 COVID-19: Secondary | ICD-10-CM | POA: Diagnosis not present

## 2020-11-18 DIAGNOSIS — F411 Generalized anxiety disorder: Secondary | ICD-10-CM | POA: Diagnosis not present

## 2020-11-18 DIAGNOSIS — I951 Orthostatic hypotension: Secondary | ICD-10-CM | POA: Diagnosis not present

## 2020-11-18 DIAGNOSIS — J439 Emphysema, unspecified: Secondary | ICD-10-CM | POA: Diagnosis not present

## 2020-11-18 DIAGNOSIS — M810 Age-related osteoporosis without current pathological fracture: Secondary | ICD-10-CM | POA: Diagnosis not present

## 2020-11-18 DIAGNOSIS — N1832 Chronic kidney disease, stage 3b: Secondary | ICD-10-CM | POA: Diagnosis not present

## 2020-11-18 DIAGNOSIS — F32A Depression, unspecified: Secondary | ICD-10-CM | POA: Diagnosis not present

## 2020-11-24 DIAGNOSIS — J439 Emphysema, unspecified: Secondary | ICD-10-CM | POA: Diagnosis not present

## 2020-11-24 DIAGNOSIS — F411 Generalized anxiety disorder: Secondary | ICD-10-CM | POA: Diagnosis not present

## 2020-11-24 DIAGNOSIS — I452 Bifascicular block: Secondary | ICD-10-CM | POA: Diagnosis not present

## 2020-11-24 DIAGNOSIS — I951 Orthostatic hypotension: Secondary | ICD-10-CM | POA: Diagnosis not present

## 2020-11-24 DIAGNOSIS — I1310 Hypertensive heart and chronic kidney disease without heart failure, with stage 1 through stage 4 chronic kidney disease, or unspecified chronic kidney disease: Secondary | ICD-10-CM | POA: Diagnosis not present

## 2020-11-24 DIAGNOSIS — F32A Depression, unspecified: Secondary | ICD-10-CM | POA: Diagnosis not present

## 2020-11-24 DIAGNOSIS — U071 COVID-19: Secondary | ICD-10-CM | POA: Diagnosis not present

## 2020-11-24 DIAGNOSIS — M810 Age-related osteoporosis without current pathological fracture: Secondary | ICD-10-CM | POA: Diagnosis not present

## 2020-11-24 DIAGNOSIS — N1832 Chronic kidney disease, stage 3b: Secondary | ICD-10-CM | POA: Diagnosis not present

## 2020-11-30 ENCOUNTER — Telehealth: Payer: Self-pay | Admitting: Internal Medicine

## 2020-11-30 NOTE — Telephone Encounter (Signed)
Debbie from [facility] calling, states she filed a report today with [facility] regarding issues/conditions concerning the patient  Please call [phone]

## 2020-12-02 ENCOUNTER — Ambulatory Visit (INDEPENDENT_AMBULATORY_CARE_PROVIDER_SITE_OTHER): Payer: Medicare HMO

## 2020-12-02 ENCOUNTER — Other Ambulatory Visit: Payer: Self-pay

## 2020-12-02 VITALS — BP 140/70 | HR 68 | Temp 97.5°F | Ht 64.0 in | Wt 87.6 lb

## 2020-12-02 DIAGNOSIS — Z0000 Encounter for general adult medical examination without abnormal findings: Secondary | ICD-10-CM | POA: Diagnosis not present

## 2020-12-02 NOTE — Patient Instructions (Signed)
Ms. Michelle [name] , Thank you for taking time to come for your Medicare Wellness Visit. I appreciate your ongoing commitment to your health goals. Please review the following plan we discussed and let me know if I can assist you in the future.   Screening recommendations/referrals: Colonoscopy: 11/04/2014 Mammogram: 06/23/2018 Bone Density: 01/14/2020 Recommended yearly ophthalmology/optometry visit for glaucoma screening and checkup Recommended yearly dental visit for hygiene and checkup  Vaccinations: Influenza vaccine: 08/15/2020 Pneumococcal vaccine: up to date Tdap vaccine: 08/24/2015 Shingles vaccine: never done   Covid-19: never done  Advanced directives: Advance directive discussed with you today. Even though you declined this today please call our office should you change your mind and we can give you the proper paperwork for you to fill out.  Conditions/risks identified: Yes; Reviewed health maintenance screenings with patient today and relevant education, vaccines, and/or referrals were provided. Please continue to do your personal lifestyle choices by: daily care of teeth and gums, regular physical activity (goal should be 5 days a week for 30 minutes), eat a healthy diet, avoid tobacco and drug use, limiting any alcohol intake, taking a low-dose aspirin (if not allergic or have been advised by your provider otherwise) and taking vitamins and minerals as recommended by your provider. Continue doing brain stimulating activities (puzzles, reading, adult coloring books, staying active) to keep memory sharp. Continue to eat heart healthy diet (full of fruits, vegetables, whole grains, lean protein, water--limit salt, fat, and sugar intake) and increase physical activity as tolerated.  Next appointment: Please schedule your next Medicare Wellness Visit with your Nurse Health Advisor in 1 year by calling [phone].   Preventive Care 70 Years and Older, Female Preventive care refers to  lifestyle choices and visits with your health care provider that can promote health and wellness. What does preventive care include?  A yearly physical exam. This is also called an annual well check.  Dental exams once or twice a year.  Routine eye exams. Ask your health care provider how often you should have your eyes checked.  Personal lifestyle choices, including:  Daily care of your teeth and gums.  Regular physical activity.  Eating a healthy diet.  Avoiding tobacco and drug use.  Limiting alcohol use.  Practicing safe sex.  Taking low-dose aspirin every day.  Taking vitamin and mineral supplements as recommended by your health care provider. What happens during an annual well check? The services and screenings done by your health care provider during your annual well check will depend on your age, overall health, lifestyle risk factors, and family history of disease. Counseling  Your health care provider may ask you questions about your:  Alcohol use.  Tobacco use.  Drug use.  Emotional well-being.  Home and relationship well-being.  Sexual activity.  Eating habits.  History of falls.  Memory and ability to understand (cognition).  Work and work Statistician.  Reproductive health. Screening  You may have the following tests or measurements:  Height, weight, and BMI.  Blood pressure.  Lipid and cholesterol levels. These may be checked every 5 years, or more frequently if you are over 57 years old.  Skin check.  Lung cancer screening. You may have this screening every year starting at age 23 if you have a 30-pack-year history of smoking and currently smoke or have quit within the past 15 years.  Fecal occult blood test (FOBT) of the stool. You may have this test every year starting at age 6.  Flexible sigmoidoscopy or colonoscopy. You may  have a sigmoidoscopy every 5 years or a colonoscopy every 10 years starting at age 56.  Hepatitis C blood  test.  Hepatitis B blood test.  Sexually transmitted disease (STD) testing.  Diabetes screening. This is done by checking your blood sugar (glucose) after you have not eaten for a while (fasting). You may have this done every 1-3 years.  Bone density scan. This is done to screen for osteoporosis. You may have this done starting at age 85.  Mammogram. This may be done every 1-2 years. Talk to your health care provider about how often you should have regular mammograms. Talk with your health care provider about your test results, treatment options, and if necessary, the need for more tests. Vaccines  Your health care provider may recommend certain vaccines, such as:  Influenza vaccine. This is recommended every year.  Tetanus, diphtheria, and acellular pertussis (Tdap, Td) vaccine. You may need a Td booster every 10 years.  Zoster vaccine. You may need this after age 32.  Pneumococcal 13-valent conjugate (PCV13) vaccine. One dose is recommended after age 53.  Pneumococcal polysaccharide (PPSV23) vaccine. One dose is recommended after age 78. Talk to your health care provider about which screenings and vaccines you need and how often you need them. This information is not intended to replace advice given to you by your health care provider. Make sure you discuss any questions you have with your health care provider. Document Released: 12/30/2015 Document Revised: 08/22/2016 Document Reviewed: 10/04/2015 Elsevier Interactive Patient Education  2017 [place] Prevention in the Home Falls can cause injuries. They can happen to people of all ages. There are many things you can do to make your home safe and to help prevent falls. What can I do on the outside of my home?  Regularly fix the edges of walkways and driveways and fix any cracks.  Remove anything that might make you trip as you walk through a door, such as a raised step or threshold.  Trim any bushes or trees on the  path to your home.  Use bright outdoor lighting.  Clear any walking paths of anything that might make someone trip, such as rocks or tools.  Regularly check to see if handrails are loose or broken. Make sure that both sides of any steps have handrails.  Any raised decks and porches should have guardrails on the edges.  Have any leaves, snow, or ice cleared regularly.  Use sand or salt on walking paths during winter.  Clean up any spills in your garage right away. This includes oil or grease spills. What can I do in the bathroom?  Use night lights.  Install grab bars by the toilet and in the tub and shower. Do not use towel bars as grab bars.  Use non-skid mats or decals in the tub or shower.  If you need to sit down in the shower, use a plastic, non-slip stool.  Keep the floor dry. Clean up any water that spills on the floor as soon as it happens.  Remove soap buildup in the tub or shower regularly.  Attach bath mats securely with double-sided non-slip rug tape.  Do not have throw rugs and other things on the floor that can make you trip. What can I do in the bedroom?  Use night lights.  Make sure that you have a light by your bed that is easy to reach.  Do not use any sheets or blankets that are too big for your  bed. They should not hang down onto the floor.  Have a firm chair that has side arms. You can use this for support while you get dressed.  Do not have throw rugs and other things on the floor that can make you trip. What can I do in the kitchen?  Clean up any spills right away.  Avoid walking on wet floors.  Keep items that you use a lot in easy-to-reach places.  If you need to reach something above you, use a strong step stool that has a grab bar.  Keep electrical cords out of the way.  Do not use floor polish or wax that makes floors slippery. If you must use wax, use non-skid floor wax.  Do not have throw rugs and other things on the floor that can  make you trip. What can I do with my stairs?  Do not leave any items on the stairs.  Make sure that there are handrails on both sides of the stairs and use them. Fix handrails that are broken or loose. Make sure that handrails are as long as the stairways.  Check any carpeting to make sure that it is firmly attached to the stairs. Fix any carpet that is loose or worn.  Avoid having throw rugs at the top or bottom of the stairs. If you do have throw rugs, attach them to the floor with carpet tape.  Make sure that you have a light switch at the top of the stairs and the bottom of the stairs. If you do not have them, ask someone to add them for you. What else can I do to help prevent falls?  Wear shoes that:  Do not have high heels.  Have rubber bottoms.  Are comfortable and fit you well.  Are closed at the toe. Do not wear sandals.  If you use a stepladder:  Make sure that it is fully opened. Do not climb a closed stepladder.  Make sure that both sides of the stepladder are locked into place.  Ask someone to hold it for you, if possible.  Clearly mark and make sure that you can see:  Any grab bars or handrails.  First and last steps.  Where the edge of each step is.  Use tools that help you move around (mobility aids) if they are needed. These include:  Canes.  Walkers.  Scooters.  Crutches.  Turn on the lights when you go into a dark area. Replace any light bulbs as soon as they burn out.  Set up your furniture so you have a clear path. Avoid moving your furniture around.  If any of your floors are uneven, fix them.  If there are any pets around you, be aware of where they are.  Review your medicines with your doctor. Some medicines can make you feel dizzy. This can increase your chance of falling. Ask your doctor what other things that you can do to help prevent falls. This information is not intended to replace advice given to you by your health care  provider. Make sure you discuss any questions you have with your health care provider. Document Released: 09/29/2009 Document Revised: 05/10/2016 Document Reviewed: 01/07/2015 Elsevier Interactive Patient Education  2017 [employer].
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
## 2020-12-07 ENCOUNTER — Ambulatory Visit: Payer: Medicare HMO | Admitting: Internal Medicine

## 2020-12-07 ENCOUNTER — Telehealth: Payer: Self-pay

## 2020-12-07 NOTE — Telephone Encounter (Signed)
Called pt to triage her symptoms for her scheduled 1040 appt today.  Pt states she does not recall having an appt & does not want to come in today.  Pt states she has had a cough for a few days & believes it to be related to it being cold in her house or that she is a smoker.  Pt advised to get covid tested at her local pharmacy to be sure.

## 2020-12-12 ENCOUNTER — Telehealth: Payer: Self-pay | Admitting: Internal Medicine

## 2020-12-12 ENCOUNTER — Ambulatory Visit: Payer: Medicare HMO | Admitting: Internal Medicine

## 2020-12-12 NOTE — Telephone Encounter (Signed)
Patient had stopped by the office to schedule an appt.  She had previously been scheduled for sore throat and nasal drip on 12/07/20 but the appointment was cancelled.   Patient states that the symptoms has gotten better but she would like to still have it checked out.   Is it okay for the patient to be seen in office or should this be a virtual visit?   Please advise.

## 2020-12-13 NOTE — Telephone Encounter (Signed)
I have spoke to the patient and she stated that she is feeling better but her throat is sore. She stated that it is due to her "bad habit". She has been smoking more lately and been smoking more than her normal pack a day.   I suggested that she cut back on smoking some to see if that helps her throat soreness.

## 2020-12-14 ENCOUNTER — Ambulatory Visit: Payer: Medicare HMO

## 2020-12-20 ENCOUNTER — Other Ambulatory Visit: Payer: Self-pay

## 2020-12-20 ENCOUNTER — Ambulatory Visit (INDEPENDENT_AMBULATORY_CARE_PROVIDER_SITE_OTHER): Payer: Medicare HMO | Admitting: Internal Medicine

## 2020-12-20 ENCOUNTER — Inpatient Hospital Stay: Admission: RE | Admit: 2020-12-20 | Payer: Medicare HMO | Source: Ambulatory Visit

## 2020-12-20 ENCOUNTER — Encounter: Payer: Self-pay | Admitting: Internal Medicine

## 2020-12-20 VITALS — BP 124/80 | HR 71 | Temp 98.1°F | Ht 64.0 in | Wt 94.0 lb

## 2020-12-20 DIAGNOSIS — S0990XA Unspecified injury of head, initial encounter: Secondary | ICD-10-CM | POA: Insufficient documentation

## 2020-12-20 DIAGNOSIS — G44311 Acute post-traumatic headache, intractable: Secondary | ICD-10-CM | POA: Diagnosis not present

## 2020-12-20 NOTE — Progress Notes (Signed)
Subjective:  Patient ID: Michelle [name], female    DOB: 1951/09/01  Age: 70 y.o. MRN: [id number]  CC: Head Injury  This visit occurred during the SARS-CoV-2 public health emergency.  Safety protocols were in place, including screening questions prior to the visit, additional usage of staff PPE, and extensive cleaning of exam room while observing appropriate contact time as indicated for disinfecting solutions.    HPI [name] presents for f/up - She complains that she fell about a week or 2 ago and hit the back of her head.  She had an area of swelling on her posterior scalp that has gotten better.  She said she briefly lost consciousness and has a persistent posterior headache.  She has an unchanged degree of weakness in her upper and lower extremities as well as ataxia.  She denies changes in her vision or hearing.  She denies neck or back pain.  She denies slurred speech, ataxia, or paresthesias.  Outpatient Medications Prior to Visit  Medication Sig Dispense Refill  . albuterol (VENTOLIN HFA) 108 (90 Base) MCG/ACT inhaler Inhale 2 puffs into the lungs every 6 (six) hours as needed for wheezing or shortness of breath. 6.7 g 0  . amitriptyline (ELAVIL) 50 MG tablet TAKE 1 TABLET BY MOUTH EVERYDAY AT BEDTIME (Patient taking differently: Take 50 mg by mouth at bedtime.) 90 tablet 1  . aspirin 81 MG chewable tablet Chew 81 mg by mouth daily.     . cetirizine (ZYRTEC) 10 MG tablet Take 1 tablet (10 mg total) by mouth 2 (two) times daily. 180 tablet 1  . cholecalciferol (VITAMIN D) 1000 UNITS tablet Take 1,000 Units by mouth daily.     Marland Kitchen dicyclomine (BENTYL) 10 MG capsule TAKE 1 CAPSULE BY MOUTH 3 TIMES DAILY BEFORE MEALS. 270 capsule 1  . donepezil (ARICEPT) 10 MG tablet TAKE 1 TABLET BY MOUTH EVERY DAY 90 tablet 1  . dronabinol (MARINOL) 2.5 MG capsule Take 1 capsule (2.5 mg total) by mouth 2 (two) times daily before lunch and supper. 180 capsule 0  . levothyroxine (SYNTHROID) 50 MCG  tablet TAKE 1 TABLET BY MOUTH EVERY DAY (Patient taking differently: Take 50 mcg by mouth daily.) 90 tablet 1  . Omega-3 Fatty Acids (FISH OIL) 1000 MG CAPS Take 2 capsules by mouth daily.     . sertraline (ZOLOFT) 100 MG tablet Take 1 tablet (100 mg total) by mouth daily. 90 tablet 1  . simvastatin (ZOCOR) 10 MG tablet TAKE 1 TABLET BY MOUTH EVERY DAY (Patient taking differently: Take 10 mg by mouth daily.) 90 tablet 1  . thiamine (VITAMIN B-1) 50 MG tablet Take 1 tablet (50 mg total) by mouth daily. 90 tablet 1  . triamcinolone ointment (KENALOG) 0.1 % Apply topically 2 (two) times daily. (Patient taking differently: Apply 1 application topically 2 (two) times daily.) 453.6 g 2   No facility-administered medications prior to visit.    ROS Review of Systems  Constitutional: Negative.  Negative for appetite change, diaphoresis, fatigue and unexpected weight change.  HENT: Negative.   Eyes: Negative.  Negative for visual disturbance.  Respiratory: Negative for cough, chest tightness, shortness of breath and wheezing.   Cardiovascular: Negative for chest pain, palpitations and leg swelling.  Gastrointestinal: Negative for abdominal pain, diarrhea, nausea and vomiting.  Endocrine: Negative.   Genitourinary: Negative.  Negative for difficulty urinating.  Musculoskeletal: Positive for gait problem. Negative for arthralgias, back pain and myalgias.  Skin: Negative.  Negative for color change.  Neurological: Positive for weakness and headaches. Negative for dizziness, speech difficulty, light-headedness and numbness.  Hematological: Negative for adenopathy. Does not bruise/bleed easily.  Psychiatric/Behavioral: Negative.     Objective:  BP 124/80   Pulse 71   Temp 98.1 F (36.7 C) (Oral)   Ht 5\' 4"  (1.626 m)   Wt 94 lb (42.6 kg)   SpO2 96%   BMI 16.14 kg/m   BP Readings from Last 3 Encounters:  12/20/20 124/80  12/02/20 140/70  11/01/20 132/84    Wt Readings from Last 3  Encounters:  12/20/20 94 lb (42.6 kg)  12/02/20 87 lb 9.6 oz (39.7 kg)  11/01/20 90 lb (40.8 kg)    Physical Exam Vitals reviewed.  HENT:     Head: Normocephalic. Contusion present. No raccoon eyes, Battle's sign, abrasion, masses or laceration. Hair is normal.      Right Ear: Hearing normal. No hemotympanum.     Left Ear: Hearing normal. No hemotympanum.     Nose: Nose normal.     Mouth/Throat:     Mouth: Mucous membranes are moist.  Eyes:     General: No scleral icterus.    Conjunctiva/sclera: Conjunctivae normal.  Cardiovascular:     Rate and Rhythm: Normal rate and regular rhythm.     Heart sounds: No murmur heard.   Pulmonary:     Effort: Pulmonary effort is normal.     Breath sounds: No stridor. No wheezing, rhonchi or rales.  Abdominal:     General: Abdomen is flat.     Palpations: There is no mass.     Tenderness: There is no abdominal tenderness. There is no guarding.  Musculoskeletal:        General: Normal range of motion.     Cervical back: Neck supple.     Right lower leg: No edema.     Left lower leg: No edema.  Skin:    General: Skin is warm and dry.     Coloration: Skin is not pale.  Neurological:     General: No focal deficit present.     Mental Status: She is alert and oriented to person, place, and time. Mental status is at baseline.     Cranial Nerves: Cranial nerves are intact. No cranial nerve deficit.     Sensory: Sensation is intact. No sensory deficit.     Motor: Weakness present.     Coordination: Coordination abnormal.     Gait: Gait normal.     Deep Tendon Reflexes: Reflexes normal. Babinski sign absent on the right side. Babinski sign absent on the left side.     Reflex Scores:      Tricep reflexes are 2+ on the right side and 2+ on the left side.      Bicep reflexes are 2+ on the right side and 2+ on the left side.      Brachioradialis reflexes are 2+ on the right side and 2+ on the left side.      Patellar reflexes are 2+ on the right  side and 2+ on the left side.      Achilles reflexes are 1+ on the right side and 1+ on the left side.    Comments: Diffuse BU and LE weakness 4/5  Psychiatric:        Mood and Affect: Mood normal.        Behavior: Behavior normal.     Lab Results  Component Value Date   WBC 5.9 11/01/2020   HGB 13.6  11/01/2020   HCT 41.6 11/01/2020   PLT 272.0 11/01/2020   GLUCOSE 88 11/01/2020   CHOL 157 01/13/2020   TRIG 139.0 01/13/2020   HDL 44.20 01/13/2020   LDLDIRECT 113 (H) 06/16/2008   LDLCALC 85 01/13/2020   ALT 14 09/25/2020   AST 25 09/25/2020   NA 148 (H) 11/01/2020   K 5.3 (H) 11/01/2020   CL 108 11/01/2020   CREATININE 1.15 11/01/2020   BUN 9 11/01/2020   CO2 28 11/01/2020   TSH 1.420 09/22/2020   INR 1.3 (H) 09/22/2020    CT HEAD WO CONTRAST  Result Date: 09/30/2020 CLINICAL DATA:  Dizziness, headache EXAM: CT HEAD WITHOUT CONTRAST TECHNIQUE: Contiguous axial images were obtained from the base of the skull through the vertex without intravenous contrast. COMPARISON:  None. FINDINGS: Brain: Normal anatomic configuration. Parenchymal volume loss is commensurate with the patient's age. Mild periventricular white matter changes are present likely reflecting the sequela of small vessel ischemia. Probable dilated perivascular space seen within the inferior left basal ganglia. No abnormal intra or extra-axial mass lesion or fluid collection. No abnormal mass effect or midline shift. No evidence of acute intracranial hemorrhage or infarct. Ventricular size is normal. Cerebellum unremarkable. Vascular: No asymmetric hyperdense vasculature at the skull base. Skull: Intact Sinuses/Orbits: There is mild mucosal thickening within the ethmoid air cells as well as a small air-fluid level within the right frontal sinus in keeping with changes of mild paranasal sinus disease. Remaining paranasal sinuses are clear. Orbits are unremarkable. Other: Mastoid air cells and middle ear cavities are clear.  Small left parasagittal posterior scalp hematoma noted. IMPRESSION: Small scalp hematoma. No acute intracranial abnormality. No calvarial fracture. Mild paranasal sinus disease. Electronically Signed   By: Fidela Salisbury MD   On: 09/30/2020 01:20   DG Chest Port 1 View  Result Date: 09/30/2020 CLINICAL DATA:  Chest pain EXAM: PORTABLE CHEST 1 VIEW COMPARISON:  09/22/2020 FINDINGS: The heart size and mediastinal contours are within normal limits. Both lungs are clear. The visualized skeletal structures are unremarkable. IMPRESSION: No active disease. Electronically Signed   By: Ulyses Jarred M.D.   On: 09/30/2020 00:14   ECHOCARDIOGRAM COMPLETE  Result Date: 09/30/2020    ECHOCARDIOGRAM REPORT   Patient Name:   NAYONNA SIDERIS Date of Exam: 09/30/2020 Medical Rec #:  [id number]        Height:       64.0 in Accession #:    [id number]       Weight:       98.0 lb Date of Birth:  07-Dec-1951         BSA:          1.445 m Patient Age:    70 years         BP:           98/59 mmHg Patient Gender: F                HR:           59 bpm. Exam Location:  Inpatient Procedure: 2D Echo, 3D Echo, Cardiac Doppler and Color Doppler Indications:    R55 Syncope  History:        Patient has prior history of Echocardiogram examinations, most                 recent 07/23/2016. Arrythmias:RBBB, Signs/Symptoms:Murmur and                 Syncope; Risk Factors:Dyslipidemia. Tetralogy of  Fallot repair                 at age 18. Covid 19 positive.  Sonographer:    Roseanna Rainbow RDCS Referring Phys: [phone] JARED M GARDNER  Sonographer Comments: Technically difficult study due to poor echo windows. Patient supine. IMPRESSIONS  1. S/P pulmonary valve replacement with 27 mm Trifecta valve 01/2017. Grossly normal, valve is not well visualized. Mean gradient 4 mmHg, Vmax 1.5 m/s, peak gradient 8 mmHg.  2. S/p TV annuloplasty 01/2017. Grossly normal appearance of TV annuloplasty repair. The tricuspid valve is has been repaired/replaced. The tricuspid  valve is status post repair with an annuloplasty ring. Tricuspid valve regurgitation is mild to moderate.  3. Right ventricular systolic function is moderately reduced. The right ventricular size is moderately enlarged. There is normal pulmonary artery systolic pressure. The estimated right ventricular systolic pressure is 0000000 mmHg.  4. No residual atrial level shunt s/p ASD repair.  5. No residual VSD s/p repair of Tetralogy of Fallot in childhood.  6. Left ventricular ejection fraction, by estimation, is 60 to 65%. The left ventricle has normal function. The left ventricle has no regional wall motion abnormalities. There is mild left ventricular hypertrophy. Left ventricular diastolic parameters are consistent with Grade I diastolic dysfunction (impaired relaxation).  7. Right atrial size was mildly dilated.  8. The mitral valve is normal in structure. Mild mitral valve regurgitation.  9. The aortic valve is tricuspid. Aortic valve regurgitation is trivial. 10. Aortic dilatation noted. There is mild dilatation of the aortic root, measuring 40 mm. 11. The inferior vena cava is normal in size with greater than 50% respiratory variability, suggesting right atrial pressure of 3 mmHg. Conclusion(s)/Recommendation(s): No definite echo findings to support an etiology for syncope. FINDINGS  Left Ventricle: Left ventricular ejection fraction, by estimation, is 60 to 65%. The left ventricle has normal function. The left ventricle has no regional wall motion abnormalities. The left ventricular internal cavity size was normal in size. There is  mild left ventricular hypertrophy. Left ventricular diastolic parameters are consistent with Grade I diastolic dysfunction (impaired relaxation). Right Ventricle: The right ventricular size is moderately enlarged. No increase in right ventricular wall thickness. Right ventricular systolic function is moderately reduced. There is normal pulmonary artery systolic pressure. The tricuspid  regurgitant velocity is 2.67 m/s, and with an assumed right atrial pressure of 3 mmHg, the estimated right ventricular systolic pressure is 0000000 mmHg. Left Atrium: Left atrial size was normal in size. Right Atrium: Right atrial size was mildly dilated. Prominent Eustachian valve. Pericardium: There is no evidence of pericardial effusion. Mitral Valve: The mitral valve is normal in structure. Mild mitral valve regurgitation. Tricuspid Valve: S/p TV annuloplasty 01/2017. Grossly normal appearance of TV annuloplasty repair. The tricuspid valve is has been repaired/replaced. Tricuspid valve regurgitation is mild to moderate. No evidence of tricuspid stenosis. The tricuspid valve  is status post repair with an annuloplasty ring. Aortic Valve: The aortic valve is tricuspid. Aortic valve regurgitation is trivial. Aortic valve mean gradient measures 4.0 mmHg. Aortic valve peak gradient measures 7.6 mmHg. Aortic valve area, by VTI measures 3.34 cm. Pulmonic Valve: S/P pulmonary valve replacement with 27 mm Trifecta valve 01/2017. Grossly normal, valve is not well visualized. Mean gradient 4 mmHg, Vmax 1.5 m/s, peak gradient 8 mmHg. The pulmonic valve was not well visualized. Pulmonic valve regurgitation is trivial. No evidence of pulmonic stenosis. Aorta: Aortic dilatation noted. There is mild dilatation of the aortic root, measuring 40 mm.  Venous: The inferior vena cava is normal in size with greater than 50% respiratory variability, suggesting right atrial pressure of 3 mmHg. IAS/Shunts: No atrial level shunt detected by color flow Doppler.  LEFT VENTRICLE PLAX 2D LVIDd:         3.90 cm     Diastology LVIDs:         2.10 cm     LV e' medial:    4.05 cm/s LV PW:         1.20 cm     LV E/e' medial:  13.6 LV IVS:        1.10 cm     LV e' lateral:   10.20 cm/s LVOT diam:     2.40 cm     LV E/e' lateral: 5.4 LV SV:         100 LV SV Index:   69 LVOT Area:     4.52 cm  LV Volumes (MOD) LV vol d, MOD A2C: 54.6 ml LV vol d, MOD  A4C: 53.5 ml LV vol s, MOD A2C: 20.4 ml LV vol s, MOD A4C: 17.0 ml LV SV MOD A2C:     34.2 ml LV SV MOD A4C:     53.5 ml LV SV MOD BP:      36.0 ml RIGHT VENTRICLE            IVC RV S prime:     8.06 cm/s  IVC diam: 1.50 cm TAPSE (M-mode): 1.9 cm LEFT ATRIUM           Index       RIGHT ATRIUM           Index LA diam:      3.20 cm 2.21 cm/m  RA Area:     15.00 cm LA Vol (A2C): 12.4 ml 8.58 ml/m  RA Volume:   41.70 ml  28.86 ml/m LA Vol (A4C): 28.4 ml 19.66 ml/m  AORTIC VALVE AV Area (Vmax):    3.31 cm AV Area (Vmean):   3.49 cm AV Area (VTI):     3.34 cm AV Vmax:           138.00 cm/s AV Vmean:          87.300 cm/s AV VTI:            0.299 m AV Peak Grad:      7.6 mmHg AV Mean Grad:      4.0 mmHg LVOT Vmax:         101.00 cm/s LVOT Vmean:        67.300 cm/s LVOT VTI:          0.221 m LVOT/AV VTI ratio: 0.74  AORTA Ao Root diam: 3.80 cm Ao Asc diam:  4.00 cm MITRAL VALVE               TRICUSPID VALVE MV Area (PHT): 3.60 cm    TR Peak grad:   28.5 mmHg MV Decel Time: 211 msec    TR Vmax:        267.00 cm/s MV E velocity: 54.90 cm/s MV A velocity: 65.60 cm/s  SHUNTS MV E/A ratio:  0.84        Systemic VTI:  0.22 m                            Systemic Diam: 2.40 cm Cherlynn Kaiser MD Electronically signed by Cherlynn Kaiser MD Signature Date/Time: 09/30/2020/1:23:55 PM  Final     Assessment & Plan:   Ayvah was seen today for head injury.  Diagnoses and all orders for this visit:  Traumatic injury of head, initial encounter- I recommended that she undergo a noncontrasted CT to see if there is a skull fracture, subdural hematoma, or intracranial hemorrhage.  She is getting adequate symptom relief with Tylenol. -     CT Head Wo Contrast; Future  Intractable acute post-traumatic headache- See above. -     CT Head Wo Contrast; Future   I am having Deepika I. Kleiman maintain her cholecalciferol, aspirin, Fish Oil, cetirizine, triamcinolone ointment, amitriptyline, simvastatin, levothyroxine,  albuterol, donepezil, dronabinol, thiamine, dicyclomine, and sertraline.  No orders of the defined types were placed in this encounter.    Follow-up: Return if symptoms worsen or fail to improve.  [name], MD

## 2020-12-20 NOTE — Patient Instructions (Signed)
[employer] (pp. 2876-2897). [place], [place]. Elsevier."> Neurosurgery, 80(1), 6-15. Retrieved on June 07, 2019.[email or web address]"> Primary Care (5th ed., pp. 218-221). [place], [place]: [employer]."> Brain Injury, 29(6), 688-700. [email or web address]"> Rosen's Emergency Medicine: Concepts and Clinical Practice (9th ed., pp. 301-329). [place], [place]: [employer].">  Head Injury, Adult There are many types of head injuries. Head injuries can be as minor as a bump, or they can be a serious medical issue. More severe head injuries include:  A jarring injury to the brain (concussion).  A bruise (contusion) of the brain. This means there is bleeding in the brain that can cause swelling.  A cracked skull (skull fracture).  Bleeding in the brain that collects, clots, and forms a bump (hematoma). After a head injury, most problems occur within the first 24 hours, but side effects may occur up to 7-10 days after the injury. It is important to watch your condition for any changes. You may need to be observed in the emergency department or urgent care, or you may be admitted to the hospital. What are the causes? There are many possible causes of a head injury. A serious head injury may be caused by a car accident, bicycle or motorcycle accidents, sports injuries, and falls. What are the symptoms? Symptoms of a head injury include a contusion, bump, or bleeding at the site of the injury. Other physical symptoms may include:  Headache.  Nausea or vomiting.  Dizziness.  Feeling tired.  Being uncomfortable around bright lights or loud noises.  Seizures.  Trouble being awakened.  Fainting. Mental or emotional symptoms may include:  Irritability.  Confusion and memory problems.  Poor attention and concentration.  Changes in eating or sleeping habits.  Anxiety or depression. How is this diagnosed? This condition can  usually be diagnosed based on your symptoms, a description of the injury, and a physical exam. You may also have imaging tests done, such as a CT scan or MRI. How is this treated? Treatment for this condition depends on the severity and type of injury you have. The main goal of treatment is to prevent complications and to allow the brain time to heal. Mild head injury If you have a mild head injury, you may be sent home and treatment may include:  Observation. A responsible adult should stay with you for 24 hours after your injury and check on you often.  Physical rest.  Brain rest.  Pain medicines. Severe head injury If you have a severe head injury, treatment may include:  Close observation. This includes hospitalization with frequent physical exams.  Medicines to relieve pain, prevent seizures, and decrease brain swelling.  Breathing support. This may include using a ventilator.  Treatments to manage the swelling inside the brain.  Brain surgery. This may be needed to: ? Remove a blood clot. ? Stop the bleeding. ? Remove a part of the skull to allow room for the brain to swell. Follow these instructions at home: Activity  Rest and avoid activities that are physically hard or tiring.  Make sure you get enough sleep.  Limit activities that require a lot of thought or attention, such as: ? Watching TV. ? Playing memory games and puzzles. ? Job-related work or homework. ? Working on the computer, using social media, and texting.  Avoid activities that could cause another head injury, such as playing sports, until your health care provider approves. Having another head injury, especially before the first one has healed, can be dangerous.  Ask your health care   provider when it is safe for you to return to your regular activities, including work or school. Ask your health care provider for a step-by-step plan for gradually returning to activities.  Ask your health care  provider when you can drive, ride a bicycle, or use heavy machinery. Your ability to react may be slower after a brain injury. Do not do these activities if you are dizzy. Lifestyle   Do not drink alcohol until your health care provider approves. Do not use drugs. Alcohol and certain drugs may slow your recovery and can put you at risk of further injury.  If it is harder than usual to remember things, write them down.  If you are easily distracted, try to do one thing at a time.  Talk with family members or close friends when making important decisions.  Tell your friends, family, a trusted colleague, and work manager about your injury, symptoms, and restrictions. Have them watch for any new or worsening problems. General instructions  Take over-the-counter and prescription medicines only as told by your health care provider.  Have someone stay with you for 24 hours after your head injury. This person should watch you for any changes in your symptoms and be ready to seek medical help.  Keep all follow-up visits as told by your health care provider. This is important. How is this prevented?  Work on improving your balance and strength to avoid falls.  Wear a seatbelt when you are in a moving vehicle.  Wear a helmet when riding a bicycle, skiing, or doing any other sport or activity that has a risk of injury.  If you drink alcohol: ? Limit how much you use to:  0-1 drink a day for women.  0-2 drinks a day for men. ? Be aware of how much alcohol is in your drink. In the [place] one drink equals one 12 oz bottle of beer (355 mL), one 5 oz glass of wine (148 mL), or one 1 oz glass of hard liquor (44 mL).  Take safety measures in your home, such as: ? Removing clutter and tripping hazards from floors and stairways. ? Using grab bars in bathrooms and handrails by stairs. ? Placing non-slip mats on floors and in bathtubs. ? Improving lighting in dim areas. Get help right away  if:  You have: ? A severe headache that is not helped by medicine. ? Trouble walking or weakness in your arms and legs. ? Clear or bloody fluid coming from your nose or ears. ? Changes in your vision. ? A seizure.  You lose your balance.  You vomit.  Your pupils change size.  Your speech is slurred.  Your dizziness gets worse.  You faint.  You are sleepier than normal and have trouble staying awake.  Your symptoms get worse. These symptoms may represent a serious problem that is an emergency. Do not wait to see if the symptoms will go away. Get medical help right away. Call your local emergency services (911 in the [place] Do not drive yourself to the hospital. Summary  Head injuries can be minor or they can be a serious medical issue requiring immediate attention.  Treatment for this condition depends on the severity and type of injury you have.  Ask your health care provider when it is safe for you to return to your regular activities, including work or school.  Head injury prevention includes wearing a seat belt in a motor vehicle, using a helmet on a bicycle, limiting alcohol   use, and taking safety measures in your home. This information is not intended to replace advice given to you by your health care provider. Make sure you discuss any questions you have with your health care provider. Document Revised: 12/31/2018 Document Reviewed: 12/26/2018 Elsevier Patient Education  2020 [employer]  

## 2020-12-22 ENCOUNTER — Other Ambulatory Visit: Payer: Self-pay

## 2020-12-22 ENCOUNTER — Ambulatory Visit (INDEPENDENT_AMBULATORY_CARE_PROVIDER_SITE_OTHER)
Admission: RE | Admit: 2020-12-22 | Discharge: 2020-12-22 | Disposition: A | Discharge status: Home / Self Care | Payer: Medicare HMO | Source: Ambulatory Visit | Attending: Internal Medicine | Admitting: Internal Medicine

## 2020-12-22 ENCOUNTER — Other Ambulatory Visit: Payer: Medicare HMO

## 2020-12-22 DIAGNOSIS — G44311 Acute post-traumatic headache, intractable: Secondary | ICD-10-CM

## 2020-12-22 DIAGNOSIS — S0990XA Unspecified injury of head, initial encounter: Secondary | ICD-10-CM | POA: Diagnosis not present

## 2020-12-22 DIAGNOSIS — R55 Syncope and collapse: Secondary | ICD-10-CM | POA: Diagnosis not present

## 2020-12-22 DIAGNOSIS — G9389 Other specified disorders of brain: Secondary | ICD-10-CM | POA: Diagnosis not present

## 2020-12-22 DIAGNOSIS — G319 Degenerative disease of nervous system, unspecified: Secondary | ICD-10-CM | POA: Diagnosis not present

## 2020-12-27 ENCOUNTER — Telehealth: Payer: Self-pay | Admitting: Pharmacist

## 2020-12-27 NOTE — Progress Notes (Signed)
Chronic Care Management Pharmacy Assistant   Name: Michelle [name]  MRN: [id number] DOB: 1951/07/25  Reason for Encounter: General Adherence Call   PCP : [name], MD  Allergies:   Allergies  Allergen Reactions  . Aricept [Donepezil] Other (See Comments)    bradycardia  . Penicillins Rash    Has patient had a PCN reaction causing immediate rash, facial/tongue/throat swelling, SOB or lightheadedness with hypotension: Yes Has patient had a PCN reaction causing severe rash involving mucus membranes or skin necrosis: No Has patient had a PCN reaction that required hospitalization: No Has patient had a PCN reaction occurring within the last 10 years: No If all of the above answers are "NO", then may proceed with Cephalosporin use.     Medications: Outpatient Encounter Medications as of 12/27/2020  Medication Sig  . albuterol (VENTOLIN HFA) 108 (90 Base) MCG/ACT inhaler Inhale 2 puffs into the lungs every 6 (six) hours as needed for wheezing or shortness of breath.  Marland Kitchen amitriptyline (ELAVIL) 50 MG tablet TAKE 1 TABLET BY MOUTH EVERYDAY AT BEDTIME (Patient taking differently: Take 50 mg by mouth at bedtime.)  . aspirin 81 MG chewable tablet Chew 81 mg by mouth daily.   . cetirizine (ZYRTEC) 10 MG tablet Take 1 tablet (10 mg total) by mouth 2 (two) times daily.  . cholecalciferol (VITAMIN D) 1000 UNITS tablet Take 1,000 Units by mouth daily.   Marland Kitchen dicyclomine (BENTYL) 10 MG capsule TAKE 1 CAPSULE BY MOUTH 3 TIMES DAILY BEFORE MEALS.  Marland Kitchen donepezil (ARICEPT) 10 MG tablet TAKE 1 TABLET BY MOUTH EVERY DAY  . dronabinol (MARINOL) 2.5 MG capsule Take 1 capsule (2.5 mg total) by mouth 2 (two) times daily before lunch and supper.  . levothyroxine (SYNTHROID) 50 MCG tablet TAKE 1 TABLET BY MOUTH EVERY DAY (Patient taking differently: Take 50 mcg by mouth daily.)  . Omega-3 Fatty Acids (FISH OIL) 1000 MG CAPS Take 2 capsules by mouth daily.   . sertraline (ZOLOFT) 100 MG tablet Take 1 tablet  (100 mg total) by mouth daily.  . simvastatin (ZOCOR) 10 MG tablet TAKE 1 TABLET BY MOUTH EVERY DAY (Patient taking differently: Take 10 mg by mouth daily.)  . thiamine (VITAMIN B-1) 50 MG tablet Take 1 tablet (50 mg total) by mouth daily.  Marland Kitchen triamcinolone ointment (KENALOG) 0.1 % Apply topically 2 (two) times daily. (Patient taking differently: Apply 1 application topically 2 (two) times daily.)   No facility-administered encounter medications on file as of 12/27/2020.    Current Diagnosis: Patient Active Problem List   Diagnosis Date Noted  . Head trauma 12/20/2020  . Intractable acute post-traumatic headache 12/20/2020  . Manifestations of thiamine deficiency 11/04/2020  . Deficiency anemia 11/01/2020  . Hypomagnesemia 11/01/2020  . Hypokalemia 11/01/2020  . Tobacco abuse 11/01/2020  . Cachexia ([place]) 11/01/2020  . Vertigo 10/02/2020  . Irritable bowel syndrome with both constipation and diarrhea 08/15/2020  . BRBPR (bright red blood per rectum) 07/12/2020  . Stage 3b chronic kidney disease ([place]) 01/14/2020  . Tetralogy of Fallot s/p repair 12/04/2019  . RBBB 12/04/2019  . Current mild episode of major depressive disorder without prior episode ([place]) 10/28/2017  . S/P surgical pulmonary valve replacement 03/05/2017  . S/P TVR (tricuspid valve repair) 03/05/2017  . Status post atrial septal defect repair 01/25/2017  . Moderate tricuspid regurgitation 01/01/2017  . Acquired hypothyroidism 11/08/2016  . TOF (tetralogy of Fallot) 09/27/2016  . Visit for screening mammogram 07/30/2016  . Osteoporosis 07/30/2016  .  Mononeuropathy of right lower extremity 11/30/2014  . Health care maintenance 11/10/2014  . Congenital heart disease in adult 09/28/2014  . Essential hypertension 09/17/2014  . Anxiety state 09/23/2012  . 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 Hyperlipidemia LDL goal <100 03/19/2007    Goals Addressed   None     Follow-Up:  Pharmacist Review   Preformed a chart  review the patient has medications with [facility] and [facility].   The patient and I have a very long conversation about her medications and it appears that she may not be taking her Donepezil, Dicyclomine, Dronabinol or any of her vitamins correctly. As of today she has only been taking her Levothyroxine, Simvastatin, aspirin and amitriptyline. I discussed in detail that she will be able to get all of her meds [facility] and we will be able to package and delivery her medications to her home. We reviewed any cost that she thought she would have to pay. I explained that there is no extra out of pocket cost for packaging and delivering her meds. The patient did seem very confused as to what is going on with her medications as far as what she should be taking and when so I scheduled her an follow-up appointment for 01/03/21 @ 130pm with the clinical pharmacist Mendel Ryder. I informed the patient that I will also follow up with her to help get her back on track with her medications.   Rosendo Gros, [facility]  Practice Team Manager/ CPA (Clinical Pharmacist Assistant) [phone]

## 2021-01-02 ENCOUNTER — Ambulatory Visit: Payer: Medicare HMO | Admitting: Dermatology

## 2021-01-03 ENCOUNTER — Other Ambulatory Visit: Payer: Self-pay

## 2021-01-03 ENCOUNTER — Ambulatory Visit: Payer: Medicare HMO | Admitting: Pharmacist

## 2021-01-03 DIAGNOSIS — I10 Essential (primary) hypertension: Secondary | ICD-10-CM

## 2021-01-03 DIAGNOSIS — M810 Age-related osteoporosis without current pathological fracture: Secondary | ICD-10-CM

## 2021-01-03 DIAGNOSIS — E785 Hyperlipidemia, unspecified: Secondary | ICD-10-CM

## 2021-01-03 NOTE — Patient Instructions (Signed)
Visit Information  Phone number for Pharmacist: [phone]  Goals Addressed            This Visit's Progress   . Pharmacy Care Plan       CARE PLAN ENTRY (see longitudinal plan of care for additional care plan information)  Current Barriers:  . Chronic Disease Management support, education, and care coordination needs related to Hypertension, Hyperlipidemia, and Osteoporosis   Hypertension BP Readings from Last 3 Encounters:  12/20/20 124/80  12/02/20 140/70  11/01/20 132/84 .  Pharmacist Clinical Goal(s): o Over the next 30 days, patient will work with PharmD and providers to maintain BP goal <130/80 . Current regimen:  o No medications . Interventions: o Discussed BP goals and benefits of checking BP at home occasionally  . Patient self care activities - Over the next 30 days, patient will: o Check BP occasionally, document, and provide at future appointments o Ensure daily salt intake < 2300 mg/day  Hyperlipidemia / Cardiovascular risk reduction Lab Results  Component Value Date/Time   LDLCALC 85 01/13/2020 01:44 PM   LDLDIRECT 113 (H) 06/16/2008 08:36 PM .  Pharmacist Clinical Goal(s): o Over the next 30 days, patient will work with PharmD and providers to maintain LDL goal < 100 . Current regimen:  o Simvastatin 10 mg daily  o Aspirin 81 mg daily  . Interventions: o Discussed cholesterol goals and benefits of medication for prevention of heart attack / stroke . Patient self care activities - Over the next 30 days, patient will: o Continue medication as prescribed o Continue low cholesterol diet  Osteoporosis . Pharmacist Clinical Goal(s) o Over the next 30 days, patient will work with PharmD and providers to optimize therapy . Current regimen:  o No medications . Interventions: o Recommend calcium 1200 mg/day and Vitamin D 1000 units/day from diet and/or supplement . Patient self care activities - Over the next 30 days, patient will: o Start  calcium-Vitamin D supplement for bone health  Medication management . Pharmacist Clinical Goal(s): o Over the next 30 days, patient will work with PharmD and providers to achieve optimal medication adherence . [facility] . Interventions o Comprehensive medication review performed. o Utilize [facility] for medication synchronization, packaging and delivery . Patient self care activities - Over the next 30 days, patient will: o Focus on medication adherence by pill box o Take medications as prescribed o Report any questions or concerns to PharmD and/or provider(s)  Please see past updates related to this goal by clicking on the "Past Updates" button in the selected goal       There are no care plans to display for this patient.  The patient verbalized understanding of instructions, educational materials, and care plan provided today and agreed to receive a mailed copy of patient instructions, educational materials, and care plan.  Telephone follow up appointment with pharmacy team member scheduled for: 1 month  Charlene Brooke, PharmD, [facility] Clinical Pharmacist [place] at [facility] [phone]

## 2021-01-03 NOTE — Chronic Care Management (AMB) (Signed)
Chronic Care Management Pharmacy  Name: Michelle [name]  MRN: [id number] DOB: 1951-10-01   Chief Complaint/ HPI  Michelle [name],  70 y.o. , female presents for their Follow-Up CCM visit with the clinical pharmacist via telephone due to COVID-19 Pandemic.  PCP : [name], MD  Their chronic conditions include: Hypertension, Hyperlipidemia, Chronic Kidney Disease, Hypothyroidism, Anxiety, Osteoporosis and Tetralogy of Fallot s/p repair   Pt reports she is divorced, she used to live in [place], now in a secluded house [place] miles outside [place] with a roommate who is a family friend and autistic. She manages medications herself. She struggles with memory and relayed the same information multiple times during the visit.  Office Visits: 12/20/20 Dr Ronnald Ramp OV: acute visit for head injury after fall. Ordered CT scan which was normal.  11/01/20 Dr Ronnald Ramp OV: hospital f/u. K and Mg normal, can stop mag and K supplement. Referred for lung cancer screening CT. Started dronabinol for cachexia (not filled). Referred to nephrology.  08/15/20 Dr Ronnald Ramp OV: 3 wk hx of abd pain/cramping, alternating constipation/diarhhea. Started dicylcomine 10 mg  07/12/20 Dr Ronnald Ramp OV: BP 172/96, taking OTC pain med, c/o melena and BRPBR x 1 month. Started Edarbyclor 40-12.5 mg.  04/22/20: Patient presented for Jodi Mourning, FNP for rib pain. Mammogram ordered, no medication changes made.  01/13/20: Patient presented to Dr. Ronnald Ramp for follow-up. Betamethsone (pt not using) and torsemide (pt ran out, bp controlled) discontinued.  Sertraline 100 mg restarted for anxiety.   Consult Visit: 09/29/20 hospitalization: lightheadedness, presyncopal episode. Likely d/t dehydration w/ poor oral intake after recent COVID infection. Treated with IV fluids. Home health concerned with lack of food. Declines SNF/ALF. APS following.  09/25/20 ED visit: acute head injury following fall.   09/22/20-09/25/20 hospitalization: COVID  positive (GI sx), dehydration, hypotension, AKI. Given monoclonal antibody infusion for COVID. Stopped Edarbyclor at discharge.  01/19/20: Patient presented to Kathlee Nations, NP for congenital heart disease follow-up. Patient without SOB or chest pain. No medication changes made. 01/12/20: Patient Referred to Dr. Henrene Pastor (GI) for rectal bleeding. Metamucil started, patient instructed to increase fiber intake and water consumption. Colonoscopy ordered. Linaclotide stopped (error)  12/15/20: Patient underwent bladder cystoscopy  Allergies  Allergen Reactions  . Aricept [Donepezil] Other (See Comments)    bradycardia  . Penicillins Rash    Has patient had a PCN reaction causing immediate rash, facial/tongue/throat swelling, SOB or lightheadedness with hypotension: Yes Has patient had a PCN reaction causing severe rash involving mucus membranes or skin necrosis: No Has patient had a PCN reaction that required hospitalization: No Has patient had a PCN reaction occurring within the last 10 years: No If all of the above answers are "NO", then may proceed with Cephalosporin use.     Medications: Outpatient Encounter Medications as of 01/03/2021  Medication Sig  . albuterol (VENTOLIN HFA) 108 (90 Base) MCG/ACT inhaler Inhale 2 puffs into the lungs every 6 (six) hours as needed for wheezing or shortness of breath.  Marland Kitchen amitriptyline (ELAVIL) 50 MG tablet TAKE 1 TABLET BY MOUTH EVERYDAY AT BEDTIME (Patient taking differently: Take 50 mg by mouth at bedtime as needed.)  . aspirin 81 MG chewable tablet Chew 81 mg by mouth daily.   Marland Kitchen levothyroxine (SYNTHROID) 50 MCG tablet TAKE 1 TABLET BY MOUTH EVERY DAY (Patient taking differently: Take 50 mcg by mouth daily.)  . sertraline (ZOLOFT) 100 MG tablet Take 1 tablet (100 mg total) by mouth daily.  . simvastatin (ZOCOR) 10 MG  tablet TAKE 1 TABLET BY MOUTH EVERY DAY (Patient taking differently: Take 10 mg by mouth daily.)  . triamcinolone ointment (KENALOG) 0.1 %  Apply topically 2 (two) times daily.  . [DISCONTINUED] cetirizine (ZYRTEC) 10 MG tablet Take 1 tablet (10 mg total) by mouth 2 (two) times daily. (Patient not taking: Reported on 01/03/2021)  . [DISCONTINUED] cholecalciferol (VITAMIN D) 1000 UNITS tablet Take 1,000 Units by mouth daily.  (Patient not taking: Reported on 01/03/2021)  . [DISCONTINUED] dicyclomine (BENTYL) 10 MG capsule TAKE 1 CAPSULE BY MOUTH 3 TIMES DAILY BEFORE MEALS. (Patient not taking: Reported on 01/03/2021)  . [DISCONTINUED] donepezil (ARICEPT) 10 MG tablet TAKE 1 TABLET BY MOUTH EVERY DAY (Patient not taking: Reported on 01/03/2021)  . [DISCONTINUED] dronabinol (MARINOL) 2.5 MG capsule Take 1 capsule (2.5 mg total) by mouth 2 (two) times daily before lunch and supper. (Patient not taking: Reported on 01/03/2021)  . [DISCONTINUED] Omega-3 Fatty Acids (FISH OIL) 1000 MG CAPS Take 2 capsules by mouth daily.  (Patient not taking: Reported on 01/03/2021)  . [DISCONTINUED] thiamine (VITAMIN B-1) 50 MG tablet Take 1 tablet (50 mg total) by mouth daily. (Patient not taking: Reported on 01/03/2021)   No facility-administered encounter medications on file as of 01/03/2021.   Lab Results  Component Value Date   CREATININE 1.15 11/01/2020   BUN 9 11/01/2020   GFR 48.20 (L) 11/01/2020   GFRNONAA 54 (L) 10/02/2020   GFRAA 64 07/12/2020   NA 148 (H) 11/01/2020   K 5.3 (H) 11/01/2020   CALCIUM 9.9 11/01/2020   CO2 28 11/01/2020   Wt Readings from Last 3 Encounters:  12/20/20 94 lb (42.6 kg)  12/02/20 87 lb 9.6 oz (39.7 kg)  11/01/20 90 lb (40.8 kg)   Current Diagnosis/Assessment:    Goals Addressed            This Visit's Progress   . Pharmacy Care Plan       CARE PLAN ENTRY (see longitudinal plan of care for additional care plan information)  Current Barriers:  . Chronic Disease Management support, education, and care coordination needs related to Hypertension, Hyperlipidemia, and Osteoporosis   Hypertension BP Readings  from Last 3 Encounters:  12/20/20 124/80  12/02/20 140/70  11/01/20 132/84 .  Pharmacist Clinical Goal(s): o Over the next 30 days, patient will work with PharmD and providers to maintain BP goal <130/80 . Current regimen:  o No medications . Interventions: o Discussed BP goals and benefits of checking BP at home occasionally  . Patient self care activities - Over the next 30 days, patient will: o Check BP occasionally, document, and provide at future appointments o Ensure daily salt intake < 2300 mg/day  Hyperlipidemia / Cardiovascular risk reduction Lab Results  Component Value Date/Time   LDLCALC 85 01/13/2020 01:44 PM   LDLDIRECT 113 (H) 06/16/2008 08:36 PM .  Pharmacist Clinical Goal(s): o Over the next 30 days, patient will work with PharmD and providers to maintain LDL goal < 100 . Current regimen:  o Simvastatin 10 mg daily  o Aspirin 81 mg daily  . Interventions: o Discussed cholesterol goals and benefits of medication for prevention of heart attack / stroke . Patient self care activities - Over the next 30 days, patient will: o Continue medication as prescribed o Continue low cholesterol diet  Osteoporosis . Pharmacist Clinical Goal(s) o Over the next 30 days, patient will work with PharmD and providers to optimize therapy . Current regimen:  o No medications . Interventions: o  Recommend calcium 1200 mg/day and Vitamin D 1000 units/day from diet and/or supplement . Patient self care activities - Over the next 30 days, patient will: o Start calcium-Vitamin D supplement for bone health  Medication management . Pharmacist Clinical Goal(s): o Over the next 30 days, patient will work with PharmD and providers to achieve optimal medication adherence . [facility] . Interventions o Comprehensive medication review performed. o Utilize [facility] for medication synchronization, packaging and delivery . Patient self care activities - Over the next 30  days, patient will: o Focus on medication adherence by pill box o Take medications as prescribed o Report any questions or concerns to PharmD and/or provider(s)  Please see past updates related to this goal by clicking on the "Past Updates" button in the selected goal        Hyperlipidemia   LDL goal < 100  Lipid Panel     Component Value Date/Time   CHOL 157 01/13/2020 1344   TRIG 139.0 01/13/2020 1344   HDL 44.20 01/13/2020 1344   LDLCALC 85 01/13/2020 1344   LDLDIRECT 113 (H) 06/16/2008 2036    Hepatic Function Latest Ref Rng & Units 09/25/2020 09/22/2020 09/22/2020  Total Protein 6.5 - 8.1 g/dL 4.6(L) 4.6(L) 4.6(L)  Albumin 3.5 - 5.0 g/dL 2.4(L) 2.6(L) 2.6(L)  AST 15 - 41 U/L _0 ALT 0 - 44 U/L _1 Alk Phosphatase 38 - 126 U/L 26(L) 31(L) 33(L)  Total Bilirubin 0.3 - 1.2 mg/dL 0.1(L) 0.6 0.6  Bilirubin, Direct 0.0 - 0.2 mg/dL - <0.1 -    The 10-year ASCVD risk score Mikey Bussing DC Jr., et al., 2013) is: 8.7%   Values used to calculate the score:     Age: 24 years     Sex: Female     Is Non-Hispanic African American: No     Diabetic: No     Tobacco smoker: No     Systolic Blood Pressure: 295 mmHg     Is BP treated: No     HDL Cholesterol: 44.2 mg/dL     Total Cholesterol: 157 mg/dL   Patient has failed these meds in past: n/a Patient is currently controlled on the following medications:  . Aspirin 81 mg daily  . Simvastatin 10 mg daily - afternoon  We discussed:  diet and exercise extensively; cholesterol goals and benefits of statin for ASCVD prevention  Plan  Continue current medications  Osteoporosis   Last DEXA Scan: 01/14/20   T-Score femoral neck: -2.8  T-Score total hip: -2.7  T-Score lumbar spine: -2.5  T-Score forearm radius: n/a  10-year probability of major osteoporotic fracture: n/a  10-year probability of hip fracture: n/a  No results found for: VD25OH   Patient is a candidate for pharmacologic treatment due to T-Score < -2.5 in  femoral neck, T-Score < -2.5 in total hip  and T-Score < -2.5 in lumbar spine  Patient has failed these meds in past: alendronate (09/2016 - 07/2017) Patient is currently controlled on the following medications:  Marland Kitchen Vitamin D 1000 units daily -not taking  We discussed:  Recommend [phone] units of vitamin D daily. Recommend 1200 mg of calcium daily from dietary and supplemental sources.; pt has been offered Prolia in the past but cost was too high for patient. Pt was advised to start calcium-Vit D at last visit but has not, likely due to memory issues.  Plan  Start calcium-Vitamin D supplement   Depression / Anxiety   Depression  screen [facility] 2/9 12/02/2020 11/01/2020 07/12/2020  Decreased Interest 0 0 0  Down, Depressed, Hopeless 0 1 0  PHQ - 2 Score 0 1 0  Altered sleeping - 0 1  Tired, decreased energy - 0 1  Change in appetite - 0 0  Feeling bad or failure about yourself  - 0 0  Trouble concentrating - 0 0  Moving slowly or fidgety/restless - 0 -  Suicidal thoughts - 0 0  PHQ-9 Score - 1 2  Difficult doing work/chores - - Not difficult at all  Some recent data might be hidden   GAD7 Score: GAD 7 : Generalized Anxiety Score 12/02/2019 10/29/2017  Nervous, Anxious, on Edge 1 3  Control/stop worrying 0 1  Worry too much - different things 0 2  Trouble relaxing 0 0  Restless 1 1  Easily annoyed or irritable 0 2  Afraid - awful might happen 0 0  Total GAD 7 Score 2 9  Anxiety Difficulty Not difficult at all -   Patient has failed these meds in past: n/a Patient is currently controlled on the following medications:  . Amitriptyline 50 mg QHS PRN - once or twice a week . Sertraline 100 mg daily   We discussed:  Pt reports adherence with medications, denies issues currently. She separates amitriptyline from all her other meds since it is the only one she takes at bedtime - she only takes it as needed if she thinks she won't be able to sleep well.  Plan  Continue current  medications  Cognitive impairment   Patient has failed these meds in past: donepezil (never started) Patient is currently controlled on the following medications:  . No medication  We discussed:  Pt is not taking donepezil, she never started it, it was removed from list.  Plan  Continue current medications  IBS   Patient has failed these meds in past: dicyclomine, loperamide Patient is currently uncontrolled on the following medications:  . No medication  We discussed:  Pt stopped dicyclimine, thought it was causing diarrhea. She only endorses some constipation, discussed benefit of fiber intake, hydration and stool softener.  Plan  Increase fiber and water intake Recommend docusate 100 mg PRN  Hypothyroidism   Lab Results  Component Value Date/Time   TSH 1.420 09/22/2020 02:30 AM   TSH 2.01 07/12/2020 02:38 PM   TSH 0.36 01/13/2020 01:44 PM   Patient has failed these meds in past: n/a Patient is currently controlled on the following medications:  . Levothyroxine 50 mcg daily  We discussed:  Pt reports adherence with medication, last TSH was wnl.  Plan  Continue current medications  Vaccines   Reviewed and discussed patient's vaccination history.    Immunization History  Administered Date(s) Administered  . Fluad Quad(high Dose 65+) 08/11/2019  . Influenza Split 12/02/2012  . Influenza Whole 10/21/2007, 11/08/2010  . Influenza, High Dose Seasonal PF 10/11/2015, 08/16/2016, 08/14/2017, 01/28/2019  . Influenza,inj,Quad PF,6+ Mos 09/06/2014, 08/15/2020  . Pneumococcal Conjugate-13 07/30/2016  . Pneumococcal Polysaccharide-23 04/19/2015, 11/07/2016  . Td 04/16/2005  . Tdap 08/24/2015    Plan  Recommended patient receive COVID vaccine > 90 days after monoclonal antibody infusion (09/25/20)  Medication Management   [facility] Uses pill box? Yes  Pt endorses compliance  We discussed: Verbal consent obtained for [facility] enhanced pharmacy services (medication synchronization, adherence packaging, delivery coordination). A medication sync plan was created to allow patient to get all medications delivered once every  30 to 90 days per patient preference. Patient understands they have freedom to choose pharmacy and clinical pharmacist will coordinate care between all prescribers and [facility].  Updated medication list with what patient is taking at home. Pt reports she has ~45 days remaining on all her medications.    Plan  Utilize [facility] pharmacy for medication synchronization, packaging and delivery    Follow up: 1 month phone visit  Charlene Brooke, PharmD, [facility] Clinical Pharmacist [place] [facility] [phone]

## 2021-01-24 ENCOUNTER — Ambulatory Visit: Payer: Medicare HMO | Admitting: Internal Medicine

## 2021-02-01 ENCOUNTER — Telehealth: Payer: Self-pay

## 2021-02-01 DIAGNOSIS — F419 Anxiety disorder, unspecified: Secondary | ICD-10-CM

## 2021-02-01 DIAGNOSIS — E785 Hyperlipidemia, unspecified: Secondary | ICD-10-CM

## 2021-02-01 DIAGNOSIS — E039 Hypothyroidism, unspecified: Secondary | ICD-10-CM

## 2021-02-01 MED ORDER — SIMVASTATIN 10 MG PO TABS: 10.0000 mg | ORAL_TABLET | Freq: Every day | ORAL | 1 refills | Status: DC

## 2021-02-01 MED ORDER — SERTRALINE HCL 100 MG PO TABS: 100.0000 mg | ORAL_TABLET | Freq: Every day | ORAL | 1 refills | Status: DC

## 2021-02-01 MED ORDER — LEVOTHYROXINE SODIUM 50 MCG PO TABS: 50.0000 ug | ORAL_TABLET | Freq: Every day | ORAL | 1 refills | Status: DC

## 2021-02-01 NOTE — Telephone Encounter (Signed)
-----   Message from Charlton Haws, [facility] sent at 01/31/2021  2:58 PM EST ----- Regarding: Med refills Can you refill the following meds to [facility] please?  Levothyroxine Sertraline Simvastatin

## 2021-02-01 NOTE — Telephone Encounter (Signed)
Done

## 2021-02-02 ENCOUNTER — Ambulatory Visit (INDEPENDENT_AMBULATORY_CARE_PROVIDER_SITE_OTHER): Payer: Medicare HMO | Admitting: Pharmacist

## 2021-02-02 ENCOUNTER — Other Ambulatory Visit: Payer: Self-pay

## 2021-02-02 DIAGNOSIS — E785 Hyperlipidemia, unspecified: Secondary | ICD-10-CM | POA: Diagnosis not present

## 2021-02-02 DIAGNOSIS — M810 Age-related osteoporosis without current pathological fracture: Secondary | ICD-10-CM | POA: Diagnosis not present

## 2021-02-02 DIAGNOSIS — F419 Anxiety disorder, unspecified: Secondary | ICD-10-CM

## 2021-02-02 NOTE — Patient Instructions (Signed)
Visit Information  Phone number for Pharmacist: [phone]  Goals Addressed   None    Patient Care Plan: [place] Plan    Problem Identified: Hyperlipidemia, Depression, Anxiety and Osteoporosis   Priority: High    Long-Range Goal: Disease management   Start Date: 02/02/2021  Expected End Date: 08/02/2021  This Visit's Progress: On track  Priority: High  Note:    Current Barriers:  . Unable to independently monitor therapeutic efficacy . Unable to self administer medications as prescribed  Pharmacist Clinical Goal(s):  Marland Kitchen Over the next 90 days, patient will achieve adherence to monitoring guidelines and medication adherence to achieve therapeutic efficacy . achieve ability to self administer medications as prescribed through use of pill packs as evidenced by patient report through collaboration with PharmD and provider.   Interventions: . 1:1 collaboration with [name], MD regarding development and update of comprehensive plan of care as evidenced by provider attestation and co-signature . Inter-disciplinary care team collaboration (see longitudinal plan of care) . Comprehensive medication review performed; medication list updated in electronic medical record  Hyperlipidemia / Cardiovascular risk reduction (LDL goal < 100) Current regimen:  ? Simvastatin 10 mg daily  ? Aspirin 81 mg daily  Interventions: ? Discussed cholesterol goals and benefits of medication for prevention of heart attack / stroke Patient self care activities  ? Continue medication as prescribed ? Continue low cholesterol diet   Depression / Anxiety / Insomnia Current regimen:  o Sertraline 100 mg daily o Amitriptyline 50 mg HS as needed Interventions: o Discussed benefits of medications. Pt denies side effects o May consider mirtazapine in the future for appetite stimulation Patient self care activities o Continue current medications  Osteoporosis Current regimen:  ? No  medications Interventions: ? Recommend calcium 1200 mg/day and Vitamin D 1000 units/day from diet and/or supplement Patient self care activities ? Start calcium-Vitamin D supplement for bone health  Patient Goals/Self-Care Activities . Over the next 90 days, patient will:  - take medications as prescribed focus on medication adherence by pill packs  Follow Up Plan: Telephone follow up appointment with care management team member scheduled for: 3 months      The patient verbalized understanding of instructions, educational materials, and care plan provided today and declined offer to receive copy of patient instructions, educational materials, and care plan.  Telephone follow up appointment with pharmacy team member scheduled for: 3 months  Charlene Brooke, PharmD, [facility] Clinical Pharmacist [facility] [phone]

## 2021-02-02 NOTE — Progress Notes (Signed)
Chronic Care Management Pharmacy Note  02/02/2021 Name:  Michelle [name] MRN:  [id number] DOB:  10/26/1951  Subjective: Michelle [name] is an 70 y.o. year old female who is a primary patient of [name], MD.  The CCM team was consulted for assistance with disease management and care coordination needs.    Engaged with patient by telephone for follow up visit in response to provider referral for pharmacy case management and/or care coordination services.   Consent to Services:  The patient was given the following information about Chronic Care Management services today, agreed to services, and gave verbal consent: 1. CCM service includes personalized support from designated clinical staff supervised by the primary care provider, including individualized plan of care and coordination with other care providers 2. 24/7 contact phone numbers for assistance for urgent and routine care needs. 3. Service will only be billed when office clinical staff spend 20 minutes or more in a month to coordinate care. 4. Only one practitioner may furnish and bill the service in a calendar month. 5.The patient may stop CCM services at any time (effective at the end of the month) by phone call to the office staff. 6. The patient will be responsible for cost sharing (co-pay) of up to 20% of the service fee (after annual deductible is met). Patient agreed to services and consent obtained.  Patient Care Team: [name], MD as PCP - General (Internal Medicine) Charlton Haws, [facility] as Pharmacist (Pharmacist)  Recent office visits: 12/20/20 Dr Ronnald Ramp OV: acute visit for head injury after fall. Ordered CT scan which was normal.  11/01/20 Dr Ronnald Ramp OV: hospital f/u. K and Mg normal, can stop mag and K supplement. Referred for lung cancer screening CT. Started dronabinol for cachexia (not filled). Referred to nephrology.  08/15/20 Dr Ronnald Ramp OV: 3 wk hx of abd pain/cramping, alternating constipation/diarhhea.  Started dicylcomine 10 mg  Recent consult visits: None in previous 6 months.  Hospital visits: 09/29/20 hospitalization: lightheadedness, presyncopal episode. Likely d/t dehydration w/ poor oral intake after recent COVID infection. Treated with IV fluids. Home health concerned with lack of food. Declines SNF/ALF. APS following.  09/22/20-09/25/20 hospitalization: COVID positive (GI sx), dehydration, hypotension, AKI. Given monoclonal antibody infusion for COVID. Stopped Edarbyclor at discharge.  Objective:  Lab Results  Component Value Date   CREATININE 1.15 11/01/2020   BUN 9 11/01/2020   GFR 48.20 (L) 11/01/2020   GFRNONAA 54 (L) 10/02/2020   GFRAA 64 07/12/2020   NA 148 (H) 11/01/2020   K 5.3 (H) 11/01/2020   CALCIUM 9.9 11/01/2020   CO2 28 11/01/2020    Lab Results  Component Value Date/Time   GFR 48.20 (L) 11/01/2020 02:32 PM   GFR 50.71 (L) 01/13/2020 01:44 PM    Last diabetic Eye exam: No results found for: HMDIABEYEEXA  Last diabetic Foot exam: No results found for: HMDIABFOOTEX   Lab Results  Component Value Date   CHOL 157 01/13/2020   HDL 44.20 01/13/2020   LDLCALC 85 01/13/2020   LDLDIRECT 113 (H) 06/16/2008   TRIG 139.0 01/13/2020   CHOLHDL 4 01/13/2020    Hepatic Function Latest Ref Rng & Units 09/25/2020 09/22/2020 09/22/2020  Total Protein 6.5 - 8.1 g/dL 4.6(L) 4.6(L) 4.6(L)  Albumin 3.5 - 5.0 g/dL 2.4(L) 2.6(L) 2.6(L)  AST 15 - 41 U/L 25 22 20   ALT 0 - 44 U/L 14 12 11   Alk Phosphatase 38 - 126 U/L 26(L) 31(L) 33(L)  Total Bilirubin 0.3 - 1.2  mg/dL 0.1(L) 0.6 0.6  Bilirubin, Direct 0.0 - 0.2 mg/dL - <0.1 -    Lab Results  Component Value Date/Time   TSH 1.420 09/22/2020 02:30 AM   TSH 2.01 07/12/2020 02:38 PM   TSH 0.36 01/13/2020 01:44 PM    CBC Latest Ref Rng & Units 11/01/2020 10/02/2020 10/01/2020  WBC 4.0 - 10.5 K/uL 5.9 5.1 6.0  Hemoglobin 12.0 - 15.0 g/dL 13.6 9.6(L) 11.5(L)  Hematocrit 36.0 - 46.0 % 41.6 30.5(L) 35.3(L)  Platelets  150.0 - 400.0 K/uL 272.0 335 435(H)    No results found for: VD25OH  Clinical ASCVD: No  The 10-year ASCVD risk score Mikey Bussing DC Jr., et al., 2013) is: 8.7%   Values used to calculate the score:     Age: 15 years     Sex: Female     Is Non-Hispanic African American: No     Diabetic: No     Tobacco smoker: No     Systolic Blood Pressure: 301 mmHg     Is BP treated: No     HDL Cholesterol: 44.2 mg/dL     Total Cholesterol: 157 mg/dL    Depression screen [facility] 2/9 12/02/2020 11/01/2020 07/12/2020  Decreased Interest 0 0 0  Down, Depressed, Hopeless 0 1 0  PHQ - 2 Score 0 1 0  Altered sleeping - 0 1  Tired, decreased energy - 0 1  Change in appetite - 0 0  Feeling bad or failure about yourself  - 0 0  Trouble concentrating - 0 0  Moving slowly or fidgety/restless - 0 -  Suicidal thoughts - 0 0  PHQ-9 Score - 1 2  Difficult doing work/chores - - Not difficult at all  Some recent data might be hidden    GAD 7 : Generalized Anxiety Score 12/02/2019 10/29/2017  Nervous, Anxious, on Edge 1 3  Control/stop worrying 0 1  Worry too much - different things 0 2  Trouble relaxing 0 0  Restless 1 1  Easily annoyed or irritable 0 2  Afraid - awful might happen 0 0  Total GAD 7 Score 2 9  Anxiety Difficulty Not difficult at all -    Social History   Tobacco Use  Smoking Status Former Smoker  . Packs/day: 1.00  . Years: 43.00  . Pack years: 43.00  . Types: Cigarettes  . Quit date: 11/02/2019  . Years since quitting: 1.2  Smokeless Tobacco Never Used  Tobacco Comment   Quit smoking almost 3 years ago (per pt on 06/27/2017)   BP Readings from Last 3 Encounters:  12/20/20 124/80  12/02/20 140/70  11/01/20 132/84   Pulse Readings from Last 3 Encounters:  12/20/20 71  12/02/20 68  11/01/20 96   Wt Readings from Last 3 Encounters:  12/20/20 94 lb (42.6 kg)  12/02/20 87 lb 9.6 oz (39.7 kg)  11/01/20 90 lb (40.8 kg)    Assessment/Interventions: Review of patient past medical  history, allergies, medications, health status, including review of consultants reports, laboratory and other test data, was performed as part of comprehensive evaluation and provision of chronic care management services.   SDOH:  (Social Determinants of Health) assessments and interventions performed: Yes   CCM Care Plan  Allergies  Allergen Reactions  . Aricept [Donepezil] Other (See Comments)    bradycardia  . Penicillins Rash    Has patient had a PCN reaction causing immediate rash, facial/tongue/throat swelling, SOB or lightheadedness with hypotension: Yes Has patient had a PCN reaction causing severe  rash involving mucus membranes or skin necrosis: No Has patient had a PCN reaction that required hospitalization: No Has patient had a PCN reaction occurring within the last 10 years: No If all of the above answers are "NO", then may proceed with Cephalosporin use.     Medications Reviewed Today    Reviewed by Charlton Haws, [facility] (Pharmacist) on 02/02/21 at 1633  Med List Status: <None>  Medication Order Taking? Sig Documenting Provider Last Dose Status Informant  albuterol (VENTOLIN HFA) 108 (90 Base) MCG/ACT inhaler [id number] Yes Inhale 2 puffs into the lungs every 6 (six) hours as needed for wheezing or shortness of breath. Thurnell Lose, MD Taking Active   amitriptyline (ELAVIL) 50 MG tablet [id number] Yes TAKE 1 TABLET BY MOUTH EVERYDAY AT BEDTIME  Patient taking differently: Take 50 mg by mouth at bedtime as needed.   [name], MD Taking Active   aspirin 81 MG chewable tablet [id number] Yes Chew 81 mg by mouth daily.  [provider] Taking Active Other  levothyroxine (SYNTHROID) 50 MCG tablet [id number] Yes Take 1 tablet (50 mcg total) by mouth daily. [name], MD Taking Active   sertraline (ZOLOFT) 100 MG tablet [id number] Yes Take 1 tablet (100 mg total) by mouth daily. [name], MD Taking Active   simvastatin (ZOCOR) 10 MG tablet [id number]  Yes Take 1 tablet (10 mg total) by mouth daily. [name], MD Taking Active   triamcinolone ointment (KENALOG) 0.1 % [id number] Yes Apply topically 2 (two) times daily. [name], MD Taking Active           Patient Active Problem List   Diagnosis Date Noted  . Head trauma 12/20/2020  . Intractable acute post-traumatic headache 12/20/2020  . Manifestations of thiamine deficiency 11/04/2020  . Deficiency anemia 11/01/2020  . Hypomagnesemia 11/01/2020  . Hypokalemia 11/01/2020  . Tobacco abuse 11/01/2020  . Cachexia ([place]) 11/01/2020  . Vertigo 10/02/2020  . Irritable bowel syndrome with both constipation and diarrhea 08/15/2020  . BRBPR (bright red blood per rectum) 07/12/2020  . Stage 3b chronic kidney disease ([place]) 01/14/2020  . Tetralogy of Fallot s/p repair 12/04/2019  . RBBB 12/04/2019  . Current mild episode of major depressive disorder without prior episode ([place]) 10/28/2017  . S/P surgical pulmonary valve replacement 03/05/2017  . S/P TVR (tricuspid valve repair) 03/05/2017  . Status post atrial septal defect repair 01/25/2017  . Moderate tricuspid regurgitation 01/01/2017  . Acquired hypothyroidism 11/08/2016  . TOF (tetralogy of Fallot) 09/27/2016  . Visit for screening mammogram 07/30/2016  . Osteoporosis 07/30/2016  . Mononeuropathy of right lower extremity 11/30/2014  . Health care maintenance 11/10/2014  . Congenital heart disease in adult 09/28/2014  . Essential hypertension 09/17/2014  . Anxiety state 09/23/2012  . 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 Hyperlipidemia LDL goal <100 03/19/2007    Immunization History  Administered Date(s) Administered  . Fluad Quad(high Dose 65+) 08/11/2019  . Influenza Split 12/02/2012  . Influenza Whole 10/21/2007, 11/08/2010  . Influenza, High Dose Seasonal PF 10/11/2015, 08/16/2016, 08/14/2017, 01/28/2019  . Influenza,inj,Quad PF,6+ Mos 09/06/2014, 08/15/2020  . Pneumococcal Conjugate-13 07/30/2016  .  Pneumococcal Polysaccharide-23 04/19/2015, 11/07/2016  . Td 04/16/2005  . Tdap 08/24/2015    Conditions to be addressed/monitored:  Hyperlipidemia, Depression, Anxiety and Osteoporosis  Care Plan : [place]  Updates made by Charlton Haws, RPH since 02/02/2021 12:00 AM    Problem: Hyperlipidemia, Depression, Anxiety and Osteoporosis   Priority: High  Long-Range Goal: Disease management   Start Date: 02/02/2021  Expected End Date: 08/02/2021  This Visit's Progress: On track  Priority: High  Note:    Current Barriers:  . Unable to independently monitor therapeutic efficacy . Unable to self administer medications as prescribed  Pharmacist Clinical Goal(s):  Marland Kitchen Over the next 90 days, patient will achieve adherence to monitoring guidelines and medication adherence to achieve therapeutic efficacy . achieve ability to self administer medications as prescribed through use of pill packs as evidenced by patient report through collaboration with PharmD and provider.   Interventions: . 1:1 collaboration with [name], MD regarding development and update of comprehensive plan of care as evidenced by provider attestation and co-signature . Inter-disciplinary care team collaboration (see longitudinal plan of care) . Comprehensive medication review performed; medication list updated in electronic medical record  Hyperlipidemia / Cardiovascular risk reduction (LDL goal < 100) Current regimen:  ? Simvastatin 10 mg daily  ? Aspirin 81 mg daily  Interventions: ? Discussed cholesterol goals and benefits of medication for prevention of heart attack / stroke Patient self care activities  ? Continue medication as prescribed ? Continue low cholesterol diet   Depression / Anxiety / Insomnia Current regimen:  o Sertraline 100 mg daily o Amitriptyline 50 mg HS as needed Interventions: o Discussed benefits of medications. Pt denies side effects o May consider mirtazapine  in the future for appetite stimulation Patient self care activities o Continue current medications  Osteoporosis Current regimen:  ? No medications Interventions: ? Recommend calcium 1200 mg/day and Vitamin D 1000 units/day from diet and/or supplement Patient self care activities ? Start calcium-Vitamin D supplement for bone health  Patient Goals/Self-Care Activities . Over the next 90 days, patient will:  - take medications as prescribed focus on medication adherence by pill packs  Follow Up Plan: Telephone follow up appointment with care management team member scheduled for: 3 months      Medication Assistance: None required.  Patient affirms current coverage meets needs.  Patient's preferred pharmacy is:  [facility] - [place], [place] - [address] [place] [address] Phone: [phone] Fax: [phone]  Uses pill box? No - prefers bottles Pt endorses 100% compliance  We discussed: Verbal consent obtained for [facility] enhanced pharmacy services (medication synchronization, adherence packaging, delivery coordination). A medication sync plan was created to allow patient to get all medications delivered once every 30 to 90 days per patient preference. Patient understands they have freedom to choose pharmacy and clinical pharmacist will coordinate care between all prescribers and [facility].  Patient decided to: Utilize [facility] for medication synchronization, packaging and delivery  Care Plan and Follow Up Patient Decision:  Patient agrees to Care Plan and Follow-up.  Plan: Telephone follow up appointment with care management team member scheduled for:  3 months  Charlene Brooke, PharmD, [facility] Clinical Pharmacist [facility] at [facility] [phone]

## 2021-02-08 ENCOUNTER — Telehealth: Payer: Self-pay | Admitting: Pharmacist

## 2021-02-08 NOTE — Progress Notes (Incomplete)
Chronic Care Management Pharmacy Assistant   Name: Michelle [name]  MRN: [id number] DOB: 11/06/1951  Reason for Encounter: Medication Review  PCP : [name], MD  Allergies:   Allergies  Allergen Reactions  . Aricept [Donepezil] Other (See Comments)    bradycardia  . Penicillins Rash    Has patient had a PCN reaction causing immediate rash, facial/tongue/throat swelling, SOB or lightheadedness with hypotension: Yes Has patient had a PCN reaction causing severe rash involving mucus membranes or skin necrosis: No Has patient had a PCN reaction that required hospitalization: No Has patient had a PCN reaction occurring within the last 10 years: No If all of the above answers are "NO", then may proceed with Cephalosporin use.     Medications: Outpatient Encounter Medications as of 02/08/2021  Medication Sig  . albuterol (VENTOLIN HFA) 108 (90 Base) MCG/ACT inhaler Inhale 2 puffs into the lungs every 6 (six) hours as needed for wheezing or shortness of breath.  Marland Kitchen amitriptyline (ELAVIL) 50 MG tablet TAKE 1 TABLET BY MOUTH EVERYDAY AT BEDTIME (Patient taking differently: Take 50 mg by mouth at bedtime as needed.)  . aspirin 81 MG chewable tablet Chew 81 mg by mouth daily.   Marland Kitchen levothyroxine (SYNTHROID) 50 MCG tablet Take 1 tablet (50 mcg total) by mouth daily.  . sertraline (ZOLOFT) 100 MG tablet Take 1 tablet (100 mg total) by mouth daily.  . simvastatin (ZOCOR) 10 MG tablet Take 1 tablet (10 mg total) by mouth daily.  Marland Kitchen triamcinolone ointment (KENALOG) 0.1 % Apply topically 2 (two) times daily.   No facility-administered encounter medications on file as of 02/08/2021.    Current Diagnosis: Patient Active Problem List   Diagnosis Date Noted  . Head trauma 12/20/2020  . Intractable acute post-traumatic headache 12/20/2020  . Manifestations of thiamine deficiency 11/04/2020  . Deficiency anemia 11/01/2020  . Hypomagnesemia 11/01/2020  . Hypokalemia 11/01/2020  . Tobacco  abuse 11/01/2020  . Cachexia ([place]) 11/01/2020  . Vertigo 10/02/2020  . Irritable bowel syndrome with both constipation and diarrhea 08/15/2020  . BRBPR (bright red blood per rectum) 07/12/2020  . Stage 3b chronic kidney disease ([place]) 01/14/2020  . Tetralogy of Fallot s/p repair 12/04/2019  . RBBB 12/04/2019  . Current mild episode of major depressive disorder without prior episode ([place]) 10/28/2017  . S/P surgical pulmonary valve replacement 03/05/2017  . S/P TVR (tricuspid valve repair) 03/05/2017  . Status post atrial septal defect repair 01/25/2017  . Moderate tricuspid regurgitation 01/01/2017  . Acquired hypothyroidism 11/08/2016  . TOF (tetralogy of Fallot) 09/27/2016  . Visit for screening mammogram 07/30/2016  . Osteoporosis 07/30/2016  . Mononeuropathy of right lower extremity 11/30/2014  . Health care maintenance 11/10/2014  . Congenital heart disease in adult 09/28/2014  . Essential hypertension 09/17/2014  . Anxiety state 09/23/2012  . 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 Hyperlipidemia LDL goal <100 03/19/2007    Reviewed chart for medication changes ahead of medication coordination call.  No OVs, Consults, or hospital visits since last care coordination call/Pharmacist visit. (If appropriate, list visit date, provider name)  No medication changes indicated OR if recent visit, treatment plan here.  BP Readings from Last 3 Encounters:  12/20/20 124/80  12/02/20 140/70  11/01/20 132/84    No results found for: HGBA1C    Patient obtains medications through Adherence Packaging  30 Days    Patient is due for next adherence delivery on: 02-11-21 Called patient and reviewed medications   [name] ,[place]  Pharmacist Assistant [phone]  Follow-Up:  Pharmacist Review

## 2021-02-14 ENCOUNTER — Telehealth: Payer: Self-pay | Admitting: Pharmacist

## 2021-02-14 NOTE — Progress Notes (Signed)
Chronic Care Management Pharmacy Assistant   Name: Michelle [name]  MRN: [id number] DOB: 1951-07-05  Reason for Encounter: Medication Review  PCP : Michelle Lima, MD  Allergies:   Allergies  Allergen Reactions  . Aricept [Donepezil] Other (See Comments)    bradycardia  . Penicillins Rash    Has patient had a PCN reaction causing immediate rash, facial/tongue/throat swelling, SOB or lightheadedness with hypotension: Yes Has patient had a PCN reaction causing severe rash involving mucus membranes or skin necrosis: No Has patient had a PCN reaction that required hospitalization: No Has patient had a PCN reaction occurring within the last 10 years: No If all of the above answers are "NO", then may proceed with Cephalosporin use.     Medications: Outpatient Encounter Medications as of 02/14/2021  Medication Sig  . albuterol (VENTOLIN HFA) 108 (90 Base) MCG/ACT inhaler Inhale 2 puffs into the lungs every 6 (six) hours as needed for wheezing or shortness of breath.  Marland Kitchen amitriptyline (ELAVIL) 50 MG tablet TAKE 1 TABLET BY MOUTH EVERYDAY AT BEDTIME (Patient taking differently: Take 50 mg by mouth at bedtime as needed.)  . aspirin 81 MG chewable tablet Chew 81 mg by mouth daily.   Marland Kitchen levothyroxine (SYNTHROID) 50 MCG tablet Take 1 tablet (50 mcg total) by mouth daily.  . sertraline (ZOLOFT) 100 MG tablet Take 1 tablet (100 mg total) by mouth daily.  . simvastatin (ZOCOR) 10 MG tablet Take 1 tablet (10 mg total) by mouth daily.  Marland Kitchen triamcinolone ointment (KENALOG) 0.1 % Apply topically 2 (two) times daily.   No facility-administered encounter medications on file as of 02/14/2021.    Current Diagnosis: Patient Active Problem List   Diagnosis Date Noted  . Head trauma 12/20/2020  . Intractable acute post-traumatic headache 12/20/2020  . Manifestations of thiamine deficiency 11/04/2020  . Deficiency anemia 11/01/2020  . Hypomagnesemia 11/01/2020  . Hypokalemia 11/01/2020  . Tobacco  abuse 11/01/2020  . Cachexia ([place]) 11/01/2020  . Vertigo 10/02/2020  . Irritable bowel syndrome with both constipation and diarrhea 08/15/2020  . BRBPR (bright red blood per rectum) 07/12/2020  . Stage 3b chronic kidney disease ([place]) 01/14/2020  . Tetralogy of Fallot s/p repair 12/04/2019  . RBBB 12/04/2019  . Current mild episode of major depressive disorder without prior episode ([place]) 10/28/2017  . S/P surgical pulmonary valve replacement 03/05/2017  . S/P TVR (tricuspid valve repair) 03/05/2017  . Status post atrial septal defect repair 01/25/2017  . Moderate tricuspid regurgitation 01/01/2017  . Acquired hypothyroidism 11/08/2016  . TOF (tetralogy of Fallot) 09/27/2016  . Visit for screening mammogram 07/30/2016  . Osteoporosis 07/30/2016  . Mononeuropathy of right lower extremity 11/30/2014  . Health care maintenance 11/10/2014  . Congenital heart disease in adult 09/28/2014  . Essential hypertension 09/17/2014  . Anxiety state 09/23/2012  . 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 Hyperlipidemia LDL goal <100 03/19/2007    Reviewed chart for medication changes ahead of medication coordination call.  No OVs, Consults, or hospital visits since last care coordination call/Pharmacist visit. (If appropriate, list visit date, provider name)  No medication changes indicated OR if recent visit, treatment plan here.  BP Readings from Last 3 Encounters:  12/20/20 124/80  12/02/20 140/70  11/01/20 132/84    No results found for: HGBA1C   Patient obtains medications through Vials  30 Days   Last adherence delivery included: Sertraline 100mg  Tab Take one tab at noon Levothyroxine 50 mcg Tab  Take one tab at noon Simvastatin  10mg  Tab Take one tab every day at bedtime Dronabinol 2.5mg    Take one cap by mouth at noon and take one cap every evening before meals   Called patient and reviewed medications . Patient stated she is not needing medications at this time . Informed patient  about switching to packaging once she needs a refill on medications. Patient stated she was interested and will talk more about switching to packaging once she needs a refill .    Patient declined the following medications : Sertraline 100mg  Tab Take one tab at noon Levothyroxine 50 mcg Tab  Take one tab at noon Simvastatin 10mg  Tab Take one tab every day at bedtime Dronabinol 2.5mg    Take one cap by mouth at noon and take one cap every evening before meals      Patient went over medications with me and she still has half bottles on all medications.  [name] ,[place] Pharmacist Assistant [phone]   Follow-Up:  Pharmacist Review

## 2021-02-21 ENCOUNTER — Ambulatory Visit: Payer: Medicare HMO | Admitting: Internal Medicine

## 2021-02-23 NOTE — Addendum Note (Signed)
Addended by: Sheral Flow on: 02/23/2021 09:24 AM   Modules accepted: Level of Service, SmartSet

## 2021-03-06 ENCOUNTER — Other Ambulatory Visit: Payer: Self-pay | Admitting: Internal Medicine

## 2021-03-06 DIAGNOSIS — E785 Hyperlipidemia, unspecified: Secondary | ICD-10-CM

## 2021-03-06 DIAGNOSIS — E039 Hypothyroidism, unspecified: Secondary | ICD-10-CM

## 2021-03-13 ENCOUNTER — Ambulatory Visit: Payer: Medicare HMO | Admitting: Internal Medicine

## 2021-03-15 ENCOUNTER — Telehealth: Payer: Self-pay | Admitting: Pharmacist

## 2021-03-15 NOTE — Progress Notes (Addendum)
    Chronic Care Management Pharmacy Assistant   Name: Michelle [name]  MRN: [id number] DOB: [birth year]   Reason for Encounter: Medication Coordination Call    Recent office visits:  None ID  Recent consult visits:  None ID  Hospital visits:  None in previous 6 months  Medications: Outpatient Encounter Medications as of 03/15/2021  Medication Sig   albuterol (VENTOLIN HFA) 108 (90 Base) MCG/ACT inhaler Inhale 2 puffs into the lungs every 6 (six) hours as needed for wheezing or shortness of breath.   amitriptyline (ELAVIL) 50 MG tablet TAKE 1 TABLET BY MOUTH EVERYDAY AT BEDTIME (Patient taking differently: Take 50 mg by mouth at bedtime as needed.)   aspirin 81 MG chewable tablet Chew 81 mg by mouth daily.    levothyroxine (SYNTHROID) 50 MCG tablet Take 1 tablet (50 mcg total) by mouth daily.   sertraline (ZOLOFT) 100 MG tablet Take 1 tablet (100 mg total) by mouth daily.   simvastatin (ZOCOR) 10 MG tablet Take 1 tablet (10 mg total) by mouth daily.   triamcinolone ointment (KENALOG) 0.1 % Apply topically 2 (two) times daily.   No facility-administered encounter medications on file as of 03/15/2021.    Reviewed chart for medication changes ahead of medication coordination call.  No OVs, Consults, or hospital visits since last care coordination call/Pharmacist visit. (If appropriate, list visit date, provider name)  No medication changes indicated OR if recent visit, treatment plan here.  BP Readings from Last 3 Encounters:  12/20/20 124/80  12/02/20 140/70  11/01/20 132/84    No results found for: HGBA1C   Patient obtains medications through Adherence Packaging  30 Days   Last adherence delivery included:   Levothyroxine 50 mcg take 1 tab daily Sertraline 100 mg take 1 tab daily Simvastatin 10 mg take 1 tab daily  Patient is due for next adherence delivery on: 03/16/21 Called patient and reviewed medications and coordinated delivery.  This delivery to  include: Levothyroxine 50 mcg take 1 tab daily Sertraline 100 mg take 1 tab daily Simvastatin 10 mg take 1 tab daily Ca 500 mg - Vitamin D 1 tab daily   Confirmed delivery date of 03/16/21, advised patient that pharmacy will contact them the morning of delivery.   [place] Pharmacist Assistant [phone]

## 2021-03-15 NOTE — Telephone Encounter (Signed)
Patient has bottles of donepezil she is not sure if she should be taking. Per chart review this was discontinued in 11/2019 by PCP in conjunction with neurologist due to concern for bradycardia.  Instructed patient not to take donepezil. It will not be included in pill packs.

## 2021-03-15 NOTE — Addendum Note (Signed)
Addended by: Charlton Haws on: 03/15/2021 11:52 AM   Modules accepted: Orders

## 2021-03-28 ENCOUNTER — Encounter: Payer: Self-pay | Admitting: Internal Medicine

## 2021-03-28 ENCOUNTER — Ambulatory Visit (INDEPENDENT_AMBULATORY_CARE_PROVIDER_SITE_OTHER): Payer: Medicare HMO

## 2021-03-28 ENCOUNTER — Ambulatory Visit (INDEPENDENT_AMBULATORY_CARE_PROVIDER_SITE_OTHER): Payer: Medicare HMO | Admitting: Internal Medicine

## 2021-03-28 ENCOUNTER — Other Ambulatory Visit: Payer: Self-pay

## 2021-03-28 VITALS — BP 116/74 | HR 63 | Temp 98.2°F | Resp 16 | Ht 64.0 in | Wt 89.0 lb

## 2021-03-28 DIAGNOSIS — N1832 Chronic kidney disease, stage 3b: Secondary | ICD-10-CM

## 2021-03-28 DIAGNOSIS — Z0000 Encounter for general adult medical examination without abnormal findings: Secondary | ICD-10-CM

## 2021-03-28 DIAGNOSIS — E785 Hyperlipidemia, unspecified: Secondary | ICD-10-CM | POA: Diagnosis not present

## 2021-03-28 DIAGNOSIS — R079 Chest pain, unspecified: Secondary | ICD-10-CM | POA: Diagnosis not present

## 2021-03-28 DIAGNOSIS — R64 Cachexia: Secondary | ICD-10-CM | POA: Diagnosis not present

## 2021-03-28 DIAGNOSIS — R0789 Other chest pain: Secondary | ICD-10-CM

## 2021-03-28 DIAGNOSIS — Z1231 Encounter for screening mammogram for malignant neoplasm of breast: Secondary | ICD-10-CM

## 2021-03-28 DIAGNOSIS — E039 Hypothyroidism, unspecified: Secondary | ICD-10-CM

## 2021-03-28 DIAGNOSIS — I10 Essential (primary) hypertension: Secondary | ICD-10-CM

## 2021-03-28 DIAGNOSIS — R634 Abnormal weight loss: Secondary | ICD-10-CM

## 2021-03-28 DIAGNOSIS — Z0001 Encounter for general adult medical examination with abnormal findings: Secondary | ICD-10-CM

## 2021-03-28 LAB — URINALYSIS, ROUTINE W REFLEX MICROSCOPIC
Bilirubin Urine: NEGATIVE
Ketones, ur: NEGATIVE
Nitrite: NEGATIVE
Specific Gravity, Urine: 1.01 (ref 1.000–1.030)
Total Protein, Urine: NEGATIVE
Urine Glucose: NEGATIVE
Urobilinogen, UA: 0.2 (ref 0.0–1.0)
pH: 6.5 (ref 5.0–8.0)

## 2021-03-28 LAB — BASIC METABOLIC PANEL
BUN: 13 mg/dL (ref 6–23)
CO2: 31 mEq/L (ref 19–32)
Calcium: 9.1 mg/dL (ref 8.4–10.5)
Chloride: 102 mEq/L (ref 96–112)
Creatinine, Ser: 1.16 mg/dL (ref 0.40–1.20)
GFR: 47.57 mL/min — ABNORMAL LOW (ref >60.00)
Glucose, Bld: 80 mg/dL (ref 70–99)
Potassium: 4.7 mEq/L (ref 3.5–5.1)
Sodium: 141 mEq/L (ref 135–145)

## 2021-03-28 LAB — LIPID PANEL
Cholesterol: 134 mg/dL (ref 0–200)
HDL: 40.8 mg/dL (ref >39.00)
LDL Cholesterol: 72 mg/dL (ref 0–99)
NonHDL: 93.02
Total CHOL/HDL Ratio: 3
Triglycerides: 106 mg/dL (ref 0.0–149.0)
VLDL: 21.2 mg/dL (ref 0.0–40.0)

## 2021-03-28 LAB — CBC WITH DIFFERENTIAL/PLATELET
Basophils Absolute: 0.1 10*3/uL (ref 0.0–0.1)
Basophils Relative: 0.8 % (ref 0.0–3.0)
Eosinophils Absolute: 0.1 10*3/uL (ref 0.0–0.7)
Eosinophils Relative: 0.8 % (ref 0.0–5.0)
HCT: 41.6 % (ref 36.0–46.0)
Hemoglobin: 13.6 g/dL (ref 12.0–15.0)
Lymphocytes Relative: 22.5 % (ref 12.0–46.0)
Lymphs Abs: 1.5 10*3/uL (ref 0.7–4.0)
MCHC: 32.7 g/dL (ref 30.0–36.0)
MCV: 89.4 fl (ref 78.0–100.0)
Monocytes Absolute: 0.4 10*3/uL (ref 0.1–1.0)
Monocytes Relative: 5.5 % (ref 3.0–12.0)
Neutro Abs: 4.6 10*3/uL (ref 1.4–7.7)
Neutrophils Relative %: 70.4 % (ref 43.0–77.0)
Platelets: 224 10*3/uL (ref 150.0–400.0)
RBC: 4.65 Mil/uL (ref 3.87–5.11)
RDW: 15.3 % (ref 11.5–15.5)
WBC: 6.6 10*3/uL (ref 4.0–10.5)

## 2021-03-28 LAB — HEPATIC FUNCTION PANEL
ALT: 11 U/L (ref 0–35)
AST: 20 U/L (ref 0–37)
Albumin: 4 g/dL (ref 3.5–5.2)
Alkaline Phosphatase: 59 U/L (ref 39–117)
Bilirubin, Direct: 0.1 mg/dL (ref 0.0–0.3)
Total Bilirubin: 0.4 mg/dL (ref 0.2–1.2)
Total Protein: 6.9 g/dL (ref 6.0–8.3)

## 2021-03-28 LAB — MAGNESIUM: Magnesium: 2.1 mg/dL (ref 1.5–2.5)

## 2021-03-28 LAB — TSH: TSH: 1.81 u[IU]/mL (ref 0.35–4.50)

## 2021-03-28 NOTE — Progress Notes (Signed)
Subjective:  Patient ID: Michelle [name], female    DOB: 1951/10/03  Age: 70 y.o. MRN: [id number]  CC: Annual Exam and Hypothyroidism  This visit occurred during the SARS-CoV-2 public health emergency.  Safety protocols were in place, including screening questions prior to the visit, additional usage of staff PPE, and extensive cleaning of exam room while observing appropriate contact time as indicated for disinfecting solutions.    HPI Michelle [name] presents for a CPX and f/up -  She complains of a several week history of left anterior chest wall pain that she describes as a throbbing sensation that occurs at rest.  She denies cough, wheezing, dyspnea on exertion, diaphoresis, fever, or chills.  She continues to lose weight.  Outpatient Medications Prior to Visit  Medication Sig Dispense Refill  . albuterol (VENTOLIN HFA) 108 (90 Base) MCG/ACT inhaler Inhale 2 puffs into the lungs every 6 (six) hours as needed for wheezing or shortness of breath. 6.7 g 0  . amitriptyline (ELAVIL) 50 MG tablet TAKE 1 TABLET BY MOUTH EVERYDAY AT BEDTIME (Patient taking differently: Take 50 mg by mouth at bedtime as needed.) 90 tablet 1  . sertraline (ZOLOFT) 100 MG tablet Take 1 tablet (100 mg total) by mouth daily. 90 tablet 1  . triamcinolone ointment (KENALOG) 0.1 % Apply topically 2 (two) times daily. 453.6 g 2  . aspirin 81 MG chewable tablet Chew 81 mg by mouth daily.     Marland Kitchen levothyroxine (SYNTHROID) 50 MCG tablet Take 1 tablet (50 mcg total) by mouth daily. 90 tablet 1  . simvastatin (ZOCOR) 10 MG tablet Take 1 tablet (10 mg total) by mouth daily. 90 tablet 1   No facility-administered medications prior to visit.    ROS Review of Systems  Constitutional: Positive for appetite change and unexpected weight change. Negative for chills, diaphoresis, fatigue and fever.  HENT: Negative.  Negative for trouble swallowing.   Eyes: Negative.   Respiratory: Negative for cough, chest tightness, shortness  of breath and wheezing.   Cardiovascular: Positive for chest pain. Negative for palpitations and leg swelling.  Gastrointestinal: Negative for abdominal pain, constipation, diarrhea, nausea and vomiting.  Endocrine: Negative for cold intolerance and heat intolerance.  Genitourinary: Negative.  Negative for difficulty urinating, dysuria and hematuria.  Musculoskeletal: Negative.  Negative for arthralgias and myalgias.  Skin: Negative.  Negative for rash.  Neurological: Negative.  Negative for dizziness, weakness and light-headedness.  Hematological: Negative for adenopathy. Does not bruise/bleed easily.  Psychiatric/Behavioral: Negative.     Objective:  BP 116/74   Pulse 63   Temp 98.2 F (36.8 C) (Oral)   Resp 16   Ht 5\' 4"  (1.626 m)   Wt 89 lb (40.4 kg)   SpO2 94%   BMI 15.28 kg/m   BP Readings from Last 3 Encounters:  03/28/21 116/74  12/20/20 124/80  12/02/20 140/70    Wt Readings from Last 3 Encounters:  03/28/21 89 lb (40.4 kg)  12/20/20 94 lb (42.6 kg)  12/02/20 87 lb 9.6 oz (39.7 kg)    Physical Exam Vitals reviewed.  Constitutional:      General: She is not in acute distress.    Appearance: She is cachectic. She is ill-appearing. She is not toxic-appearing or diaphoretic.  HENT:     Nose: Nose normal.     Mouth/Throat:     Mouth: Mucous membranes are moist.  Eyes:     General: No scleral icterus.    Conjunctiva/sclera: Conjunctivae normal.  Cardiovascular:  Rate and Rhythm: Normal rate and regular rhythm.     Heart sounds: No murmur heard. No friction rub. No gallop.   Pulmonary:     Effort: Pulmonary effort is normal.     Breath sounds: No stridor. No wheezing, rhonchi or rales.  Chest:     Chest wall: No mass, deformity, swelling, tenderness or edema.  Breasts: Breasts are symmetrical.     Right: Normal.     Left: Normal.    Abdominal:     General: Abdomen is flat.     Palpations: There is no mass.     Tenderness: There is no abdominal  tenderness. There is no guarding.     Hernia: No hernia is present.  Musculoskeletal:        General: Normal range of motion.     Cervical back: Neck supple.     Right lower leg: No edema.     Left lower leg: No edema.  Lymphadenopathy:     Cervical: No cervical adenopathy.  Skin:    General: Skin is warm and dry.  Neurological:     General: No focal deficit present.     Mental Status: She is alert.  Psychiatric:        Mood and Affect: Mood normal.        Behavior: Behavior normal.     Lab Results  Component Value Date   WBC 6.6 03/28/2021   HGB 13.6 03/28/2021   HCT 41.6 03/28/2021   PLT 224.0 03/28/2021   GLUCOSE 80 03/28/2021   CHOL 134 03/28/2021   TRIG 106.0 03/28/2021   HDL 40.80 03/28/2021   LDLDIRECT 113 (H) 06/16/2008   LDLCALC 72 03/28/2021   ALT 11 03/28/2021   AST 20 03/28/2021   NA 141 03/28/2021   K 4.7 03/28/2021   CL 102 03/28/2021   CREATININE 1.16 03/28/2021   BUN 13 03/28/2021   CO2 31 03/28/2021   TSH 1.81 03/28/2021   INR 1.3 (H) 09/22/2020    CT Head Wo Contrast  Result Date: 12/22/2020 CLINICAL DATA:  Syncope and subsequent fall. EXAM: CT HEAD WITHOUT CONTRAST TECHNIQUE: Contiguous axial images were obtained from the base of the skull through the vertex without intravenous contrast. COMPARISON:  September 30, 2020 FINDINGS: Brain: There is mild cerebral atrophy with widening of the extra-axial spaces and stable ventricular dilatation. There are areas of decreased attenuation within the white matter tracts of the supratentorial brain, consistent with microvascular disease changes. Vascular: No hyperdense vessel or unexpected calcification. Skull: Normal. Negative for fracture or focal lesion. Sinuses/Orbits: No acute finding. Other: None. IMPRESSION: 1. Mild cerebral atrophy. 2. No acute intracranial abnormality. Electronically Signed   By: Virgina Norfolk M.D.   On: 12/22/2020 15:07   DG Chest 2 View  Result Date: 03/29/2021 CLINICAL DATA:   Chest pain EXAM: CHEST - 2 VIEW COMPARISON:  September 29, 2020 chest radiograph and chest CT August 25, 2019 FINDINGS: Lungs are somewhat hyperexpanded. No edema or airspace opacity. Nipple shadows are noted bilaterally. There are prosthetic tricuspid and pulmonic valves. There is screw and plate fixation at several sites in the sternum. Heart size and pulmonary vascularity are normal. No adenopathy. There is thoracolumbar levoscoliosis. IMPRESSION: Lungs hyperexpanded without edema or airspace opacity. Heart size normal. Status post tricuspid and pulmonic valve replacements. Electronically Signed   By: Lowella Grip III M.D.   On: 03/29/2021 16:26    Assessment & Plan:   Irisha was seen today for annual exam and hypothyroidism.  Diagnoses and all orders for this visit:  Stage 3b chronic kidney disease ([place])- Her renal function is stable.  She will continue to avoid nephrotoxic agents. -     CBC with Differential/Platelet; Future -     Basic metabolic panel; Future -     Urinalysis, Routine w reflex microscopic; Future -     Urinalysis, Routine w reflex microscopic -     Basic metabolic panel -     CBC with Differential/Platelet  Visit for screening mammogram -     MM DIGITAL SCREENING BILATERAL; Future  Acquired hypothyroidism- Her TSH is in the normal range.  She will stay on the current dose of levothyroxine. -     TSH; Future -     TSH -     levothyroxine (SYNTHROID) 50 MCG tablet; Take 1 tablet (50 mcg total) by mouth daily.  Left-sided chest wall pain- Based on her symptoms, exam, and normal chest x-ray I think this is musculoskeletal chest wall pain.  I offered her reassurance. -     DG Chest 2 View; Future  Weight loss, abnormal- The work-up is negative for secondary causes. -     DG Chest 2 View; Future  Encounter for general adult medical examination with abnormal findings- Exam completed, labs reviewed, vaccines reviewed and updated, cancer screenings addressed, patient  education was given.  Essential hypertension- Her blood pressure is adequately well controlled. -     Basic metabolic panel; Future -     Basic metabolic panel  Hyperlipidemia LDL goal <100- She has achieved her LDL goal is doing well on the statin. -     Lipid panel; Future -     Hepatic function panel; Future -     Hepatic function panel -     Lipid panel -     simvastatin (ZOCOR) 10 MG tablet; Take 1 tablet (10 mg total) by mouth daily.  Hypomagnesemia- Her magnesium level is normal now. -     Magnesium; Future -     Magnesium  Cachexia ([place])- I recommended that she take Marinol to improve her appetite and help her gain weight. -     dronabinol (MARINOL) 5 MG capsule; Take 1 capsule (5 mg total) by mouth 2 (two) times daily before lunch and supper.   I have discontinued Bonnita Nasuti I. Eccleston's aspirin. I am also having her start on dronabinol. Additionally, I am having her maintain her triamcinolone ointment, amitriptyline, albuterol, sertraline, simvastatin, and levothyroxine.  Meds ordered this encounter  Medications  . simvastatin (ZOCOR) 10 MG tablet    Sig: Take 1 tablet (10 mg total) by mouth daily.    Dispense:  90 tablet    Refill:  1  . levothyroxine (SYNTHROID) 50 MCG tablet    Sig: Take 1 tablet (50 mcg total) by mouth daily.    Dispense:  90 tablet    Refill:  1  . dronabinol (MARINOL) 5 MG capsule    Sig: Take 1 capsule (5 mg total) by mouth 2 (two) times daily before lunch and supper.    Dispense:  180 capsule    Refill:  1     Follow-up: Return in about 3 months (around 06/27/2021).  [name], MD

## 2021-03-28 NOTE — Patient Instructions (Signed)
Health Maintenance, Female Adopting a healthy lifestyle and getting preventive care are important in promoting health and wellness. Ask your health care provider about:  The right schedule for you to have regular tests and exams.  Things you can do on your own to prevent diseases and keep yourself healthy. What should I know about diet, weight, and exercise? Eat a healthy diet  Eat a diet that includes plenty of vegetables, fruits, low-fat dairy products, and lean protein.  Do not eat a lot of foods that are high in solid fats, added sugars, or sodium.   Maintain a healthy weight Body mass index (BMI) is used to identify weight problems. It estimates body fat based on height and weight. Your health care provider can help determine your BMI and help you achieve or maintain a healthy weight. Get regular exercise Get regular exercise. This is one of the most important things you can do for your health. Most adults should:  Exercise for at least 150 minutes each week. The exercise should increase your heart rate and make you sweat (moderate-intensity exercise).  Do strengthening exercises at least twice a week. This is in addition to the moderate-intensity exercise.  Spend less time sitting. Even light physical activity can be beneficial. Watch cholesterol and blood lipids Have your blood tested for lipids and cholesterol at 71 years of age, then have this test every 5 years. Have your cholesterol levels checked more often if:  Your lipid or cholesterol levels are high.  You are older than 71 years of age.  You are at high risk for heart disease. What should I know about cancer screening? Depending on your health history and family history, you may need to have cancer screening at various ages. This may include screening for:  Breast cancer.  Cervical cancer.  Colorectal cancer.  Skin cancer.  Lung cancer. What should I know about heart disease, diabetes, and high blood  pressure? Blood pressure and heart disease  High blood pressure causes heart disease and increases the risk of stroke. This is more likely to develop in people who have high blood pressure readings, are of African descent, or are overweight.  Have your blood pressure checked: ? Every 3-5 years if you are 18-39 years of age. ? Every year if you are 40 years old or older. Diabetes Have regular diabetes screenings. This checks your fasting blood sugar level. Have the screening done:  Once every three years after age 40 if you are at a normal weight and have a low risk for diabetes.  More often and at a younger age if you are overweight or have a high risk for diabetes. What should I know about preventing infection? Hepatitis B If you have a higher risk for hepatitis B, you should be screened for this virus. Talk with your health care provider to find out if you are at risk for hepatitis B infection. Hepatitis C Testing is recommended for:  Everyone born from 1945 through 1965.  Anyone with known risk factors for hepatitis C. Sexually transmitted infections (STIs)  Get screened for STIs, including gonorrhea and chlamydia, if: ? You are sexually active and are younger than 71 years of age. ? You are older than 71 years of age and your health care provider tells you that you are at risk for this type of infection. ? Your sexual activity has changed since you were last screened, and you are at increased risk for chlamydia or gonorrhea. Ask your health care provider   if you are at risk.  Ask your health care provider about whether you are at high risk for HIV. Your health care provider may recommend a prescription medicine to help prevent HIV infection. If you choose to take medicine to prevent HIV, you should first get tested for HIV. You should then be tested every 3 months for as long as you are taking the medicine. Pregnancy  If you are about to stop having your period (premenopausal) and  you may become pregnant, seek counseling before you get pregnant.  Take 400 to 800 micrograms (mcg) of folic acid every day if you become pregnant.  Ask for birth control (contraception) if you want to prevent pregnancy. Osteoporosis and menopause Osteoporosis is a disease in which the bones lose minerals and strength with aging. This can result in bone fractures. If you are 65 years old or older, or if you are at risk for osteoporosis and fractures, ask your health care provider if you should:  Be screened for bone loss.  Take a calcium or vitamin D supplement to lower your risk of fractures.  Be given hormone replacement therapy (HRT) to treat symptoms of menopause. Follow these instructions at home: Lifestyle  Do not use any products that contain nicotine or tobacco, such as cigarettes, e-cigarettes, and chewing tobacco. If you need help quitting, ask your health care provider.  Do not use street drugs.  Do not share needles.  Ask your health care provider for help if you need support or information about quitting drugs. Alcohol use  Do not drink alcohol if: ? Your health care provider tells you not to drink. ? You are pregnant, may be pregnant, or are planning to become pregnant.  If you drink alcohol: ? Limit how much you use to 0-1 drink a day. ? Limit intake if you are breastfeeding.  Be aware of how much alcohol is in your drink. In the [place] one drink equals one 12 oz bottle of beer (355 mL), one 5 oz glass of wine (148 mL), or one 1 oz glass of hard liquor (44 mL). General instructions  Schedule regular health, dental, and eye exams.  Stay current with your vaccines.  Tell your health care provider if: ? You often feel depressed. ? You have ever been abused or do not feel safe at home. Summary  Adopting a healthy lifestyle and getting preventive care are important in promoting health and wellness.  Follow your health care provider's instructions about healthy  diet, exercising, and getting tested or screened for diseases.  Follow your health care provider's instructions on monitoring your cholesterol and blood pressure. This information is not intended to replace advice given to you by your health care provider. Make sure you discuss any questions you have with your health care provider. Document Revised: 11/26/2018 Document Reviewed: 11/26/2018 Elsevier Patient Education  2021 [employer] Inc.  

## 2021-03-29 ENCOUNTER — Other Ambulatory Visit: Payer: Self-pay | Admitting: Neurology

## 2021-03-29 ENCOUNTER — Encounter: Payer: Self-pay | Admitting: Internal Medicine

## 2021-03-29 ENCOUNTER — Other Ambulatory Visit: Payer: Self-pay | Admitting: Internal Medicine

## 2021-03-29 DIAGNOSIS — E785 Hyperlipidemia, unspecified: Secondary | ICD-10-CM

## 2021-03-29 DIAGNOSIS — E039 Hypothyroidism, unspecified: Secondary | ICD-10-CM

## 2021-03-29 MED ORDER — SIMVASTATIN 10 MG PO TABS: 10.0000 mg | ORAL_TABLET | Freq: Every day | ORAL | 1 refills | Status: DC

## 2021-03-29 MED ORDER — DRONABINOL 5 MG PO CAPS: 5.0000 mg | ORAL_CAPSULE | Freq: Two times a day (BID) | ORAL | 1 refills | Status: DC

## 2021-03-29 MED ORDER — LEVOTHYROXINE SODIUM 50 MCG PO TABS: 50.0000 ug | ORAL_TABLET | Freq: Every day | ORAL | 1 refills | Status: DC

## 2021-04-06 ENCOUNTER — Emergency Department (HOSPITAL_COMMUNITY): Payer: Medicare HMO

## 2021-04-06 ENCOUNTER — Encounter (HOSPITAL_COMMUNITY): Payer: Self-pay

## 2021-04-06 ENCOUNTER — Emergency Department (HOSPITAL_COMMUNITY)
Admission: EM | Admit: 2021-04-06 | Discharge: 2021-04-06 | Disposition: A | Discharge status: Home / Self Care | Payer: Medicare HMO | Attending: Emergency Medicine | Admitting: Emergency Medicine

## 2021-04-06 DIAGNOSIS — Y9289 Other specified places as the place of occurrence of the external cause: Secondary | ICD-10-CM | POA: Diagnosis not present

## 2021-04-06 DIAGNOSIS — W010XXA Fall on same level from slipping, tripping and stumbling without subsequent striking against object, initial encounter: Secondary | ICD-10-CM | POA: Diagnosis not present

## 2021-04-06 DIAGNOSIS — Z043 Encounter for examination and observation following other accident: Secondary | ICD-10-CM | POA: Diagnosis not present

## 2021-04-06 DIAGNOSIS — S0990XA Unspecified injury of head, initial encounter: Secondary | ICD-10-CM | POA: Diagnosis not present

## 2021-04-06 DIAGNOSIS — I10 Essential (primary) hypertension: Secondary | ICD-10-CM | POA: Diagnosis not present

## 2021-04-06 DIAGNOSIS — M545 Low back pain, unspecified: Secondary | ICD-10-CM | POA: Diagnosis not present

## 2021-04-06 DIAGNOSIS — R519 Headache, unspecified: Secondary | ICD-10-CM | POA: Diagnosis not present

## 2021-04-06 DIAGNOSIS — Z87891 Personal history of nicotine dependence: Secondary | ICD-10-CM | POA: Insufficient documentation

## 2021-04-06 DIAGNOSIS — M4134 Thoracogenic scoliosis, thoracic region: Secondary | ICD-10-CM | POA: Diagnosis not present

## 2021-04-06 DIAGNOSIS — R4182 Altered mental status, unspecified: Secondary | ICD-10-CM | POA: Diagnosis not present

## 2021-04-06 DIAGNOSIS — M48061 Spinal stenosis, lumbar region without neurogenic claudication: Secondary | ICD-10-CM | POA: Diagnosis not present

## 2021-04-06 DIAGNOSIS — M4802 Spinal stenosis, cervical region: Secondary | ICD-10-CM | POA: Diagnosis not present

## 2021-04-06 DIAGNOSIS — W19XXXA Unspecified fall, initial encounter: Secondary | ICD-10-CM

## 2021-04-06 LAB — CBC WITH DIFFERENTIAL/PLATELET
Abs Immature Granulocytes: 0.03 10*3/uL (ref 0.00–0.07)
Basophils Absolute: 0.1 10*3/uL (ref 0.0–0.1)
Basophils Relative: 1 %
Eosinophils Absolute: 0.1 10*3/uL (ref 0.0–0.5)
Eosinophils Relative: 1 %
HCT: 41.2 % (ref 36.0–46.0)
Hemoglobin: 13.1 g/dL (ref 12.0–15.0)
Immature Granulocytes: 1 %
Lymphocytes Relative: 24 %
Lymphs Abs: 1.5 10*3/uL (ref 0.7–4.0)
MCH: 29.4 pg (ref 26.0–34.0)
MCHC: 31.8 g/dL (ref 30.0–36.0)
MCV: 92.4 fL (ref 80.0–100.0)
Monocytes Absolute: 0.5 10*3/uL (ref 0.1–1.0)
Monocytes Relative: 9 %
Neutro Abs: 4 10*3/uL (ref 1.7–7.7)
Neutrophils Relative %: 64 %
Platelets: 219 10*3/uL (ref 150–400)
RBC: 4.46 MIL/uL (ref 3.87–5.11)
RDW: 13.6 % (ref 11.5–15.5)
WBC: 6.2 10*3/uL (ref 4.0–10.5)
nRBC: 0 % (ref 0.0–0.2)

## 2021-04-06 LAB — BASIC METABOLIC PANEL
Anion gap: 6 (ref 5–15)
BUN: 11 mg/dL (ref 8–23)
CO2: 28 mmol/L (ref 22–32)
Calcium: 9.2 mg/dL (ref 8.9–10.3)
Chloride: 106 mmol/L (ref 98–111)
Creatinine, Ser: 1.32 mg/dL — ABNORMAL HIGH (ref 0.44–1.00)
GFR, Estimated: 43 mL/min — ABNORMAL LOW (ref >60)
Glucose, Bld: 85 mg/dL (ref 70–99)
Potassium: 5.1 mmol/L (ref 3.5–5.1)
Sodium: 140 mmol/L (ref 135–145)

## 2021-04-06 NOTE — ED Notes (Signed)
Attempted to call friend for patient transportation home. VM message left at home phone, cell phone not allowing for VM messages.  Lelon Perla cell [phone]                                 Hm [phone]

## 2021-04-06 NOTE — ED Notes (Signed)
Ambulated to BR and back to bed unassisted. Denies lightheadedness.

## 2021-04-06 NOTE — ED Triage Notes (Signed)
Slipped and fell, denies dizziness. Hematoma/abrasion right forehead. Patient denies LOC. C/o pain Left flank. 12 lead with T wave inversion. HR 74, BP 130/70, 97% RA, CBG 143. Denies blood thinners.

## 2021-04-06 NOTE — ED Notes (Signed)
Patient transported to X-ray 

## 2021-04-06 NOTE — Discharge Instructions (Addendum)
You were seen today for a fall.  We scanned your head, neck, back with no acute abnormalities.  Chest x-ray and pelvis x-ray also without any abnormalities.  Given the event, we do recommend you follow-up with your primary care provider in the outpatient setting given the nature of your fall for continued care planning. You may use Tylenol and ibuprofen for pain control.  Make sure you read the labels on the bottles to ensure you do not have any contraindications and to ensure the correct dosing. Return with any change in your symptoms including worsening of your headache, fevers or chills, nausea vomiting, chest pain or shortness of breath.  Thank you for the opportunity to take part of your care.
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
## 2021-04-24 ENCOUNTER — Telehealth: Payer: Medicare HMO

## 2021-04-25 ENCOUNTER — Ambulatory Visit (INDEPENDENT_AMBULATORY_CARE_PROVIDER_SITE_OTHER): Payer: Medicare Other | Admitting: Pharmacist

## 2021-04-25 ENCOUNTER — Other Ambulatory Visit: Payer: Self-pay

## 2021-04-25 DIAGNOSIS — M810 Age-related osteoporosis without current pathological fracture: Secondary | ICD-10-CM

## 2021-04-25 DIAGNOSIS — F419 Anxiety disorder, unspecified: Secondary | ICD-10-CM

## 2021-04-25 DIAGNOSIS — E785 Hyperlipidemia, unspecified: Secondary | ICD-10-CM

## 2021-04-25 DIAGNOSIS — I10 Essential (primary) hypertension: Secondary | ICD-10-CM | POA: Diagnosis not present

## 2021-04-25 DIAGNOSIS — R64 Cachexia: Secondary | ICD-10-CM

## 2021-04-25 NOTE — Progress Notes (Signed)
Chronic Care Management Pharmacy Note  04/30/2021 Name:  Michelle [name] MRN:  [id number] DOB:  March 25, 1951  Subjective: Michelle [name] is an 70 y.o. year old female who is a primary patient of [name], MD.  The CCM team was consulted for assistance with disease management and care coordination needs.    Engaged with patient by telephone for follow up visit in response to provider referral for pharmacy case management and/or care coordination services.   Consent to Services:  The patient was given information about Chronic Care Management services, agreed to services, and gave verbal consent prior to initiation of services.  Please see initial visit note for detailed documentation.   Patient Care Team: [name], MD as PCP - General (Internal Medicine) Charlton Haws, [facility] as Pharmacist (Pharmacist)   Patient lives at home with a roommate.  Recent office visits: 03/28/21 Dr Ronnald Ramp OV: chronic f/u; d/c aspirin. Start dronabinol 5 mg BID w/ meals  12/20/20 Dr Ronnald Ramp OV: acute visit for head injury after fall. Ordered CT scan which was normal.  11/01/20 Dr Ronnald Ramp OV: hospital f/u. K and Mg normal, can stop mag and K supplement. Referred for lung cancer screening CT. Started dronabinol for cachexia (not filled). Referred to nephrology.  08/15/20 Dr Ronnald Ramp OV: 3 wk hx of abd pain/cramping, alternating constipation/diarhhea. Started dicylcomine 10 mg  Recent consult visits: None in previous 6 months.  Hospital visits: 04/06/21 ED visit: mechanical fall. CT scan, EKG, labs all wnl. Stable for discharge. No med changes.  09/29/20 hospitalization: lightheadedness, presyncopal episode. Likely d/t dehydration w/ poor oral intake after recent COVID infection. Treated with IV fluids. Home health concerned with lack of food. Declines SNF/ALF. APS following.  09/22/20-09/25/20 hospitalization: COVID positive (GI sx), dehydration, hypotension, AKI. Given monoclonal antibody  infusion for COVID. Stopped Edarbyclor at discharge.  Objective:  Lab Results  Component Value Date   CREATININE 1.32 (H) 04/06/2021   BUN 11 04/06/2021   GFR 47.57 (L) 03/28/2021   GFRNONAA 43 (L) 04/06/2021   GFRAA 64 07/12/2020   NA 140 04/06/2021   K 5.1 04/06/2021   CALCIUM 9.2 04/06/2021   CO2 28 04/06/2021    Lab Results  Component Value Date/Time   GFR 47.57 (L) 03/28/2021 02:00 PM   GFR 48.20 (L) 11/01/2020 02:32 PM    Last diabetic Eye exam: No results found for: HMDIABEYEEXA  Last diabetic Foot exam: No results found for: HMDIABFOOTEX   Lab Results  Component Value Date   CHOL 134 03/28/2021   HDL 40.80 03/28/2021   LDLCALC 72 03/28/2021   LDLDIRECT 113 (H) 06/16/2008   TRIG 106.0 03/28/2021   CHOLHDL 3 03/28/2021    Hepatic Function Latest Ref Rng & Units 03/28/2021 09/25/2020 09/22/2020  Total Protein 6.0 - 8.3 g/dL 6.9 4.6(L) 4.6(L)  Albumin 3.5 - 5.2 g/dL 4.0 2.4(L) 2.6(L)  AST 0 - 37 U/L 20 25 22   ALT 0 - 35 U/L 11 14 12   Alk Phosphatase 39 - 117 U/L 59 26(L) 31(L)  Total Bilirubin 0.2 - 1.2 mg/dL 0.4 0.1(L) 0.6  Bilirubin, Direct 0.0 - 0.3 mg/dL 0.1 - <0.1    Lab Results  Component Value Date/Time   TSH 1.81 03/28/2021 02:00 PM   TSH 1.420 09/22/2020 02:30 AM   TSH 2.01 07/12/2020 02:38 PM    CBC Latest Ref Rng & Units 04/06/2021 03/28/2021 11/01/2020  WBC 4.0 - 10.5 K/uL 6.2 6.6 5.9  Hemoglobin 12.0 - 15.0 g/dL 13.1 13.6 13.6  Hematocrit 36.0 - 46.0 % 41.2 41.6 41.6  Platelets 150 - 400 K/uL 219 224.0 272.0    No results found for: VD25OH  Clinical ASCVD: No  The 10-year ASCVD risk score Mikey Bussing DC Jr., et al., 2013) is: 10.8%   Values used to calculate the score:     Age: 21 years     Sex: Female     Is Non-Hispanic African American: No     Diabetic: No     Tobacco smoker: No     Systolic Blood Pressure: 622 mmHg     Is BP treated: No     HDL Cholesterol: 40.8 mg/dL     Total Cholesterol: 134 mg/dL    Depression screen [facility] 2/9  03/28/2021 12/02/2020 11/01/2020  Decreased Interest 0 0 0  Down, Depressed, Hopeless 0 0 1  PHQ - 2 Score 0 0 1  Altered sleeping 0 - 0  Tired, decreased energy 0 - 0  Change in appetite 0 - 0  Feeling bad or failure about yourself  0 - 0  Trouble concentrating 0 - 0  Moving slowly or fidgety/restless 0 - 0  Suicidal thoughts 0 - 0  PHQ-9 Score 0 - 1  Difficult doing work/chores - - -  Some recent data might be hidden    GAD 7 : Generalized Anxiety Score 12/02/2019 10/29/2017  Nervous, Anxious, on Edge 1 3  Control/stop worrying 0 1  Worry too much - different things 0 2  Trouble relaxing 0 0  Restless 1 1  Easily annoyed or irritable 0 2  Afraid - awful might happen 0 0  Total GAD 7 Score 2 9  Anxiety Difficulty Not difficult at all -    Social History   Tobacco Use  Smoking Status Former Smoker  . Packs/day: 1.00  . Years: 43.00  . Pack years: 43.00  . Types: Cigarettes  . Quit date: 11/02/2019  . Years since quitting: 1.4  Smokeless Tobacco Never Used  Tobacco Comment   Quit smoking almost 3 years ago (per pt on 06/27/2017)   BP Readings from Last 3 Encounters:  04/06/21 134/73  03/28/21 116/74  12/20/20 124/80   Pulse Readings from Last 3 Encounters:  04/06/21 (!) 57  03/28/21 63  12/20/20 71   Wt Readings from Last 3 Encounters:  03/28/21 89 lb (40.4 kg)  12/20/20 94 lb (42.6 kg)  12/02/20 87 lb 9.6 oz (39.7 kg)   BMI Readings from Last 3 Encounters:  03/28/21 15.28 kg/m  12/20/20 16.14 kg/m  12/02/20 15.04 kg/m    Assessment/Interventions: Review of patient past medical history, allergies, medications, health status, including review of consultants reports, laboratory and other test data, was performed as part of comprehensive evaluation and provision of chronic care management services.   SDOH:  (Social Determinants of Health) assessments and interventions performed: Yes   CCM Care Plan  Allergies  Allergen Reactions  . Aricept  [Donepezil] Other (See Comments)    bradycardia  . Penicillins Rash    Has patient had a PCN reaction causing immediate rash, facial/tongue/throat swelling, SOB or lightheadedness with hypotension: Yes Has patient had a PCN reaction causing severe rash involving mucus membranes or skin necrosis: No Has patient had a PCN reaction that required hospitalization: No Has patient had a PCN reaction occurring within the last 10 years: No If all of the above answers are "NO", then may proceed with Cephalosporin use.     Medications Reviewed Today    Reviewed by  [name], MD (Physician) on 03/28/21 at 1355  Med List Status: <None>  Medication Order Taking? Sig Documenting Provider Last Dose Status Informant  albuterol (VENTOLIN HFA) 108 (90 Base) MCG/ACT inhaler [id number] Yes Inhale 2 puffs into the lungs every 6 (six) hours as needed for wheezing or shortness of breath. Thurnell Lose, MD Taking Active   amitriptyline (ELAVIL) 50 MG tablet [id number] Yes TAKE 1 TABLET BY MOUTH EVERYDAY AT BEDTIME  Patient taking differently: Take 50 mg by mouth at bedtime as needed.   [name], MD Taking Active   aspirin 81 MG chewable tablet [id number] Yes Chew 81 mg by mouth daily.  [provider] Taking Active Other  levothyroxine (SYNTHROID) 50 MCG tablet [id number] Yes Take 1 tablet (50 mcg total) by mouth daily. [name], MD Taking Active   sertraline (ZOLOFT) 100 MG tablet [id number] Yes Take 1 tablet (100 mg total) by mouth daily. [name], MD Taking Active   simvastatin (ZOCOR) 10 MG tablet [id number] Yes Take 1 tablet (10 mg total) by mouth daily. [name], MD Taking Active   triamcinolone ointment (KENALOG) 0.1 % [id number] Yes Apply topically 2 (two) times daily. [name], MD Taking Active           Patient Active Problem List   Diagnosis Date Noted  . Left-sided chest wall pain 03/28/2021  . Weight loss, abnormal 03/28/2021  . Encounter for general  adult medical examination with abnormal findings 03/28/2021  . Manifestations of thiamine deficiency 11/04/2020  . Hypomagnesemia 11/01/2020  . Tobacco abuse 11/01/2020  . Cachexia ([place]) 11/01/2020  . Vertigo 10/02/2020  . Irritable bowel syndrome with both constipation and diarrhea 08/15/2020  . BRBPR (bright red blood per rectum) 07/12/2020  . Stage 3b chronic kidney disease ([place]) 01/14/2020  . Tetralogy of Fallot s/p repair 12/04/2019  . RBBB 12/04/2019  . Current mild episode of major depressive disorder without prior episode ([place]) 10/28/2017  . S/P surgical pulmonary valve replacement 03/05/2017  . S/P TVR (tricuspid valve repair) 03/05/2017  . Status post atrial septal defect repair 01/25/2017  . Moderate tricuspid regurgitation 01/01/2017  . Acquired hypothyroidism 11/08/2016  . TOF (tetralogy of Fallot) 09/27/2016  . Visit for screening mammogram 07/30/2016  . Osteoporosis 07/30/2016  . Mononeuropathy of right lower extremity 11/30/2014  . Health care maintenance 11/10/2014  . Congenital heart disease in adult 09/28/2014  . Essential hypertension 09/17/2014  . Anxiety state 09/23/2012  . 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 Hyperlipidemia LDL goal <100 03/19/2007    Immunization History  Administered Date(s) Administered  . Fluad Quad(high Dose 65+) 08/11/2019  . Influenza Split 12/02/2012  . Influenza Whole 10/21/2007, 11/08/2010  . Influenza, High Dose Seasonal PF 10/11/2015, 08/16/2016, 08/14/2017, 01/28/2019  . Influenza,inj,Quad PF,6+ Mos 09/06/2014, 08/15/2020  . Pneumococcal Conjugate-13 07/30/2016  . Pneumococcal Polysaccharide-23 04/19/2015, 11/07/2016  . Td 04/16/2005  . Tdap 08/24/2015    Conditions to be addressed/monitored:  Hyperlipidemia, Depression, Anxiety and Osteoporosis  Care Plan : [place]  Updates made by Charlton Haws, [place] since 04/30/2021 12:00 AM    Problem: Hyperlipidemia, Depression, Anxiety and Osteoporosis    Priority: High    Long-Range Goal: Disease management   Start Date: 02/02/2021  Expected End Date: 08/02/2021  Recent Progress: On track  Priority: High  Note:   Current Barriers:  . Unable to independently monitor therapeutic efficacy . Unable to self administer medications as prescribed  Pharmacist Clinical Goal(s):  Marland Kitchen Patient  will achieve adherence to monitoring guidelines and medication adherence to achieve therapeutic efficacy . achieve ability to self administer medications as prescribed through use of pill packs as evidenced by patient report through collaboration with PharmD and provider.   Interventions: . 1:1 collaboration with [name], MD regarding development and update of comprehensive plan of care as evidenced by provider attestation and co-signature . Inter-disciplinary care team collaboration (see longitudinal plan of care) . Comprehensive medication review performed; medication list updated in electronic medical record  Hyperlipidemia / Cardiovascular risk reduction (LDL goal < 100) Controlled - LDL is at goal; pt endorses compliance, denies issues Current regimen:  ? Simvastatin 10 mg daily  ? Aspirin 81 mg daily  Interventions: ? Discussed cholesterol goals and benefits of medication for prevention of heart attack / stroke ? Recommend to continue current medication   Depression / Anxiety / Insomnia Controlled Current regimen:  o Sertraline 100 mg daily Interventions: o Discussed benefits of medications. Pt denies side effects o May consider mirtazapine in the future for appetite stimulation o Recommend to continue current medication  Cachexia/low BMI (Goal: weight gain) -Not ideally controlled - pt has not started dronabinol due to requiring PA -Current treatment  . Dronabinol 5 mg BID w/ meals -Completed PA for dronabinol with cachexia diagnosis; dronabinol is FDA-approved for anorexia related to AIDS and chemotherapy-induced nausea/vomiting -  since patient does not have either of these diagnoses, PA was denied -Plan: consider switching sertraline to mirtazapine for appetite stimulant effects  Osteoporosis Not ideally controlled  - pt is not yet taking calcium-Vitamin D supplement. Current regimen:  ? No medications Interventions: ? Recommend calcium 1200 mg/day and Vitamin D 1000 units/day from diet and/or supplement. These will be placed in pill packs for convenience.  Patient Goals/Self-Care Activities . Patient will:  - take medications as prescribed focus on medication adherence by pill packs Start calcium-vitamin D  Follow Up Plan: Telephone follow up appointment with care management team member scheduled for: 3 months      Medication Assistance: None required.  Patient affirms current coverage meets needs.  Patient's preferred pharmacy is:  [facility] - [place], [place] - [address] [place] [address] Phone: [phone] Fax: [phone]  [id number]: [phone]: [phone] Uses pill box? No - prefers bottles Pt endorses 100% compliance  We discussed: Verbal consent obtained for [facility] enhanced pharmacy services (medication synchronization, adherence packaging, delivery coordination). A medication sync plan was created to allow patient to get all medications delivered once every 30 to 90 days per patient preference. Patient understands they have freedom to choose pharmacy and clinical pharmacist will coordinate care between all prescribers and [facility].  Patient decided to: Utilize [facility] for medication synchronization, packaging and delivery  Care Plan and Follow Up Patient Decision:  Patient agrees to Care Plan and Follow-up.  Plan: Telephone follow up appointment with care management team member  scheduled for:  3 months  LCharlene Brooke PharmD, [place] CPP Clinical Pharmacist [phone]

## 2021-04-30 NOTE — Patient Instructions (Signed)
Visit Information  Phone number for Pharmacist: [phone]  Goals Addressed            This Visit's Progress   . Manage My Medicine       Timeframe:  Long-Range Goal Priority:  Medium Start Date:    04/25/21                         Expected End Date:    11/05/21                   Follow Up Date 08/15/21   - call for medicine refill 2 or 3 days before it runs out - call if I am sick and can't take my medicine - keep a list of all the medicines I take; vitamins and herbals too -Utilize [facility] for medication synchronization, packaging and delivery    Why is this important?   . These steps will help you keep on track with your medicines.   Notes:        The patient verbalized understanding of instructions, educational materials, and care plan provided today and declined offer to receive copy of patient instructions, educational materials, and care plan.  Telephone follow up appointment with pharmacy team member scheduled for: 3 months  Charlene Brooke, PharmD, [place], CPP Clinical Pharmacist [facility] [phone]

## 2021-05-02 ENCOUNTER — Other Ambulatory Visit: Payer: Self-pay | Admitting: Internal Medicine

## 2021-05-02 DIAGNOSIS — R634 Abnormal weight loss: Secondary | ICD-10-CM

## 2021-05-02 DIAGNOSIS — F320 Major depressive disorder, single episode, mild: Secondary | ICD-10-CM

## 2021-05-02 MED ORDER — MIRTAZAPINE 7.5 MG PO TABS: 7.5000 mg | ORAL_TABLET | Freq: Every day | ORAL | 1 refills | Status: DC

## 2021-05-06 ENCOUNTER — Observation Stay (HOSPITAL_COMMUNITY)
Admission: EM | Admit: 2021-05-06 | Discharge: 2021-05-08 | Disposition: A | Discharge status: Home / Self Care | Payer: Medicare Other | Attending: Internal Medicine | Admitting: Internal Medicine

## 2021-05-06 ENCOUNTER — Encounter (HOSPITAL_COMMUNITY): Payer: Self-pay

## 2021-05-06 ENCOUNTER — Other Ambulatory Visit: Payer: Self-pay

## 2021-05-06 DIAGNOSIS — R55 Syncope and collapse: Secondary | ICD-10-CM | POA: Insufficient documentation

## 2021-05-06 DIAGNOSIS — J439 Emphysema, unspecified: Secondary | ICD-10-CM | POA: Diagnosis not present

## 2021-05-06 DIAGNOSIS — R2681 Unsteadiness on feet: Secondary | ICD-10-CM | POA: Diagnosis not present

## 2021-05-06 DIAGNOSIS — E785 Hyperlipidemia, unspecified: Secondary | ICD-10-CM

## 2021-05-06 DIAGNOSIS — R64 Cachexia: Secondary | ICD-10-CM | POA: Diagnosis present

## 2021-05-06 DIAGNOSIS — I130 Hypertensive heart and chronic kidney disease with heart failure and stage 1 through stage 4 chronic kidney disease, or unspecified chronic kidney disease: Secondary | ICD-10-CM | POA: Insufficient documentation

## 2021-05-06 DIAGNOSIS — R519 Headache, unspecified: Secondary | ICD-10-CM | POA: Diagnosis not present

## 2021-05-06 DIAGNOSIS — R42 Dizziness and giddiness: Secondary | ICD-10-CM

## 2021-05-06 DIAGNOSIS — N1831 Chronic kidney disease, stage 3a: Secondary | ICD-10-CM | POA: Insufficient documentation

## 2021-05-06 DIAGNOSIS — Z20822 Contact with and (suspected) exposure to covid-19: Secondary | ICD-10-CM | POA: Diagnosis not present

## 2021-05-06 DIAGNOSIS — Z8774 Personal history of (corrected) congenital malformations of heart and circulatory system: Secondary | ICD-10-CM

## 2021-05-06 DIAGNOSIS — I10 Essential (primary) hypertension: Secondary | ICD-10-CM | POA: Diagnosis not present

## 2021-05-06 DIAGNOSIS — M6281 Muscle weakness (generalized): Secondary | ICD-10-CM | POA: Insufficient documentation

## 2021-05-06 DIAGNOSIS — S0993XA Unspecified injury of face, initial encounter: Secondary | ICD-10-CM | POA: Diagnosis not present

## 2021-05-06 DIAGNOSIS — J810 Acute pulmonary edema: Secondary | ICD-10-CM

## 2021-05-06 DIAGNOSIS — R410 Disorientation, unspecified: Secondary | ICD-10-CM | POA: Diagnosis not present

## 2021-05-06 DIAGNOSIS — R531 Weakness: Secondary | ICD-10-CM | POA: Diagnosis not present

## 2021-05-06 DIAGNOSIS — I5031 Acute diastolic (congestive) heart failure: Secondary | ICD-10-CM | POA: Diagnosis not present

## 2021-05-06 DIAGNOSIS — E039 Hypothyroidism, unspecified: Secondary | ICD-10-CM | POA: Diagnosis not present

## 2021-05-06 DIAGNOSIS — J811 Chronic pulmonary edema: Principal | ICD-10-CM | POA: Insufficient documentation

## 2021-05-06 DIAGNOSIS — Z87891 Personal history of nicotine dependence: Secondary | ICD-10-CM | POA: Insufficient documentation

## 2021-05-06 DIAGNOSIS — D72829 Elevated white blood cell count, unspecified: Secondary | ICD-10-CM | POA: Diagnosis not present

## 2021-05-06 DIAGNOSIS — G9389 Other specified disorders of brain: Secondary | ICD-10-CM | POA: Diagnosis not present

## 2021-05-06 DIAGNOSIS — J320 Chronic maxillary sinusitis: Secondary | ICD-10-CM | POA: Diagnosis not present

## 2021-05-06 DIAGNOSIS — I6523 Occlusion and stenosis of bilateral carotid arteries: Secondary | ICD-10-CM | POA: Diagnosis not present

## 2021-05-06 LAB — CBC WITH DIFFERENTIAL/PLATELET
Abs Immature Granulocytes: 0.03 10*3/uL (ref 0.00–0.07)
Basophils Absolute: 0.1 10*3/uL (ref 0.0–0.1)
Basophils Relative: 0 %
Eosinophils Absolute: 0.3 10*3/uL (ref 0.0–0.5)
Eosinophils Relative: 2 %
HCT: 40.5 % (ref 36.0–46.0)
Hemoglobin: 12.8 g/dL (ref 12.0–15.0)
Immature Granulocytes: 0 %
Lymphocytes Relative: 11 %
Lymphs Abs: 1.5 10*3/uL (ref 0.7–4.0)
MCH: 29.3 pg (ref 26.0–34.0)
MCHC: 31.6 g/dL (ref 30.0–36.0)
MCV: 92.7 fL (ref 80.0–100.0)
Monocytes Absolute: 1 10*3/uL (ref 0.1–1.0)
Monocytes Relative: 7 %
Neutro Abs: 11 10*3/uL — ABNORMAL HIGH (ref 1.7–7.7)
Neutrophils Relative %: 80 %
Platelets: 401 10*3/uL — ABNORMAL HIGH (ref 150–400)
RBC: 4.37 MIL/uL (ref 3.87–5.11)
RDW: 13.2 % (ref 11.5–15.5)
WBC: 13.8 10*3/uL — ABNORMAL HIGH (ref 4.0–10.5)
nRBC: 0 % (ref 0.0–0.2)

## 2021-05-06 LAB — RESP PANEL BY RT-PCR (FLU A&B, COVID) ARPGX2
Influenza A by PCR: NEGATIVE
Influenza B by PCR: NEGATIVE
SARS Coronavirus 2 by RT PCR: NEGATIVE

## 2021-05-06 LAB — COMPREHENSIVE METABOLIC PANEL
ALT: 20 U/L (ref 0–44)
AST: 23 U/L (ref 15–41)
Albumin: 3.1 g/dL — ABNORMAL LOW (ref 3.5–5.0)
Alkaline Phosphatase: 107 U/L (ref 38–126)
Anion gap: 7 (ref 5–15)
BUN: 19 mg/dL (ref 8–23)
CO2: 29 mmol/L (ref 22–32)
Calcium: 8.8 mg/dL — ABNORMAL LOW (ref 8.9–10.3)
Chloride: 102 mmol/L (ref 98–111)
Creatinine, Ser: 1.07 mg/dL — ABNORMAL HIGH (ref 0.44–1.00)
GFR, Estimated: 56 mL/min — ABNORMAL LOW (ref >60)
Glucose, Bld: 86 mg/dL (ref 70–99)
Potassium: 4.4 mmol/L (ref 3.5–5.1)
Sodium: 138 mmol/L (ref 135–145)
Total Bilirubin: 0.2 mg/dL — ABNORMAL LOW (ref 0.3–1.2)
Total Protein: 6.6 g/dL (ref 6.5–8.1)

## 2021-05-06 LAB — TSH: TSH: 3.716 u[IU]/mL (ref 0.350–4.500)

## 2021-05-06 NOTE — ED Provider Notes (Signed)
Emergency Medicine Provider Triage Evaluation Note  Michelle [name] , a 70 y.o. female  was evaluated in triage.  Pt complains of generalized weakness, headaches, fatigue, occasional chest pains and shortness of breath.  She states that she feels cold and that her memory is not as sharp as it used to be.  She states that the symptoms began yesterday.  However she also seems to recount to me that she has been seeing doctors for this in the past.  Review of Systems  Positive: Fatigue, malaise, headaches, intermittent chest pains shortness of breath Negative: Fevers, chills, nausea vomiting or diarrhea  Physical Exam  BP (!) 128/107 (BP Location: Right Arm)   Pulse 78   Temp 99.4 F (37.4 C) (Oral)   Resp 20   Ht 5' 4" (1.626 m)   Wt 40.4 kg   SpO2 (!) 86%   BMI 15.29 kg/m  Gen:   Awake, no distress   Resp:  Normal effort  MSK:   Moves extremities without difficulty  Other:  Patient is chronically ill-appearing 70-year-old female does not appear to be in any acute distress presently.  Speaking full sentences.  No hypoxia there was an erroneously low SPO2 obtained personally this was rechecked.  Vital signs are within normal limits.  Heart murmur present.  Medical Decision Making  Medically screening exam initiated at 8:33 PM.  Appropriate orders placed.  Michelle [name] was informed that the remainder of the evaluation will be completed by another provider, this initial triage assessment does not replace that evaluation, and the importance of remaining in the ED until their evaluation is complete.  Patient is overall nontoxic but chronically ill-appearing 70-year-old female seems to be complaining of a myriad of symptoms with questionable onset times.  Relatively poor historian in terms of timeline.  Tells me that her symptoms began yesterday however also recounts that her memory has not been as sharp over the past few weeks.  Question whether there is some dementia present.   ,   S, PA 05/06/21 2048    Haviland, Julie, MD 05/06/21 2050  
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
## 2021-05-07 ENCOUNTER — Emergency Department (HOSPITAL_COMMUNITY): Payer: Medicare Other

## 2021-05-07 ENCOUNTER — Encounter (HOSPITAL_COMMUNITY): Payer: Self-pay | Admitting: Emergency Medicine

## 2021-05-07 ENCOUNTER — Observation Stay (HOSPITAL_BASED_OUTPATIENT_CLINIC_OR_DEPARTMENT_OTHER): Payer: Medicare Other

## 2021-05-07 ENCOUNTER — Observation Stay (HOSPITAL_COMMUNITY): Payer: Medicare Other

## 2021-05-07 DIAGNOSIS — G319 Degenerative disease of nervous system, unspecified: Secondary | ICD-10-CM | POA: Diagnosis not present

## 2021-05-07 DIAGNOSIS — R531 Weakness: Secondary | ICD-10-CM | POA: Diagnosis not present

## 2021-05-07 DIAGNOSIS — R64 Cachexia: Secondary | ICD-10-CM

## 2021-05-07 DIAGNOSIS — R0609 Other forms of dyspnea: Secondary | ICD-10-CM

## 2021-05-07 DIAGNOSIS — R519 Headache, unspecified: Secondary | ICD-10-CM | POA: Diagnosis not present

## 2021-05-07 DIAGNOSIS — Z8774 Personal history of (corrected) congenital malformations of heart and circulatory system: Secondary | ICD-10-CM | POA: Diagnosis not present

## 2021-05-07 DIAGNOSIS — J810 Acute pulmonary edema: Secondary | ICD-10-CM | POA: Diagnosis not present

## 2021-05-07 DIAGNOSIS — J439 Emphysema, unspecified: Secondary | ICD-10-CM | POA: Diagnosis not present

## 2021-05-07 DIAGNOSIS — S0993XA Unspecified injury of face, initial encounter: Secondary | ICD-10-CM | POA: Diagnosis not present

## 2021-05-07 DIAGNOSIS — E785 Hyperlipidemia, unspecified: Secondary | ICD-10-CM

## 2021-05-07 DIAGNOSIS — I379 Nonrheumatic pulmonary valve disorder, unspecified: Secondary | ICD-10-CM | POA: Diagnosis not present

## 2021-05-07 DIAGNOSIS — R42 Dizziness and giddiness: Secondary | ICD-10-CM | POA: Diagnosis not present

## 2021-05-07 DIAGNOSIS — J320 Chronic maxillary sinusitis: Secondary | ICD-10-CM | POA: Diagnosis not present

## 2021-05-07 DIAGNOSIS — G9389 Other specified disorders of brain: Secondary | ICD-10-CM | POA: Diagnosis not present

## 2021-05-07 DIAGNOSIS — J811 Chronic pulmonary edema: Secondary | ICD-10-CM | POA: Diagnosis present

## 2021-05-07 DIAGNOSIS — I6523 Occlusion and stenosis of bilateral carotid arteries: Secondary | ICD-10-CM | POA: Diagnosis not present

## 2021-05-07 LAB — ECHOCARDIOGRAM COMPLETE
Area-P 1/2: 1.85 cm2
Height: 64 in
S' Lateral: 2.5 cm
Weight: 1425.05 oz

## 2021-05-07 LAB — BRAIN NATRIURETIC PEPTIDE: B Natriuretic Peptide: 340.7 pg/mL — ABNORMAL HIGH (ref 0.0–100.0)

## 2021-05-07 LAB — TROPONIN I (HIGH SENSITIVITY)
Troponin I (High Sensitivity): 15 ng/L (ref <18)
Troponin I (High Sensitivity): 17 ng/L (ref <18)

## 2021-05-07 LAB — MAGNESIUM: Magnesium: 2 mg/dL (ref 1.7–2.4)

## 2021-05-07 LAB — PHOSPHORUS: Phosphorus: 4 mg/dL (ref 2.5–4.6)

## 2021-05-07 MED ORDER — SERTRALINE HCL 100 MG PO TABS
100.0000 mg | ORAL_TABLET | Freq: Every day | ORAL | Status: DC
  Administered 2021-05-07 – 2021-05-08 (×2): 100 mg via ORAL
  Filled 2021-05-07 (×3): qty 1

## 2021-05-07 MED ORDER — FUROSEMIDE 10 MG/ML IJ SOLN
40.0000 mg | Freq: Once | INTRAMUSCULAR | Status: AC
  Administered 2021-05-07: 40 mg via INTRAVENOUS
  Filled 2021-05-07: qty 4

## 2021-05-07 MED ORDER — ALBUTEROL SULFATE (2.5 MG/3ML) 0.083% IN NEBU: 2.5000 mg | INHALATION_SOLUTION | RESPIRATORY_TRACT | Status: DC | PRN

## 2021-05-07 MED ORDER — LEVOTHYROXINE SODIUM 50 MCG PO TABS
50.0000 ug | ORAL_TABLET | Freq: Every day | ORAL | Status: DC
  Administered 2021-05-07 – 2021-05-08 (×2): 50 ug via ORAL
  Filled 2021-05-07: qty 2
  Filled 2021-05-07: qty 1

## 2021-05-07 MED ORDER — ADULT MULTIVITAMIN W/MINERALS CH
1.0000 | ORAL_TABLET | Freq: Every day | ORAL | Status: DC
  Administered 2021-05-07 – 2021-05-08 (×2): 1 via ORAL
  Filled 2021-05-07 (×2): qty 1

## 2021-05-07 MED ORDER — IBUPROFEN 400 MG PO TABS
400.0000 mg | ORAL_TABLET | Freq: Once | ORAL | Status: AC
  Administered 2021-05-07: 400 mg via ORAL
  Filled 2021-05-07: qty 1

## 2021-05-07 MED ORDER — SIMVASTATIN 20 MG PO TABS
10.0000 mg | ORAL_TABLET | Freq: Every day | ORAL | Status: DC
  Administered 2021-05-07 – 2021-05-08 (×2): 10 mg via ORAL
  Filled 2021-05-07 (×2): qty 1

## 2021-05-07 MED ORDER — LORAZEPAM 2 MG/ML IJ SOLN
0.5000 mg | Freq: Once | INTRAMUSCULAR | Status: AC | PRN
  Administered 2021-05-07: 0.5 mg via INTRAVENOUS
  Filled 2021-05-07: qty 1

## 2021-05-07 MED ORDER — ONDANSETRON HCL 4 MG PO TABS: 4.0000 mg | ORAL_TABLET | Freq: Four times a day (QID) | ORAL | Status: DC | PRN

## 2021-05-07 MED ORDER — ACETAMINOPHEN 650 MG RE SUPP: 650.0000 mg | Freq: Four times a day (QID) | RECTAL | Status: DC | PRN

## 2021-05-07 MED ORDER — ACETAMINOPHEN 500 MG PO TABS
1000.0000 mg | ORAL_TABLET | Freq: Once | ORAL | Status: AC
  Administered 2021-05-07: 1000 mg via ORAL
  Filled 2021-05-07: qty 2

## 2021-05-07 MED ORDER — ACETAMINOPHEN 325 MG PO TABS: 650.0000 mg | ORAL_TABLET | Freq: Four times a day (QID) | ORAL | Status: DC | PRN

## 2021-05-07 MED ORDER — ONDANSETRON HCL 4 MG/2ML IJ SOLN: 4.0000 mg | Freq: Four times a day (QID) | INTRAMUSCULAR | Status: DC | PRN

## 2021-05-07 NOTE — ED Notes (Signed)
Spoke with MRI - asking pt to be taken to the floor after procedure Report given to Floor RN Claiborne Billings advised that pt is in MRI - room 2W 10 C  Advised that urine is needed
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
## 2021-05-07 NOTE — Progress Notes (Signed)
Care started prior to midnight in the Emergency Room and patient was admitted early this AM after midnight by Dr. Atha Starks and I am in current agreement with his assessment and plan.  Additional changes of the plan of care been made accordingly.  The patient is a 70 year old Caucasian thin female with a past medical history significant for but not limited to tetralogy of Fallot status postsurgical repair 1958, anxiety depression, hypertension, CKD stage IIIa, dyslipidemia, hypothyroidism as well as other comorbidities who presented to the ED with a complaints.  She is extremely poor historian and history was difficult to obtain and when she was asked why she came to the hospital she reportedly had but stated that she had problems with her equilibrium and falling.  She then told the admitting physician that she has troubles with her memory and has dementia and does not been taking Aricept given that it is an allergy.  Also complained of some dyspnea.  She is evaluated and a CT of the head showed no acute process but chest x-ray did show some mild pulmonary edema.  She is given a dose of IV Lasix and hospitalist was called to admit this patient.  Given her confusion she underwent an MRI which showed no acute infarct but had incomplete study.  She is being admitted and treated for the following but not limited to:  Pulmonary edema -Chest x-ray on admission demonstrates pulmonary edema - History of tetralogy of Fallot and tobacco abuse -Currently maintaining sats on room air - BNP elevated at 340.7 - She received one-time dose of IV Lasix in the ED - Albuterol as needed - We will order echocardiogram and this is pending to be done this morning -Reassess for IV Lasix in the a.m. -Strict I's and O's and daily weights -PT OT evaluating and treating  Vertigo - Patient describes what sounds like vertigo at times - CT the head showed no acute processes - PT consulted -Fall precautions -We will check  MRI  Confusion -Check MRI as above  -continue monitor and placed on delirium precautions  Hypothyroidism - TSH on admission 3.7 - Continue home Synthroid  Anxiety and depression -Continue home sertraline   CKD 3a -Currently stable. baseline creatinine appears around 1.1 - Recommend avoiding further NSAIDs  Dyslipidemia -Continue home statin  Leukocytosis -Unclear etiology -Continue monitor for signs and symptoms of infection; currently afebrile -Repeat CBC in a.m.  Underweight/severe protein calorie malnutrition -We will consult nutritionist for further evaluation recommendations  We will continue monitor the patient's clinical response to intervention and repeat blood work and imaging in a.m and repeat chest x-ray in the morning.

## 2021-05-07 NOTE — ED Notes (Signed)
Pt was stand by assist to bathroom  Pt now back in bed - back on monitor Has call bell and blankets
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
## 2021-05-07 NOTE — Progress Notes (Signed)
  Echocardiogram 2D Echocardiogram has been performed.  Darlina Sicilian M 05/07/2021, 10:52 AM

## 2021-05-08 ENCOUNTER — Telehealth: Payer: Self-pay | Admitting: Internal Medicine

## 2021-05-08 ENCOUNTER — Observation Stay (HOSPITAL_COMMUNITY): Payer: Medicare Other

## 2021-05-08 DIAGNOSIS — R918 Other nonspecific abnormal finding of lung field: Secondary | ICD-10-CM | POA: Diagnosis not present

## 2021-05-08 DIAGNOSIS — J810 Acute pulmonary edema: Secondary | ICD-10-CM | POA: Diagnosis not present

## 2021-05-08 DIAGNOSIS — R410 Disorientation, unspecified: Secondary | ICD-10-CM

## 2021-05-08 DIAGNOSIS — Z952 Presence of prosthetic heart valve: Secondary | ICD-10-CM | POA: Diagnosis not present

## 2021-05-08 DIAGNOSIS — E785 Hyperlipidemia, unspecified: Secondary | ICD-10-CM | POA: Diagnosis not present

## 2021-05-08 DIAGNOSIS — J811 Chronic pulmonary edema: Secondary | ICD-10-CM | POA: Diagnosis not present

## 2021-05-08 DIAGNOSIS — Z8774 Personal history of (corrected) congenital malformations of heart and circulatory system: Secondary | ICD-10-CM | POA: Diagnosis not present

## 2021-05-08 DIAGNOSIS — J189 Pneumonia, unspecified organism: Secondary | ICD-10-CM

## 2021-05-08 DIAGNOSIS — R42 Dizziness and giddiness: Secondary | ICD-10-CM | POA: Diagnosis not present

## 2021-05-08 DIAGNOSIS — R64 Cachexia: Secondary | ICD-10-CM | POA: Diagnosis not present

## 2021-05-08 LAB — CBC
HCT: 39.8 % (ref 36.0–46.0)
Hemoglobin: 12.6 g/dL (ref 12.0–15.0)
MCH: 28.8 pg (ref 26.0–34.0)
MCHC: 31.7 g/dL (ref 30.0–36.0)
MCV: 91.1 fL (ref 80.0–100.0)
Platelets: 377 10*3/uL (ref 150–400)
RBC: 4.37 MIL/uL (ref 3.87–5.11)
RDW: 13.3 % (ref 11.5–15.5)
WBC: 12.7 10*3/uL — ABNORMAL HIGH (ref 4.0–10.5)
nRBC: 0 % (ref 0.0–0.2)

## 2021-05-08 LAB — PHOSPHORUS: Phosphorus: 5.1 mg/dL — ABNORMAL HIGH (ref 2.5–4.6)

## 2021-05-08 LAB — COMPREHENSIVE METABOLIC PANEL
ALT: 16 U/L (ref 0–44)
AST: 16 U/L (ref 15–41)
Albumin: 2.7 g/dL — ABNORMAL LOW (ref 3.5–5.0)
Alkaline Phosphatase: 82 U/L (ref 38–126)
Anion gap: 10 (ref 5–15)
BUN: 15 mg/dL (ref 8–23)
CO2: 27 mmol/L (ref 22–32)
Calcium: 8.7 mg/dL — ABNORMAL LOW (ref 8.9–10.3)
Chloride: 103 mmol/L (ref 98–111)
Creatinine, Ser: 0.99 mg/dL (ref 0.44–1.00)
GFR, Estimated: >60 mL/min (ref >60)
Glucose, Bld: 69 mg/dL — ABNORMAL LOW (ref 70–99)
Potassium: 4.3 mmol/L (ref 3.5–5.1)
Sodium: 140 mmol/L (ref 135–145)
Total Bilirubin: 0.6 mg/dL (ref 0.3–1.2)
Total Protein: 6.4 g/dL — ABNORMAL LOW (ref 6.5–8.1)

## 2021-05-08 LAB — MAGNESIUM: Magnesium: 1.9 mg/dL (ref 1.7–2.4)

## 2021-05-08 MED ORDER — TRIAMCINOLONE ACETONIDE 0.1 % EX CREA
1.0000 "application " | TOPICAL_CREAM | Freq: Three times a day (TID) | CUTANEOUS | Status: DC
  Administered 2021-05-08: 1 via TOPICAL
  Filled 2021-05-08: qty 15

## 2021-05-08 MED ORDER — AZITHROMYCIN 250 MG PO TABS
500.0000 mg | ORAL_TABLET | Freq: Every day | ORAL | Status: AC
  Administered 2021-05-08: 500 mg via ORAL
  Filled 2021-05-08: qty 2

## 2021-05-08 MED ORDER — CEFDINIR 300 MG PO CAPS
300.0000 mg | ORAL_CAPSULE | Freq: Two times a day (BID) | ORAL | Status: DC
  Administered 2021-05-08: 300 mg via ORAL
  Filled 2021-05-08 (×2): qty 1

## 2021-05-08 MED ORDER — ONDANSETRON HCL 4 MG PO TABS: 4.0000 mg | ORAL_TABLET | Freq: Four times a day (QID) | ORAL | 0 refills | Status: DC | PRN

## 2021-05-08 MED ORDER — BUTALBITAL-APAP-CAFFEINE 50-325-40 MG PO TABS
1.0000 | ORAL_TABLET | Freq: Four times a day (QID) | ORAL | Status: DC | PRN
  Administered 2021-05-08: 1 via ORAL
  Filled 2021-05-08: qty 1

## 2021-05-08 MED ORDER — CEFDINIR 300 MG PO CAPS: 300.0000 mg | ORAL_CAPSULE | Freq: Two times a day (BID) | ORAL | 0 refills | Status: AC

## 2021-05-08 MED ORDER — ACETAMINOPHEN 325 MG PO TABS
650.0000 mg | ORAL_TABLET | Freq: Four times a day (QID) | ORAL | 0 refills | Status: AC | PRN
Start: 2021-05-08 — End: ?

## 2021-05-08 MED ORDER — AZITHROMYCIN 250 MG PO TABS: 250.0000 mg | ORAL_TABLET | Freq: Every day | ORAL | 0 refills | Status: AC

## 2021-05-08 MED ORDER — AZITHROMYCIN 250 MG PO TABS: 250.0000 mg | ORAL_TABLET | Freq: Every day | ORAL | Status: DC

## 2021-05-08 MED ORDER — SODIUM CHLORIDE 0.9 % IV SOLN
100.0000 mg | Freq: Two times a day (BID) | INTRAVENOUS | Status: DC
  Filled 2021-05-08: qty 100

## 2021-05-08 NOTE — Telephone Encounter (Signed)
[facility] FYI:  --Caller states girlfriend is having a lot pain and swelling in left side of body. Swelling, redness, and pain of left left and left foot.  Advised to go to ED now, patient understood

## 2021-05-08 NOTE — Telephone Encounter (Signed)
FYI--Pt currently admitted

## 2021-05-08 NOTE — Discharge Summary (Signed)
Physician Discharge Summary  Michelle [name] [id number] DOB: 1951-03-03 DOA: 05/06/2021  PCP: [name], MD  Admit date: 05/06/2021 Discharge date: 05/08/2021  Admitted From: Home Disposition: Home  Recommendations for Outpatient Follow-up:  1. Follow up with PCP in 1-2 weeks 2. Follow up with Cardiology as an outpatient  3. Repeat chest x-ray in 3 to 6 weeks 4. Please obtain CMP/CBC, Mag, Phos in one week 5. Please follow up on the following pending results:  Home Health: No Equipment/Devices: None  Discharge Condition: Stable  CODE STATUS: FULL CODE Diet recommendation: Regular Diet   Brief/Interim Summary: The patient is a 70 year old Caucasian thin female with a past medical history significant for but not limited to tetralogy of Fallot status postsurgical repair 1958, anxiety depression, hypertension, CKD stage IIIa, dyslipidemia, hypothyroidism as well as other comorbidities who presented to the ED with a complaints.  She is extremely poor historian and history was difficult to obtain and when she was asked why she came to the hospital she reportedly had but stated that she had problems with her equilibrium and falling.  She then told the admitting physician that she has troubles with her memory and has dementia and does not been taking Aricept given that it is an allergy.  Also complained of some dyspnea.  She is evaluated and a CT of the head showed no acute process but chest x-ray did show some mild pulmonary edema.  She is given a dose of IV Lasix and hospitalist was called to admit this patient.  Given her confusion she underwent an MRI which showed no acute infarct but had incomplete study.  Her repeat chest x-ray showed no evidence of pulmonary edema but did show a right lower lobe possible pneumonia.  She is started on empiric antibiotics for 5 days.  She felt better and was back to her baseline.  She is deemed stable to be discharged home and Follow-up with PCP and  cardiology in outpatient setting.  Discharge Diagnoses:  Active Problems:   Dyslipidemia   Tetralogy of Fallot s/p repair   Vertigo   Cachexia ([place])   Pulmonary edema  Right Lower Lobe PNA -POA -CXR this AM showed "Subtle opacity at the periphery of the RIGHT lung base is new from the preceding study. Potentially developing infection or inflammation. Consider short interval follow-up PA and lateral chest after resolution of acute symptoms. No edema or effusion." -She does have a cough and had a Leukocytosis and went from 13.8 -> 12.7 -Started Cefdinir and Azithro po x5 Days -Repeat CXR in 3-6 weeks   Pulmonary edema in the setting of acute diastolic CHF -Chest x-ray on admissiondemonstrates pulmonary edema -History of tetralogy of Fallotand tobacco abuse -Currently maintaining sats on room air -BNP elevated at 340.7 -She received one-time dose of IV Lasix in the ED and improved significantly -Albuterol as needed -We will order echocardiogram and this is as below and showed grade 1 diastolic dysfunction -Reassess for IV Lasix in the a.m. and she did not need any -Strict I's and O's and daily weights -PT OT evaluating and treating and recommended no follow-up and she is deemed stable for discharge 53 to follow-up with her PCP and cardiology in the outpatient setting  Vertigo -Patient describes what sounds like vertigo at times -CT the head showed no acute processes -PT consulted and recommending no follow-up -Fall precautions -We will check MRI and showed no acute infarct but was an incomplete study  Confusion -Check MRI as above and  showed no infarct -continue monitor and placed on delirium precautions -Improved significantly and she is back to her baseline  Hypothyroidism -TSH on admission 3.7 -Continue home Synthroid  Anxiety and depression -Continue home sertraline   CKD 3a -Currently stable.baseline creatinine appears around 1.1 and improved to  15/0.99 -Recommend avoiding further NSAIDs  Dyslipidemia -Continue home statin  Headache -Continue with acetaminophen and added Fioricet -Improved prior to discharge  Darier Syndrome -C/w Triamcinalone -Has lesions on her lower abdomen; follow-up with dermatology in outpatient setting  Leukocytosis in the setting of pneumonia as above -Unclear etiology -Continue monitor for signs and symptoms of infection; currently afebrile -Repeat CBC shows improvement and she has been started on empiric antibiotics with p.o. cefdinir and p.o. azithromycin for 5 days  Underweight/severe protein calorie malnutrition -We will consult nutritionist for further evaluation recommendations -Follow-up with PCP and nutritionist in the outpatient setting  Discharge Instructions  Discharge Instructions    Call MD for:  difficulty breathing, headache or visual disturbances   Complete by: As directed    Call MD for:  extreme fatigue   Complete by: As directed    Call MD for:  hives   Complete by: As directed    Call MD for:  persistant dizziness or light-headedness   Complete by: As directed    Call MD for:  persistant nausea and vomiting   Complete by: As directed    Call MD for:  redness, tenderness, or signs of infection (pain, swelling, redness, odor or green/yellow discharge around incision site)   Complete by: As directed    Call MD for:  severe uncontrolled pain   Complete by: As directed    Call MD for:  temperature >100.4   Complete by: As directed    Diet - low sodium heart healthy   Complete by: As directed    Discharge instructions   Complete by: As directed    You were cared for by a hospitalist during your hospital stay. If you have any questions about your discharge medications or the care you received while you were in the hospital after you are discharged, you can call the unit and ask to speak with the hospitalist on call if the hospitalist that took care of you is not  available. Once you are discharged, your primary care physician will handle any further medical issues. Please note that NO REFILLS for any discharge medications will be authorized once you are discharged, as it is imperative that you return to your primary care physician (or establish a relationship with a primary care physician if you do not have one) for your aftercare needs so that they can reassess your need for medications and monitor your lab values.  Follow up with PCP within 1 week. Take all medications as prescribed. If symptoms change or worsen please return to the ED for evaluation   Increase activity slowly   Complete by: As directed      Allergies as of 05/08/2021      Reactions   Aricept [donepezil] Other (See Comments)   bradycardia   Penicillins Rash   Has patient had a PCN reaction causing immediate rash, facial/tongue/throat swelling, SOB or lightheadedness with hypotension: Yes Has patient had a PCN reaction causing severe rash involving mucus membranes or skin necrosis: No Has patient had a PCN reaction that required hospitalization: No Has patient had a PCN reaction occurring within the last 10 years: No If all of the above answers are "NO", then may proceed with  Cephalosporin use.      Medication List    TAKE these medications   acetaminophen 325 MG tablet Commonly known as: TYLENOL Take 2 tablets (650 mg total) by mouth every 6 (six) hours as needed for mild pain (or Fever >/= 101).   albuterol 108 (90 Base) MCG/ACT inhaler Commonly known as: VENTOLIN HFA Inhale 2 puffs into the lungs every 6 (six) hours as needed for wheezing or shortness of breath.   amitriptyline 50 MG tablet Commonly known as: ELAVIL TAKE 1 TABLET BY MOUTH EVERYDAY AT BEDTIME What changed: See the new instructions.   azithromycin 250 MG tablet Commonly known as: ZITHROMAX Take 1 tablet (250 mg total) by mouth daily for 4 days.   cefdinir 300 MG capsule Commonly known as:  OMNICEF Take 1 capsule (300 mg total) by mouth every 12 (twelve) hours for 10 doses.   dicyclomine 10 MG capsule Commonly known as: BENTYL Take 10 mg by mouth 3 (three) times daily as needed for spasms.   levothyroxine 50 MCG tablet Commonly known as: SYNTHROID Take 1 tablet (50 mcg total) by mouth daily.   mirtazapine 7.5 MG tablet Commonly known as: REMERON Take 1 tablet (7.5 mg total) by mouth at bedtime.   multivitamin tablet Take 1 tablet by mouth daily.   ondansetron 4 MG tablet Commonly known as: ZOFRAN Take 1 tablet (4 mg total) by mouth every 6 (six) hours as needed for nausea.   sertraline 100 MG tablet Commonly known as: ZOLOFT Take 1 tablet (100 mg total) by mouth daily.   simvastatin 10 MG tablet Commonly known as: ZOCOR Take 1 tablet (10 mg total) by mouth daily.   triamcinolone cream 0.1 % Commonly known as: KENALOG Apply 1 application topically 2 (two) times daily as needed for rash.       Allergies  Allergen Reactions  . Aricept [Donepezil] Other (See Comments)    bradycardia  . Penicillins Rash    Has patient had a PCN reaction causing immediate rash, facial/tongue/throat swelling, SOB or lightheadedness with hypotension: Yes Has patient had a PCN reaction causing severe rash involving mucus membranes or skin necrosis: No Has patient had a PCN reaction that required hospitalization: No Has patient had a PCN reaction occurring within the last 10 years: No If all of the above answers are "NO", then may proceed with Cephalosporin use.    Consultations:  None  Procedures/Studies: CT Head Wo Contrast  Result Date: 05/07/2021 CLINICAL DATA:  Facial trauma. EXAM: CT HEAD WITHOUT CONTRAST TECHNIQUE: Contiguous axial images were obtained from the base of the skull through the vertex without intravenous contrast. COMPARISON:  CT head 04/06/2021 FINDINGS: Brain: Cerebral ventricle sizes are concordant with the degree of cerebral volume loss. Patchy and  confluent areas of decreased attenuation are noted throughout the deep and periventricular white matter of the cerebral hemispheres bilaterally, compatible with chronic microvascular ischemic disease. No evidence of large-territorial acute infarction. No parenchymal hemorrhage. No mass lesion. No extra-axial collection. No mass effect or midline shift. No hydrocephalus. Basilar cisterns are patent. Vascular: No hyperdense vessel. Atherosclerotic calcifications are present within the cavernous internal carotid arteries. Skull: No acute fracture. Similar-appearing nonspecific densely sclerotic lesion within the right frontal bone (3:53). Sinuses/Orbits: Frontal and ethmoid sinus mucosal thickening. Right maxillary sinus mucosal thickening. Otherwise remaining visualized paranasal sinuses and mastoid air cells are clear. The orbits are unremarkable. Other: None. IMPRESSION: No acute intracranial abnormality. Electronically Signed   By: Iven Finn M.D.   On: 05/07/2021 04:27  MR BRAIN WO CONTRAST  Result Date: 05/07/2021 CLINICAL DATA:  Mental status change EXAM: MRI HEAD WITHOUT CONTRAST TECHNIQUE: Multiplanar, multiecho pulse sequences of the brain and surrounding structures were obtained without intravenous contrast. COMPARISON:  MRI head 08/22/2017 FINDINGS: Brain: Incomplete study. Patient not able to complete the study. Diffusion-weighted imaging is diagnostic. Motion degraded T2 imaging. Negative for acute infarct. Generalized atrophy with ventricular enlargement similar to the prior study. No mass or fluid collection identified. IMPRESSION: Incomplete study.  No acute infarct identified. Electronically Signed   By: Franchot Gallo M.D.   On: 05/07/2021 17:24   DG CHEST PORT 1 VIEW  Result Date: 05/08/2021 CLINICAL DATA:  History of pulmonary edema. EXAM: PORTABLE CHEST 1 VIEW COMPARISON:  May 07, 2021. FINDINGS: Lungs remain hyperinflated. Signs of tricuspid and pulmonic valve replacement as before.  Cardiomediastinal contours are stable. Engorgement of central pulmonary vasculature is similar to the prior study. Subtle opacity at the periphery of the RIGHT lung base is new from the preceding study. No effusion or consolidation. On limited assessment no acute skeletal process. EKG leads sticker projects over the LEFT upper chest. IMPRESSION: Subtle opacity at the periphery of the RIGHT lung base is new from the preceding study. Potentially developing infection or inflammation. Consider short interval follow-up PA and lateral chest after resolution of acute symptoms. No edema or effusion. Electronically Signed   By: Zetta Bills M.D.   On: 05/08/2021 08:35   DG Chest Portable 1 View  Result Date: 05/07/2021 CLINICAL DATA:  Intermittent headache. Weakness, dizziness, generalized body EXAM: PORTABLE CHEST 1 VIEW COMPARISON:  Chest x-ray 04/06/2021, CT chest 08/23/2019 FINDINGS: The heart size and mediastinal contours are within normal limits. Replace tricuspid and mitral valve. Aortic calcification. Right apex not well visualized due to overlying mandible. Similar-appearing hyperinflation of the lungs. Bilateral pericentimeter round densities overlying the peripheral lower lobes consistent with nipple shadows. Biapical pleural/pulmonary scarring. No focal consolidation. Slightly increased interstitial markings. Blunting of costophrenic angles with possible trace pleural effusions bilaterally. No pneumothorax. No acute osseous abnormality. IMPRESSION: 1. Mild pulmonary edema. Interval development of blunting of bilateral costophrenic angles which could represent trace pleural effusions. 2. Aortic Atherosclerosis (ICD10-I70.0) and Emphysema (ICD10-J43.9). 3. If clinically appropriate, please evaluate patient on age and smoking history to determine if patient meets the inclusion criteria for specialized low-dose lung cancer screening chest CT. Electronically Signed   By: Iven Finn M.D.   On: 05/07/2021  03:41   ECHOCARDIOGRAM COMPLETE  Result Date: 05/07/2021    ECHOCARDIOGRAM REPORT   Patient Name:   Michelle [name] Date of Exam: 05/07/2021 Medical Rec #:  [id number]        Height:       64.0 in Accession #:    [id number]       Weight:       89.1 lb Date of Birth:  1951-07-02         BSA:          1.387 m Patient Age:    60 years         BP:           91/79 mmHg Patient Gender: F                HR:           59 bpm. Exam Location:  Inpatient Procedure: 2D Echo, Cardiac Doppler and Color Doppler Indications:    Pulmonic valve disease I37.9  Dyspnea R06.00  History:        Patient has prior history of Echocardiogram examinations, most                 recent 09/30/2020. Risk Factors:Hypertension, Dyslipidemia and                 Former Smoker. History of Tetrology of Fallot s/p surgical                 repair in 1958.                 Pulmonic Valve Replacement 73mm Trifect valve 01/24/2017                 Tricuspid Annuloplasty 41mm ring placement 01/24/2017.  Sonographer:    Darlina Sicilian RDCS Referring Phys: [id number] Orlean Bradford MACNEIL  Sonographer Comments: Technically challenging study due to limited acoustic windows, suboptimal parasternal window and suboptimal apical window. Echo performed with patient supine per patients request. IMPRESSIONS  1. History of tetralogy of Fallot with prior repair. History of bioprosthetic pulmonary valve 31mm Trifecta, tricuspid valve annuloplasty, ASD repair. . Left ventricular ejection fraction, by estimation, is 65 to 70%. The left ventricle has normal function. The left ventricle has no regional wall motion abnormalities. Left ventricular diastolic parameters are consistent with Grade I diastolic dysfunction (impaired relaxation).  2. Right ventricular systolic function is normal. The right ventricular size is normal. There is normal pulmonary artery systolic pressure.  3. The mitral valve is normal in structure. No evidence of mitral valve regurgitation. No  evidence of mitral stenosis.  4. History of TV anuloplasty/ring. . The tricuspid valve is has been repaired/replaced.  5. The aortic valve was not well visualized. Aortic valve regurgitation is not visualized. No aortic stenosis is present.  6. History of bioprosthetic pulmonary valve 12mm Trifecta. Poorly visualized. No significant regurgitation or stenosis.  7. No significant pulmonary HTN, PASP is 28 mmHg.  8. The inferior vena cava is normal in size with greater than 50% respiratory variability, suggesting right atrial pressure of 3 mmHg. FINDINGS  Left Ventricle: History of tetralogy of Fallot with prior repair. History of bioprosthetic pulmonary valve 53mm Trifecta, tricuspid valve annuloplasty, ASD repair. Left ventricular ejection fraction, by estimation, is 65 to 70%. The left ventricle has normal function. The left ventricle has no regional wall motion abnormalities. The left ventricular internal cavity size was normal in size. There is no left ventricular hypertrophy. Left ventricular diastolic parameters are consistent with Grade I diastolic dysfunction (impaired relaxation). Normal left ventricular filling pressure. Right Ventricle: The right ventricular size is normal. Right vetricular wall thickness was not well visualized. Right ventricular systolic function is normal. There is normal pulmonary artery systolic pressure. The tricuspid regurgitant velocity is 2.39 m/s, and with an assumed right atrial pressure of 3 mmHg, the estimated right ventricular systolic pressure is 0000000 mmHg. Left Atrium: Left atrial size was normal in size. Right Atrium: Right atrial size was normal in size. Pericardium: There is no evidence of pericardial effusion. Mitral Valve: The mitral valve is normal in structure. No evidence of mitral valve regurgitation. No evidence of mitral valve stenosis. Tricuspid Valve: History of TV anuloplasty/ring. The tricuspid valve is has been repaired/replaced. Tricuspid valve regurgitation  is mild . No evidence of tricuspid stenosis. Aortic Valve: The aortic valve was not well visualized. Aortic valve regurgitation is not visualized. No aortic stenosis is present. Pulmonic Valve: History of bioprosthetic pulmonary valve 61mm Trifecta. Poorly visualized. No significant  regurgitation or stenosis. The pulmonic valve was not well visualized. Pulmonic valve regurgitation is not visualized. No evidence of pulmonic stenosis. Aorta: The aortic root was not well visualized. Pulmonary Artery: No significant pulmonary HTN, PASP is 28 mmHg. Venous: The inferior vena cava is normal in size with greater than 50% respiratory variability, suggesting right atrial pressure of 3 mmHg. IAS/Shunts: No atrial level shunt detected by color flow Doppler.  LEFT VENTRICLE PLAX 2D LVIDd:         4.20 cm  Diastology LVIDs:         2.50 cm  LV e' medial:    4.24 cm/s LV PW:         1.00 cm  LV E/e' medial:  9.0 LV IVS:        0.70 cm  LV e' lateral:   7.72 cm/s LVOT diam:     1.90 cm  LV E/e' lateral: 4.9 LV SV:         52 LV SV Index:   37 LVOT Area:     2.84 cm  RIGHT VENTRICLE RV S prime:     8.70 cm/s RVOT diam:      1.31 cm TAPSE (M-mode): 1.1 cm LEFT ATRIUM             Index LA diam:        3.30 cm 2.38 cm/m LA Vol (A2C):   26.0 ml 18.74 ml/m LA Vol (A4C):   24.9 ml 17.95 ml/m LA Biplane Vol: 27.1 ml 19.54 ml/m  AORTIC VALVE             PULMONIC VALVE LVOT Vmax:   94.60 cm/s  PV Vmax:       0.89 m/s LVOT Vmean:  60.600 cm/s PV VTI:        0.149 m LVOT VTI:    0.183 m     PV Peak grad:  3.1 mmHg                          PV Mean grad:  1.2 mmHg  MITRAL VALVE               TRICUSPID VALVE MV Area (PHT): 1.85 cm    TV Peak grad:   1.5 mmHg MV Decel Time: 411 msec    TV Vmax:        0.61 m/s MV E velocity: 38.10 cm/s  TR Peak grad:   22.8 mmHg MV A velocity: 51.40 cm/s  TR Vmax:        239.00 cm/s MV E/A ratio:  0.74                            SHUNTS                            Systemic VTI:  0.18 m                             Systemic Diam: 1.90 cm                            Pulmonic Diam: 1.31 cm Carlyle Dolly MD Electronically signed by Carlyle Dolly MD Signature Date/Time: 05/07/2021/1:13:34 PM    Final     ECHOCARDIOGRAM IMPRESSIONS    1. History of  tetralogy of Fallot with prior repair. History of  bioprosthetic pulmonary valve 75mm Trifecta, tricuspid valve annuloplasty,  ASD repair. . Left ventricular ejection fraction, by estimation, is 65 to  70%. The left ventricle has normal  function. The left ventricle has no regional wall motion abnormalities.  Left ventricular diastolic parameters are consistent with Grade I  diastolic dysfunction (impaired relaxation).  2. Right ventricular systolic function is normal. The right ventricular  size is normal. There is normal pulmonary artery systolic pressure.  3. The mitral valve is normal in structure. No evidence of mitral valve  regurgitation. No evidence of mitral stenosis.  4. History of TV anuloplasty/ring. . The tricuspid valve is has been  repaired/replaced.  5. The aortic valve was not well visualized. Aortic valve regurgitation  is not visualized. No aortic stenosis is present.  6. History of bioprosthetic pulmonary valve 19mm Trifecta. Poorly  visualized. No significant regurgitation or stenosis.  7. No significant pulmonary HTN, PASP is 28 mmHg.  8. The inferior vena cava is normal in size with greater than 50%  respiratory variability, suggesting right atrial pressure of 3 mmHg.   FINDINGS  Left Ventricle: History of tetralogy of Fallot with prior repair. History  of bioprosthetic pulmonary valve 61mm Trifecta, tricuspid valve  annuloplasty, ASD repair. Left ventricular ejection fraction, by  estimation, is 65 to 70%. The left ventricle has  normal function. The left ventricle has no regional wall motion  abnormalities. The left ventricular internal cavity size was normal in  size. There is no left ventricular hypertrophy. Left  ventricular diastolic  parameters are consistent with Grade I  diastolic dysfunction (impaired relaxation). Normal left ventricular  filling pressure.   Right Ventricle: The right ventricular size is normal. Right vetricular  wall thickness was not well visualized. Right ventricular systolic  function is normal. There is normal pulmonary artery systolic pressure.  The tricuspid regurgitant velocity is 2.39  m/s, and with an assumed right atrial pressure of 3 mmHg, the estimated  right ventricular systolic pressure is 0000000 mmHg.   Left Atrium: Left atrial size was normal in size.   Right Atrium: Right atrial size was normal in size.   Pericardium: There is no evidence of pericardial effusion.   Mitral Valve: The mitral valve is normal in structure. No evidence of  mitral valve regurgitation. No evidence of mitral valve stenosis.   Tricuspid Valve: History of TV anuloplasty/ring. The tricuspid valve is  has been repaired/replaced. Tricuspid valve regurgitation is mild . No  evidence of tricuspid stenosis.   Aortic Valve: The aortic valve was not well visualized. Aortic valve  regurgitation is not visualized. No aortic stenosis is present.   Pulmonic Valve: History of bioprosthetic pulmonary valve 51mm Trifecta.  Poorly visualized. No significant regurgitation or stenosis. The pulmonic  valve was not well visualized. Pulmonic valve regurgitation is not  visualized. No evidence of pulmonic  stenosis.   Aorta: The aortic root was not well visualized.   Pulmonary Artery: No significant pulmonary HTN, PASP is 28 mmHg.   Venous: The inferior vena cava is normal in size with greater than 50%  respiratory variability, suggesting right atrial pressure of 3 mmHg.   IAS/Shunts: No atrial level shunt detected by color flow Doppler.     LEFT VENTRICLE  PLAX 2D  LVIDd:     4.20 cm Diastology  LVIDs:     2.50 cm LV e' medial:  4.24 cm/s  LV PW:     1.00 cm LV E/e'  medial: 9.0  LV IVS:    0.70 cm LV e' lateral:  7.72 cm/s  LVOT diam:   1.90 cm LV E/e' lateral: 4.9  LV SV:     52  LV SV Index:  37  LVOT Area:   2.84 cm     RIGHT VENTRICLE  RV S prime:   8.70 cm/s  RVOT diam:   1.31 cm  TAPSE (M-mode): 1.1 cm   LEFT ATRIUM       Index  LA diam:    3.30 cm 2.38 cm/m  LA Vol (A2C):  26.0 ml 18.74 ml/m  LA Vol (A4C):  24.9 ml 17.95 ml/m  LA Biplane Vol: 27.1 ml 19.54 ml/m  AORTIC VALVE       PULMONIC VALVE  LVOT Vmax:  94.60 cm/s PV Vmax:    0.89 m/s  LVOT Vmean: 60.600 cm/s PV VTI:    0.149 m  LVOT VTI:  0.183 m   PV Peak grad: 3.1 mmHg              PV Mean grad: 1.2 mmHg     MITRAL VALVE        TRICUSPID VALVE  MV Area (PHT): 1.85 cm  TV Peak grad:  1.5 mmHg  MV Decel Time: 411 msec  TV Vmax:    0.61 m/s  MV E velocity: 38.10 cm/s TR Peak grad:  22.8 mmHg  MV A velocity: 51.40 cm/s TR Vmax:    239.00 cm/s  MV E/A ratio: 0.74               SHUNTS               Systemic VTI: 0.18 m               Systemic Diam: 1.90 cm               Pulmonic Diam: 1.31 cm   Subjective: Seen and examined at bedside and she was doing much better today.  Denies any lightheadedness or dizziness.  Felt back to her baseline and states that she was little confused yesterday but is alert and oriented now   Discharge Exam: Vitals:   05/08/21 0400 05/08/21 0752  BP: 113/67 (!) 101/57  Pulse: 68 67  Resp: 18 14  Temp: 98 F (36.7 C) 98.5 F (36.9 C)  SpO2: 95% 96%   Vitals:   05/07/21 1630 05/07/21 1745 05/08/21 0400 05/08/21 0752  BP: 127/75 (!) 115/59 113/67 (!) 101/57  Pulse: 62 (!) 56 68 67  Resp: (!) 25 16 18 14   Temp:  97.7 F (36.5 C) 98 F (36.7 C) 98.5 F (36.9 C)  TempSrc:  Oral Oral Oral  SpO2: 95% 96% 95% 96%  Weight:    39.7 kg  Height:       General: Pt is an extremely thin  Caucasian female who is alert, awake, not in acute distress Cardiovascular: RRR, S1/S2 +, no rubs, no gallops Respiratory: Slightly diminished bilaterally, no wheezing, no rhonchi Abdominal: Soft, NT, ND, bowel sounds +; has a rash in her abdomen that is consistent with her Darier Extremities: no edema, no cyanosis  The results of significant diagnostics from this hospitalization (including imaging, microbiology, ancillary and laboratory) are listed below for reference.    Microbiology: Recent Results (from the past 240 hour(s))  Resp Panel by RT-PCR (Flu A&B, Covid) Nasopharyngeal Swab     Status: None   Collection Time: 05/06/21  8:48 PM   Specimen:  Nasopharyngeal Swab; Nasopharyngeal(NP) swabs in vial transport medium  Result Value Ref Range Status   SARS Coronavirus 2 by RT PCR NEGATIVE NEGATIVE Final    Comment: (NOTE) SARS-CoV-2 target nucleic acids are NOT DETECTED.  The SARS-CoV-2 RNA is generally detectable in upper respiratory specimens during the acute phase of infection. The lowest concentration of SARS-CoV-2 viral copies this assay can detect is 138 copies/mL. A negative result does not preclude SARS-Cov-2 infection and should not be used as the sole basis for treatment or other patient management decisions. A negative result may occur with  improper specimen collection/handling, submission of specimen other than nasopharyngeal swab, presence of viral mutation(s) within the areas targeted by this assay, and inadequate number of viral copies(<138 copies/mL). A negative result must be combined with clinical observations, patient history, and epidemiological information. The expected result is Negative.  Fact Sheet for Patients:  EntrepreneurPulse.com.au  Fact Sheet for Healthcare Providers:  IncredibleEmployment.be  This test is no t yet approved or cleared by the [place] FDA and  has been authorized for detection and/or  diagnosis of SARS-CoV-2 by FDA under an Emergency Use Authorization (EUA). This EUA will remain  in effect (meaning this test can be used) for the duration of the COVID-19 declaration under Section 564(b)(1) of the Act, 21 U.S.C.section 360bbb-3(b)(1), unless the authorization is terminated  or revoked sooner.       Influenza A by PCR NEGATIVE NEGATIVE Final   Influenza B by PCR NEGATIVE NEGATIVE Final    Comment: (NOTE) The Xpert Xpress SARS-CoV-2/FLU/RSV plus assay is intended as an aid in the diagnosis of influenza from Nasopharyngeal swab specimens and should not be used as a sole basis for treatment. Nasal washings and aspirates are unacceptable for Xpert Xpress SARS-CoV-2/FLU/RSV testing.  Fact Sheet for Patients: EntrepreneurPulse.com.au  Fact Sheet for Healthcare Providers: IncredibleEmployment.be  This test is not yet approved or cleared by the [place] FDA and has been authorized for detection and/or diagnosis of SARS-CoV-2 by FDA under an Emergency Use Authorization (EUA). This EUA will remain in effect (meaning this test can be used) for the duration of the COVID-19 declaration under Section 564(b)(1) of the Act, 21 U.S.C. section 360bbb-3(b)(1), unless the authorization is terminated or revoked.  Performed at [facility], [place] [address], [place], [place] [address]     Labs: BNP (last 3 results) Recent Labs    09/25/20 0500 05/07/21 0527  BNP 716.1* [id number]*   Basic Metabolic Panel: Recent Labs  Lab 05/06/21 2040 05/07/21 0612 05/08/21 0347  NA 138  --  140  K 4.4  --  4.3  CL 102  --  103  CO2 29  --  27  GLUCOSE 86  --  69*  BUN 19  --  15  CREATININE 1.07*  --  0.99  CALCIUM 8.8*  --  8.7*  MG  --  2.0 1.9  PHOS  --  4.0 5.1*   Liver Function Tests: Recent Labs  Lab 05/06/21 2040 05/08/21 0347  AST 23 16  ALT 20 16  ALKPHOS 107 82  BILITOT 0.2* 0.6  PROT 6.6 6.4*  ALBUMIN 3.1* 2.7*    No results for input(s): LIPASE, AMYLASE in the last 168 hours. No results for input(s): AMMONIA in the last 168 hours. CBC: Recent Labs  Lab 05/06/21 2040 05/08/21 0347  WBC 13.8* 12.7*  NEUTROABS 11.0*  --   HGB 12.8 12.6  HCT 40.5 39.8  MCV 92.7 91.1  PLT 401*  377   Cardiac Enzymes: No results for input(s): CKTOTAL, CKMB, CKMBINDEX, TROPONINI in the last 168 hours. BNP: Invalid input(s): POCBNP CBG: No results for input(s): GLUCAP in the last 168 hours. D-Dimer No results for input(s): DDIMER in the last 72 hours. Hgb A1c No results for input(s): HGBA1C in the last 72 hours. Lipid Profile No results for input(s): CHOL, HDL, LDLCALC, TRIG, CHOLHDL, LDLDIRECT in the last 72 hours. Thyroid function studies Recent Labs    05/06/21 2040  TSH 3.716   Anemia work up No results for input(s): VITAMINB12, FOLATE, FERRITIN, TIBC, IRON, RETICCTPCT in the last 72 hours. Urinalysis    Component Value Date/Time   COLORURINE YELLOW 03/28/2021 1400   APPEARANCEUR CLEAR 03/28/2021 1400   LABSPEC 1.010 03/28/2021 1400   PHURINE 6.5 03/28/2021 1400   GLUCOSEU NEGATIVE 03/28/2021 1400   HGBUR TRACE-INTACT (A) 03/28/2021 1400   BILIRUBINUR NEGATIVE 03/28/2021 1400   BILIRUBINUR NEG 12/24/2012 1458   KETONESUR NEGATIVE 03/28/2021 1400   PROTEINUR NEGATIVE 09/30/2020 0232   UROBILINOGEN 0.2 03/28/2021 1400   NITRITE NEGATIVE 03/28/2021 1400   LEUKOCYTESUR SMALL (A) 03/28/2021 1400   Sepsis Labs Invalid input(s): PROCALCITONIN,  WBC,  LACTICIDVEN Microbiology Recent Results (from the past 240 hour(s))  Resp Panel by RT-PCR (Flu A&B, Covid) Nasopharyngeal Swab     Status: None   Collection Time: 05/06/21  8:48 PM   Specimen: Nasopharyngeal Swab; Nasopharyngeal(NP) swabs in vial transport medium  Result Value Ref Range Status   SARS Coronavirus 2 by RT PCR NEGATIVE NEGATIVE Final    Comment: (NOTE) SARS-CoV-2 target nucleic acids are NOT DETECTED.  The SARS-CoV-2 RNA is  generally detectable in upper respiratory specimens during the acute phase of infection. The lowest concentration of SARS-CoV-2 viral copies this assay can detect is 138 copies/mL. A negative result does not preclude SARS-Cov-2 infection and should not be used as the sole basis for treatment or other patient management decisions. A negative result may occur with  improper specimen collection/handling, submission of specimen other than nasopharyngeal swab, presence of viral mutation(s) within the areas targeted by this assay, and inadequate number of viral copies(<138 copies/mL). A negative result must be combined with clinical observations, patient history, and epidemiological information. The expected result is Negative.  Fact Sheet for Patients:  EntrepreneurPulse.com.au  Fact Sheet for Healthcare Providers:  IncredibleEmployment.be  This test is no t yet approved or cleared by the [place] FDA and  has been authorized for detection and/or diagnosis of SARS-CoV-2 by FDA under an Emergency Use Authorization (EUA). This EUA will remain  in effect (meaning this test can be used) for the duration of the COVID-19 declaration under Section 564(b)(1) of the Act, 21 U.S.C.section 360bbb-3(b)(1), unless the authorization is terminated  or revoked sooner.       Influenza A by PCR NEGATIVE NEGATIVE Final   Influenza B by PCR NEGATIVE NEGATIVE Final    Comment: (NOTE) The Xpert Xpress SARS-CoV-2/FLU/RSV plus assay is intended as an aid in the diagnosis of influenza from Nasopharyngeal swab specimens and should not be used as a sole basis for treatment. Nasal washings and aspirates are unacceptable for Xpert Xpress SARS-CoV-2/FLU/RSV testing.  Fact Sheet for Patients: EntrepreneurPulse.com.au  Fact Sheet for Healthcare Providers: IncredibleEmployment.be  This test is not yet approved or cleared by the [place] FDA and has been authorized for detection and/or diagnosis of SARS-CoV-2 by FDA under an Emergency Use Authorization (EUA). This EUA will remain in effect (meaning this test can be used)  for the duration of the COVID-19 declaration under Section 564(b)(1) of the Act, 21 U.S.C. section 360bbb-3(b)(1), unless the authorization is terminated or revoked.  Performed at [facility], [place] [address], [place], [place] [address]    Time coordinating discharge: 35 minutes  SIGNED:  Kerney Elbe, DO [facility] 05/08/2021, 6:04 PM Pager is on [place]  If 7PM-7AM, please contact night-coverage [email or web address]
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
## 2021-05-08 NOTE — Progress Notes (Signed)
Spoke with patient's significant other regarding discharge. Assistance was offered for transportation. He states that transportation was not the issue. He was needing more time to "get ready" for her to come home. Meaning he was trying to make arrangements to have someone home with her while he works. We expressed understand and the demand for inpatient beds. He verbalized understanding and has agreed to be here within the next 2 hours to pick her up. The department number was given so he can call up and let staff know he is downstairs for pick up.

## 2021-05-08 NOTE — Telephone Encounter (Signed)
   Mr Arvella Nigh calling to report patient hospitalized, requesting assistance with getting home health after discharge

## 2021-05-08 NOTE — Telephone Encounter (Signed)
[name] has been informed that pt would need a hospital f/u OV to discuss with PCP. He expressed understanding and would call back to schedule once she has been discharged.
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
## 2021-05-08 NOTE — Progress Notes (Signed)
Communication provided to Speech Therapist of MD request for eval d/t imminent discharge.

## 2021-05-08 NOTE — Care Management Obs Status (Signed)
Jack NOTIFICATION   Patient Details  Name: Michelle [name] MRN: [id number] Date of Birth: 1951/07/22   Medicare Observation Status Notification Given:  Yes    Joanne Chars, [place] 05/08/2021, 2:55 PM
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
## 2021-05-18 ENCOUNTER — Emergency Department (HOSPITAL_COMMUNITY)
Admission: EM | Admit: 2021-05-18 | Discharge: 2021-05-18 | Disposition: A | Discharge status: Home / Self Care | Payer: Medicare Other | Attending: Emergency Medicine | Admitting: Emergency Medicine

## 2021-05-18 ENCOUNTER — Telehealth: Payer: Self-pay | Admitting: Pharmacist

## 2021-05-18 ENCOUNTER — Encounter (HOSPITAL_COMMUNITY): Payer: Self-pay

## 2021-05-18 ENCOUNTER — Emergency Department (HOSPITAL_COMMUNITY): Payer: Medicare Other

## 2021-05-18 ENCOUNTER — Other Ambulatory Visit: Payer: Self-pay

## 2021-05-18 DIAGNOSIS — I129 Hypertensive chronic kidney disease with stage 1 through stage 4 chronic kidney disease, or unspecified chronic kidney disease: Secondary | ICD-10-CM | POA: Insufficient documentation

## 2021-05-18 DIAGNOSIS — Z87891 Personal history of nicotine dependence: Secondary | ICD-10-CM | POA: Insufficient documentation

## 2021-05-18 DIAGNOSIS — R001 Bradycardia, unspecified: Secondary | ICD-10-CM | POA: Diagnosis not present

## 2021-05-18 DIAGNOSIS — I4510 Unspecified right bundle-branch block: Secondary | ICD-10-CM | POA: Diagnosis not present

## 2021-05-18 DIAGNOSIS — Z743 Need for continuous supervision: Secondary | ICD-10-CM | POA: Diagnosis not present

## 2021-05-18 DIAGNOSIS — I959 Hypotension, unspecified: Secondary | ICD-10-CM | POA: Diagnosis not present

## 2021-05-18 DIAGNOSIS — Z20822 Contact with and (suspected) exposure to covid-19: Secondary | ICD-10-CM | POA: Diagnosis not present

## 2021-05-18 DIAGNOSIS — R531 Weakness: Secondary | ICD-10-CM | POA: Insufficient documentation

## 2021-05-18 DIAGNOSIS — Z79899 Other long term (current) drug therapy: Secondary | ICD-10-CM | POA: Insufficient documentation

## 2021-05-18 DIAGNOSIS — W19XXXA Unspecified fall, initial encounter: Secondary | ICD-10-CM | POA: Diagnosis not present

## 2021-05-18 DIAGNOSIS — N1832 Chronic kidney disease, stage 3b: Secondary | ICD-10-CM | POA: Insufficient documentation

## 2021-05-18 DIAGNOSIS — R079 Chest pain, unspecified: Secondary | ICD-10-CM | POA: Diagnosis not present

## 2021-05-18 LAB — CBC WITH DIFFERENTIAL/PLATELET
Abs Immature Granulocytes: 0.04 10*3/uL (ref 0.00–0.07)
Basophils Absolute: 0.1 10*3/uL (ref 0.0–0.1)
Basophils Relative: 0 %
Eosinophils Absolute: 0 10*3/uL (ref 0.0–0.5)
Eosinophils Relative: 0 %
HCT: 36.7 % (ref 36.0–46.0)
Hemoglobin: 11.6 g/dL — ABNORMAL LOW (ref 12.0–15.0)
Immature Granulocytes: 0 %
Lymphocytes Relative: 6 %
Lymphs Abs: 0.9 10*3/uL (ref 0.7–4.0)
MCH: 29.2 pg (ref 26.0–34.0)
MCHC: 31.6 g/dL (ref 30.0–36.0)
MCV: 92.4 fL (ref 80.0–100.0)
Monocytes Absolute: 0.5 10*3/uL (ref 0.1–1.0)
Monocytes Relative: 3 %
Neutro Abs: 12.7 10*3/uL — ABNORMAL HIGH (ref 1.7–7.7)
Neutrophils Relative %: 91 %
Platelets: 314 10*3/uL (ref 150–400)
RBC: 3.97 MIL/uL (ref 3.87–5.11)
RDW: 13.8 % (ref 11.5–15.5)
WBC: 14.2 10*3/uL — ABNORMAL HIGH (ref 4.0–10.5)
nRBC: 0 % (ref 0.0–0.2)

## 2021-05-18 LAB — COMPREHENSIVE METABOLIC PANEL
ALT: 14 U/L (ref 0–44)
AST: 28 U/L (ref 15–41)
Albumin: 3 g/dL — ABNORMAL LOW (ref 3.5–5.0)
Alkaline Phosphatase: 57 U/L (ref 38–126)
Anion gap: 5 (ref 5–15)
BUN: 14 mg/dL (ref 8–23)
CO2: 27 mmol/L (ref 22–32)
Calcium: 8.4 mg/dL — ABNORMAL LOW (ref 8.9–10.3)
Chloride: 110 mmol/L (ref 98–111)
Creatinine, Ser: 1.11 mg/dL — ABNORMAL HIGH (ref 0.44–1.00)
GFR, Estimated: 53 mL/min — ABNORMAL LOW (ref >60)
Glucose, Bld: 85 mg/dL (ref 70–99)
Potassium: 4.1 mmol/L (ref 3.5–5.1)
Sodium: 142 mmol/L (ref 135–145)
Total Bilirubin: 0.6 mg/dL (ref 0.3–1.2)
Total Protein: 5.9 g/dL — ABNORMAL LOW (ref 6.5–8.1)

## 2021-05-18 LAB — URINALYSIS, ROUTINE W REFLEX MICROSCOPIC
Bilirubin Urine: NEGATIVE
Glucose, UA: NEGATIVE mg/dL
Hgb urine dipstick: NEGATIVE
Ketones, ur: NEGATIVE mg/dL
Leukocytes,Ua: NEGATIVE
Nitrite: NEGATIVE
Protein, ur: NEGATIVE mg/dL
Specific Gravity, Urine: 1.011 (ref 1.005–1.030)
pH: 7 (ref 5.0–8.0)

## 2021-05-18 LAB — ETHANOL: Alcohol, Ethyl (B): <10 mg/dL (ref <10)

## 2021-05-18 LAB — RAPID URINE DRUG SCREEN, HOSP PERFORMED
Amphetamines: NOT DETECTED
Barbiturates: NOT DETECTED
Benzodiazepines: NOT DETECTED
Cocaine: NOT DETECTED
Opiates: NOT DETECTED
Tetrahydrocannabinol: NOT DETECTED

## 2021-05-18 LAB — RESP PANEL BY RT-PCR (FLU A&B, COVID) ARPGX2
Influenza A by PCR: NEGATIVE
Influenza B by PCR: NEGATIVE
SARS Coronavirus 2 by RT PCR: NEGATIVE

## 2021-05-18 NOTE — ED Notes (Signed)
Called pt's friend, Jenny Reichmann, and updated him that pt is being discharged, states he will be here shortly to pick pt up.Gave pt paper scrub pants, coffee, went over discharge papers, advised pt to keep upcoming appt w PCP and eat healthy food. Pt waiting in [place] for ride

## 2021-05-18 NOTE — Discharge Instructions (Signed)
You were evaluated in the Emergency Department and after careful evaluation, we did not find any emergent condition requiring admission or further testing in the hospital.  Please make sure to eat nutritious foods, you may buy over-the-counter Ensure.  Please return to the Emergency Department if you experience any worsening of your condition.  It is very important that you follow-up with your primary care doctor.  Thank you for allowing [place] to be a part of your care.

## 2021-05-18 NOTE — Progress Notes (Addendum)
[facility] Management Pharmacy Assistant   Name: CINTYA DAUGHETY  MRN: [id number] DOB: May 30, 1951  Reason for Encounter: Medication Review  Medications: Outpatient Encounter Medications as of 05/18/2021  Medication Sig Note   acetaminophen (TYLENOL) 325 MG tablet Take 2 tablets (650 mg total) by mouth every 6 (six) hours as needed for mild pain (or Fever >/= 101).    albuterol (VENTOLIN HFA) 108 (90 Base) MCG/ACT inhaler Inhale 2 puffs into the lungs every 6 (six) hours as needed for wheezing or shortness of breath.    amitriptyline (ELAVIL) 50 MG tablet TAKE 1 TABLET BY MOUTH EVERYDAY AT BEDTIME (Patient taking differently: Take 50 mg by mouth at bedtime as needed for sleep.)    dicyclomine (BENTYL) 10 MG capsule Take 10 mg by mouth 3 (three) times daily as needed for spasms.    levothyroxine (SYNTHROID) 50 MCG tablet Take 1 tablet (50 mcg total) by mouth daily.    mirtazapine (REMERON) 7.5 MG tablet Take 1 tablet (7.5 mg total) by mouth at bedtime. 05/07/2021: Pt has not started this medication   Multiple Vitamin (MULTIVITAMIN) tablet Take 1 tablet by mouth daily.    ondansetron (ZOFRAN) 4 MG tablet Take 1 tablet (4 mg total) by mouth every 6 (six) hours as needed for nausea.    sertraline (ZOLOFT) 100 MG tablet Take 1 tablet (100 mg total) by mouth daily.    simvastatin (ZOCOR) 10 MG tablet Take 1 tablet (10 mg total) by mouth daily.    triamcinolone cream (KENALOG) 0.1 % Apply 1 application topically 2 (two) times daily as needed for rash.    No facility-administered encounter medications on file as of 05/18/2021.    Reviewed chart for medication changes ahead of medication coordination call.  No OVs, Consults, or hospital visits since last care coordination call/Pharmacist visit. (If appropriate, list visit date, provider name)  No medication changes indicated OR if recent visit, treatment plan here.  BP Readings from Last 3 Encounters:  05/18/21 120/70  05/08/21 (!) 101/57   04/06/21 134/73    No results found for: HGBA1C   Patient obtains medications through Adherence Packaging  30 Days   Last adherence delivery included: (medication name and frequency) Levothyroxine 50 mcg take 1 tab daily Sertraline 100 mg take 1 tab daily Simvastatin 10 mg take 1 tab daily Ca 500 mg - Vitamin D 1 tab daily  Patient is due for next adherence delivery on: 05/24/21. Called patient and reviewed medications and coordinated delivery.  This delivery to include: Levothyroxine 50 mcg take 1 tab daily Sertraline 100 mg take 1 tab daily Simvastatin 10 mg take 1 tab daily   Confirmed delivery date of 05/24/21, advised patient that pharmacy will contact them the morning of delivery.  Star Rating Drugs: Simvastatin - last fill 04/25/21 [place], RMA Clinical Pharmacists Assistant [phone]  Time Spent: [phone]

## 2021-05-18 NOTE — ED Notes (Signed)
Pt ambulated to [place] independently, steady gait

## 2021-05-18 NOTE — ED Triage Notes (Signed)
Pt comes from home, bf called d/t pt weakness and lethargy, malnourishment, bruising on body. EMS reports poor living conditions (social services has been out multiple times for this). Pt currently can't walk, but unsure if that's her baseline. Pt A&O X4, GCS 15. Received 535ml NS by EMS  BP 100/50 HR 60 bgl 124 98%ra RR 16
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
## 2021-06-07 ENCOUNTER — Encounter: Payer: Self-pay | Admitting: Internal Medicine

## 2021-06-07 ENCOUNTER — Other Ambulatory Visit: Payer: Self-pay

## 2021-06-07 ENCOUNTER — Ambulatory Visit (INDEPENDENT_AMBULATORY_CARE_PROVIDER_SITE_OTHER): Payer: Medicare Other | Admitting: Internal Medicine

## 2021-06-07 VITALS — BP 126/82 | HR 85 | Temp 98.3°F | Resp 16 | Ht 64.0 in | Wt 87.0 lb

## 2021-06-07 DIAGNOSIS — Z720 Tobacco use: Secondary | ICD-10-CM

## 2021-06-07 DIAGNOSIS — E039 Hypothyroidism, unspecified: Secondary | ICD-10-CM | POA: Diagnosis not present

## 2021-06-07 DIAGNOSIS — J189 Pneumonia, unspecified organism: Secondary | ICD-10-CM

## 2021-06-07 DIAGNOSIS — E519 Thiamine deficiency, unspecified: Secondary | ICD-10-CM | POA: Diagnosis not present

## 2021-06-07 DIAGNOSIS — Z1231 Encounter for screening mammogram for malignant neoplasm of breast: Secondary | ICD-10-CM

## 2021-06-07 NOTE — Patient Instructions (Signed)

## 2021-06-07 NOTE — Progress Notes (Signed)
Subjective:  Patient ID: Michelle [name], female    DOB: [birth year]  Age: 70 y.o. MRN: [id number]  CC: Anemia and Hypothyroidism  This visit occurred during the SARS-CoV-2 public health emergency.  Safety protocols were in place, including screening questions prior to the visit, additional usage of staff PPE, and extensive cleaning of exam room while observing appropriate contact time as indicated for disinfecting solutions.    HPI Michelle [name] presents for f/up - She was recently admitted for several medical issues.  She tells me she is feeling better.  She has gained a few pounds.  She denies cough, shortness of breath, fever, chills, night sweats.   Admit date: 05/06/2021 Discharge date: 05/08/2021   Admitted From: Home Disposition: Home   Recommendations for Outpatient Follow-up:  Follow up with PCP in 1-2 weeks Follow up with Cardiology as an outpatient  Repeat chest x-ray in 3 to 6 weeks Please obtain CMP/CBC, Mag, Phos in one week Please follow up on the following pending results:   Home Health: No Equipment/Devices: None   Discharge Condition: Stable  CODE STATUS: FULL CODE Diet recommendation: Regular Diet    Brief/Interim Summary: The patient is a 70 year old Caucasian thin female with a past medical history significant for but not limited to tetralogy of Fallot status postsurgical repair 1958, anxiety depression, hypertension, CKD stage IIIa, dyslipidemia, hypothyroidism as well as other comorbidities who presented to the ED with a complaints.  She is extremely poor historian and history was difficult to obtain and when she was asked why she came to the hospital she reportedly had but stated that she had problems with her equilibrium and falling.  She then told the admitting physician that she has troubles with her memory and has dementia and does not been taking Aricept given that it is an allergy.  Also complained of some dyspnea.  She is evaluated and a CT of the  head showed no acute process but chest x-ray did show some mild pulmonary edema.  She is given a dose of IV Lasix and hospitalist was called to admit this patient.  Given her confusion she underwent an MRI which showed no acute infarct but had incomplete study.  Her repeat chest x-ray showed no evidence of pulmonary edema but did show a right lower lobe possible pneumonia.  She is started on empiric antibiotics for 5 days.  She felt better and was back to her baseline.  She is deemed stable to be discharged home and Follow-up with PCP and cardiology in outpatient setting.   Discharge Diagnoses:  Active Problems:   Dyslipidemia   Tetralogy of Fallot s/p repair   Vertigo   Cachexia ([place])   Pulmonary edema   Right Lower Lobe PNA -POA -CXR this AM showed "Subtle opacity at the periphery of the RIGHT lung base is new from the preceding study. Potentially developing infection or inflammation. Consider short interval follow-up PA and lateral chest after resolution of acute symptoms. No edema or effusion." -She does have a cough and had a Leukocytosis and went from 13.8 -> 12.7 -Started Cefdinir and Azithro po x5 Days -Repeat CXR in 3-6 weeks    Pulmonary edema in the setting of acute diastolic CHF -Chest x-ray on admission demonstrates pulmonary edema - History of tetralogy of Fallot and tobacco abuse -Currently maintaining sats on room air - BNP elevated at 340.7 - She received one-time dose of IV Lasix in the ED and improved significantly - Albuterol as needed - We will  order echocardiogram and this is as below and showed grade 1 diastolic dysfunction -Reassess for IV Lasix in the a.m. and she did not need any -Strict I's and O's and daily weights -PT OT evaluating and treating and recommended no follow-up and she is deemed stable for discharge 53 to follow-up with her PCP and cardiology in the outpatient setting   Vertigo - Patient describes what sounds like vertigo at times - CT the head  showed no acute processes - PT consulted and recommending no follow-up -Fall precautions -We will check MRI and showed no acute infarct but was an incomplete study   Confusion -Check MRI as above and showed no infarct -continue monitor and placed on delirium precautions -Improved significantly and she is back to her baseline   Hypothyroidism - TSH on admission 3.7 - Continue home Synthroid   Anxiety and depression -Continue home sertraline   CKD 3a -Currently stable. baseline creatinine appears around 1.1 and improved to 15/0.99 - Recommend avoiding further NSAIDs   Dyslipidemia -Continue home statin   Headache -Continue with acetaminophen and added Fioricet -Improved prior to discharge   Darier Syndrome -C/w Triamcinalone -Has lesions on her lower abdomen; follow-up with dermatology in outpatient setting   Leukocytosis in the setting of pneumonia as above -Unclear etiology -Continue monitor for signs and symptoms of infection; currently afebrile -Repeat CBC shows improvement and she has been started on empiric antibiotics with p.o. cefdinir and p.o. azithromycin for 5 days   Underweight/severe protein calorie malnutrition -We will consult nutritionist for further evaluation recommendations -Follow-up with PCP and nutritionist in the outpatient setting     Outpatient Medications Prior to Visit  Medication Sig Dispense Refill   acetaminophen (TYLENOL) 325 MG tablet Take 2 tablets (650 mg total) by mouth every 6 (six) hours as needed for mild pain (or Fever >/= 101). 20 tablet 0   albuterol (VENTOLIN HFA) 108 (90 Base) MCG/ACT inhaler Inhale 2 puffs into the lungs every 6 (six) hours as needed for wheezing or shortness of breath. 6.7 g 0   amitriptyline (ELAVIL) 50 MG tablet TAKE 1 TABLET BY MOUTH EVERYDAY AT BEDTIME (Patient taking differently: Take by mouth at bedtime as needed.) 90 tablet 1   dicyclomine (BENTYL) 10 MG capsule Take 10 mg by mouth 3 (three) times daily  as needed for spasms.     levothyroxine (SYNTHROID) 50 MCG tablet Take 1 tablet (50 mcg total) by mouth daily. 90 tablet 1   mirtazapine (REMERON) 7.5 MG tablet Take 1 tablet (7.5 mg total) by mouth at bedtime. 90 tablet 1   Multiple Vitamin (MULTIVITAMIN) tablet Take 1 tablet by mouth daily.     sertraline (ZOLOFT) 100 MG tablet Take 1 tablet (100 mg total) by mouth daily. 90 tablet 1   simvastatin (ZOCOR) 10 MG tablet Take 1 tablet (10 mg total) by mouth daily. 90 tablet 1   triamcinolone cream (KENALOG) 0.1 % Apply 1 application topically 2 (two) times daily as needed for rash.     ondansetron (ZOFRAN) 4 MG tablet Take 1 tablet (4 mg total) by mouth every 6 (six) hours as needed for nausea. 20 tablet 0   No facility-administered medications prior to visit.    ROS Review of Systems  Constitutional:  Positive for fatigue. Negative for chills, diaphoresis and fever.  HENT: Negative.    Eyes: Negative.   Respiratory:  Negative for cough, chest tightness, shortness of breath and wheezing.   Cardiovascular:  Negative for chest pain, palpitations and leg  swelling.  Gastrointestinal:  Negative for abdominal pain, diarrhea and nausea.  Endocrine: Negative.   Genitourinary: Negative.  Negative for difficulty urinating.  Musculoskeletal:  Negative for arthralgias.  Skin: Negative.   Neurological:  Negative for dizziness, weakness and light-headedness.  Hematological:  Negative for adenopathy. Does not bruise/bleed easily.  Psychiatric/Behavioral: Negative.     Objective:  BP 126/82 (BP Location: Right Arm, Patient Position: Sitting, Cuff Size: Normal)   Pulse 85   Temp 98.3 F (36.8 C) (Oral)   Resp 16   Ht 5\' 4"  (1.626 m)   Wt 87 lb (39.5 kg)   SpO2 96%   BMI 14.93 kg/m   BP Readings from Last 3 Encounters:  06/07/21 126/82  05/18/21 120/70  05/08/21 (!) 101/57    Wt Readings from Last 3 Encounters:  06/07/21 87 lb (39.5 kg)  05/18/21 85 lb 15.7 oz (39 kg)  05/08/21 87 lb  8.4 oz (39.7 kg)    Physical Exam Vitals reviewed.  Constitutional:      General: She is not in acute distress.    Appearance: She is ill-appearing. She is not toxic-appearing or diaphoretic.  HENT:     Nose: Nose normal.     Mouth/Throat:     Mouth: Mucous membranes are moist.  Eyes:     General: No scleral icterus.    Conjunctiva/sclera: Conjunctivae normal.  Cardiovascular:     Rate and Rhythm: Normal rate and regular rhythm.  Pulmonary:     Effort: Pulmonary effort is normal.     Breath sounds: No stridor. No wheezing, rhonchi or rales.  Abdominal:     General: Abdomen is flat.     Palpations: There is no mass.     Tenderness: There is no abdominal tenderness. There is no guarding.  Musculoskeletal:        General: Normal range of motion.     Cervical back: Neck supple.     Right lower leg: No edema.     Left lower leg: No edema.  Lymphadenopathy:     Cervical: No cervical adenopathy.  Skin:    General: Skin is warm and dry.     Findings: Rash present.     Comments: Large swaths of coalesced, scaly, thickened macules and papules on the trunk and extremities.  Neurological:     General: No focal deficit present.     Mental Status: She is alert.  Psychiatric:        Mood and Affect: Mood normal.        Behavior: Behavior normal.    Lab Results  Component Value Date   WBC 14.2 (H) 05/18/2021   HGB 11.6 (L) 05/18/2021   HCT 36.7 05/18/2021   PLT 314 05/18/2021   GLUCOSE 85 05/18/2021   CHOL 134 03/28/2021   TRIG 106.0 03/28/2021   HDL 40.80 03/28/2021   LDLDIRECT 113 (H) 06/16/2008   LDLCALC 72 03/28/2021   ALT 14 05/18/2021   AST 28 05/18/2021   NA 142 05/18/2021   K 4.1 05/18/2021   CL 110 05/18/2021   CREATININE 1.11 (H) 05/18/2021   BUN 14 05/18/2021   CO2 27 05/18/2021   TSH 3.716 05/06/2021   INR 1.3 (H) 09/22/2020    DG Chest Portable 1 View  Result Date: 05/18/2021 CLINICAL DATA:  Right-sided chest pain and weakness. EXAM: PORTABLE CHEST 1  VIEW COMPARISON:  Chest x-ray dated May 08, 2021. FINDINGS: Normal heart size status post tricuspid and pulmonic valve replacement. Unchanged pulmonary artery enlargement.  The lungs remain hyperinflated. No focal consolidation, pleural effusion, or pneumothorax. No acute osseous abnormality. IMPRESSION: No active disease. Electronically Signed   By: Titus Dubin M.D.   On: 05/18/2021 10:22    Assessment & Plan:   Lashara was seen today for anemia and hypothyroidism.  Diagnoses and all orders for this visit:  Manifestations of thiamine deficiency- Will recheck the CBC. -     CBC with Differential/Platelet; Future  Visit for screening mammogram -     MM DIGITAL SCREENING BILATERAL; Future  Tobacco abuse -     Ambulatory Referral for Lung Cancer Scre  Acquired hypothyroidism- Will stay on the current T4 dose.  Pneumonia of right lower lobe due to infectious organism- Will repeat her CXR. -     DG Chest 2 View; Future  I have discontinued Bonnita Nasuti I. Steinfeldt's ondansetron. I am also having her maintain her amitriptyline, albuterol, sertraline, simvastatin, levothyroxine, mirtazapine, dicyclomine, triamcinolone cream, multivitamin, and acetaminophen.  No orders of the defined types were placed in this encounter.    Follow-up: Return in about 6 months (around 12/07/2021).  [name], MD

## 2021-06-09 ENCOUNTER — Telehealth: Payer: Self-pay

## 2021-06-09 DIAGNOSIS — J189 Pneumonia, unspecified organism: Secondary | ICD-10-CM | POA: Insufficient documentation

## 2021-06-09 NOTE — Telephone Encounter (Signed)
-----   Message from Michelle Lima, MD sent at 06/09/2021 11:13 AM EDT ----- Regarding: LABS and XRAY Ask her to come back in today or next week for her repeat CXR and CBC

## 2021-06-09 NOTE — Telephone Encounter (Signed)
Pt has been informed and will have to arrange for a ride to bring her.

## 2021-06-13 ENCOUNTER — Other Ambulatory Visit: Payer: Self-pay | Admitting: *Deleted

## 2021-06-13 ENCOUNTER — Encounter: Payer: Self-pay | Admitting: Acute Care

## 2021-06-13 ENCOUNTER — Telehealth: Payer: Self-pay | Admitting: Pharmacist

## 2021-06-13 DIAGNOSIS — F17210 Nicotine dependence, cigarettes, uncomplicated: Secondary | ICD-10-CM

## 2021-06-13 DIAGNOSIS — Z87891 Personal history of nicotine dependence: Secondary | ICD-10-CM

## 2021-06-15 NOTE — Progress Notes (Signed)
    Chronic Care Management Pharmacy Assistant   Name: Michelle [name]  MRN: [id number] DOB: March 16, 1951   Medications: Outpatient Encounter Medications as of 06/13/2021  Medication Sig Note   acetaminophen (TYLENOL) 325 MG tablet Take 2 tablets (650 mg total) by mouth every 6 (six) hours as needed for mild pain (or Fever >/= 101).    albuterol (VENTOLIN HFA) 108 (90 Base) MCG/ACT inhaler Inhale 2 puffs into the lungs every 6 (six) hours as needed for wheezing or shortness of breath.    amitriptyline (ELAVIL) 50 MG tablet TAKE 1 TABLET BY MOUTH EVERYDAY AT BEDTIME (Patient taking differently: Take by mouth at bedtime as needed.)    dicyclomine (BENTYL) 10 MG capsule Take 10 mg by mouth 3 (three) times daily as needed for spasms.    levothyroxine (SYNTHROID) 50 MCG tablet Take 1 tablet (50 mcg total) by mouth daily.    mirtazapine (REMERON) 7.5 MG tablet Take 1 tablet (7.5 mg total) by mouth at bedtime. 05/07/2021: Pt has not started this medication   Multiple Vitamin (MULTIVITAMIN) tablet Take 1 tablet by mouth daily.    sertraline (ZOLOFT) 100 MG tablet Take 1 tablet (100 mg total) by mouth daily.    simvastatin (ZOCOR) 10 MG tablet Take 1 tablet (10 mg total) by mouth daily.    triamcinolone cream (KENALOG) 0.1 % Apply 1 application topically 2 (two) times daily as needed for rash.    No facility-administered encounter medications on file as of 06/13/2021.   Pharmacist Review  Made 3 attempts to reach patient for medication review for delivery. Left message for patient to return call, unable to reach  [place] Pharmacist Assistant [phone]   Time spent:11

## 2021-06-20 ENCOUNTER — Telehealth: Payer: Self-pay | Admitting: Internal Medicine

## 2021-06-20 NOTE — Telephone Encounter (Signed)
Michelle [name] with Adult Protective Services has been informed and expressed understanding. He would also like to know if you could enter a referral for home health for the pt. He also would like to know your thoughts on her memory and if you have any concerns with it. Please advise.

## 2021-06-20 NOTE — Telephone Encounter (Signed)
  Mr Michelle [name] from [place] calling with questions. He would like to know if patient is following up appropriately after hospital visits, does patient need home health, would patient benefit from going to SNF.  Please call [phone]

## 2021-06-24 ENCOUNTER — Other Ambulatory Visit: Payer: Self-pay | Admitting: Internal Medicine

## 2021-06-24 DIAGNOSIS — K582 Mixed irritable bowel syndrome: Secondary | ICD-10-CM

## 2021-07-13 ENCOUNTER — Telehealth: Payer: Self-pay | Admitting: Pharmacist

## 2021-07-13 NOTE — Progress Notes (Addendum)
    Chronic Care Management Pharmacy Assistant   Name: JMIYAH BOGDANOWICZ  MRN: [id number] DOB: Aug 01, 1951   Reason for Encounter: Medication [facility] visits:  None in previous 6 months  Medications: Outpatient Encounter Medications as of 07/13/2021  Medication Sig Note   acetaminophen (TYLENOL) 325 MG tablet Take 2 tablets (650 mg total) by mouth every 6 (six) hours as needed for mild pain (or Fever >/= 101).    albuterol (VENTOLIN HFA) 108 (90 Base) MCG/ACT inhaler Inhale 2 puffs into the lungs every 6 (six) hours as needed for wheezing or shortness of breath.    amitriptyline (ELAVIL) 50 MG tablet TAKE 1 TABLET BY MOUTH EVERYDAY AT BEDTIME (Patient taking differently: Take by mouth at bedtime as needed.)    dicyclomine (BENTYL) 10 MG capsule TAKE 1 CAPSULE BY MOUTH 3 TIMES DAILY BEFORE MEALS.    levothyroxine (SYNTHROID) 50 MCG tablet Take 1 tablet (50 mcg total) by mouth daily.    mirtazapine (REMERON) 7.5 MG tablet Take 1 tablet (7.5 mg total) by mouth at bedtime. 05/07/2021: Pt has not started this medication   Multiple Vitamin (MULTIVITAMIN) tablet Take 1 tablet by mouth daily.    sertraline (ZOLOFT) 100 MG tablet Take 1 tablet (100 mg total) by mouth daily.    simvastatin (ZOCOR) 10 MG tablet Take 1 tablet (10 mg total) by mouth daily.    triamcinolone cream (KENALOG) 0.1 % Apply 1 application topically 2 (two) times daily as needed for rash.    No facility-administered encounter medications on file as of 07/13/2021.    Reviewed chart for medication changes ahead of medication coordination call.  No OVs, Consults, or hospital visits since last care coordination call/Pharmacist visit. (If appropriate, list visit date, provider name)  No medication changes indicated OR if recent visit, treatment plan here.  BP Readings from Last 3 Encounters:  06/07/21 126/82  05/18/21 120/70  05/08/21 (!) 101/57    No results found for: HGBA1C   Patient obtains medications through  Adherence Packaging  30 Days   Last adherence delivery included:  Levothyroxine 50 mcg take 1 tab daily Sertraline 100 mg take 1 tab daily Simvastatin 10 mg take 1 tab daily  Patient is due for next adherence delivery on: 07/24/21.  Called patient and reviewed medications and coordinated delivery. This delivery to include: Levothyroxine 50 mcg take 1 tab daily Sertraline 100 mg take 1 tab daily Simvastatin 10 mg take 1 tab daily Ca 500 mg - Vitamin D 1 tab daily   Patient needs refills for none noted  Patient is not taking mirtazapine. Discussed with PharmD and will discontinue.  Confirmed delivery date of 07/24/21, advised patient that pharmacy will contact them the morning of delivery.    Star Rating Drugs: Simvastatin 10 mg- last fill 06/17/21 30 ds  Ethelene Hal Clinical Pharmacist Assistant [phone]   Time spent:25

## 2021-07-14 NOTE — Addendum Note (Signed)
Addended by: Charlton Haws on: 07/14/2021 09:20 AM   Modules accepted: Orders

## 2021-07-18 ENCOUNTER — Encounter (HOSPITAL_COMMUNITY): Payer: Self-pay | Admitting: Emergency Medicine

## 2021-07-18 ENCOUNTER — Other Ambulatory Visit: Payer: Self-pay

## 2021-07-18 ENCOUNTER — Emergency Department (HOSPITAL_COMMUNITY)
Admission: EM | Admit: 2021-07-18 | Discharge: 2021-07-18 | Disposition: A | Discharge status: Home / Self Care | Payer: Medicare Other | Attending: Emergency Medicine | Admitting: Emergency Medicine

## 2021-07-18 ENCOUNTER — Emergency Department (HOSPITAL_COMMUNITY): Payer: Medicare Other

## 2021-07-18 DIAGNOSIS — R404 Transient alteration of awareness: Secondary | ICD-10-CM | POA: Diagnosis not present

## 2021-07-18 DIAGNOSIS — I4510 Unspecified right bundle-branch block: Secondary | ICD-10-CM | POA: Diagnosis not present

## 2021-07-18 DIAGNOSIS — E039 Hypothyroidism, unspecified: Secondary | ICD-10-CM | POA: Insufficient documentation

## 2021-07-18 DIAGNOSIS — Z20822 Contact with and (suspected) exposure to covid-19: Secondary | ICD-10-CM | POA: Insufficient documentation

## 2021-07-18 DIAGNOSIS — J40 Bronchitis, not specified as acute or chronic: Secondary | ICD-10-CM | POA: Diagnosis not present

## 2021-07-18 DIAGNOSIS — R072 Precordial pain: Secondary | ICD-10-CM

## 2021-07-18 DIAGNOSIS — R079 Chest pain, unspecified: Secondary | ICD-10-CM | POA: Diagnosis not present

## 2021-07-18 DIAGNOSIS — Z743 Need for continuous supervision: Secondary | ICD-10-CM | POA: Diagnosis not present

## 2021-07-18 DIAGNOSIS — Z87891 Personal history of nicotine dependence: Secondary | ICD-10-CM | POA: Insufficient documentation

## 2021-07-18 DIAGNOSIS — I129 Hypertensive chronic kidney disease with stage 1 through stage 4 chronic kidney disease, or unspecified chronic kidney disease: Secondary | ICD-10-CM | POA: Insufficient documentation

## 2021-07-18 DIAGNOSIS — R0789 Other chest pain: Secondary | ICD-10-CM | POA: Diagnosis not present

## 2021-07-18 DIAGNOSIS — N1832 Chronic kidney disease, stage 3b: Secondary | ICD-10-CM | POA: Diagnosis not present

## 2021-07-18 LAB — HEPATIC FUNCTION PANEL
ALT: 10 U/L (ref 0–44)
AST: 21 U/L (ref 15–41)
Albumin: 3.4 g/dL — ABNORMAL LOW (ref 3.5–5.0)
Alkaline Phosphatase: 51 U/L (ref 38–126)
Bilirubin, Direct: <0.1 mg/dL (ref 0.0–0.2)
Total Bilirubin: 0.6 mg/dL (ref 0.3–1.2)
Total Protein: 6.1 g/dL — ABNORMAL LOW (ref 6.5–8.1)

## 2021-07-18 LAB — BASIC METABOLIC PANEL
Anion gap: 6 (ref 5–15)
BUN: 21 mg/dL (ref 8–23)
CO2: 25 mmol/L (ref 22–32)
Calcium: 8.4 mg/dL — ABNORMAL LOW (ref 8.9–10.3)
Chloride: 106 mmol/L (ref 98–111)
Creatinine, Ser: 0.99 mg/dL (ref 0.44–1.00)
GFR, Estimated: >60 mL/min (ref >60)
Glucose, Bld: 85 mg/dL (ref 70–99)
Potassium: 4.5 mmol/L (ref 3.5–5.1)
Sodium: 137 mmol/L (ref 135–145)

## 2021-07-18 LAB — CBC
HCT: 39.7 % (ref 36.0–46.0)
Hemoglobin: 12.6 g/dL (ref 12.0–15.0)
MCH: 29.4 pg (ref 26.0–34.0)
MCHC: 31.7 g/dL (ref 30.0–36.0)
MCV: 92.8 fL (ref 80.0–100.0)
Platelets: 247 10*3/uL (ref 150–400)
RBC: 4.28 MIL/uL (ref 3.87–5.11)
RDW: 15.2 % (ref 11.5–15.5)
WBC: 6.4 10*3/uL (ref 4.0–10.5)
nRBC: 0 % (ref 0.0–0.2)

## 2021-07-18 LAB — TROPONIN I (HIGH SENSITIVITY)
Troponin I (High Sensitivity): 6 ng/L (ref <18)
Troponin I (High Sensitivity): 6 ng/L (ref <18)

## 2021-07-18 LAB — LIPASE, BLOOD: Lipase: 59 U/L — ABNORMAL HIGH (ref 11–51)

## 2021-07-18 LAB — RESP PANEL BY RT-PCR (FLU A&B, COVID) ARPGX2
Influenza A by PCR: NEGATIVE
Influenza B by PCR: NEGATIVE
SARS Coronavirus 2 by RT PCR: NEGATIVE

## 2021-07-18 MED ORDER — ALBUTEROL SULFATE HFA 108 (90 BASE) MCG/ACT IN AERS: 1.0000 | INHALATION_SPRAY | Freq: Four times a day (QID) | RESPIRATORY_TRACT | 0 refills | Status: DC | PRN

## 2021-07-18 MED ORDER — AZITHROMYCIN 250 MG PO TABS: 250.0000 mg | ORAL_TABLET | Freq: Every day | ORAL | 0 refills | Status: DC

## 2021-07-18 NOTE — ED Provider Notes (Signed)
Emergency Medicine Provider Triage Evaluation Note  Michelle [name] , a 70 y.o. female  was evaluated in triage.  Pt complains of chest pain that started a few days ago. Denies sob and cough.  Review of Systems  Positive: Chest pain Negative: Sob, cough  Physical Exam  BP 113/87   Pulse 62   Temp 98.3 F (36.8 C) (Oral)   Resp 16   SpO2 97%  Gen:   Awake, no distress   Resp:  Normal effort  MSK:   Moves extremities without difficulty  Other:  Lungs ctab, heart with rrr  Medical Decision Making  Medically screening exam initiated at 12:34 PM.  Appropriate orders placed.  [name] was informed that the remainder of the evaluation will be completed by another provider, this initial triage assessment does not replace that evaluation, and the importance of remaining in the ED until their evaluation is complete.     Bishop Dublin 07/18/21 1236    Wyvonnia Dusky, MD 07/18/21 [phone]
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
## 2021-07-18 NOTE — Discharge Instructions (Addendum)
You were seen in the emergency department today with chest discomfort with some cough and throat congestion.  I am sending you home with a prescription for albuterol along with an antibiotic treating for possible bronchitis.  Your COVID and flu tests were negative.  Your x-ray did not show pneumonia.  Your lab work did not show signs of a heart attack.  Please discuss your symptoms with your primary care doctor by calling the office tomorrow morning to schedule the next available follow-up appointment.  If your symptoms suddenly worsen please return to the emergency department and/or call 911.

## 2021-07-18 NOTE — ED Triage Notes (Signed)
Patient presents to the ED by EMS with c/o chest pain along with anxiety intermittently since last night. Chx pain isolated to the left side without radiation. EKG SR with BB, ASA PTA.  She is dealing with some social issues with a friend.

## 2021-07-30 ENCOUNTER — Emergency Department (HOSPITAL_COMMUNITY)
Admission: EM | Admit: 2021-07-30 | Discharge: 2021-07-31 | Disposition: A | Discharge status: Home / Self Care | Payer: Medicare Other | Attending: Emergency Medicine | Admitting: Emergency Medicine

## 2021-07-30 ENCOUNTER — Emergency Department (HOSPITAL_COMMUNITY): Payer: Medicare Other

## 2021-07-30 DIAGNOSIS — Z87891 Personal history of nicotine dependence: Secondary | ICD-10-CM | POA: Diagnosis not present

## 2021-07-30 DIAGNOSIS — I129 Hypertensive chronic kidney disease with stage 1 through stage 4 chronic kidney disease, or unspecified chronic kidney disease: Secondary | ICD-10-CM | POA: Diagnosis not present

## 2021-07-30 DIAGNOSIS — N1832 Chronic kidney disease, stage 3b: Secondary | ICD-10-CM | POA: Insufficient documentation

## 2021-07-30 DIAGNOSIS — E039 Hypothyroidism, unspecified: Secondary | ICD-10-CM | POA: Insufficient documentation

## 2021-07-30 DIAGNOSIS — R0789 Other chest pain: Secondary | ICD-10-CM | POA: Diagnosis not present

## 2021-07-30 DIAGNOSIS — R079 Chest pain, unspecified: Secondary | ICD-10-CM

## 2021-07-30 DIAGNOSIS — I499 Cardiac arrhythmia, unspecified: Secondary | ICD-10-CM | POA: Diagnosis not present

## 2021-07-30 DIAGNOSIS — R6889 Other general symptoms and signs: Secondary | ICD-10-CM | POA: Diagnosis not present

## 2021-07-30 DIAGNOSIS — Z743 Need for continuous supervision: Secondary | ICD-10-CM | POA: Diagnosis not present

## 2021-07-30 DIAGNOSIS — Z79899 Other long term (current) drug therapy: Secondary | ICD-10-CM | POA: Insufficient documentation

## 2021-07-30 DIAGNOSIS — I4510 Unspecified right bundle-branch block: Secondary | ICD-10-CM | POA: Diagnosis not present

## 2021-07-30 LAB — BASIC METABOLIC PANEL
Anion gap: 8 (ref 5–15)
BUN: 6 mg/dL — ABNORMAL LOW (ref 8–23)
CO2: 28 mmol/L (ref 22–32)
Calcium: 9.2 mg/dL (ref 8.9–10.3)
Chloride: 102 mmol/L (ref 98–111)
Creatinine, Ser: 1.11 mg/dL — ABNORMAL HIGH (ref 0.44–1.00)
GFR, Estimated: 53 mL/min — ABNORMAL LOW (ref >60)
Glucose, Bld: 85 mg/dL (ref 70–99)
Potassium: 3.7 mmol/L (ref 3.5–5.1)
Sodium: 138 mmol/L (ref 135–145)

## 2021-07-30 LAB — CBC
HCT: 44.1 % (ref 36.0–46.0)
Hemoglobin: 14.3 g/dL (ref 12.0–15.0)
MCH: 29.7 pg (ref 26.0–34.0)
MCHC: 32.4 g/dL (ref 30.0–36.0)
MCV: 91.5 fL (ref 80.0–100.0)
Platelets: 245 10*3/uL (ref 150–400)
RBC: 4.82 MIL/uL (ref 3.87–5.11)
RDW: 14.4 % (ref 11.5–15.5)
WBC: 5.8 10*3/uL (ref 4.0–10.5)
nRBC: 0 % (ref 0.0–0.2)

## 2021-07-30 LAB — TROPONIN I (HIGH SENSITIVITY): Troponin I (High Sensitivity): 9 ng/L (ref <18)

## 2021-07-30 NOTE — ED Triage Notes (Addendum)
Pt BIB [facility] for eval of CP that began approx 4.5hrs ago. Pain is L side, non-radiating, "pressure, like someone is pressing on it." Hx anxiety, HTN, HLD, tricuspid valve repair, pulmonary valve replacement, CKD3 18 L forearm 324 ASA  VSS

## 2021-07-31 DIAGNOSIS — R0789 Other chest pain: Secondary | ICD-10-CM | POA: Diagnosis not present

## 2021-07-31 LAB — TROPONIN I (HIGH SENSITIVITY): Troponin I (High Sensitivity): 8 ng/L (ref <18)

## 2021-07-31 NOTE — Discharge Instructions (Addendum)
Please follow up with your primary care provider regarding ED visit today  Return tot he ED for any new/worsening symptoms
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
## 2021-07-31 NOTE — ED Notes (Signed)
Reviewed discharge instructions with patient. Follow-up care reviewed. Patient verbalized understanding. Patient A&Ox4, VSS, and ambulatory with steady gait upon discharge.  

## 2021-08-04 ENCOUNTER — Telehealth: Payer: Medicare Other

## 2021-08-09 ENCOUNTER — Ambulatory Visit: Payer: Medicare Other

## 2021-08-10 ENCOUNTER — Telehealth: Payer: Self-pay | Admitting: Pharmacist

## 2021-08-10 NOTE — Progress Notes (Signed)
    Chronic Care Management Pharmacy Assistant   Name: AQUILAH CUNDARI  MRN: [id number] DOB: 07-23-1951   Reason for Encounter: Medication Review    Recent office visits:  None ID  Recent consult visits:  None ID  Hospital visits:  Medication Reconciliation was completed by comparing discharge summary, patient's EMR and Pharmacy list, and upon discussion with patient.  Admitted to the hospital on 07/30/21 due to chest pain. Discharge date was 07/31/21. Discharged from [facility].    Admitted to the hospital on 07/18/21 due to chest pain. Discharge date was 07/18/21. Discharged from [facility].    -Started Albuterol Inhaler and Azithromycin 250 mg  Medications that remain the same after Hospital Discharge:??  -All other medications will remain the same.    Medications: Outpatient Encounter Medications as of 08/10/2021  Medication Sig   acetaminophen (TYLENOL) 325 MG tablet Take 2 tablets (650 mg total) by mouth every 6 (six) hours as needed for mild pain (or Fever >/= 101).   albuterol (VENTOLIN HFA) 108 (90 Base) MCG/ACT inhaler Inhale 1-2 puffs into the lungs every 6 (six) hours as needed for wheezing or shortness of breath.   amitriptyline (ELAVIL) 50 MG tablet TAKE 1 TABLET BY MOUTH EVERYDAY AT BEDTIME (Patient taking differently: Take by mouth at bedtime as needed.)   azithromycin (ZITHROMAX) 250 MG tablet Take 1 tablet (250 mg total) by mouth daily. Take first 2 tablets together, then 1 every day until finished.   dicyclomine (BENTYL) 10 MG capsule TAKE 1 CAPSULE BY MOUTH 3 TIMES DAILY BEFORE MEALS.   levothyroxine (SYNTHROID) 50 MCG tablet Take 1 tablet (50 mcg total) by mouth daily.   Multiple Vitamin (MULTIVITAMIN) tablet Take 1 tablet by mouth daily.   sertraline (ZOLOFT) 100 MG tablet Take 1 tablet (100 mg total) by mouth daily.   simvastatin (ZOCOR) 10 MG tablet Take 1 tablet (10 mg total) by mouth daily.   triamcinolone cream (KENALOG) 0.1 % Apply 1  application topically 2 (two) times daily as needed for rash.   No facility-administered encounter medications on file as of 08/10/2021.   Reviewed chart for medication changes ahead of medication coordination call.  No OVs, Consults, or hospital visits since last care coordination call/Pharmacist visit. (If appropriate, list visit date, provider name)  No medication changes indicated OR if recent visit, treatment plan here.  BP Readings from Last 3 Encounters:  07/31/21 118/79  07/18/21 (!) 143/94  06/07/21 126/82    No results found for: HGBA1C   Patient obtains medications through Adherence Packaging  30 Days   Last adherence delivery included:  Levothyroxine 50 mcg take 1 tab daily Sertraline 100 mg take 1 tab daily Simvastatin 10 mg take 1 tab daily Ca 500 mg - Vitamin D 1 tab dailyrence delivery included:   Patient is due for next adherence delivery on: 08/22/21.  Called patient and reviewed medications and coordinated delivery. This delivery to include: Levothyroxine 50 mcg take 1 tab daily Sertraline 100 mg take 1 tab daily Simvastatin 10 mg take 1 tab daily Ca 500 mg - Vitamin D 1 tab daily   Patient needs refills for none noted.  Confirmed delivery date of 08/22/21, advised patient that pharmacy will contact them the morning of delivery.   Star Rating Drugs: Simvastatin 10 mg-last fill 07/17/21 90 ds   [place] Pharmacist Assistant [phone]   Time spent:29

## 2021-09-06 ENCOUNTER — Other Ambulatory Visit: Payer: Self-pay | Admitting: Internal Medicine

## 2021-09-06 DIAGNOSIS — F419 Anxiety disorder, unspecified: Secondary | ICD-10-CM

## 2021-09-12 ENCOUNTER — Ambulatory Visit (INDEPENDENT_AMBULATORY_CARE_PROVIDER_SITE_OTHER): Payer: Medicare Other | Admitting: Pharmacist

## 2021-09-12 ENCOUNTER — Other Ambulatory Visit: Payer: Self-pay

## 2021-09-12 DIAGNOSIS — R64 Cachexia: Secondary | ICD-10-CM

## 2021-09-12 DIAGNOSIS — M810 Age-related osteoporosis without current pathological fracture: Secondary | ICD-10-CM

## 2021-09-12 DIAGNOSIS — E785 Hyperlipidemia, unspecified: Secondary | ICD-10-CM

## 2021-09-12 DIAGNOSIS — F320 Major depressive disorder, single episode, mild: Secondary | ICD-10-CM

## 2021-09-12 NOTE — Progress Notes (Signed)
Chronic Care Management Pharmacy Note  09/13/2021 Name:  Michelle [name] MRN:  [id number] DOB:  [birth year]  Summary: -Pt endorses missing medication doses sometimes; she currently has at least 2 weeks of extra pill packs on hand -Pt reports she does not have enough food to eat; she reports she is not very hungry anyway but sometimes does not eat 1 solid meal in a day. I do not believe appetite-stimulants will be particularly helpful for gaining weight until she has access to food  Recommendations/Changes made from today's visit: -Refer to Care Guide for help with food insecurity - Meals on Wheels -Scheduled next pill pack delivery for 2 weeks later than original date (10/04/21)   Subjective: Michelle [name] is an 70 y.o. year old female who is a primary patient of [name], MD.  The CCM team was consulted for assistance with disease management and care coordination needs.    Engaged with patient by telephone for follow up visit in response to provider referral for pharmacy case management and/or care coordination services.   Consent to Services:  The patient was given information about Chronic Care Management services, agreed to services, and gave verbal consent prior to initiation of services.  Please see initial visit note for detailed documentation.   Patient Care Team: [name], MD as PCP - General (Internal Medicine) Charlton Haws, [facility] as Pharmacist (Pharmacist)   Patient lives at home with a roommate. She has significant memory problems/dementia and has seen neurology previously but not in past 2 years. She admits it is difficult for her to get food due to financial issues. She has tried to get Meals on Wheels before but lost the number.  Recent office visits: 06/07/21 Dr Ronnald Ramp OV: hospital f/u; ordered repeat labs and CXR however pt did not get them done. Referred for lunc cancer screening, eventually cancelled.  03/28/21 Dr Ronnald Ramp OV: chronic f/u; d/c  aspirin. Start dronabinol 5 mg BID w/ meals  12/20/20 Dr Ronnald Ramp OV: acute visit for head injury after fall. Ordered CT scan which was normal.   11/01/20 Dr Ronnald Ramp OV: hospital f/u. K and Mg normal, can stop mag and K supplement. Referred for lung cancer screening CT. Started dronabinol for cachexia (not filled). Referred to nephrology.   08/15/20 Dr Ronnald Ramp OV: 3 wk hx of abd pain/cramping, alternating constipation/diarhhea. Started dicylcomine 10 mg  Recent consult visits: None in previous 6 months.   Hospital visits: Medication Reconciliation was completed by comparing discharge summary, patient's EMR and Pharmacy list, and upon discussion with patient.  Admitted to the ED on 07/30/21 due to nonspecific chest pain. Discharge date was 07/31/21. Discharged from [facility].   -negative cardiac workup. F/u with PCP  Medications that remain the same after Hospital Discharge:??  -All other medications will remain the same.    Admitted to the ED on 07/18/21 due to chest pain, anxiety. Discharge date was 07/18/21. Discharged from [facility].   -negative cardiac workup  New?Medications Started at [facility] Discharge:?? -started azithromycin and albuterol HFA due to possible bronchitis  Medications that remain the same after Hospital Discharge:??  -All other medications will remain the same.    05/18/21 ED visit: generalized weakness. Pt is not sure why she was brought to ED - BF apparently called EMS.  05/06/21-05/08/21 hospitalization: vertigo, cachexia, confusion. MRI brain w/o acute findings. CXR w/ posible RLL PNA. Given empiric abx. ECHO showed Grade 1 diastolic dysfunction.  04/06/21 ED visit: mechanical fall. CT  scan, EKG, labs all wnl. Stable for discharge. No med changes.  09/29/20 hospitalization: lightheadedness, presyncopal episode. Likely d/t dehydration w/ poor oral intake after recent COVID infection. Treated with IV fluids. Home health concerned with lack of food. Declines  SNF/ALF. APS following.  09/22/20-09/25/20 hospitalization: COVID positive (GI sx), dehydration, hypotension, AKI. Given monoclonal antibody infusion for COVID. Stopped Edarbyclor at discharge.  Objective:  Lab Results  Component Value Date   CREATININE 1.11 (H) 07/30/2021   BUN 6 (L) 07/30/2021   GFR 47.57 (L) 03/28/2021   GFRNONAA 53 (L) 07/30/2021   GFRAA 64 07/12/2020   NA 138 07/30/2021   K 3.7 07/30/2021   CALCIUM 9.2 07/30/2021   CO2 28 07/30/2021    Lab Results  Component Value Date/Time   GFR 47.57 (L) 03/28/2021 02:00 PM   GFR 48.20 (L) 11/01/2020 02:32 PM    Last diabetic Eye exam: No results found for: HMDIABEYEEXA  Last diabetic Foot exam: No results found for: HMDIABFOOTEX   Lab Results  Component Value Date   CHOL 134 03/28/2021   HDL 40.80 03/28/2021   LDLCALC 72 03/28/2021   LDLDIRECT 113 (H) 06/16/2008   TRIG 106.0 03/28/2021   CHOLHDL 3 03/28/2021    Hepatic Function Latest Ref Rng & Units 07/18/2021 05/18/2021 05/08/2021  Total Protein 6.5 - 8.1 g/dL 6.1(L) 5.9(L) 6.4(L)  Albumin 3.5 - 5.0 g/dL 3.4(L) 3.0(L) 2.7(L)  AST 15 - 41 U/L _0 ALT 0 - 44 U/L _1 Alk Phosphatase 38 - 126 U/L 51 57 82  Total Bilirubin 0.3 - 1.2 mg/dL 0.6 0.6 0.6  Bilirubin, Direct 0.0 - 0.2 mg/dL <0.1 - -    Lab Results  Component Value Date/Time   TSH 3.716 05/06/2021 08:40 PM   TSH 1.81 03/28/2021 02:00 PM   TSH 1.420 09/22/2020 02:30 AM   TSH 2.01 07/12/2020 02:38 PM    CBC Latest Ref Rng & Units 07/30/2021 07/18/2021 05/18/2021  WBC 4.0 - 10.5 K/uL 5.8 6.4 14.2(H)  Hemoglobin 12.0 - 15.0 g/dL 14.3 12.6 11.6(L)  Hematocrit 36.0 - 46.0 % 44.1 39.7 36.7  Platelets 150 - 400 K/uL 245 247 314    No results found for: VD25OH  Clinical ASCVD: No  The 10-year ASCVD risk score (Arnett DK, et al., 2019) is: 8.6%   Values used to calculate the score:     Age: 11 years     Sex: Female     Is Non-Hispanic African American: No     Diabetic: No     Tobacco  smoker: No     Systolic Blood Pressure: 528 mmHg     Is BP treated: No     HDL Cholesterol: 40.8 mg/dL     Total Cholesterol: 134 mg/dL    Depression screen [facility] 2/9 03/28/2021 12/02/2020 11/01/2020  Decreased Interest 0 0 0  Down, Depressed, Hopeless 0 0 1  PHQ - 2 Score 0 0 1  Altered sleeping 0 - 0  Tired, decreased energy 0 - 0  Change in appetite 0 - 0  Feeling bad or failure about yourself  0 - 0  Trouble concentrating 0 - 0  Moving slowly or fidgety/restless 0 - 0  Suicidal thoughts 0 - 0  PHQ-9 Score 0 - 1  Difficult doing work/chores - - -  Some recent data might be hidden    GAD 7 : Generalized Anxiety Score 12/02/2019 10/29/2017  Nervous, Anxious, on Edge 1 3  Control/stop worrying 0 1  Worry too [name] - different things 0 2  Trouble relaxing 0 0  Restless 1 1  Easily annoyed or irritable 0 2  Afraid - awful might happen 0 0  Total GAD 7 Score 2 9  Anxiety Difficulty Not difficult at all -    Social History   Tobacco Use  Smoking Status Former   Packs/day: 1.00   Years: 43.00   Pack years: 43.00   Types: Cigarettes   Quit date: 11/02/2019   Years since quitting: 1.8  Smokeless Tobacco Never  Tobacco Comments   Quit smoking almost 3 years ago (per pt on 06/27/2017)   BP Readings from Last 3 Encounters:  07/31/21 118/79  07/18/21 (!) 143/94  06/07/21 126/82   Pulse Readings from Last 3 Encounters:  07/31/21 (!) 50  07/18/21 (!) 56  06/07/21 85   Wt Readings from Last 3 Encounters:  07/18/21 86 lb (39 kg)  06/07/21 87 lb (39.5 kg)  05/18/21 85 lb 15.7 oz (39 kg)   BMI Readings from Last 3 Encounters:  07/18/21 16.80 kg/m  06/07/21 14.93 kg/m  05/18/21 14.76 kg/m    Assessment/Interventions: Review of patient past medical history, allergies, medications, health status, including review of consultants reports, laboratory and other test data, was performed as part of comprehensive evaluation and provision of chronic care management services.    SDOH:  (Social Determinants of Health) assessments and interventions performed: Yes   CCM Care Plan  Allergies  Allergen Reactions   Aricept [Donepezil] Other (See Comments)    bradycardia   Penicillins Rash    Has patient had a PCN reaction causing immediate rash, facial/tongue/throat swelling, SOB or lightheadedness with hypotension: Yes Has patient had a PCN reaction causing severe rash involving mucus membranes or skin necrosis: No Has patient had a PCN reaction that required hospitalization: No Has patient had a PCN reaction occurring within the last 10 years: No If all of the above answers are "NO", then may proceed with Cephalosporin use.     Medications Reviewed Today     Reviewed by Charlton Haws, [facility] (Pharmacist) on 09/12/21 at 1533  Med List Status: <None>   Medication Order Taking? Sig Documenting Provider Last Dose Status Informant  acetaminophen (TYLENOL) 325 MG tablet [id number] Yes Take 2 tablets (650 mg total) by mouth every 6 (six) hours as needed for mild pain (or Fever >/= 101). Sheikh, Omair [place], DO Taking Active   albuterol (VENTOLIN HFA) 108 (90 Base) MCG/ACT inhaler [id number] Yes Inhale 1-2 puffs into the lungs every 6 (six) hours as needed for wheezing or shortness of breath. Long, Wonda Olds, MD Taking Active   calcium-vitamin D Darron Doom WITH D) 500-200 MG-UNIT tablet [id number] Yes Take 1 tablet by mouth daily. [provider] Taking Active   levothyroxine (SYNTHROID) 50 MCG tablet [id number] Yes Take 1 tablet (50 mcg total) by mouth daily. [name], MD Taking Active Self  sertraline (ZOLOFT) 100 MG tablet [id number] Yes TAKE ONE TABLET BY MOUTH AT Gildardo Griffes, MD Taking Active   simvastatin (ZOCOR) 10 MG tablet [id number] Yes Take 1 tablet (10 mg total) by mouth daily. [name], MD Taking Active Self            Patient Active Problem List   Diagnosis Date Noted   Pneumonia of right lower lobe due to infectious  organism 06/09/2021   Pulmonary edema 05/07/2021   Weight loss, abnormal 03/28/2021   Encounter for general adult medical examination with abnormal  findings 03/28/2021   Manifestations of thiamine deficiency 11/04/2020   Hypomagnesemia 11/01/2020   Tobacco abuse 11/01/2020   Cachexia ([place]) 11/01/2020   Vertigo 10/02/2020   Irritable bowel syndrome with both constipation and diarrhea 08/15/2020   Stage 3b chronic kidney disease ([place]) 01/14/2020   Tetralogy of Fallot s/p repair 12/04/2019   RBBB 12/04/2019   Current mild episode of major depressive disorder without prior episode ([place]) 10/28/2017   S/P surgical pulmonary valve replacement 03/05/2017   S/P TVR (tricuspid valve repair) 03/05/2017   Status post atrial septal defect repair 01/25/2017   Moderate tricuspid regurgitation 01/01/2017   Acquired hypothyroidism 11/08/2016   TOF (tetralogy of Fallot) 09/27/2016   Visit for screening mammogram 07/30/2016   Osteoporosis 07/30/2016   Mononeuropathy of right lower extremity 11/30/2014   Health care maintenance 11/10/2014   Congenital heart disease in adult 09/28/2014   Essential hypertension 09/17/2014   Anxiety state 09/23/2012   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   Dyslipidemia 03/19/2007    Immunization History  Administered Date(s) Administered   Fluad Quad(high Dose 65+) 08/11/2019   Influenza Split 12/02/2012   Influenza Whole 10/21/2007, 11/08/2010   Influenza, High Dose Seasonal PF 10/11/2015, 08/16/2016, 08/14/2017, 01/28/2019   Influenza,inj,Quad PF,6+ Mos 09/06/2014, 08/15/2020   Pneumococcal Conjugate-13 07/30/2016   Pneumococcal Polysaccharide-23 04/19/2015, 11/07/2016   Td 04/16/2005   Tdap 08/24/2015    Conditions to be addressed/monitored:  Hyperlipidemia, Depression, Anxiety and Osteoporosis  Care Plan : [place]  Updates made by Charlton Haws, [place] since 09/13/2021 12:00 AM     Problem: Hyperlipidemia, Depression, Anxiety and  Osteoporosis, Cachexia   Priority: High     Long-Range Goal: Disease management   Start Date: 02/02/2021  Expected End Date: 08/02/2021  This Visit's Progress: On track  Recent Progress: On track  Priority: High  Note:   Current Barriers:  Unable to independently monitor therapeutic efficacy Unable to self administer medications as prescribed  Pharmacist Clinical Goal(s):  Patient will achieve adherence to monitoring guidelines and medication adherence to achieve therapeutic efficacy achieve ability to self administer medications as prescribed through use of pill packs as evidenced by patient report through collaboration with PharmD and provider.   Interventions: 1:1 collaboration with [name], MD regarding development and update of comprehensive plan of care as evidenced by provider attestation and co-signature Inter-disciplinary care team collaboration (see longitudinal plan of care) Comprehensive medication review performed; medication list updated in electronic medical record  Hyperlipidemia / Cardiovascular risk reduction (LDL goal < 100) Controlled - LDL is at goal; pt endorses compliance, denies issues Current regimen:  Simvastatin 10 mg daily  Aspirin 81 mg daily  Interventions: Discussed cholesterol goals and benefits of medication for prevention of heart attack / stroke Recommend to continue current medication   Depression / Anxiety / Insomnia Controlled Current regimen:  Sertraline 100 mg daily Interventions: Discussed benefits of medications. Pt denies side effects May consider mirtazapine in the future for appetite stimulation Recommend to continue current medication  Cachexia/low BMI (Goal: weight gain) -Uncontrolled - pt reports she is often not able to get food due to financial problems; she eats 1 meal per day, sometimes less. She has tried to get Meals on Wheels before but fails to follow up, and now has lost the phone number; she has significant  memory issues as well which further complicates her self-care -Medications previously tried/failed: dronabinol (not covered since pt does not have chemo-induced N/V or AIDS-related cachexia); mirtazapine -I do not believe medications  to increase appetite would be particularly helpful since pt just does not have access to enough food Plan: refer to Care Guide/social work for help with food insecurity  Osteoporosis Controlled -  pt is taking calcium/vit D as part of her pill packs Current regimen:  Calcium-Vitamin D Interventions: Recommend to continue current medication  Patient Goals/Self-Care Activities Patient will:  - take medications as prescribed -focus on medication adherence by pill packs -Answer phone call from [place] for help with Meals on Wheels      Compliance/Adherence/Medication fill history: Care Gaps: Mammogram (06/23/20) - no showed appt 08/09/21 Colonoscopy (11/05/19)  Star-Rating Drugs: Simvastatin - LF 08/17/21 x 30 ds  Medication Assistance: None required.  Patient affirms current coverage meets needs.  Patient's preferred pharmacy is:  [id number]Phone: [phone]: [phone] [facility] [place] [place]: [phone]: [phone] Uses pill box? No - prefers bottles Pt endorses 100% compliance  We discussed: Reviewed patient's [facility] medication and Epic medication profile assuring there are no discrepancies or gaps in therapy. Confirmed all fill dates appropriate and verified with patient that there is a sufficient quantity of all prescribed medications at home. Informed patient to call me any time if needing medications before scheduled deliveries.  -Pt report she does miss doses sometimes; she has at least 2 weeks of extra pill packs right  now (next delivery is due next week, we will push this delivery out another 2 weeks (10/14/21) so she can get through her current pill packs first)  Patient decided to: Utilize [facility] for medication synchronization, packaging and delivery  Care Plan and Follow Up Patient Decision:  Patient agrees to Care Plan and Follow-up.  Plan: Telephone follow up appointment with care management team member scheduled for:  3 months  LCharlene Brooke PharmD, [place] CPP Clinical Pharmacist [phone]

## 2021-09-13 NOTE — Patient Instructions (Signed)
Visit Information  Phone number for Pharmacist: [phone]   Goals Addressed             This Visit's Progress    Manage My Medicine       Timeframe:  Long-Range Goal Priority:  Medium Start Date:    04/25/21                         Expected End Date:    09/15/22                Follow Up Date Dec 2022   - call for medicine refill 2 or 3 days before it runs out - call if I am sick and can't take my medicine - keep a list of all the medicines I take; vitamins and herbals too -Utilize [facility] pharmacy for medication synchronization, packaging and delivery    Why is this important?   These steps will help you keep on track with your medicines.   Notes:         Care Plan : [place]  Updates made by Charlton Haws, RPH since 09/13/2021 12:00 AM     Problem: Hyperlipidemia, Depression, Anxiety and Osteoporosis, Cachexia   Priority: High     Long-Range Goal: Disease management   Start Date: 02/02/2021  Expected End Date: 08/02/2021  This Visit's Progress: On track  Recent Progress: On track  Priority: High  Note:   Current Barriers:  Unable to independently monitor therapeutic efficacy Unable to self administer medications as prescribed  Pharmacist Clinical Goal(s):  Patient will achieve adherence to monitoring guidelines and medication adherence to achieve therapeutic efficacy achieve ability to self administer medications as prescribed through use of pill packs as evidenced by patient report through collaboration with PharmD and provider.   Interventions: 1:1 collaboration with [name], MD regarding development and update of comprehensive plan of care as evidenced by provider attestation and co-signature Inter-disciplinary care team collaboration (see longitudinal plan of care) Comprehensive medication review performed; medication list updated in electronic medical record  Hyperlipidemia / Cardiovascular risk reduction (LDL goal <  100) Controlled - LDL is at goal; pt endorses compliance, denies issues Current regimen:  Simvastatin 10 mg daily  Aspirin 81 mg daily  Interventions: Discussed cholesterol goals and benefits of medication for prevention of heart attack / stroke Recommend to continue current medication   Depression / Anxiety / Insomnia Controlled Current regimen:  Sertraline 100 mg daily Interventions: Discussed benefits of medications. Pt denies side effects May consider mirtazapine in the future for appetite stimulation Recommend to continue current medication  Cachexia/low BMI (Goal: weight gain) -Uncontrolled - pt reports she is often not able to get food due to financial problems; she eats 1 meal per day, sometimes less. She has tried to get Meals on Wheels before but fails to follow up, and now has lost the phone number; she has significant memory issues as well which further complicates her self-care -Medications previously tried/failed: dronabinol (not covered since pt does not have chemo-induced N/V or AIDS-related cachexia); mirtazapine -I do not believe medications to increase appetite would be particularly helpful since pt just does not have access to enough food Plan: refer to Care Guide/social work for help with food insecurity  Osteoporosis Controlled -  pt is taking calcium/vit D as part of her pill packs Current regimen:  Calcium-Vitamin D Interventions: Recommend to continue current medication  Patient Goals/Self-Care Activities Patient will:  - take  medications as prescribed -focus on medication adherence by pill packs -Answer phone call from [place] for help with Meals on Wheels      The patient verbalized understanding of instructions, educational materials, and care plan provided today and declined offer to receive copy of patient instructions, educational materials, and care plan.  Telephone follow up appointment with pharmacy team member scheduled for: 3  months  Charlene Brooke, PharmD, [place], CPP Clinical Pharmacist [place] [facility] at [facility] [phone]

## 2021-09-14 ENCOUNTER — Telehealth: Payer: Self-pay | Admitting: *Deleted

## 2021-09-14 NOTE — Telephone Encounter (Signed)
   Telephone encounter was:  Unsuccessful.  09/14/2021 Name: SUMIYA MAMARIL MRN: [id number] DOB: 01/20/1951  Unsuccessful outbound call made today to assist with:  Food Insecurity  Outreach Attempt:  1st Attempt  A HIPAA compliant voice message was left requesting a return call.  Instructed patient to call back at   Instructed patient to call back at [phone]  at their earliest convenience. .  [place], Care Management  [phone] [address] , [place] [address] Email : [email or web address]

## 2021-09-15 ENCOUNTER — Telehealth: Payer: Self-pay | Admitting: *Deleted

## 2021-09-15 DIAGNOSIS — F320 Major depressive disorder, single episode, mild: Secondary | ICD-10-CM | POA: Diagnosis not present

## 2021-09-15 DIAGNOSIS — E785 Hyperlipidemia, unspecified: Secondary | ICD-10-CM | POA: Diagnosis not present

## 2021-09-15 DIAGNOSIS — M810 Age-related osteoporosis without current pathological fracture: Secondary | ICD-10-CM

## 2021-09-15 NOTE — Telephone Encounter (Signed)
   Telephone encounter was:  Unsuccessful.  09/15/2021 Name: Michelle [name] MRN: [id number] DOB: [birth year]  Unsuccessful outbound call made today to assist with:  Food Insecurity  Outreach Attempt:  2nd Attempt  A HIPAA compliant voice message was left requesting a return call.  Instructed patient to call back at   Instructed patient to call back at [phone]  at their earliest convenience. . [place], Care Management  [phone] [address] , [place] [address] Email : [email or web address]

## 2021-09-19 ENCOUNTER — Telehealth: Payer: Self-pay | Admitting: Pharmacist

## 2021-09-19 NOTE — Progress Notes (Signed)
    Chronic Care Management Pharmacy Assistant   Name: Michelle [name]  MRN: [id number] DOB: 11-Jul-1951  Made a call to the patient to get in contact with Jeanett Schlein, a care guide that is trying to help her get Meals on Wheels. Left message for patient to return my call.  [place] Pharmacist Assistant [phone]

## 2021-09-20 ENCOUNTER — Telehealth: Payer: Self-pay | Admitting: *Deleted

## 2021-09-29 NOTE — Progress Notes (Signed)
   Made another attempt to reach patient for her to get in contact with [facility] to get for meals on wheels. Left phone number for patient to call [facility]. Unable to reach patient.  [place] Pharmacist Assistant [phone]

## 2021-10-10 ENCOUNTER — Other Ambulatory Visit: Payer: Self-pay | Admitting: Internal Medicine

## 2021-10-10 DIAGNOSIS — E039 Hypothyroidism, unspecified: Secondary | ICD-10-CM

## 2021-10-10 DIAGNOSIS — E785 Hyperlipidemia, unspecified: Secondary | ICD-10-CM

## 2021-10-12 ENCOUNTER — Telehealth: Payer: Self-pay | Admitting: Pharmacist

## 2021-10-12 NOTE — Progress Notes (Addendum)
    Chronic Care Management Pharmacy Assistant   Name: Michelle [name]  MRN: [id number] DOB: [birth year]   Reason for Encounter: Medication Review    Recent office visits:  None ID  Recent consult visits:  None ID  Hospital visits:  None since last coordination call  Medications: Outpatient Encounter Medications as of 10/12/2021  Medication Sig   acetaminophen (TYLENOL) 325 MG tablet Take 2 tablets (650 mg total) by mouth every 6 (six) hours as needed for mild pain (or Fever >/= 101).   albuterol (VENTOLIN HFA) 108 (90 Base) MCG/ACT inhaler Inhale 1-2 puffs into the lungs every 6 (six) hours as needed for wheezing or shortness of breath.   calcium-vitamin D (OSCAL WITH D) 500-200 MG-UNIT tablet Take 1 tablet by mouth daily.   levothyroxine (SYNTHROID) 50 MCG tablet TAKE ONE TABLET BY MOUTH AT NOON   sertraline (ZOLOFT) 100 MG tablet TAKE ONE TABLET BY MOUTH AT NOON   simvastatin (ZOCOR) 10 MG tablet TAKE ONE TABLET BY MOUTH AT NOON   No facility-administered encounter medications on file as of 10/12/2021.    BP Readings from Last 3 Encounters:  07/31/21 118/79  07/18/21 (!) 143/94  06/07/21 126/82    No results found for: HGBA1C    Last adherence delivery date:09/20/21      Patient is due for next adherence delivery on: 10/24/21  Spoke with patient on 10/24/21 reviewed medications and coordinated delivery.  This delivery to include: Adherence Packaging  30 Days  Levothyroxine 50 mcg take 1 tab daily Sertraline 100 mg take 1 tab daily Simvastatin 10 mg take 1 tab daily Ca 500 mg - Vitamin D 1 tab daily  Patient declined the following medications this month:None  Any concerns about your medications? Yes, patient states that she does not really know what some of the medications are for  How often do you forget or accidentally miss a dose?  Patient states that she gets meds in packs but takes whenever she gets up or different times of day  Do you use a pillbox?  No  Is patient in packaging Yes  If yes  What is the date on your next pill pack?Patient can't see the date  Any concerns or issues with your packaging? No   No refill request needed.  Confirmed delivery date of 10/24/21, advised patient that pharmacy will contact them the morning of delivery.  Recent blood pressure readings are as follows:Patient doe not check blood pressure at home   Annual wellness visit in last year? Yes Most Recent BP reading:118/79 on 07/31/21   Ethelene Hal Clinical Pharmacist Assistant [phone]

## 2021-11-21 ENCOUNTER — Telehealth: Payer: Medicare Other

## 2021-12-04 ENCOUNTER — Encounter: Payer: Self-pay | Admitting: Internal Medicine

## 2021-12-04 ENCOUNTER — Other Ambulatory Visit: Payer: Self-pay

## 2021-12-04 ENCOUNTER — Ambulatory Visit (INDEPENDENT_AMBULATORY_CARE_PROVIDER_SITE_OTHER): Payer: Medicare Other | Admitting: Internal Medicine

## 2021-12-04 ENCOUNTER — Ambulatory Visit (INDEPENDENT_AMBULATORY_CARE_PROVIDER_SITE_OTHER): Payer: Medicare Other

## 2021-12-04 VITALS — BP 110/70 | HR 56 | Temp 97.9°F | Ht 60.0 in | Wt 93.0 lb

## 2021-12-04 DIAGNOSIS — J90 Pleural effusion, not elsewhere classified: Secondary | ICD-10-CM | POA: Diagnosis not present

## 2021-12-04 DIAGNOSIS — Y92009 Unspecified place in unspecified non-institutional (private) residence as the place of occurrence of the external cause: Secondary | ICD-10-CM | POA: Diagnosis not present

## 2021-12-04 DIAGNOSIS — X31XXXA Exposure to excessive natural cold, initial encounter: Secondary | ICD-10-CM

## 2021-12-04 DIAGNOSIS — Z720 Tobacco use: Secondary | ICD-10-CM

## 2021-12-04 DIAGNOSIS — Z9189 Other specified personal risk factors, not elsewhere classified: Secondary | ICD-10-CM

## 2021-12-04 DIAGNOSIS — Z5941 Food insecurity: Secondary | ICD-10-CM | POA: Diagnosis not present

## 2021-12-04 DIAGNOSIS — Q828 Other specified congenital malformations of skin: Secondary | ICD-10-CM | POA: Diagnosis not present

## 2021-12-04 DIAGNOSIS — I509 Heart failure, unspecified: Secondary | ICD-10-CM

## 2021-12-04 DIAGNOSIS — I10 Essential (primary) hypertension: Secondary | ICD-10-CM

## 2021-12-04 DIAGNOSIS — R059 Cough, unspecified: Secondary | ICD-10-CM | POA: Diagnosis not present

## 2021-12-04 DIAGNOSIS — R051 Acute cough: Secondary | ICD-10-CM | POA: Diagnosis not present

## 2021-12-04 DIAGNOSIS — J439 Emphysema, unspecified: Secondary | ICD-10-CM | POA: Diagnosis not present
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
## 2021-12-04 NOTE — Patient Instructions (Signed)
You will be contacted regarding the referral for: Social Services - asap  Please continue all other medications as before, and refills have been done if requested.  Please have the pharmacy call with any other refills you may need.  Please continue your efforts at being more active, low cholesterol diet  Please keep your appointments with your specialists as you may have planned  Please go to the XRAY Department in the first floor for the x-ray testing  You will be contacted by phone if any changes need to be made immediately.  Otherwise, you will receive a letter about your results with an explanation, but please check with MyChart first.  Please remember to sign up for MyChart if you have not done so, as this will be important to you in the future with finding out test results, communicating by private email, and scheduling acute appointments online when needed.  Please see Dr Ronnald Ramp in 1 month

## 2021-12-05 ENCOUNTER — Encounter: Payer: Self-pay | Admitting: Internal Medicine

## 2021-12-05 ENCOUNTER — Telehealth: Payer: Self-pay | Admitting: *Deleted

## 2021-12-05 DIAGNOSIS — Z5941 Food insecurity: Secondary | ICD-10-CM | POA: Insufficient documentation

## 2021-12-05 DIAGNOSIS — R051 Acute cough: Secondary | ICD-10-CM | POA: Insufficient documentation

## 2021-12-05 DIAGNOSIS — X31XXXA Exposure to excessive natural cold, initial encounter: Secondary | ICD-10-CM | POA: Insufficient documentation

## 2021-12-05 DIAGNOSIS — Z9189 Other specified personal risk factors, not elsewhere classified: Secondary | ICD-10-CM | POA: Insufficient documentation

## 2021-12-05 NOTE — Assessment & Plan Note (Signed)
BP Readings from Last 3 Encounters:  12/04/21 110/70  07/31/21 118/79  07/18/21 (!) 143/94   Stable, pt to continue medical treatment  - low salt diet

## 2021-12-05 NOTE — Assessment & Plan Note (Signed)
For social services to address

## 2021-12-05 NOTE — Chronic Care Management (AMB) (Signed)
°  Chronic Care Management   Note  12/05/2021 Name: SURABHI GADEA MRN: [id number] DOB: Jan 16, 1951  [name] is a 70 y.o. year old female who is a primary care patient of Ronnald Ramp Arvid [facility], MD. RAFAEL QUESADA is currently enrolled in care management services. An additional referral for Licensed Clinical SW was placed.   Follow up plan: Telephone appointment with care management team member scheduled for: 12/15/2021  Julian Hy, [place] Management  Direct Dial: [phone]
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
## 2021-12-05 NOTE — Assessment & Plan Note (Signed)
Pt counseled to quit, pt not ready 

## 2021-12-05 NOTE — Assessment & Plan Note (Signed)
Exam benign but new, for cxr today

## 2021-12-05 NOTE — Assessment & Plan Note (Signed)
Chronic skin condition essentially no change - no new redness or cellulitis noted today, sister is reassured

## 2021-12-08 ENCOUNTER — Ambulatory Visit: Payer: Medicare Other

## 2021-12-08 NOTE — Progress Notes (Signed)
° °  Chronic Care Management Pharmacy Note  12/08/2021 Name:  Michelle [name] MRN:  [id number] DOB:  [birth year]  Patient was unavailable for appointment during scheduled time, sister had answered phone, reports patient is doing well, has no questions or concerns at this time   Aware to reach out to clinic with any issues or concerns   Tomasa Blase, PharmD Clinical Pharmacist, [place]

## 2021-12-15 ENCOUNTER — Ambulatory Visit: Payer: Medicare Other

## 2021-12-15 ENCOUNTER — Ambulatory Visit (INDEPENDENT_AMBULATORY_CARE_PROVIDER_SITE_OTHER): Payer: Medicare Other | Admitting: *Deleted

## 2021-12-15 DIAGNOSIS — F320 Major depressive disorder, single episode, mild: Secondary | ICD-10-CM

## 2021-12-15 DIAGNOSIS — Z5941 Food insecurity: Secondary | ICD-10-CM

## 2021-12-15 DIAGNOSIS — I10 Essential (primary) hypertension: Secondary | ICD-10-CM

## 2021-12-15 DIAGNOSIS — E785 Hyperlipidemia, unspecified: Secondary | ICD-10-CM

## 2021-12-15 DIAGNOSIS — X31XXXA Exposure to excessive natural cold, initial encounter: Secondary | ICD-10-CM

## 2021-12-15 DIAGNOSIS — M810 Age-related osteoporosis without current pathological fracture: Secondary | ICD-10-CM

## 2021-12-15 DIAGNOSIS — F419 Anxiety disorder, unspecified: Secondary | ICD-10-CM

## 2021-12-15 DIAGNOSIS — R64 Cachexia: Secondary | ICD-10-CM
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
## 2021-12-15 NOTE — Chronic Care Management (AMB) (Signed)
Chronic Care Management   CCM RN Visit Note  12/15/2021 Name: Michelle [name] MRN: [id number] DOB: 27-Aug-1951  Subjective: Michelle [name] is a 70 y.o. year old female who is a primary care patient of [name], MD. The care management team was consulted for assistance with disease management and care coordination needs.    Collaboration with Education officer, museum  for  collaboration regarding safety needs in patient with chronic disease care needs  in response to provider referral for case management and/or care coordination services.   Consent to Services:  The patient was given information about Chronic Care Management services, agreed to services, and gave verbal consent prior to initiation of services.  Please see initial visit note for detailed documentation.   Patient agreed to services and verbal consent obtained.   Assessment: Review of patient past medical history, allergies, medications, health status, including review of consultants reports, laboratory and other test data, was performed as part of comprehensive evaluation and provision of chronic care management services.   SDOH (Social Determinants of Health) assessments and interventions performed:    CCM Care Plan  Allergies  Allergen Reactions   Aricept [Donepezil] Other (See Comments)    bradycardia   Penicillins Rash    Has patient had a PCN reaction causing immediate rash, facial/tongue/throat swelling, SOB or lightheadedness with hypotension: Yes Has patient had a PCN reaction causing severe rash involving mucus membranes or skin necrosis: No Has patient had a PCN reaction that required hospitalization: No Has patient had a PCN reaction occurring within the last 10 years: No If all of the above answers are "NO", then may proceed with Cephalosporin use.     Outpatient Encounter Medications as of 12/15/2021  Medication Sig   acetaminophen (TYLENOL) 325 MG tablet Take 2 tablets (650 mg total) by mouth every 6  (six) hours as needed for mild pain (or Fever >/= 101).   albuterol (VENTOLIN HFA) 108 (90 Base) MCG/ACT inhaler Inhale 1-2 puffs into the lungs every 6 (six) hours as needed for wheezing or shortness of breath.   calcium-vitamin D (OSCAL WITH D) 500-200 MG-UNIT tablet Take 1 tablet by mouth daily.   levothyroxine (SYNTHROID) 50 MCG tablet TAKE ONE TABLET BY MOUTH AT NOON   sertraline (ZOLOFT) 100 MG tablet TAKE ONE TABLET BY MOUTH AT NOON   simvastatin (ZOCOR) 10 MG tablet TAKE ONE TABLET BY MOUTH AT NOON   No facility-administered encounter medications on file as of 12/15/2021.    Patient Active Problem List   Diagnosis Date Noted   Acute cough 12/05/2021   Food insecurity 12/05/2021   Exposure to excessive natural cold as cause of accidental injury at home as place of occurrence 12/05/2021   Other specified personal risk factors, not elsewhere classified 12/05/2021   Pneumonia of right lower lobe due to infectious organism 06/09/2021   Pulmonary edema 05/07/2021   Weight loss, abnormal 03/28/2021   Encounter for general adult medical examination with abnormal findings 03/28/2021   Manifestations of thiamine deficiency 11/04/2020   Hypomagnesemia 11/01/2020   Tobacco abuse 11/01/2020   Cachexia ([place]) 11/01/2020   Vertigo 10/02/2020   Irritable bowel syndrome with both constipation and diarrhea 08/15/2020   Stage 3b chronic kidney disease ([place]) 01/14/2020   Tetralogy of Fallot s/p repair 12/04/2019   RBBB 12/04/2019   Current mild episode of major depressive disorder without prior episode ([place]) 10/28/2017   S/P surgical pulmonary valve replacement 03/05/2017   S/P TVR (tricuspid valve repair) 03/05/2017  Status post atrial septal defect repair 01/25/2017   Moderate tricuspid regurgitation 01/01/2017   Acquired hypothyroidism 11/08/2016   TOF (tetralogy of Fallot) 09/27/2016   Visit for screening mammogram 07/30/2016   Osteoporosis 07/30/2016   Mononeuropathy of right lower  extremity 11/30/2014   Health care maintenance 11/10/2014   Congenital heart disease in adult 09/28/2014   Essential hypertension 09/17/2014   Anxiety state 09/23/2012   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   Dyslipidemia 03/19/2007    Conditions to be addressed/monitored: Critical Safety Needs in patient with multiple chronic disease states including Dyslipidemia, Chronic Anxiety, Depression, and HTN  Care Plan : RNCM Plan of Care  Updates made by Clerance Lav, RN since 12/15/2021 12:00 AM     Problem: Safety Concerns in a patient with chronic health conditions (Dyslipidemia, Chronic Anxiety, Depression, HTN)   Priority: High     Long-Range Goal: Establish Plan for Safe Care Environment and Appropriate Care for Chronic Disease States   Priority: High  Note:   Current Barriers:  Care Coordination needs related to Limited social support and Level of care concerns  Chronic Disease Management support and education needs related to CHF, HTN, HLD, and Anxiety with chronic symptoms and Depression: anxiety Lacks caregiver support.         RNCM Clinical Goal(s):  Patient will continue to work with RN Care Manager and/or Social Worker to address care management and care coordination needs related to safety and chronic disease state management as evidenced by adherence to CM Team Scheduled appointments     through collaboration with Consulting civil engineer, provider, and care team.   Interventions: 1:1 collaboration with primary care provider regarding development and update of comprehensive plan of care as evidenced by provider attestation and co-signature Inter-disciplinary care team collaboration (see longitudinal plan of care) Evaluation of current treatment plan related to  self management and patient's adherence to plan as established by provider   Safety Needs in Patient with Chronic Health Conditions  (Status: New goal.) Long Term Goal  Evaluation of current treatment plan related to  safety  and appropriate care for patient with chronic disease care needs , Limited social support and Level of care concerns self-management and patient's adherence to plan as established by provider. Discussed plans with patient for ongoing care management follow up and provided patient with direct contact information for care management team Collaborated with social worker regarding critical safety needs precluding ongoing work to address chronic disease management needs;  Patient Goals/Self-Care Activities: Work with the Education officer, museum to address care coordination needs and will continue to work with the clinical team to address health care and disease management related needs       Plan:The care management team will reach out to the patient again over the next 3 days. [place] Coordination   High Risk Managed Medicaid Direct Dial:  [phone]   Fax: [phone]

## 2021-12-15 NOTE — Patient Instructions (Signed)
Visit Information  Thank you for taking time to visit with me today. Please don't hesitate to contact me if I can be of assistance to you before our next scheduled telephone appointment.  Following are the goals we discussed today:   Patient Goals/Self-Care Activities patient will: with the help of her sister  - Attend upcoming appointment to assess changes in cognition -Follow up with APS regarding the status of current case -Engage with [employer] [place] to receive Meals on [employer] -Explore opportunity to file for Guardianship   Please call the care guide team at [phone] if you need to cancel or reschedule your appointment.   If you are experiencing a Mental Health or [place] or need someone to talk to, please call the Suicide and Crisis Lifeline: 988   The patient verbalized understanding of instructions, educational materials, and care plan provided today and declined offer to receive copy of patient instructions, educational materials, and care plan.   Follow up plan: The scheduling care guide will contact Gwynne Edinger to schedule a follow up call over the next two weeks.  Daneen Schick, BSW, CDP Social Worker, Certified Dementia Practitioner [place] [facility] [place] [facility] [phone]

## 2021-12-15 NOTE — Patient Instructions (Signed)
Visit Information   Thank you for taking time to visit with me today. Please don't hesitate to contact me if I can be of assistance to you before our next scheduled telephone appointment.  Following are the goals we discussed today:  (Copy and paste patient goals from clinical care plan here)  Our next appointment is  pending social work visit and follow up appointment needs.     Please call the care guide team at [phone] if you need to cancel or reschedule your appointment.   If you are experiencing a Mental Health or [place] or need someone to talk to, please call the Suicide and Crisis Lifeline: 40   Following is a copy of your full care plan:  Care Plan : RNCM Plan of Care  Updates made by Clerance Lav, RN since 12/15/2021 12:00 AM     Problem: Safety Concerns in a patient with chronic health conditions (Dyslipidemia, Chronic Anxiety, Depression, HTN)   Priority: High     Long-Range Goal: Establish Plan for [name] and Appropriate Care for Chronic Disease States   Priority: High  Note:   Current Barriers:  Care Coordination needs related to Limited social support and Level of care concerns  Chronic Disease Management support and education needs related to CHF, HTN, HLD, and Anxiety with chronic symptoms and Depression: anxiety Lacks caregiver support.         RNCM Clinical Goal(s):  Patient will continue to work with RN Care Manager and/or Social Worker to address care management and care coordination needs related to safety and chronic disease state management as evidenced by adherence to CM Team Scheduled appointments     through collaboration with Consulting civil engineer, provider, and care team.   Interventions: 1:1 collaboration with primary care provider regarding development and update of comprehensive plan of care as evidenced by provider attestation and co-signature Inter-disciplinary care team collaboration (see longitudinal plan of  care) Evaluation of current treatment plan related to  self management and patient's adherence to plan as established by provider   Safety Needs in Patient with Chronic Health Conditions  (Status: New goal.) Long Term Goal  Evaluation of current treatment plan related to  safety and appropriate care for patient with chronic disease care needs , Limited social support and Level of care concerns self-management and patient's adherence to plan as established by provider. Discussed plans with patient for ongoing care management follow up and provided patient with direct contact information for care management team Collaborated with social worker regarding critical safety needs precluding ongoing work to address chronic disease management needs;  Patient Goals/Self-Care Activities: Work with the Education officer, museum to address care coordination needs and will continue to work with the clinical team to address health care and disease management related needs       Consent to CCM Services: Michelle [name] was given information about Chronic Care Management services including:  CCM service includes personalized support from designated clinical staff supervised by her physician, including individualized plan of care and coordination with other care providers 24/7 contact phone numbers for assistance for urgent and routine care needs. Service will only be billed when office clinical staff spend 20 minutes or more in a month to coordinate care. Only one practitioner may furnish and bill the service in a calendar month. The patient may stop CCM services at any time (effective at the end of the month) by phone call to the office staff. The patient will be responsible for  cost sharing (co-pay) of up to 20% of the service fee (after annual deductible is met).  Patient agreed to services and verbal consent obtained.   Patient will collaborate with SW and plan of care will be shared at that time Patient active on  MyChart and can view plan of care prior to SW engagement.   The care management team will reach out to the patient again over the next 3 days.   [place] Coordination   High Risk Managed Medicaid Direct Dial:  [phone]   Fax: [phone]

## 2021-12-16 DIAGNOSIS — E785 Hyperlipidemia, unspecified: Secondary | ICD-10-CM

## 2021-12-16 DIAGNOSIS — M810 Age-related osteoporosis without current pathological fracture: Secondary | ICD-10-CM

## 2021-12-16 DIAGNOSIS — F320 Major depressive disorder, single episode, mild: Secondary | ICD-10-CM

## 2021-12-16 DIAGNOSIS — I10 Essential (primary) hypertension: Secondary | ICD-10-CM

## 2022-01-02 ENCOUNTER — Ambulatory Visit: Payer: Medicare Other | Admitting: Licensed Clinical Social Worker

## 2022-01-02 DIAGNOSIS — F411 Generalized anxiety disorder: Secondary | ICD-10-CM

## 2022-01-02 DIAGNOSIS — M810 Age-related osteoporosis without current pathological fracture: Secondary | ICD-10-CM

## 2022-01-02 NOTE — Chronic Care Management (AMB) (Signed)
Chronic Care Management    Clinical Social Work Note  01/02/2022 Name: Michelle [name] MRN: [id number] DOB: 1951/04/26  Michelle [name] is a 71 y.o. year old female who is a primary care patient of Michelle Lima, MD. The CCM team was consulted to assist the patient with chronic disease management and/or care coordination needs related to: Transportation Needs , Food Insecurity, Level of Care Concerns, and safety .   Engaged with patient by telephone for follow up visit in response to provider referral for social work chronic care management and care coordination services.   Consent to Services:  The patient was given information about Chronic Care Management services, agreed to services, and gave verbal consent prior to initiation of services.  Please see initial visit note for detailed documentation.   Patient agreed to services and consent obtained.   Summary: Assessed patient's current treatment, progress, coping skills, support system and barriers to care.  Patient was accompanied by her sister [name] who provided information during this encounter. She is making progress with and is now receiving meals on wheels.  Adult Protective Services Case continues to be active, sister has spoken with the social worker but unable to provide details during this encounter due to the person being investigated was in close proximity. Main concern in no central heat and patient using space heaters. Per patient and sister she is able to meet ADL's .  See Care Plan below for interventions and patient self-care actives.  Recommendation: Patient may benefit from, and is in agreement to allow sister to set up Medicaid Transportation via DSS, and allow LCSW to make referral to aging gracefully and home repair program.   Follow up Plan: Patient would like continued follow-up from CCM LCSW.  per patient's request will follow up in 30 days.  Will call office if needed prior to next encounter.    Assessment:  Review of patient past medical history, allergies, medications, and health status, including review of relevant consultants reports was performed today as part of a comprehensive evaluation and provision of chronic care management and care coordination services.     SDOH (Social Determinants of Health) assessments and interventions performed:  SDOH Interventions    Flowsheet Row Most Recent Value  SDOH Interventions   Housing Interventions [id number] Referral  [owns her home needs assistance with repair]  Transportation Interventions Other (Comment)  [Sister will assist with calling DSS for Medicaid transportation]        Advanced Directives Status: Not addressed in this encounter.  CCM Care Plan   Conditions to be addressed/monitored: Depression; Transportation and Memory Deficits  Care Plan : Social Work Plan of Care  Updates made by [name], LCSW since 01/02/2022 12:00 AM     Problem: Home and Family Safety (Wellness)      Goal: Home and Family Safety Maintained   Start Date: 12/15/2021  Priority: High  Note:   Current Barriers:  Chronic disease management support and education needs related to  HLD, Osteoporosis, Anxiety/Depression   Limited access to food Critical Safety Needs ; concerns with memory  CSW Clinical Goal(s):  Patient  and Caregiver  explore community resource options discussed during encounter for unmet needs related to:  Therapist, art through collaboration with [place] representative, provider, and care team.   Interventions: 1:1 collaboration with primary care provider regarding development and update of comprehensive plan of care as evidenced by provider attestation and co-signature Inter-disciplinary care team collaboration (see longitudinal plan of  care) Evaluation of current treatment plan related to  self management and patient's adherence to plan as established by provider Review resources, discussed options and provided  patient information about  Department of Social Services ( has Active APS case; discussed Agricultural engineer  ) [employer] (on hold for now) Publishing rights manager (PCS) Meals on wheels (started receiving first meal today)  Social Determinants of Health in Patient with HLD, Osteoporosis, and memory difficulty  :  (Status: Goal on Track (progressing): YES.) SDOH assessments completed: Housing , Transportation, Landscape architect , and None Evaluation of current treatment plan related to unmet needs  Current level of care: home with other family or significant other(s): significant other and family member: sister [name] Evaluation of patient safety in current living environment ( Active APS case) Solution-Focused Strategies employed:  Problem [place] strategies reviewed Discussed Guardianship and reviewed process  Made referral to Aging Gracefully program to address heating concerns   Patient Self-Care Activities: Call Department of Social Services [phone] to get set up for [employer]  I have place a referral with [place] team to discuss the Aging Gracefully program for heating  resource options, they will contact you. Congratulation on getting Meals on Wheels Guardianship process     Casimer Lanius, [place] Licensed Clinical Social Worker Warehouse manager [place]  [phone]

## 2022-01-03 NOTE — Patient Instructions (Addendum)
Visit Information  Thank you for taking time to visit with me today. Please don't hesitate to contact me if I can be of assistance to you before our next scheduled telephone appointment.  Following are the goals we discussed today: Community Support Options Patient Self-Care Activities: Call Department of Social Services [phone] to get set up for [employer]  I have place a referral with [place] team to discuss the Aging Gracefully program for heating  resource options, they will contact you. Congratulation on getting Meals on Wheels Guardianship process  Our next appointment is by telephone on Feb. 20th  at 11:00  Please call the care guide team at [phone] if you need to cancel or reschedule your appointment.   If you are experiencing a Mental Health or [place] or need someone to talk to, please call the [place] National Suicide Prevention Lifeline: (416) 254-4952 or TTY: 4381560717 TTY 712-704-1951) to talk to a trained counselor call 1-800-273-TALK (toll free, 24 hour hotline) go to [facility] [address], [place] [phone]) call 911   Patient verbalizes understanding of instructions and care plan provided today and agrees to view in [place].   Per sister she will Active MyChart status to review information.     Casimer Lanius, LCSW Licensed Clinical Social Worker [facility]  [place]  [phone]

## 2022-01-08 ENCOUNTER — Ambulatory Visit (INDEPENDENT_AMBULATORY_CARE_PROVIDER_SITE_OTHER): Payer: Medicare Other | Admitting: Internal Medicine

## 2022-01-08 ENCOUNTER — Other Ambulatory Visit: Payer: Self-pay

## 2022-01-08 ENCOUNTER — Encounter: Payer: Self-pay | Admitting: Internal Medicine

## 2022-01-08 ENCOUNTER — Telehealth: Payer: Self-pay

## 2022-01-08 VITALS — BP 110/62 | HR 66 | Temp 98.3°F | Ht 60.0 in | Wt 97.0 lb

## 2022-01-08 DIAGNOSIS — E519 Thiamine deficiency, unspecified: Secondary | ICD-10-CM | POA: Diagnosis not present

## 2022-01-08 DIAGNOSIS — Z5941 Food insecurity: Secondary | ICD-10-CM

## 2022-01-08 DIAGNOSIS — N1832 Chronic kidney disease, stage 3b: Secondary | ICD-10-CM | POA: Diagnosis not present

## 2022-01-08 DIAGNOSIS — E039 Hypothyroidism, unspecified: Secondary | ICD-10-CM | POA: Diagnosis not present

## 2022-01-08 DIAGNOSIS — Z23 Encounter for immunization: Secondary | ICD-10-CM

## 2022-01-08 LAB — CBC WITH DIFFERENTIAL/PLATELET
Basophils Absolute: 0.1 10*3/uL (ref 0.0–0.1)
Basophils Relative: 0.9 % (ref 0.0–3.0)
Eosinophils Absolute: 0.1 10*3/uL (ref 0.0–0.7)
Eosinophils Relative: 2.1 % (ref 0.0–5.0)
HCT: 39.1 % (ref 36.0–46.0)
Hemoglobin: 12.6 g/dL (ref 12.0–15.0)
Lymphocytes Relative: 21.8 % (ref 12.0–46.0)
Lymphs Abs: 1.4 10*3/uL (ref 0.7–4.0)
MCHC: 32.2 g/dL (ref 30.0–36.0)
MCV: 89.3 fl (ref 78.0–100.0)
Monocytes Absolute: 0.6 10*3/uL (ref 0.1–1.0)
Monocytes Relative: 9 % (ref 3.0–12.0)
Neutro Abs: 4.1 10*3/uL (ref 1.4–7.7)
Neutrophils Relative %: 66.2 % (ref 43.0–77.0)
Platelets: 229 10*3/uL (ref 150.0–400.0)
RBC: 4.38 Mil/uL (ref 3.87–5.11)
RDW: 14.5 % (ref 11.5–15.5)
WBC: 6.2 10*3/uL (ref 4.0–10.5)

## 2022-01-08 LAB — BASIC METABOLIC PANEL
BUN: 16 mg/dL (ref 6–23)
CO2: 32 mEq/L (ref 19–32)
Calcium: 8.7 mg/dL (ref 8.4–10.5)
Chloride: 102 mEq/L (ref 96–112)
Creatinine, Ser: 1.15 mg/dL (ref 0.40–1.20)
GFR: 47.8 mL/min — ABNORMAL LOW (ref >60.00)
Glucose, Bld: 46 mg/dL — CL (ref 70–99)
Potassium: 4 mEq/L (ref 3.5–5.1)
Sodium: 141 mEq/L (ref 135–145)

## 2022-01-08 LAB — MAGNESIUM: Magnesium: 2.1 mg/dL (ref 1.5–2.5)

## 2022-01-08 LAB — TSH: TSH: 1.8 u[IU]/mL (ref 0.35–5.50)

## 2022-01-08 NOTE — Progress Notes (Signed)
Subjective:  Patient ID: Michelle [name], female    DOB: 1951-02-22  Age: 71 y.o. MRN: [id number]  CC: Hypothyroidism  This visit occurred during the SARS-CoV-2 public health emergency.  Safety protocols were in place, including screening questions prior to the visit, additional usage of staff PPE, and extensive cleaning of exam room while observing appropriate contact time as indicated for disinfecting solutions.    HPI Michelle [name] presents for f/up -   She is with her sister today.  Her sister has become power of attorney and is trying to manage her finances.  Her sister has arranged for her to have Meals on Wheels.  Her weight has improved slightly but her memory continues to decline.  Outpatient Medications Prior to Visit  Medication Sig Dispense Refill   acetaminophen (TYLENOL) 325 MG tablet Take 2 tablets (650 mg total) by mouth every 6 (six) hours as needed for mild pain (or Fever >/= 101). 20 tablet 0   albuterol (VENTOLIN HFA) 108 (90 Base) MCG/ACT inhaler Inhale 1-2 puffs into the lungs every 6 (six) hours as needed for wheezing or shortness of breath. 6.7 g 0   calcium-vitamin D (OSCAL WITH D) 500-200 MG-UNIT tablet Take 1 tablet by mouth daily.     levothyroxine (SYNTHROID) 50 MCG tablet TAKE ONE TABLET BY MOUTH AT NOON 90 tablet 1   sertraline (ZOLOFT) 100 MG tablet TAKE ONE TABLET BY MOUTH AT NOON 90 tablet 1   simvastatin (ZOCOR) 10 MG tablet TAKE ONE TABLET BY MOUTH AT NOON 90 tablet 1   No facility-administered medications prior to visit.    ROS Review of Systems  Constitutional:  Positive for fatigue. Negative for chills, diaphoresis and unexpected weight change.  HENT: Negative.    Eyes: Negative.   Respiratory:  Negative for cough, chest tightness, shortness of breath and wheezing.   Cardiovascular:  Negative for chest pain, palpitations and leg swelling.  Gastrointestinal:  Negative for abdominal pain, constipation, diarrhea and vomiting.  Endocrine:  Negative for cold intolerance and heat intolerance.  Genitourinary: Negative.  Negative for difficulty urinating and hematuria.  Musculoskeletal: Negative.   Skin: Negative.   Neurological:  Positive for weakness. Negative for dizziness, speech difficulty and light-headedness.  Hematological:  Negative for adenopathy. Does not bruise/bleed easily.   Objective:  BP 110/62 (BP Location: Left Arm, Patient Position: Sitting, Cuff Size: Normal)    Pulse 66    Temp 98.3 F (36.8 C) (Oral)    Ht 5' (1.524 m)    Wt 97 lb (44 kg)    SpO2 93%    BMI 18.94 kg/m   BP Readings from Last 3 Encounters:  01/08/22 110/62  12/04/21 110/70  07/31/21 118/79    Wt Readings from Last 3 Encounters:  01/08/22 97 lb (44 kg)  12/04/21 93 lb (42.2 kg)  07/18/21 86 lb (39 kg)    Physical Exam Vitals reviewed.  Constitutional:      Appearance: She is underweight. She is ill-appearing.  HENT:     Nose: Nose normal.     Mouth/Throat:     Mouth: Mucous membranes are moist.  Eyes:     General: No scleral icterus.    Conjunctiva/sclera: Conjunctivae normal.  Cardiovascular:     Rate and Rhythm: Regular rhythm.     Heart sounds: No murmur heard. Pulmonary:     Breath sounds: No stridor. No wheezing, rhonchi or rales.  Abdominal:     General: Abdomen is flat.  Palpations: There is no mass.     Tenderness: There is no abdominal tenderness. There is no guarding or rebound.     Hernia: No hernia is present.  Musculoskeletal:     Cervical back: Neck supple.  Lymphadenopathy:     Cervical: No cervical adenopathy.  Skin:    General: Skin is warm.     Findings: No lesion or rash.  Neurological:     General: No focal deficit present.     Mental Status: She is alert.  Psychiatric:        Mood and Affect: Mood normal.        Behavior: Behavior normal.    Lab Results  Component Value Date   WBC 6.2 01/08/2022   HGB 12.6 01/08/2022   HCT 39.1 01/08/2022   PLT 229.0 01/08/2022   GLUCOSE 46 (LL)  01/08/2022   CHOL 134 03/28/2021   TRIG 106.0 03/28/2021   HDL 40.80 03/28/2021   LDLDIRECT 113 (H) 06/16/2008   LDLCALC 72 03/28/2021   ALT 10 07/18/2021   AST 21 07/18/2021   NA 141 01/08/2022   K 4.0 01/08/2022   CL 102 01/08/2022   CREATININE 1.15 01/08/2022   BUN 16 01/08/2022   CO2 32 01/08/2022   TSH 1.80 01/08/2022   INR 1.3 (H) 09/22/2020    DG Chest 2 View  Result Date: 07/30/2021 CLINICAL DATA:  Chest pain for several hours EXAM: CHEST - 2 VIEW COMPARISON:  07/18/2021 FINDINGS: Cardiac shadow is stable. Postsurgical changes are seen. The lungs are clear bilaterally. Bilateral nipple shadows are noted. No acute bony abnormality is seen. IMPRESSION: No acute abnormality noted. Electronically Signed   By: Inez Catalina M.D.   On: 07/30/2021 20:47    Assessment & Plan:   Bora was seen today for hypothyroidism.  Diagnoses and all orders for this visit:  Acquired hypothyroidism- Her TSH is in the normal range.  She will stay on the current dose of levothyroxine. -     TSH; Future -     TSH  Stage 3b chronic kidney disease ([place])- Her renal function is stable. -     Basic metabolic panel; Future -     CBC with Differential/Platelet; Future -     CBC with Differential/Platelet -     Basic metabolic panel  Manifestations of thiamine deficiency- I will monitor her thiamine level. -     Vitamin B1; Future -     Vitamin B1  Hypomagnesemia- Her magnesium level is normal now. -     Basic metabolic panel; Future -     Magnesium; Future -     Magnesium -     Basic metabolic panel  Food insecurity- Her weight has improved and she will start receiving Meals on Wheels.  Other orders -     Pneumococcal polysaccharide vaccine 23-valent greater than or equal to 2yo subcutaneous/IM   I am having Michelle [name] maintain her acetaminophen, albuterol, sertraline, calcium-vitamin D, levothyroxine, and simvastatin.  No orders of the defined types were placed in this  encounter.    Follow-up: Return in about 6 months (around 07/08/2022).  Michelle Calico, MD

## 2022-01-08 NOTE — Patient Instructions (Signed)

## 2022-01-08 NOTE — Telephone Encounter (Signed)
CRITICAL VALUE STICKER  CRITICAL VALUE: Glucose 46  RECEIVER (on-site recipient of call): Lovena Le A. Alroy Dust, CMA  DATE & TIME NOTIFIED: 01/08/22 at 4:43p  MESSENGER (representative from lab): Hope with lab  MD NOTIFIED: yes  TIME OF NOTIFICATION: 4:44p  RESPONSE: waiting for response

## 2022-01-10 ENCOUNTER — Telehealth: Payer: Self-pay | Admitting: Internal Medicine

## 2022-01-10 NOTE — Telephone Encounter (Signed)
Pt's sister [name] requesting a c/b to discuss the process on how to get pt into assistant living   Phone [phone]

## 2022-01-10 NOTE — Telephone Encounter (Signed)
I have informed pt's sister that she would have to research facilities and make a decision on where she would like the pt to be placed. Then the facility would have a discussion with the family in regard of the financial/insurance aspect. Once a final decision is made the facility typically sends PCP forms to fill out for pt to be placed. Pt's sister stated that she would give the office a call once she has made a decision.

## 2022-01-12 LAB — VITAMIN B1: Vitamin B1 (Thiamine): 24 nmol/L (ref 8–30)

## 2022-01-15 ENCOUNTER — Ambulatory Visit: Payer: Medicaid Other | Admitting: Licensed Clinical Social Worker

## 2022-01-15 DIAGNOSIS — Z636 Dependent relative needing care at home: Secondary | ICD-10-CM

## 2022-01-15 DIAGNOSIS — Z789 Other specified health status: Secondary | ICD-10-CM

## 2022-01-15 NOTE — Chronic Care Management (AMB) (Signed)
Chronic Care Management   Clinical Social Work Note  01/15/2022 Name: Michelle [name] MRN: [id number] DOB: 07/31/1951  Michelle [name] is a 71 y.o. year old female who is a primary care patient of [name], MD. The CCM team was consulted to assist the patient with chronic disease management and/or care coordination needs related to: [employer]  and Level of Care Concerns.   Collaboration with patient's sister  for follow up visit in response to provider referral for social work chronic care management and care coordination services.   Consent to Services:  The patient was given information about Chronic Care Management services, agreed to services, and gave verbal consent prior to initiation of services.  Please see initial visit note for detailed documentation.  Patient agreed to services and consent obtained.   Summary: Patient's sister [name] provided all information during this encounter. Per sister, patient continues to experience difficulty with her memory, which is creating difficulty with managing her care.  Sister reports discussing concern with PCP during last office visit. She is making progress with and has received the Aging Gracefully application to assist with home repair and heating unit. Per sister APS case remains active with the Department of Social Service .  See Care Plan below for interventions and patient self-care actives.  Follow up Plan: Patient's caregiver would like continued follow-up from CCM LCSW .  per caregiver's request CCM LCSW will follow up in 3 weeks.  They will call the office if needed prior to next encounter.    Assessment: Review of patient past medical history, allergies, medications, and health status, including review of relevant consultants reports was performed today as part of a comprehensive evaluation and provision of chronic care management and care coordination services.     SDOH (Social Determinants of Health) assessments and  interventions performed:    Advanced Directives Status: Not addressed in this encounter.  CCM Care Plan  Conditions to be addressed/monitored: ; Memory Deficits  Care Plan : Social Work Plan of Care  Updates made by [name], LCSW since 01/15/2022 12:00 AM     Problem: Home and Family Safety (Wellness)      Goal: Home and Family Safety Maintained   Start Date: 12/15/2021  This Visit's Progress: On track  Priority: High  Note:   Current Barriers:  Chronic disease management support and education needs related to  HLD, Osteoporosis, Anxiety/Depression   Limited access to food Critical Safety Needs ; concerns with memory  CSW Clinical Goal(s):  Patient  and Caregiver  explore community resource options discussed during encounter for unmet needs related to:  Therapist, art through collaboration with [employer] representative, provider, and care team.   Interventions: 1:1 collaboration with primary care provider regarding development and update of comprehensive plan of care as evidenced by provider attestation and co-signature Inter-disciplinary care team collaboration (see longitudinal plan of care) Evaluation of current treatment plan related to  self management and patient's adherence to plan as established by provider Review resources, discussed options and provided patient information about  Department of Social Services ( has Active APS case; discussed Agricultural engineer  ) [employer] (on hold for now per sister) Publishing rights manager (PCS) Meals on wheels (started receiving first meal today)  Social Determinants of Health in Patient with HLD, Osteoporosis, and memory difficulty  :  (Status: Goal on Track (progressing): YES.) SDOH assessments completed: Housing , Transportation, Food Insecurity , and None Evaluation of current treatment plan  related to unmet needs  Current level of care: home with other family or significant other(s): significant  other and family member: sister [name] Evaluation of patient safety in current living environment ( Active APS case) Solution-Focused Strategies employed:  Problem [place] strategies reviewed Discussed Guardianship and reviewed process  Made referral to Aging Gracefully program to address heating concerns   Patient Self-Care Activities: Call Department of Social Services [phone] to get set up for [employer]      Casimer Lanius, [place] Licensed Clinical Social Worker Warehouse manager Primary Care [place]  [phone]

## 2022-01-15 NOTE — Patient Instructions (Addendum)
Visit Information  Thank you for taking time to visit with me today. Please don't hesitate to contact me if I can be of assistance to you before our next scheduled telephone appointment.  Following are the goals we discussed today:  Patient Self-Care Activities: Call Department of Social Services [phone] to get set up for Medicaid Transportation  Continue to work on the Aging Gracefully application   Our next appointment is by telephone on Feb 20th at 11:00  Please call the care guide team at [phone] if you need to cancel or reschedule your appointment.   If you are experiencing a Mental Health or [place] or need someone to talk to, please call the [place] National Suicide Prevention Lifeline: 8625424160 or TTY: 845-843-8261 TTY [phone]) to talk to a trained counselor call 1-800-273-TALK (toll free, 24 hour hotline) go to [facility] [address], [place] [phone]) call 911   Per sister will assist with accessing [place], [place] Licensed Clinical Social Worker [employer] Arbour Management  [place]  [phone]

## 2022-01-18 ENCOUNTER — Telehealth: Payer: Self-pay | Admitting: Internal Medicine

## 2022-01-18 NOTE — Telephone Encounter (Signed)
Michelle [name] called in regards to PT. She works with Fish farm manager and would like to know if there has been a referral put into [facility] for PT, and if she has been diagnosed with dementia?  CB for Michelle [name] is: [phone]

## 2022-01-18 NOTE — Telephone Encounter (Signed)
I have spoken to Cricket her that a [facility] PT referral has not been place. She would like to know if PCP is willing to enter the referral for the pt. Please advise.   I will follow up with [place] once I receive a response.

## 2022-01-19 ENCOUNTER — Other Ambulatory Visit: Payer: Self-pay | Admitting: Internal Medicine

## 2022-01-19 DIAGNOSIS — G5791 Unspecified mononeuropathy of right lower limb: Secondary | ICD-10-CM

## 2022-01-19 NOTE — Telephone Encounter (Signed)
LVM for Michelle [name] stating that referral was entered

## 2022-01-31 ENCOUNTER — Ambulatory Visit: Payer: Medicaid Other

## 2022-02-05 ENCOUNTER — Ambulatory Visit (INDEPENDENT_AMBULATORY_CARE_PROVIDER_SITE_OTHER): Payer: Medicaid Other | Admitting: Licensed Clinical Social Worker

## 2022-02-05 DIAGNOSIS — F411 Generalized anxiety disorder: Secondary | ICD-10-CM

## 2022-02-05 DIAGNOSIS — Z5941 Food insecurity: Secondary | ICD-10-CM

## 2022-02-05 DIAGNOSIS — I10 Essential (primary) hypertension: Secondary | ICD-10-CM

## 2022-02-05 NOTE — Patient Instructions (Signed)
Visit Information  Thank you for taking time to visit with me today. Please don't hesitate to contact me if I can be of assistance to you before our next scheduled telephone appointment.  Following are the goals we discussed today: caregiver support Patient Self-Care Activities: Call [place] [phone] to apply for foodstamps Call insurance provider to follow up on enhanced benefits  Our next appointment is by telephone on April 11th at 13:30  Please call the care guide team at [phone] if you need to cancel or reschedule your appointment.   If you are experiencing a Mental Health or [place] or need someone to talk to, please call the [place] National Suicide Prevention Lifeline: (256) 050-5487 or TTY: 2132504322 TTY (208)600-4266) to talk to a trained counselor call 1-800-273-TALK (toll free, 24 hour hotline) go to [facility] [address] [place] [phone])   Patient verbalizes understanding of instructions and care plan provided today and agrees to view in [place]. Active MyChart status confirmed with patient.   And POA   Casimer Lanius, LCSW Licensed Clinical Social Worker [employer] Management  [place]  [phone]

## 2022-02-05 NOTE — Chronic Care Management (AMB) (Signed)
Chronic Care Management   Clinical Social Work Note  02/05/2022 Name: Michelle [name] MRN: [id number] DOB: 1950-12-31  [name] is a 71 y.o. year old female who is a primary care patient of [name], MD. The CCM team was consulted to assist the patient with chronic disease management and/or care coordination needs related to: Level of Care Concerns and Caregiver Stress.   Engaged with patient by telephone for follow up visit in response to provider referral for social work chronic care management and care coordination services.   Consent to Services:  The patient was given information about Chronic Care Management services, agreed to services, and gave verbal consent prior to initiation of services.  Please see initial visit note for detailed documentation.   Patient agreed to services and consent obtained.   Summary: Assessed patient's current treatment, progress, coping skills, support system and barriers to care.  Patient was accompanied by her sister [name], [place] who provided information during this encounter. She is making progress, with meeting patient's needs, patient really likes the meals on wheels and reports eating well.  APC case is now closed and Sister is providing oversight for needs.  Sister reports continued concern with patient's memory. Will discuss this with PCP .  See Care Plan below for interventions and patient self-care actives.  Recommendation: Patient may benefit from, and sister is in agreement follow up on enhanced insurance benefits.   Follow up Plan: Patient's caregiver would like continued follow-up from CCM LCSW .  per caregiver's request CCM LCSW will follow up in 45 to 60 days.  They will call the office if needed prior to next encounter.   Assessment: Review of patient past medical history, allergies, medications, and health status, including review of relevant consultants reports was performed today as part of a comprehensive evaluation and  provision of chronic care management and care coordination services.     SDOH (Social Determinants of Health) assessments and interventions performed:    Advanced Directives Status:  has documents will complete and bring to office  CCM Care Plan   Conditions to be addressed/monitored: ; Mental Health Concerns  and Memory Deficits  Care Plan : LCSW Plan of Care  Updates made by [name], LCSW since 02/05/2022 12:00 AM     Problem: Home and Family Safety (Wellness)      Goal: Home and Family Safety Maintained   Start Date: 12/15/2021  This Visit's Progress: On track  Recent Progress: On track  Priority: High  Note:   Current Barriers:  Chronic disease management support and education needs related to  HLD, Osteoporosis, Anxiety/Depression   Critical Safety Needs ; concerns with memory  CSW Clinical Goal(s):  Patient  and Caregiver  explore community resource options discussed during encounter for unmet needs related to:  Therapist, art through collaboration with [place] representative, provider, and care team.   Interventions: 1:1 collaboration with primary care provider regarding development and update of comprehensive plan of care as evidenced by provider attestation and co-signature Inter-disciplinary care team collaboration (see longitudinal plan of care) Evaluation of current treatment plan related to  self management and patient's adherence to plan as established by provider Review resources, discussed options and provided patient information about  Department of Social Services ( has Active APS case; discussed Agricultural engineer  ) PACE Program (on hold for now per sister) Publishing rights manager (PCS) Meals on wheels (started receiving first meal today)  Social Determinants of Health in  Patient with HLD, Osteoporosis, and memory difficulty  :  (Status: Goal on Track (progressing): YES. Goal Met. Patient declined further engagement on this  goal.) SDOH assessments completed: Housing , Transportation, Landscape architect , and None Evaluation of current treatment plan related to unmet needs  Current level of care: home with other family or significant other(s): significant other and family member: sister [name] Evaluation of patient safety in current living environment ( APS case closed) Solution-Focused Strategies employed:  Problem [place] strategies reviewed   Mental Health:  (Status: New goal.) Evaluation of current treatment plan related to Caregiver Stress Solution-Focused Strategies employed:  Active listening / Reflection utilized  Emotional Support Provided Problem [place] strategies reviewed   Patient Self-Care Activities: Call Department of Social Services [phone] to apply for foodstamps Call insurance provider to follow up on enhanced benefits     Casimer Lanius, [place] Licensed Clinical Social Worker [employer]ossie Arbour Artist [place]  [phone]

## 2022-02-06 ENCOUNTER — Other Ambulatory Visit: Payer: Self-pay

## 2022-02-06 ENCOUNTER — Ambulatory Visit (INDEPENDENT_AMBULATORY_CARE_PROVIDER_SITE_OTHER): Payer: Medicare HMO

## 2022-02-06 VITALS — BP 120/60 | HR 66 | Temp 98.1°F | Ht 64.0 in | Wt 95.8 lb

## 2022-02-06 DIAGNOSIS — Z0000 Encounter for general adult medical examination without abnormal findings: Secondary | ICD-10-CM | POA: Diagnosis not present

## 2022-02-06 DIAGNOSIS — R413 Other amnesia: Secondary | ICD-10-CM

## 2022-02-06 DIAGNOSIS — Z1211 Encounter for screening for malignant neoplasm of colon: Secondary | ICD-10-CM | POA: Diagnosis not present

## 2022-02-06 DIAGNOSIS — Z23 Encounter for immunization: Secondary | ICD-10-CM

## 2022-02-06 DIAGNOSIS — Z1239 Encounter for other screening for malignant neoplasm of breast: Secondary | ICD-10-CM

## 2022-02-06 DIAGNOSIS — Z122 Encounter for screening for malignant neoplasm of respiratory organs: Secondary | ICD-10-CM | POA: Diagnosis not present
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
## 2022-02-06 NOTE — Patient Instructions (Signed)
Michelle [name] , Thank you for taking time to come for your Medicare Wellness Visit. I appreciate your ongoing commitment to your health goals. Please review the following plan we discussed and let me know if I can assist you in the future.   Screening recommendations/referrals: Colonoscopy: 11/04/2014; due every 5 years (due: 11/05/2019) Mammogram: 06/23/2018; due every 1-2 years (due: 06/24/2019) Bone Density: 01/14/2020; due every 2-3 years (due: 01/13/2023) Recommended yearly ophthalmology/optometry visit for glaucoma screening and checkup Recommended yearly dental visit for hygiene and checkup  Vaccinations: Influenza vaccine: 02/06/2022 Pneumococcal vaccine: 07/30/2016, 01/08/2022 Tdap vaccine: 08/24/2015; due every 10 years (due: 08/23/2025) Shingles vaccine: never done   Covid-19: never done  Advanced directives: Please bring a copy of your health care power of attorney and living will to the office at your convenience.  Conditions/risks identified: Reviewed health maintenance screenings with patient today and relevant education, vaccines, and/or referrals were provided.    Continue to eat heart healthy diet (full of fruits, vegetables, whole grains, lean protein, water--limit salt, fat, and sugar intake) and increase physical activity as tolerated.   Continue doing brain stimulating activities (puzzles, reading, adult coloring books, staying active) to keep memory sharp.   Next appointment: Please schedule your next Medicare Wellness Visit with your Nurse Health Advisor in 1 year by calling [phone].   Preventive Care 72 Years and Older, Female Preventive care refers to lifestyle choices and visits with your health care provider that can promote health and wellness. What does preventive care include? A yearly physical exam. This is also called an annual well check. Dental exams once or twice a year. Routine eye exams. Ask your health care provider how often you should have your eyes  checked. Personal lifestyle choices, including: Daily care of your teeth and gums. Regular physical activity. Eating a healthy diet. Avoiding tobacco and drug use. Limiting alcohol use. Practicing safe sex. Taking low-dose aspirin every day. Taking vitamin and mineral supplements as recommended by your health care provider. What happens during an annual well check? The services and screenings done by your health care provider during your annual well check will depend on your age, overall health, lifestyle risk factors, and family history of disease. Counseling  Your health care provider may ask you questions about your: Alcohol use. Tobacco use. Drug use. Emotional well-being. Home and relationship well-being. Sexual activity. Eating habits. History of falls. Memory and ability to understand (cognition). Work and work Statistician. Reproductive health. Screening  You may have the following tests or measurements: Height, weight, and BMI. Blood pressure. Lipid and cholesterol levels. These may be checked every 5 years, or more frequently if you are over 72 years old. Skin check. Lung cancer screening. You may have this screening every year starting at age 72 if you have a 30-pack-year history of smoking and currently smoke or have quit within the past 15 years. Fecal occult blood test (FOBT) of the stool. You may have this test every year starting at age 72. Flexible sigmoidoscopy or colonoscopy. You may have a sigmoidoscopy every 5 years or a colonoscopy every 10 years starting at age 72. Hepatitis C blood test. Hepatitis B blood test. Sexually transmitted disease (STD) testing. Diabetes screening. This is done by checking your blood sugar (glucose) after you have not eaten for a while (fasting). You may have this done every 1-3 years. Bone density scan. This is done to screen for osteoporosis. You may have this done starting at age 72. Mammogram. This may be done every  1-2  years. Talk to your health care provider about how often you should have regular mammograms. Talk with your health care provider about your test results, treatment options, and if necessary, the need for more tests. Vaccines  Your health care provider may recommend certain vaccines, such as: Influenza vaccine. This is recommended every year. Tetanus, diphtheria, and acellular pertussis (Tdap, Td) vaccine. You may need a Td booster every 10 years. Zoster vaccine. You may need this after age 72. Pneumococcal 13-valent conjugate (PCV13) vaccine. One dose is recommended after age 72. Pneumococcal polysaccharide (PPSV23) vaccine. One dose is recommended after age 72. Talk to your health care provider about which screenings and vaccines you need and how often you need them. This information is not intended to replace advice given to you by your health care provider. Make sure you discuss any questions you have with your health care provider. Document Released: 12/30/2015 Document Revised: 08/22/2016 Document Reviewed: 10/04/2015 Elsevier Interactive Patient Education  2017 [place] Prevention in the Home Falls can cause injuries. They can happen to people of all ages. There are many things you can do to make your home safe and to help prevent falls. What can I do on the outside of my home? Regularly fix the edges of walkways and driveways and fix any cracks. Remove anything that might make you trip as you walk through a door, such as a raised step or threshold. Trim any bushes or trees on the path to your home. Use bright outdoor lighting. Clear any walking paths of anything that might make someone trip, such as rocks or tools. Regularly check to see if handrails are loose or broken. Make sure that both sides of any steps have handrails. Any raised decks and porches should have guardrails on the edges. Have any leaves, snow, or ice cleared regularly. Use sand or salt on walking paths  during winter. Clean up any spills in your garage right away. This includes oil or grease spills. What can I do in the bathroom? Use night lights. Install grab bars by the toilet and in the tub and shower. Do not use towel bars as grab bars. Use non-skid mats or decals in the tub or shower. If you need to sit down in the shower, use a plastic, non-slip stool. Keep the floor dry. Clean up any water that spills on the floor as soon as it happens. Remove soap buildup in the tub or shower regularly. Attach bath mats securely with double-sided non-slip rug tape. Do not have throw rugs and other things on the floor that can make you trip. What can I do in the bedroom? Use night lights. Make sure that you have a light by your bed that is easy to reach. Do not use any sheets or blankets that are too big for your bed. They should not hang down onto the floor. Have a firm chair that has side arms. You can use this for support while you get dressed. Do not have throw rugs and other things on the floor that can make you trip. What can I do in the kitchen? Clean up any spills right away. Avoid walking on wet floors. Keep items that you use a lot in easy-to-reach places. If you need to reach something above you, use a strong step stool that has a grab bar. Keep electrical cords out of the way. Do not use floor polish or wax that makes floors slippery. If you must use wax, use non-skid  floor wax. Do not have throw rugs and other things on the floor that can make you trip. What can I do with my stairs? Do not leave any items on the stairs. Make sure that there are handrails on both sides of the stairs and use them. Fix handrails that are broken or loose. Make sure that handrails are as long as the stairways. Check any carpeting to make sure that it is firmly attached to the stairs. Fix any carpet that is loose or worn. Avoid having throw rugs at the top or bottom of the stairs. If you do have throw  rugs, attach them to the floor with carpet tape. Make sure that you have a light switch at the top of the stairs and the bottom of the stairs. If you do not have them, ask someone to add them for you. What else can I do to help prevent falls? Wear shoes that: Do not have high heels. Have rubber bottoms. Are comfortable and fit you well. Are closed at the toe. Do not wear sandals. If you use a stepladder: Make sure that it is fully opened. Do not climb a closed stepladder. Make sure that both sides of the stepladder are locked into place. Ask someone to hold it for you, if possible. Clearly mark and make sure that you can see: Any grab bars or handrails. First and last steps. Where the edge of each step is. Use tools that help you move around (mobility aids) if they are needed. These include: Canes. Walkers. Scooters. Crutches. Turn on the lights when you go into a dark area. Replace any light bulbs as soon as they burn out. Set up your furniture so you have a clear path. Avoid moving your furniture around. If any of your floors are uneven, fix them. If there are any pets around you, be aware of where they are. Review your medicines with your doctor. Some medicines can make you feel dizzy. This can increase your chance of falling. Ask your doctor what other things that you can do to help prevent falls. This information is not intended to replace advice given to you by your health care provider. Make sure you discuss any questions you have with your health care provider. Document Released: 09/29/2009 Document Revised: 05/10/2016 Document Reviewed: 01/07/2015 Elsevier Interactive Patient Education  2017 [employer].

## 2022-02-09 ENCOUNTER — Telehealth: Payer: Self-pay | Admitting: Internal Medicine

## 2022-02-09 NOTE — Telephone Encounter (Signed)
Received an order fot PT for pt. Went to do welcome visit to get her scheduled.   [name], her sister is emergency contact, which is whom she spoke with and states pt was never supposed to have PT order. When she came in, pt and sister  under impression there would be an order for brain scan not PT.    Declined services, and was submitted as a non- admit.

## 2022-02-09 NOTE — Telephone Encounter (Signed)
Noted  

## 2022-02-19 ENCOUNTER — Ambulatory Visit (INDEPENDENT_AMBULATORY_CARE_PROVIDER_SITE_OTHER): Payer: Medicare HMO | Admitting: Licensed Clinical Social Worker

## 2022-02-19 DIAGNOSIS — I10 Essential (primary) hypertension: Secondary | ICD-10-CM

## 2022-02-19 DIAGNOSIS — Z636 Dependent relative needing care at home: Secondary | ICD-10-CM

## 2022-02-19 DIAGNOSIS — F411 Generalized anxiety disorder: Secondary | ICD-10-CM

## 2022-02-19 NOTE — Patient Instructions (Addendum)
Visit Information ? ?Thank you for taking time to visit with me today. Please don't hesitate to contact me if I can be of assistance to you before our next scheduled telephone appointment. ? ?Following are the goals we discussed today: Caregiver support ?Patient Self-Care Activities: ?Continue to work with care team from [facility] medical needs ?Our next appointment is by telephone on April 11th  ? ?Please call the care guide team at [phone] if you need to cancel or reschedule your appointment.  ? ?If you are experiencing a Mental Health or [place] or need someone to talk to, please call 1-800-273-TALK (toll free, 24 hour hotline)  ? ?Patient verbalizes understanding of instructions and care plan provided today and agrees to view in [place]. Active MyChart status confirmed with patient.   ? ?Casimer Lanius, LCSW ?Licensed Clinical Social Worker [facility] [employer]  ?[place]  ?[phone]  ?

## 2022-02-19 NOTE — Chronic Care Management (AMB) (Signed)
?Chronic Care Management  ? Clinical Social Work Note ? ?02/19/2022 ?Name: Michelle [name] MRN: [id number] DOB: [birth year] ? ?Michelle [name] is a 71 y.o. year old female who is a primary care patient of [name], MD. The CCM team was consulted to assist the patient with chronic disease management and/or care coordination needs related to: Level of Care Concerns and Caregiver Stress.  ? ?Collaboration with patient's sister  for follow up visit in response to provider referral for social work chronic care management and care coordination services.  ? ?Consent to Services:  ?The patient was given information about Chronic Care Management services, agreed to services, and gave verbal consent prior to initiation of services.  Please see initial visit note for detailed documentation.  ? ?Patient agreed to services and consent obtained.  ? ?Summary: Assessed patient's current treatment, progress, coping skills, support system and barriers to care.  Sister is making progress with and has been able to obtain enhanced benefits card for additional food and help with paying bills .  See Care Plan below for interventions and patient self-care actives. ? ?Recommendation: Patient may benefit from, and sister is in agreement to continue to advocated for assessment of her memory concerns.  ? ?Follow up Plan: Patient's caregiver would like continued follow-up from CCM LCSW .  per caregiver's request CCM LCSW will follow up in 30 days.  They will call the office if needed prior to next encounter. ?  ?Assessment: Review of patient past medical history, allergies, medications, and health status, including review of relevant consultants reports was performed today as part of a comprehensive evaluation and provision of chronic care management and care coordination services.    ? ?SDOH (Social Determinants of Health) assessments and interventions performed:   ? ?Advanced Directives Status:  Sister will bring to next office  visit ? ?CCM Care Plan ?Conditions to be addressed/monitored: ; Level of care concerns ? ?Care Plan : LCSW Plan of Care  ?Updates made by [name], LCSW since 02/19/2022 12:00 AM  ?  ? ?Problem: Home and Family Safety (Wellness)   ?  ? ?Goal: Home and Family Safety Maintained   ?Start Date: 12/15/2021  ?This Visit's Progress: On track  ?Recent Progress: On track  ?Priority: High  ?Note:   ?Current Barriers:  ?Chronic disease management support and education needs related to  HLD, Osteoporosis, Anxiety/Depression   ?Critical Safety Needs ; concerns with memory ? ?CSW Clinical Goal(s):  ?Patient  and Caregiver  explore community resource options discussed during encounter for unmet needs related to:  caregiver support through collaboration with Holiday representative, provider, and care team.  ? ?Interventions: ?1:1 collaboration with primary care provider regarding development and update of comprehensive plan of care as evidenced by provider attestation and co-signature ?Inter-disciplinary care team collaboration (see longitudinal plan of care) ?Evaluation of current treatment plan related to  self management and patient's adherence to plan as established by provider ?Review resources, discussed options and provided patient information about  ?Department of Social Services ( has Active APS case; discussed Agricultural engineer  ) ?PACE Program (on hold for now per sister) ?Personal Care Services [facility]) ?Meals on wheels (started receiving first meal today) ? ?Social Determinants of Health in Patient with HLD, Osteoporosis, and memory difficulty  :  (Status: Goal on Track (progressing): YES. Goal Met. Patient declined further engagement on this goal.) ?SDOH assessments completed: Housing , Transportation, [employer] , and None ?Evaluation of current treatment plan related to  unmet needs ? Current level of care: home with other family or significant other(s): significant other and family member: sister  [name] ?Evaluation of patient safety in current living environment ( APS case closed) ?Solution-Focused Strategies employed:  ?Problem [place] strategies reviewed ?  ?Mental Health:  (Status: New goal.) ?Evaluation of current treatment plan related to Caregiver Stress ?Solution-Focused Strategies employed:  ?Active listening / Reflection utilized  ?Emotional Support Provided ?Problem Solving /Task Center strategies reviewed ?Caregiver stress acknowledged  ?  ?Patient Self-Care Activities: ?Continue to work with care team from [facility] medical needs ?  ?  ?Casimer Lanius, LCSW ?Licensed Clinical Social Worker [facility] [employer]  ?[place]  ?[phone]  ? ? ?

## 2022-03-08 ENCOUNTER — Ambulatory Visit: Payer: Medicare Other

## 2022-03-08 DIAGNOSIS — I10 Essential (primary) hypertension: Secondary | ICD-10-CM

## 2022-03-08 DIAGNOSIS — M810 Age-related osteoporosis without current pathological fracture: Secondary | ICD-10-CM

## 2022-03-08 DIAGNOSIS — E785 Hyperlipidemia, unspecified: Secondary | ICD-10-CM

## 2022-03-08 NOTE — Progress Notes (Signed)
? ?Chronic Care Management ?Pharmacy Note ? ?03/08/2022 ?Name:  Michelle [name] MRN:  [id number] DOB:  July 21, 1951 ? ?Summary: ?-Spoke patient, [facility]  ?-Was not in her home at the time of the call - difficult historian with her medications, was unsure of the names/ doses of medications, but reports adherence of pill packs ?-Denies any issues or concerns with current medications  ?-Following with LCSW - has started getting meals on wheels (started earlier this month) ? ?Recommendations/Changes made from today's visit: ?-Recommending no changes to medications at this time, patient to reach out with any issues or concerns regarding medications ? ? ?Subjective: ?Michelle [name] is an 71 y.o. year old female who is a primary patient of [name], MD.  The CCM team was consulted for assistance with disease management and care coordination needs.   ? ?Engaged with patient by telephone for follow up visit in response to provider referral for pharmacy case management and/or care coordination services.  ? ?Consent to Services:  ?The patient was given information about Chronic Care Management services, agreed to services, and gave verbal consent prior to initiation of services.  Please see initial visit note for detailed documentation.  ? ?Patient Care Team: ?[name], MD as PCP - General (Internal Medicine) ?Foltanski, [place], [facility] as Pharmacist (Pharmacist) ?[name], LCSW as Education officer, museum (Licensed Holiday representative) ? ? ?Patient lives at home with a roommate. She has significant memory problems/dementia and has seen neurology previously but not in past 2 years. She admits it is difficult for her to get food due to financial issues. Now receiving Meals on Wheels  ? ?Recent office visits: ?01/08/2022 - Dr. Ronnald Ramp - no changes to medications - f/u in 6 months  ?12/04/2021- Dr. Jenny Reichmann - cough - Xray ordered - social services referral placed to  address financial / home situation - no changes to medications ? ?Recent consult visits: ?None in previous 6 months. ?  ?Hospital visits: ?None in previous 6 months  ? ?Objective: ? ?Lab Results  ?Component Value Date  ? CREATININE 1.15 01/08/2022  ? BUN 16 01/08/2022  ? GFR 47.80 (L) 01/08/2022  ? GFRNONAA 53 (L) 07/30/2021  ? GFRAA 64 07/12/2020  ? NA 141 01/08/2022  ? K 4.0 01/08/2022  ? CALCIUM 8.7 01/08/2022  ? CO2 32 01/08/2022  ? ? ?Lab Results  ?Component Value Date/Time  ? GFR 47.80 (L) 01/08/2022 03:04 PM  ? GFR 47.57 (L) 03/28/2021 02:00 PM  ?  ?Last diabetic Eye exam: No results found for: HMDIABEYEEXA  ?Last diabetic Foot exam: No results found for: HMDIABFOOTEX  ? ?Lab Results  ?Component Value Date  ? CHOL 134 03/28/2021  ? HDL 40.80 03/28/2021  ? [place] 72 03/28/2021  ? LDLDIRECT 113 (H) 06/16/2008  ? TRIG 106.0 03/28/2021  ? CHOLHDL 3 03/28/2021  ? ? ? ?  Latest Ref Rng & Units 07/18/2021  ?  3:55 PM 05/18/2021  ? 10:20 AM 05/08/2021  ?  3:47 AM  ?Hepatic Function  ?Total Protein 6.5 - 8.1 g/dL 6.1   5.9   6.4    ?Albumin 3.5 - 5.0 g/dL 3.4   3.0   2.7    ?AST 15 - 41 U/L 21   28   16     ?ALT 0 - 44 U/L 10   14   16     ?Alk Phosphatase 38 - 126 U/L 51   57  82    ?Total Bilirubin 0.3 - 1.2 mg/dL 0.6   0.6   0.6    ?Bilirubin, Direct 0.0 - 0.2 mg/dL <0.1      ? ? ?Lab Results  ?Component Value Date/Time  ? TSH 1.80 01/08/2022 03:04 PM  ? TSH 3.716 05/06/2021 08:40 PM  ? TSH 1.81 03/28/2021 02:00 PM  ? ? ? ?  Latest Ref Rng & Units 01/08/2022  ?  3:04 PM 07/30/2021  ?  8:02 PM 07/18/2021  ? 12:42 PM  ?CBC  ?WBC 4.0 - 10.5 K/uL 6.2   5.8   6.4    ?Hemoglobin 12.0 - 15.0 g/dL 12.6   14.3   12.6    ?Hematocrit 36.0 - 46.0 % 39.1   44.1   39.7    ?Platelets 150.0 - 400.0 K/uL 229.0   245   247    ? ? ?No results found for: VD25OH ? ?Clinical ASCVD: No  ?The 10-year ASCVD risk score (Arnett DK, et al., 2019) is: 15.1% ?  Values used to calculate the score: ?    Age: 71 years ?    Sex: Female ?    Is Non-Hispanic  African American: No ?    Diabetic: No ?    Tobacco smoker: Yes ?    Systolic Blood Pressure: 960 mmHg ?    Is BP treated: No ?    HDL Cholesterol: 40.8 mg/dL ?    Total Cholesterol: 134 mg/dL   ? ? ?  01/08/2022  ?  1:22 PM 03/28/2021  ?  1:23 PM 12/02/2020  ?  4:36 PM  ?Depression screen PHQ 2/9  ?Decreased Interest 0 0 0  ?Down, Depressed, Hopeless 0 0 0  ?PHQ - 2 Score 0 0 0  ?Altered sleeping 0 0   ?Tired, decreased energy 0 0   ?Change in appetite 0 0   ?Feeling bad or failure about yourself  0 0   ?Trouble concentrating 0 0   ?Moving slowly or fidgety/restless 0 0   ?Suicidal thoughts 0 0   ?PHQ-9 Score 0 0   ?  ? ?  12/02/2019  ?  1:24 PM 10/29/2017  ?  6:15 PM  ?GAD 7 : Generalized Anxiety Score  ?Nervous, Anxious, on Edge 1 3  ?Control/stop worrying 0 1  ?Worry too much - different things 0 2  ?Trouble relaxing 0 0  ?Restless 1 1  ?Easily annoyed or irritable 0 2  ?Afraid - awful might happen 0 0  ?Total GAD 7 Score 2 9  ?Anxiety Difficulty Not difficult at all   ? ? ?Social History  ? ?Tobacco Use  ?Smoking Status Every Day  ? Packs/day: 1.00  ? Years: 43.00  ? Pack years: 43.00  ? Types: Cigarettes  ? Last attempt to quit: 11/02/2019  ? Years since quitting: 2.3  ?Smokeless Tobacco Never  ?Tobacco Comments  ? Quit smoking almost 3 years ago (per pt on 06/27/2017)  ? 02/06/2022: Patient started back smoking 1 ppd  ? ?BP Readings from Last 3 Encounters:  ?02/06/22 120/60  ?01/08/22 110/62  ?12/04/21 110/70  ? ?Pulse Readings from Last 3 Encounters:  ?02/06/22 66  ?01/08/22 66  ?12/04/21 (!) 56  ? ?Wt Readings from Last 3 Encounters:  ?02/06/22 95 lb 12.8 oz (43.5 kg)  ?01/08/22 97 lb (44 kg)  ?12/04/21 93 lb (42.2 kg)  ? ?BMI Readings from Last 3 Encounters:  ?02/06/22 16.44 kg/m?  ?01/08/22 18.94 kg/m?  ?12/04/21 18.16 kg/m?  ? ? ?  Assessment/Interventions: Review of patient past medical history, allergies, medications, health status, including review of consultants reports, laboratory and other test data,  was performed as part of comprehensive evaluation and provision of chronic care management services.  ? ?SDOH:  (Social Determinants of Health) assessments and interventions performed: Yes ? ? ?[place] ? ?Allergies  ?Allergen Reactions  ? Aricept [Donepezil] Other (See Comments)  ?  bradycardia  ? Penicillins Rash  ?  Has patient had a PCN reaction causing immediate rash, facial/tongue/throat swelling, SOB or lightheadedness with hypotension: Yes ?Has patient had a PCN reaction causing severe rash involving mucus membranes or skin necrosis: No ?Has patient had a PCN reaction that required hospitalization: No ?Has patient had a PCN reaction occurring within the last 10 years: No ?If all of the above answers are "NO", then may proceed with Cephalosporin use. ?  ? ? ?Medications Reviewed Today   ? ? Reviewed by Sheral Flow, LPN (Licensed Practical Nurse) on 02/06/22 at 1358  Med List Status: <None>  ? ?Medication Order Taking? Sig Documenting Provider Last Dose Status Informant  ?acetaminophen (TYLENOL) 325 MG tablet [id number] Yes Take 2 tablets (650 mg total) by mouth every 6 (six) hours as needed for mild pain (or Fever >/= 101). Raiford Noble [place], DO Taking Active   ?albuterol (VENTOLIN HFA) 108 (90 Base) MCG/ACT inhaler [id number] Yes Inhale 1-2 puffs into the lungs every 6 (six) hours as needed for wheezing or shortness of breath. Long, Wonda [place], MD Taking Active   ?calcium-vitamin D (OSCAL WITH D) 500-200 MG-UNIT tablet [id number] Yes Take 1 tablet by mouth daily. [provider] Taking Active   ?levothyroxine (SYNTHROID) 50 MCG tablet [id number] Yes TAKE ONE TABLET BY MOUTH AT Gildardo Griffes, MD Taking Active   ?sertraline (ZOLOFT) 100 MG tablet [id number] Yes TAKE ONE TABLET BY MOUTH AT Gildardo Griffes, MD Taking Active   ?simvastatin (ZOCOR) 10 MG tablet [id number] Yes TAKE ONE TABLET BY MOUTH AT Gildardo Griffes, MD Taking Active   ? ?  ?  ? ?  ? ? ?Patient Active Problem  List  ? Diagnosis Date Noted  ? Food insecurity 12/05/2021  ? Pulmonary edema 05/07/2021  ? Manifestations of thiamine deficiency 11/04/2020  ? Hypomagnesemia 11/01/2020  ? Tobacco abuse 11/01/2020  ? Cachex

## 2022-03-08 NOTE — Patient Instructions (Signed)
Visit Information ? ?Following are the goals we discussed today:  ? ?Manage My Medicine  ? ?Timeframe:  Long-Range Goal ?Priority:  Medium ?Start Date:    04/25/21                         ?Expected End Date:    03/08/2022             ? ?Follow Up Date September 2023 ?  ?- call for medicine refill 2 or 3 days before it runs out ?- call if I am sick and can't take my medicine ?- keep a list of all the medicines I take; vitamins and herbals too ?-Utilize [facility] for medication synchronization, packaging and delivery  ?  ?Why is this important?   ?These steps will help you keep on track with your medicines. ? ?Plan: Telephone follow up appointment with care management team member scheduled for:  6 months ?The patient has been provided with contact information for the care management team and has been advised to call with any health related questions or concerns.  ? ?Tomasa Blase, PharmD ?Clinical Pharmacist, [place]  ? ?Please call the care guide team at [phone] if you need to cancel or reschedule your appointment.  ? ?Patient verbalizes understanding of instructions and care plan provided today and agrees to view in [place]. Active MyChart status confirmed with patient.   ? ?

## 2022-03-12 ENCOUNTER — Other Ambulatory Visit: Payer: Self-pay | Admitting: Internal Medicine

## 2022-03-12 DIAGNOSIS — F419 Anxiety disorder, unspecified: Secondary | ICD-10-CM

## 2022-03-21 ENCOUNTER — Ambulatory Visit: Payer: Medicare HMO | Admitting: Internal Medicine

## 2022-03-26 ENCOUNTER — Telehealth: Payer: Self-pay

## 2022-03-26 NOTE — Telephone Encounter (Signed)
Spoke with Gwynne Edinger, sister, (approved dpr) to schedule annual LDCT.  Sister states they would like to discuss with Dr. Ronnald Ramp at upcoming visit.  She has the call back number for scheduling if they decide to move forward.   ?

## 2022-03-27 ENCOUNTER — Ambulatory Visit (INDEPENDENT_AMBULATORY_CARE_PROVIDER_SITE_OTHER): Payer: Medicare HMO | Admitting: Licensed Clinical Social Worker

## 2022-03-27 DIAGNOSIS — I10 Essential (primary) hypertension: Secondary | ICD-10-CM

## 2022-03-27 DIAGNOSIS — F320 Major depressive disorder, single episode, mild: Secondary | ICD-10-CM

## 2022-03-27 DIAGNOSIS — Z636 Dependent relative needing care at home: Secondary | ICD-10-CM

## 2022-03-27 NOTE — Chronic Care Management (AMB) (Signed)
?Chronic Care Management  ? Clinical Social Work Note ? ?03/27/2022 ?Name: Michelle [name] MRN: [id number] DOB: Jun 16, 1951 ? ?Michelle [name] is a 71 y.o. year old female who is a primary care patient of [name], MD. The CCM team was consulted to assist the patient with chronic disease management and/or care coordination needs related to: Level of Care Concerns and Caregiver Stress.  ? ?Collaboration with patient's sister  for follow up visit in response to provider referral for social work chronic care management and care coordination services.  ? ?Consent to Services:  ?The patient was given information about Chronic Care Management services, agreed to services, and gave verbal consent prior to initiation of services.  Please see initial visit note for detailed documentation.  ?Patient's sister agreed to services and consent obtained.  ? ?Summary: Patient's sister Michelle [name] provided all information during this encounter. She continues to have concerns with patient's memory and would like PCP to assess her cognitive ability to rule out her concerns of dementia.  Patient continue to live in her home with limited daily support. See Care Plan below for interventions and patient self-care actives. ? ?Recommendation: Patient may benefit from, and Sister is in agreement to discuss memory concerns with PCP during office visit 03/28/22 and explore in-home and out of home care options. ? ?Follow up Plan: Patient's caregiver would like continued follow-up from CCM LCSW .  per caregiver's request CCM LCSW will follow up in 60 days.  They will call the office if needed prior to next encounter. ?  ?Assessment: Review of patient past medical history, allergies, medications, and health status, including review of relevant consultants reports was performed today as part of a comprehensive evaluation and provision of chronic care management and care coordination services.    ? ?SDOH (Social Determinants of Health) assessments  and interventions performed:   ? ?Advanced Directives Status:  Sister will bring to office at next appointment ? ?CCM Care Plan ?Conditions to be addressed/monitored: HTN and Depression;  ? ?Care Plan : LCSW Plan of Care  ?Updates made by [name], LCSW since 03/27/2022 12:00 AM  ?  ? ?Problem: Home and Family Safety (Wellness)   ?  ? ?Goal: Home and Family Safety Maintained   ?Start Date: 12/15/2021  ?This Visit's Progress: On track  ?Recent Progress: On track  ?Priority: High  ?Note:   ?Current Barriers:  ?Chronic disease management support and education needs related to  HLD, Osteoporosis, Anxiety/Depression   ?Critical Safety Needs ; concerns with memory ? ?CSW Clinical Goal(s):  ?Patient  and Caregiver  explore community resource options discussed during encounter for unmet needs related to:  caregiver support through collaboration with Holiday representative, provider, and care team.  ? ?Interventions: ?1:1 collaboration with primary care provider regarding development and update of comprehensive plan of care as evidenced by provider attestation and co-signature ?Inter-disciplinary care team collaboration (see longitudinal plan of care) ?Evaluation of current treatment plan related to  self management and patient's adherence to plan as established by provider ?Review resources, discussed options and provided patient information about  ?Department of Social Services ( has Active APS case; discussed Agricultural engineer  ) ?PACE Program (on hold for now per sister) ?Personal Care Services [facility]) ?Meals on wheels (started receiving first meal today) ? ?Social Determinants of Health in Patient with HLD, Osteoporosis, and memory difficulty  :  (Status: Goal on Track (progressing): YES. Goal Met. Patient declined further engagement on this goal.) ?SDOH assessments completed:  Housing , Transportation, Food Insecurity , and None ?Evaluation of current treatment plan related to unmet needs ? Current level of  care: home with other family or significant other(s): significant other and family member: sister Michelle [name] ?Evaluation of patient safety in current living environment ( APS case closed) ?Solution-Focused Strategies employed:  ?Problem [place] strategies reviewed ?  ?Mental Health:  (Status: New goal. Goal on Track (progressing): YES.) ?Evaluation of current treatment plan related to Caregiver Stress ?Solution-Focused Strategies employed:  ?Active listening / Reflection utilized  ?Emotional Support Provided ?Problem Solving /Task Center strategies reviewed ?Caregiver stress acknowledged  ?Provided information on support options ( PACE, Personal Care, Assisted Living) ?Other concerns with patient's home ( provider home repair resources) ?  ?Patient Self-Care Activities: ?Continue to work with care team from [facility] medical needs ?Review resources provided ?PACE Brochure ?Personal Care Services information  ?Assisted living options ?Special Assistance for ALT ?Housing repair resources ?  ?  ?Casimer Lanius, LCSW ?Licensed Clinical Social Worker [facility] [employer]  ?[place]  ?[phone]  ?

## 2022-03-27 NOTE — Patient Instructions (Addendum)
Visit Information ? ?Thank you for taking time to visit with me today. Please don't hesitate to contact me if I can be of assistance to you before our next scheduled telephone appointment. ? ?Following are the goals we discussed today: Level of care concerns with Caregiver support ? ?Continue to work with care team from [facility] medical needs ?Review resources provided ?PACE Brochure ?Personal Care Services information  ?Assisted living options ?Special Assistance for ALT ?Housing repair resources ?   ? ?Our next appointment is by telephone on June 13th at 10:00 ? ?Please call the care guide team at [phone] if you need to cancel or reschedule your appointment.  ? ?If you are experiencing a Mental Health or [place] or need someone to talk to, please call 1-800-273-TALK (toll free, 24 hour hotline)  ? ?Patient's sister verbalizes understanding of instructions and care plan provided today and agrees to view in [place]. Active MyChart status confirmed with patient.   ? ?Casimer Lanius, LCSW ?Licensed Clinical Social Worker [employer] [employer]  ?[place]  ?[phone]  ?

## 2022-03-28 ENCOUNTER — Ambulatory Visit (INDEPENDENT_AMBULATORY_CARE_PROVIDER_SITE_OTHER): Payer: Medicare HMO | Admitting: Internal Medicine

## 2022-03-28 ENCOUNTER — Encounter: Payer: Self-pay | Admitting: Internal Medicine

## 2022-03-28 VITALS — BP 116/78 | HR 63 | Temp 98.0°F | Ht 64.0 in | Wt 102.0 lb

## 2022-03-28 DIAGNOSIS — N1832 Chronic kidney disease, stage 3b: Secondary | ICD-10-CM

## 2022-03-28 DIAGNOSIS — E039 Hypothyroidism, unspecified: Secondary | ICD-10-CM | POA: Diagnosis not present

## 2022-03-28 DIAGNOSIS — Q828 Other specified congenital malformations of skin: Secondary | ICD-10-CM

## 2022-03-28 DIAGNOSIS — R4189 Other symptoms and signs involving cognitive functions and awareness: Secondary | ICD-10-CM

## 2022-03-28 NOTE — Progress Notes (Signed)
? ?Subjective:  ?Patient ID: Michelle [name], female    DOB: 1951/12/10  Age: 71 y.o. MRN: [id number] ? ?CC: Hypothyroidism ? ? ?HPI ?Michelle [name] presents for f/up - ? ?She is with her sister today.  Her sister is concerned about declining cognitive function.  Her sister says that the house is in disarray and that the patient is not taking care of herself.  Her sister wants her to see a neurologist.  The patient indicates that she wants to stay in her house for now and does not want to be placed at an assisted living.  She has gained a few pounds since I last saw her. ? ?Outpatient Medications Prior to Visit  ?Medication Sig Dispense Refill  ? acetaminophen (TYLENOL) 325 MG tablet Take 2 tablets (650 mg total) by mouth every 6 (six) hours as needed for mild pain (or Fever >/= 101). 20 tablet 0  ? calcium-vitamin D (OSCAL WITH D) 500-200 MG-UNIT tablet Take 1 tablet by mouth daily.    ? levothyroxine (SYNTHROID) 50 MCG tablet TAKE ONE TABLET BY MOUTH AT NOON 90 tablet 1  ? Multiple Vitamin (MULTIVITAMIN) tablet Take 1 tablet by mouth daily.    ? sertraline (ZOLOFT) 100 MG tablet TAKE ONE TABLET BY MOUTH AT NOON 90 tablet 1  ? simvastatin (ZOCOR) 10 MG tablet TAKE ONE TABLET BY MOUTH AT NOON 90 tablet 1  ? ?No facility-administered medications prior to visit.  ? ? ?ROS ?Review of Systems  ?Constitutional:  Negative for diaphoresis and fatigue.  ?HENT: Negative.    ?Eyes: Negative.   ?Respiratory: Negative.  Negative for cough, chest tightness, shortness of breath and wheezing.   ?Cardiovascular:  Negative for chest pain, palpitations and leg swelling.  ?Gastrointestinal:  Negative for abdominal pain, constipation, diarrhea, nausea and vomiting.  ?Endocrine: Negative.  Negative for cold intolerance and heat intolerance.  ?Genitourinary: Negative.   ?Musculoskeletal: Negative.  Negative for arthralgias and neck stiffness.  ?Skin:  Positive for rash.  ?Neurological: Negative.  Negative for dizziness, weakness and  headaches.  ?Hematological:  Negative for adenopathy. Does not bruise/bleed easily.  ?Psychiatric/Behavioral:  Positive for confusion and decreased concentration. Negative for sleep disturbance and suicidal ideas. The patient is not nervous/anxious.   ? ?Objective:  ?BP 116/78 (BP Location: Right Arm, Patient Position: Sitting, Cuff Size: Normal)   Pulse 63   Temp 98 ?F (36.7 ?C) (Oral)   Ht '5\' 4"'$  (1.626 m)   Wt 102 lb (46.3 kg)   SpO2 94%   BMI 17.51 kg/m?  ? ?BP Readings from Last 3 Encounters:  ?03/28/22 116/78  ?02/06/22 120/60  ?01/08/22 110/62  ? ? ?Wt Readings from Last 3 Encounters:  ?03/28/22 102 lb (46.3 kg)  ?02/06/22 95 lb 12.8 oz (43.5 kg)  ?01/08/22 97 lb (44 kg)  ? ? ?Physical Exam ?Vitals reviewed.  ?HENT:  ?   Nose: Nose normal.  ?   Mouth/Throat:  ?   Mouth: Mucous membranes are moist.  ?Eyes:  ?   General: No scleral icterus. ?   Conjunctiva/sclera: Conjunctivae normal.  ?Cardiovascular:  ?   Rate and Rhythm: Normal rate and regular rhythm.  ?   Heart sounds: No murmur heard. ?Pulmonary:  ?   Effort: Pulmonary effort is normal.  ?   Breath sounds: No stridor. No wheezing, rhonchi or rales.  ?Abdominal:  ?   General: Abdomen is flat.  ?   Palpations: There is no mass.  ?   Tenderness: There is no  abdominal tenderness. There is no guarding.  ?   Hernia: No hernia is present.  ?Musculoskeletal:     ?   General: Normal range of motion.  ?   Cervical back: Neck supple.  ?   Right lower leg: No edema.  ?   Left lower leg: No edema.  ?Lymphadenopathy:  ?   Cervical: No cervical adenopathy.  ?Skin: ?   Findings: Rash present.  ?Neurological:  ?   General: No focal deficit present.  ?   Mental Status: She is alert.  ?Psychiatric:     ?   Attention and Perception: Attention and perception normal.     ?   Mood and Affect: Mood normal.     ?   Speech: Speech is tangential.     ?   Behavior: Behavior normal. Behavior is cooperative.     ?   Cognition and Memory: Cognition is impaired. Memory is impaired.   ? ? ?Lab Results  ?Component Value Date  ? WBC 6.2 01/08/2022  ? HGB 12.6 01/08/2022  ? HCT 39.1 01/08/2022  ? PLT 229.0 01/08/2022  ? GLUCOSE 46 (LL) 01/08/2022  ? CHOL 134 03/28/2021  ? TRIG 106.0 03/28/2021  ? HDL 40.80 03/28/2021  ? LDLDIRECT 113 (H) 06/16/2008  ? [place] 72 03/28/2021  ? ALT 10 07/18/2021  ? AST 21 07/18/2021  ? NA 141 01/08/2022  ? K 4.0 01/08/2022  ? CL 102 01/08/2022  ? CREATININE 1.15 01/08/2022  ? BUN 16 01/08/2022  ? CO2 32 01/08/2022  ? TSH 1.80 01/08/2022  ? INR 1.3 (H) 09/22/2020  ? ? ?DG Chest 2 View ? ?Result Date: 07/30/2021 ?CLINICAL DATA:  Chest pain for several hours EXAM: CHEST - 2 VIEW COMPARISON:  07/18/2021 FINDINGS: Cardiac shadow is stable. Postsurgical changes are seen. The lungs are clear bilaterally. Bilateral nipple shadows are noted. No acute bony abnormality is seen. IMPRESSION: No acute abnormality noted. Electronically Signed   By: Inez Catalina M.D.   On: 07/30/2021 20:47  ? ? ?Assessment & Plan:  ? ?Zilda was seen today for hypothyroidism. ? ?Diagnoses and all orders for this visit: ? ?Cognitive impairment ?-     Ambulatory referral to Neurology is is she currently ? ?Acquired hypothyroidism- Her recent TSH was in the normal range.  She will stay on the current dose of levothyroxine. ? ?Stage 3b chronic kidney disease ([place])- She is avoiding nephrotoxic agents. ? ?Darier-White disease ?-     Discontinue: betamethasone valerate (VALISONE) 0.1 % cream; Apply 1 application. topically 2 (two) times daily. ?-     betamethasone valerate (VALISONE) 0.1 % cream; Apply 1 application. topically 2 (two) times daily. ? ? ?I am having Carli I. Jean maintain her acetaminophen, calcium-vitamin D, levothyroxine, simvastatin, multivitamin, sertraline, and betamethasone valerate. ? ?Meds ordered this encounter  ?Medications  ? DISCONTD: betamethasone valerate (VALISONE) 0.1 % cream  ?  Sig: Apply 1 application. topically 2 (two) times daily.  ?  Dispense:  90 g  ?  Refill:  2  ?  betamethasone valerate (VALISONE) 0.1 % cream  ?  Sig: Apply 1 application. topically 2 (two) times daily.  ?  Dispense:  90 g  ?  Refill:  2  ? ? ? ?Follow-up: Return in about 6 months (around 09/27/2022). ? ?[name], MD ?

## 2022-03-29 ENCOUNTER — Telehealth: Payer: Self-pay | Admitting: Internal Medicine

## 2022-03-29 NOTE — Telephone Encounter (Signed)
Pts sister following up w/ conversation she had w/ provider yesterday regarding pts refills ? ?Sister is making provider aware [facility] has been trying to contact pt regarding refills but pt did not answer the phone due to thinking calls were spam ? ?Medications discussed will be delivered to pt today ?

## 2022-03-30 ENCOUNTER — Encounter: Payer: Self-pay | Admitting: Internal Medicine

## 2022-03-30 MED ORDER — BETAMETHASONE VALERATE 0.1 % EX CREA
1.0000 | TOPICAL_CREAM | Freq: Two times a day (BID) | CUTANEOUS | 2 refills | Status: DC
Start: 2022-03-30 — End: 2022-07-01

## 2022-03-30 MED ORDER — BETAMETHASONE VALERATE 0.1 % EX CREA: 1.0000 "application " | TOPICAL_CREAM | Freq: Two times a day (BID) | CUTANEOUS | 2 refills | Status: DC

## 2022-03-30 NOTE — Patient Instructions (Signed)

## 2022-04-03 ENCOUNTER — Ambulatory Visit (INDEPENDENT_AMBULATORY_CARE_PROVIDER_SITE_OTHER): Payer: Medicare HMO | Admitting: Physician Assistant

## 2022-04-03 ENCOUNTER — Encounter: Payer: Self-pay | Admitting: Physician Assistant

## 2022-04-03 VITALS — BP 96/65 | HR 81 | Resp 20 | Ht 65.0 in | Wt 98.0 lb

## 2022-04-03 DIAGNOSIS — G309 Alzheimer's disease, unspecified: Secondary | ICD-10-CM

## 2022-04-03 DIAGNOSIS — F02818 Dementia in other diseases classified elsewhere, unspecified severity, with other behavioral disturbance: Secondary | ICD-10-CM

## 2022-04-03 MED ORDER — MEMANTINE HCL 10 MG PO TABS: ORAL_TABLET | ORAL | 11 refills | Status: DC

## 2022-04-03 NOTE — Patient Instructions (Addendum)
Start Memantine 10 mg. Take 1 tab  at night for 2 weeks and then increase to 1 tab 2 times a day  ? ?Resume donepezil 10 mg daily start memantine 10 mg, ? ?2. Recommend appointing a Power of Attorney in the event we cannot make decisions for ourselves ? ?3. Appt with our Social Worker [place] ? ?4.Please contact Dr. Anthoney Harada for assessment of decision mental capacity [phone]. ?You can also call [facility] [phone] for guidance on geriatric dementia issues   ? ?4. Follow-up in 6 month call for any questions ? ?FALL PRECAUTIONS: Be cautious when walking. Scan the area for obstacles that may increase the risk of trips and falls. When getting up in the mornings, sit up at the edge of the bed for a few minutes before getting out of bed. Consider elevating the bed at the head end to avoid drop of blood pressure when getting up. Walk always in a well-lit room (use night lights in the walls). Avoid area rugs or power cords from appliances in the middle of the walkways. Use a walker or a cane if necessary and consider physical therapy for balance exercise. Get your eyesight checked regularly. ? ?FINANCIAL OVERSIGHT: Supervision, especially oversight when making financial decisions or transactions is also recommended. ? ?HOME SAFETY: Consider the safety of the kitchen when operating appliances like stoves, microwave oven, and blender. Consider having supervision and share cooking responsibilities until no longer able to participate in those. Accidents with firearms and other hazards in the house should be identified and addressed as well. ? ?DRIVING: Regarding driving, in patients with progressive memory problems, driving will be impaired. We advise to have someone else do the driving if trouble finding directions or if minor accidents are reported. Independent driving assessment is available to determine safety of driving. ? ?ABILITY TO BE LEFT ALONE: If patient is unable to contact  911 operator, consider using Life[facility], or when the need is there, arrange for someone to stay with patients. Smoking is a fire hazard, consider supervision or cessation. Risk of wandering should be assessed by caregiver and if detected at any point, supervision and safe proof recommendations should be instituted. ? ?MEDICATION SUPERVISION: Inability to self-administer medication needs to be constantly addressed. Implement a mechanism to ensure safe administration of the medications. ? ?RECOMMENDATIONS FOR ALL PATIENTS WITH MEMORY PROBLEMS: ?1. Continue to exercise (Recommend 30 minutes of walking everyday, or 3 hours every week) ?2. Increase social interactions - continue going to [place] and enjoy social gatherings with friends and family ?3. Eat healthy, avoid fried foods and eat more fruits and vegetables ?4. Maintain adequate blood pressure, blood sugar, and blood cholesterol level. Reducing the risk of stroke and cardiovascular disease also helps promoting better memory. ?5. Avoid stressful situations. Live a simple life and avoid aggravations. Organize your time and prepare for the next day in anticipation. ?6. Sleep well, avoid any interruptions of sleep and avoid any distractions in the bedroom that may interfere with adequate sleep quality ?7. Avoid sugar, avoid sweets as there is a strong link between excessive sugar intake, diabetes, and cognitive impairment ?The Mediterranean diet has been shown to help patients reduce the risk of progressive memory disorders and reduces cardiovascular risk. This includes eating fish, eat fruits and green leafy vegetables, nuts like almonds and hazelnuts, walnuts, and also use olive oil. Avoid fast foods and fried foods as much as possible. Avoid sweets and sugar as sugar use has been linked to worsening  of memory function. ? ?There is always a concern of gradual progression of memory problems. If this is the case, then we may need to adjust level of care according to  patient needs. Support, both to the patient and caregiver, should then be put into place. ? ?
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
## 2022-04-10 ENCOUNTER — Ambulatory Visit: Payer: Medicare HMO | Admitting: Licensed Clinical Social Worker

## 2022-04-10 ENCOUNTER — Ambulatory Visit
Admission: RE | Admit: 2022-04-10 | Discharge: 2022-04-10 | Disposition: A | Discharge status: Home / Self Care | Payer: Medicare HMO | Source: Ambulatory Visit | Attending: Internal Medicine | Admitting: Internal Medicine

## 2022-04-10 DIAGNOSIS — F4321 Adjustment disorder with depressed mood: Secondary | ICD-10-CM

## 2022-04-10 DIAGNOSIS — Z1231 Encounter for screening mammogram for malignant neoplasm of breast: Secondary | ICD-10-CM | POA: Diagnosis not present

## 2022-04-10 DIAGNOSIS — Z1239 Encounter for other screening for malignant neoplasm of breast: Secondary | ICD-10-CM

## 2022-04-10 NOTE — BH Specialist Note (Signed)
Integrated Behavioral Health Initial In-Person Visit ? ?MRN: [id number] ?Name: Michelle [name] ? ?Number of [place] Clinician visits: No data recorded ?Session Start time: 1440 ?   ?Session End time: 1583 ? ?Total time in minutes: 9 ? ? ?Types of Service: Family psychotherapy ? ?Interpretor:No. Interpretor Name and Language: NA ? ? ? Warm Hand Off Completed. ? ?  ? ?  ? ? ?Subjective: ?Michelle [name] is a 71 y.o. female accompanied by  Sister ?Patient was referred by Sharene Butters fPA-C for Support for caregiver and Pt in the community due to progression of Alzheimer's . ?Patient reports the following symptoms/concerns: Pt report that she has noted function with memory has changed. Caregiver reports concerns with ADL's including medication management , bathing, and overall decline noted with Pt  ?Duration of problem: Several Months; Severity of problem: moderate ? ?Objective: ?Mood: NA and Affect: Appropriate ?Risk of harm to self or others: No plan to harm self or others ? ?Life Context: ?Family and Social: Pt currently resides with a friend name "Maryln Gottron" and has no children and sister lives an hour away  ?School/Work: Pt is retired and did various jobs  ?Self-Care: It is reported that Pt memory has declined and is struggling with medication management , and self care and other ADL's  ?Life Changes: Progression of Alzheimer's  ? ?Patient and/or Family's Strengths/Protective Factors: ?Social connections ? ?Goals Addressed: ?Patient will: ?Reduce symptoms of:  Memory impairment, caregiver feeling overwhelmed with care for Pt, noncompliance with overall medical issues  ?Increase knowledge and/or ability of: healthy habits, self-management skills, and Knowledge of disease, community resources, treatment plans and coping for caregiver    ?Demonstrate ability to: Increase healthy adjustment to current life circumstances and Increase adequate support systems for patient/family ? ?Progress towards  Goals: ?Ongoing ? ?Interventions: ?Interventions utilized: Solution-Focused Strategies, Behavioral Activation, and Link to [employer]  ?Standardized Assessments completed: Not Needed ? ?Patient and/or Family Response: Pt and family open to interventions and resources provided  ? ?Patient Centered Plan: ?Patient is on the following Treatment Plan(s):  Education of Alzheimer, resources in the community for Pt and caregiver including Adult day center and care (PACE) Maximize formal and informal supports .   ? ?Assessment: ?Patient currently experiencing Memory impairment, caregiver feeling overwhelmed with care for Pt, noncompliance with overall medical issues . ?  ?Patient may benefit from Education of Alzheimer, resources in the community for Pt and caregiver including Adult day center and care ([facility]ACE) Maximize formal and informal supports .  . ? ?Plan: ?Follow up with behavioral health clinician on : As needed or three to four weeks  ?Behavioral recommendations: [facility]  ?Referral(s): Community Resources:  [facility]  ?"From scale of 1-10, how likely are you to follow plan?": 9 ? ?Geoffry Bannister A Taylor-Paladino, LCSW ? ? ? ? ? ? ? ? ?

## 2022-04-12 ENCOUNTER — Other Ambulatory Visit: Payer: Self-pay | Admitting: Internal Medicine

## 2022-04-12 DIAGNOSIS — R928 Other abnormal and inconclusive findings on diagnostic imaging of breast: Secondary | ICD-10-CM

## 2022-04-20 ENCOUNTER — Telehealth: Payer: Self-pay

## 2022-04-20 NOTE — Progress Notes (Signed)
? ? ?Chronic Care Management ?Pharmacy Assistant  ? ?Name: Michelle [name]  MRN: [id number] DOB: 01-14-1951 ? ? ?Reason for Encounter: Disease State-General ?  ? ?Recent office visits:  ?None since last coordination call on 03/27/22 ? ?Recent consult visits:  ?04/10/22 Taylor-Paladino, Misty A, LCSW (Adjusted disorder with depressed mood) No orders or med changes ?04/03/22 [name], [name]-Neurology (Alzheimer's dementia with behavioral disturbance) No orders; Medication changes: Memantine HCl 10 mg ? ?Hospital visits:  ?None in previous 6 months ? ?Medications: ?Outpatient Encounter Medications as of 04/20/2022  ?Medication Sig  ? acetaminophen (TYLENOL) 325 MG tablet Take 2 tablets (650 mg total) by mouth every 6 (six) hours as needed for mild pain (or Fever >/= 101).  ? betamethasone valerate (VALISONE) 0.1 % cream Apply 1 application. topically 2 (two) times daily.  ? calcium-vitamin D (OSCAL WITH D) 500-200 MG-UNIT tablet Take 1 tablet by mouth daily.  ? levothyroxine (SYNTHROID) 50 MCG tablet TAKE ONE TABLET BY MOUTH AT NOON  ? memantine (NAMENDA) 10 MG tablet Take 1 tablet (10 mg at night) for 2 weeks, then increase to 1 tablet (10 mg) twice a day  ? Multiple Vitamin (MULTIVITAMIN) tablet Take 1 tablet by mouth daily.  ? sertraline (ZOLOFT) 100 MG tablet TAKE ONE TABLET BY MOUTH AT NOON  ? simvastatin (ZOCOR) 10 MG tablet TAKE ONE TABLET BY MOUTH AT NOON  ? ?No facility-administered encounter medications on file as of 04/20/2022.  ? ?[place] for General Review Call ? ? ?Chart Review: ? ?Have there been any documented new, changed, or discontinued medications since last visit? Yes (If yes, include name, dose, frequency, date)Medication changes: Memantine HCl 10 mg 04/03/22 ?Has there been any documented recent hospitalizations or ED visits since last visit with Clinical Pharmacist? No ?Brief Summary (including medication and/or Diagnosis changes): ? ? ?Adherence Review: ? ?Does the Clinical  Pharmacist Assistant have access to adherence rates? Yes ?Adherence rates for STAR metric medications (List medication(s)/day supply/ last 2 fill dates). ?Adherence rates for medications indicated for disease state being reviewed (List medication(s)/day supply/ last 2 fill dates). ?Does the patient have >5 day gap between last estimated fill dates for any of the above medications or other medication gaps? No ?Reason for medication gaps. ? ? ?Disease State Questions: ?Able to connect with Patient? Yes, spoke with patient sister [name] ?Did patient have any problems with their health recently? Yes ?Note problems and Concerns:Patient is dealing more with Alzheimer's. ?Have you had any admissions or emergency room visits or worsening of your condition(s) since last visit? No ?Details of ED visit, hospital visit and/or worsening condition(s): ?Have you had any visits with new specialists or providers since your last visit? No ?Explain: ?Have you had any new health care problem(s) since your last visit? Yes ?New problem(s) reported:Patient is dealing more with Alzheimer's. 04/03/22 Dr. Shawn Route ?Have you run out of any of your medications since you last spoke with clinical pharmacist? Yes ?What caused you to run out of your medications?Sister [name] states that the patient misplaced all of her medication last week and they could not find them. She call her pharmacy and had the refill them. Patient has been taking all of her meds ?Are there any medications you are not taking as prescribed? No, patient was not taking donepezil at one time but is taking now ?What kept you from taking your medications as prescribed? ?Are you having any issues or side effects with your medications? No ?Note of issues or  side effects: ?Do you have any other health concerns or questions you want to discuss with your Clinical Pharmacist before your next visit? No ?Note additional concerns and questions from Patient. ?Are there any health concerns  that you feel we can do a better job addressing? No ?Note Patient's response. ?Are you having any problems with any of the following since the last visit: (select all that apply) ? Driving ? Details:Sister says she does not drive ?12. Any falls since last visit? No ? Details: ?13. Any increased or uncontrolled pain since last visit? No ? Details: ?14. Next visit Type: telephone ?      Visit with:Clinical Pharmacist ?       Date:09/10/22 ?       Time:3pm ? ?15. Additional Details? No  ?  ?Care Gaps: ?Colonoscopy-11/04/14 ?Diabetic Foot Exam-NA ?Mammogram-04/10/22 ?Ophthalmology-NA ?Dexa Scan -NA  ?Annual Well Visit - NA ?Micro albumin-NA ?Hemoglobin A1c- NA ? ?Star Rating Drugs: ?Simvastatin 10 mg-last fill ? ?Ethelene Hal ?Clinical Pharmacist Assistant ?[phone]  ?

## 2022-04-26 ENCOUNTER — Ambulatory Visit
Admission: RE | Admit: 2022-04-26 | Discharge: 2022-04-26 | Disposition: A | Discharge status: Home / Self Care | Payer: Medicare HMO | Source: Ambulatory Visit | Attending: Internal Medicine | Admitting: Internal Medicine

## 2022-04-26 ENCOUNTER — Other Ambulatory Visit: Payer: Self-pay | Admitting: Internal Medicine

## 2022-04-26 DIAGNOSIS — R928 Other abnormal and inconclusive findings on diagnostic imaging of breast: Secondary | ICD-10-CM

## 2022-04-26 DIAGNOSIS — N6489 Other specified disorders of breast: Secondary | ICD-10-CM | POA: Diagnosis not present

## 2022-04-26 DIAGNOSIS — R922 Inconclusive mammogram: Secondary | ICD-10-CM | POA: Diagnosis not present

## 2022-05-06 ENCOUNTER — Other Ambulatory Visit: Payer: Self-pay | Admitting: Internal Medicine

## 2022-05-06 DIAGNOSIS — E039 Hypothyroidism, unspecified: Secondary | ICD-10-CM

## 2022-05-06 DIAGNOSIS — E785 Hyperlipidemia, unspecified: Secondary | ICD-10-CM

## 2022-05-29 ENCOUNTER — Ambulatory Visit (INDEPENDENT_AMBULATORY_CARE_PROVIDER_SITE_OTHER): Payer: Medicare HMO | Admitting: Licensed Clinical Social Worker

## 2022-05-29 DIAGNOSIS — I10 Essential (primary) hypertension: Secondary | ICD-10-CM

## 2022-05-29 DIAGNOSIS — R4189 Other symptoms and signs involving cognitive functions and awareness: Secondary | ICD-10-CM

## 2022-05-29 NOTE — Chronic Care Management (AMB) (Signed)
Chronic Care Management   Clinical Social Work Note  05/29/2022 Name: Michelle [name] MRN: [id number] DOB: 02-11-1951  Michelle [name] is a 71 y.o. year old female who is a primary care patient of [name], MD. The CCM team was consulted to assist the patient with chronic disease management and/or care coordination needs related to: Level of Care Concerns and Caregiver Stress.   Collaboration with patient's sister  for follow up visit in response to provider referral for social work chronic care management and care coordination services.   Consent to Services:  The patient was given information about Chronic Care Management services, agreed to services, and gave verbal consent prior to initiation of services.  Please see initial visit note for detailed documentation.   Patient's sister agreed to services and consent obtained.   Summary:  Patient continues to have difficultly with meeting all needs.  They have decided to move forward with the PACE program that will allow patient to remain in her home with additional support.  LCSW collaborate with PACE coordinator [place].  Assessment is scheduled this week .  See Care Plan below for interventions and patient self-care actives.  Follow up Plan:  CCM LCSW will continue to collaborate with PACE in order to meet patient's needs . LCSW will disconnect from care team if PACE is approved;  Will follow up with patient's sister is PACE is not approved No follow up scheduled with CCM LCSW at this time. Family will call office if needed.  Assessment: Review of patient past medical history, allergies, medications, and health status, including review of relevant consultants reports was performed today as part of a comprehensive evaluation and provision of chronic care management and care coordination services.     SDOH (Social Determinants of Health) assessments and interventions performed:    Advanced Directives Status: See Vynca application  for related entries.  CCM Care Plan Conditions to be addressed/monitored: Dementia; Level of care concerns  Care Plan : LCSW Plan of Care  Updates made by [name], LCSW since 05/29/2022 12:00 AM     Problem: Home and Family Safety (Wellness)      Goal: Safety Maintained   Start Date: 12/15/2021  This Visit's Progress: On track  Recent Progress: On track  Priority: High  Note:   Current Barriers:  Chronic disease management support and education needs related to  HLD, Osteoporosis, Anxiety/Depression   Critical Safety Needs ; concerns with memory  CSW Clinical Goal(s):  Patient  and Caregiver  explore community resource options discussed during encounter for unmet needs related to:  caregiver support through collaboration with [place] representative, provider, and care team.   Interventions: 1:1 collaboration with primary care provider regarding development and update of comprehensive plan of care as evidenced by provider attestation and co-signature Inter-disciplinary care team collaboration (see longitudinal plan of care) Evaluation of current treatment plan related to  self management and patient's adherence to plan as established by provider  Mental Health:  (Status: New goal. Goal on Track (progressing): YES.) Evaluation of current treatment plan related to Caregiver Stress Solution-Focused Strategies employed:  Active listening / Reflection utilized  Emotional Support Provided Problem [place] strategies reviewed Caregiver stress acknowledged  Provided information on support options ( PACE, Personal Care, Assisted Living) Other concerns with patient's home ( provider home repair resources)  Level of Care Concerns in a patient with Dementia:  (Status: New goal.) Current level of care: home with other family or significant other(s):  significant other Evaluation of patient's unmet needs in current living environment ADL's PACE Program (has started  process and waiting for assessment)  Collaborate with PACE coordinator [place]  Patient Self-Care Activities: Continue to work with care team from [facility] medical needs Review resources provided [place] living options Special Assistance for [place], [place] Licensed Clinical Social Worker Warehouse manager Primary Care [place]  [phone]

## 2022-05-29 NOTE — Patient Instructions (Signed)
Visit Information  Thank you for taking time to visit with me today. Please don't hesitate to contact me if I can be of assistance to you before our next scheduled telephone appointment.  Following are the goals we discussed today:  Patient Self-Care Activities: Continue to work with care team from [facility] medical needs Keep PACE assessment appointment Review resources provided Assisted living options Special Assistance for [place], [place] Licensed Clinical Social Worker Dossie Arbour Management  [place]  [phone]   Per our conversation I will disconnect from the care team if PACE is approved and will follow up with you if it is not approved Please call the care guide team at [phone] if you need to cancel or reschedule your appointment.   Patient 's sister verbalizes understanding of instructions and care plan provided today and agrees to view in [place]. Active MyChart status and patient understanding of how to access instructions and care plan via MyChart confirmed with patient.

## 2022-05-30 ENCOUNTER — Ambulatory Visit: Payer: Self-pay | Admitting: Licensed Clinical Social Worker

## 2022-05-30 ENCOUNTER — Telehealth: Payer: Self-pay | Admitting: Licensed Clinical Social Worker

## 2022-05-30 DIAGNOSIS — I10 Essential (primary) hypertension: Secondary | ICD-10-CM

## 2022-05-30 DIAGNOSIS — R4189 Other symptoms and signs involving cognitive functions and awareness: Secondary | ICD-10-CM

## 2022-05-30 NOTE — Patient Instructions (Signed)
Visit Information  Thank you for taking time to visit with me today. Please don't hesitate to contact me if I can be of assistance to you before our next scheduled telephone appointment.  Following are the goals we discussed today:  Patient Self-Care Activities: Continue to work with care team from [facility] medical needs Review resources e-mailed to [place] living options Special Assistance for [place] List for long-term care facilities Link to explore facilities in other counties [email or web address]      Casimer Lanius, [place] Licensed Clinical Social Worker Dossie Arbour Management  [place]  [phone]   Please call the care guide team at [phone] if you need to cancel or reschedule your appointment.   Patient verbalizes understanding of instructions and care plan provided today and agrees to view in [place]. Active MyChart status and patient understanding of how to access instructions and care plan via MyChart confirmed with patient.

## 2022-05-30 NOTE — Telephone Encounter (Signed)
Pt sister [name] requesting callback regarding PACE program information.  [name]  cb [phone]

## 2022-05-30 NOTE — Chronic Care Management (AMB) (Signed)
Chronic Care Management   Clinical Social Work Note  05/30/2022 Name: Michelle [name] MRN: [id number] DOB: 12-30-1950  Michelle [name] is a 71 y.o. year old female who is a primary care patient of [name], MD. The CCM team was consulted to assist the patient with chronic disease management and/or care coordination needs related to: Level of Care Concerns.   Engaged with patient by telephone for follow up visit in response to provider referral for social work chronic care management and care coordination services.   Consent to Services:  The patient was given information about Chronic Care Management services, agreed to services, and gave verbal consent prior to initiation of services.  Please see initial visit note for detailed documentation.   Patient agreed to services and consent obtained.   Summary: Patient and caregiver continue to experience difficulty with getting support in the home. Patient was assessed and denied by the PACE program today due to needing a higher level of care. LCSW will collaborate with PCP for ongoing needs, FL2 sent to PCP via [place]..  See Care Plan below for interventions and patient self-care actives.  Recommendation: Patient may benefit from, and sister is in agreement to explore facility placement options.   Follow up Plan: Patient's caregiver would like continued follow-up from CCM LCSW .  per caregiver's request she will call CCM LCSW in one week.     Assessment: Review of patient past medical history, allergies, medications, and health status, including review of relevant consultants reports was performed today as part of a comprehensive evaluation and provision of chronic care management and care coordination services.     SDOH (Social Determinants of Health) assessments and interventions performed:    Advanced Directives Status: See Vynca application for related entries.  CCM Care Plan Conditions to be addressed/monitored: Dementia; Level of  care concerns  Care Plan : LCSW Plan of Care  Updates made by [name], LCSW since 05/30/2022 12:00 AM     Problem: Safety (Wellness)      Goal: Safety Maintained   Start Date: 12/15/2021  This Visit's Progress: On track  Recent Progress: On track  Priority: High  Note:   Current Barriers:  Chronic disease management support and education needs related to  HLD, Osteoporosis, Anxiety/Depression   Critical Safety Needs ; concerns with memory  CSW Clinical Goal(s):  Patient  and Caregiver  explore community resource options discussed during encounter for unmet needs related to:  caregiver support through collaboration with [place] representative, provider, and care team.   Interventions: 1:1 collaboration with primary care provider regarding development and update of comprehensive plan of care as evidenced by provider attestation and co-signature Inter-disciplinary care team collaboration (see longitudinal plan of care) Evaluation of current treatment plan related to  self management and patient's adherence to plan as established by provider  Mental Health:  (Status: New goal. Goal on Track (progressing): YES.) Evaluation of current treatment plan related to Caregiver Stress Solution-Focused Strategies employed:  Active listening / Reflection utilized  Problem [place] strategies reviewed Caregiver stress acknowledged  Other concerns with patient's home ( provider home repair resources)  Level of Care Concerns in a patient with Dementia:  (Status: New goal.) Current level of care: home with other family or significant other(s): significant other Evaluation of patient's unmet needs in current living environment ADL's PACE Program (Patient does not qualify for PACE program after home assessment completed)  Collaborate with PACE coordinator [facility]  Provided a  list of facilities based on level of care needs Discussed payment options for placement  :Provided brochure for special assistance  Discussed Special Assistance Medicaid  Provided educational information "When a loved one needs Long-term Care"  Collaborated with primary care provider for completion of FL2   Patient Self-Care Activities: Continue to work with care team from [facility] medical needs Review resources e-mailed to [place] living options Special Assistance for [place] List for long-term care facilities Link to explore facilities in other counties [email or web address]     Casimer Lanius, [place] Licensed Clinical Social Worker Warehouse manager Primary Care [place]  [phone]

## 2022-06-05 ENCOUNTER — Ambulatory Visit: Payer: Medicare HMO | Admitting: Licensed Clinical Social Worker

## 2022-06-05 DIAGNOSIS — I10 Essential (primary) hypertension: Secondary | ICD-10-CM

## 2022-06-05 DIAGNOSIS — R413 Other amnesia: Secondary | ICD-10-CM

## 2022-06-05 DIAGNOSIS — R4189 Other symptoms and signs involving cognitive functions and awareness: Secondary | ICD-10-CM

## 2022-06-05 NOTE — Patient Instructions (Signed)
Visit Information  Thank you for taking time to visit with me today. Please don't hesitate to contact me if I can be of assistance to you before our next scheduled telephone appointment.  Following are the goals we discussed today:  Patient Self-Care Activities: Continue to work with care team from [facility] medical needs Review resources e-mailed to [place] living options Special Assistance for ALT Call facilities we discussed today and go visit Call Medicaid worker to discuss [employer], [place] Licensed Clinical Social Worker Dossie Arbour Management  [place]  [phone]   No follow up appointment schedules, sister will call LCSW as needed  Please call the care guide team at [phone] if you need to cancel or reschedule your appointment.     Patient verbalizes understanding of instructions and care plan provided today and agrees to view in [place]. Active MyChart status and patient understanding of how to access instructions and care plan via MyChart confirmed with patient.    / (Sister [name])

## 2022-06-05 NOTE — Chronic Care Management (AMB) (Signed)
Chronic Care Management   Clinical Social Work Note  06/05/2022 Name: Michelle [name] MRN: [id number] DOB: Dec 25, 1950  Michelle [name] is a 71 y.o. year old female who is a primary care patient of Michelle Lima, MD. The CCM team was consulted to assist the patient with chronic disease management and/or care coordination needs related to: Level of Care Concerns and Caregiver Stress.   Collaboration with patient's sister  for follow up visit in response to provider referral for social work chronic care management and care coordination services.   Consent to Services:  The patient was given information about Chronic Care Management services, agreed to services, and gave verbal consent prior to initiation of services.  Please see initial visit note for detailed documentation.   Patient agreed to services and consent obtained.   Summary: Patient's sister Michelle [name] provided all information during this encounter..She continues to see decline in patient and has decided to locate a facility near her in [place].  LCSW assisted with locating several facility options for sister to call. She has picked up the FL2 from providers office and is ready to start the process.  See Care Plan below for interventions and patient self-care actives.  Follow up Plan:  No follow up schedule , Patient's sister will call with questions as she starts the process   Assessment: Review of patient past medical history, allergies, medications, and health status, including review of relevant consultants reports was performed today as part of a comprehensive evaluation and provision of chronic care management and care coordination services.     SDOH (Social Determinants of Health) assessments and interventions performed:    Advanced Directives Status: See Vynca application for related entries.  CCM Care Plan Conditions to be addressed/monitored: Dementia; Level of care concerns  Care Plan : LCSW Plan of Care  Updates made by  Michelle Cane, LCSW since 06/05/2022 12:00 AM     Problem: Safety      Goal: Safety Maintained   Start Date: 12/15/2021  This Visit's Progress: On track  Recent Progress: On track  Priority: High  Note:   Current Barriers:  Chronic disease management support and education needs related to  HLD, Osteoporosis, Anxiety/Depression   Critical Safety Needs ; concerns with memory  CSW Clinical Goal(s):  Patient  and Caregiver  explore community resource options discussed during encounter for unmet needs related to:  caregiver support through collaboration with [place] representative, provider, and care team.   Interventions: 1:1 collaboration with primary care provider regarding development and update of comprehensive plan of care as evidenced by provider attestation and co-signature Inter-disciplinary care team collaboration (see longitudinal plan of care) Evaluation of current treatment plan related to  self management and patient's adherence to plan as established by provider  Mental Health:  (Status: New goal. Goal on Track (progressing): YES.) Evaluation of current treatment plan related to Caregiver Stress Solution-Focused Strategies employed:  Active listening / Reflection utilized  Problem [place] strategies reviewed Caregiver stress acknowledged   Level of Care Concerns in a patient with Dementia:  (Status: Goal on Track (progressing): YES.) Current level of care: home with other family or significant other(s): significant other Evaluation of patient's unmet needs in current living environment  Facility  Provided a list of facilities based on level of care needs Discussed payment options for placement :Provided brochure for special assistance  Discussed Special Assistance Medicaid  Provided educational information "When a loved one needs Long-term Care"  Collaborated with primary  care provider for completion of FL2  Caregiver has FL2  Solution-Focused  Strategies employed:  reviewed facility options in [place] where caregiver lives  Patient Self-Care Activities: Continue to work with care team from [employer] medical needs Review resources e-mailed to [place] for ALT Call facilities we discussed today and go visit Call Medicaid worker to discuss [place], [place] Licensed Clinical Social Worker Warehouse manager Primary Care [place]  [phone]

## 2022-06-13 ENCOUNTER — Ambulatory Visit: Payer: Self-pay | Admitting: Licensed Clinical Social Worker

## 2022-06-13 DIAGNOSIS — N1832 Chronic kidney disease, stage 3b: Secondary | ICD-10-CM

## 2022-06-13 DIAGNOSIS — I10 Essential (primary) hypertension: Secondary | ICD-10-CM

## 2022-06-13 DIAGNOSIS — Z7189 Other specified counseling: Secondary | ICD-10-CM

## 2022-06-13 NOTE — Patient Instructions (Signed)
Visit Information  Thank you for taking time to visit with me today. Please don't hesitate to contact me if I can be of assistance to you before our next scheduled telephone appointment.  Following are the goals we discussed today:  facility placement  Patient Self-Care Activities for Care giver: Continue to work with care team from [facility] medical needs Review resources e-mailed to [place] living options Special Assistance for ALT Continue to go visit facilities  Call Medicaid worker to discuss [place], [place] Licensed Clinical Social Worker Dossie Arbour Management  [place]  [phone]    Please call the care guide team at [phone] if you need to cancel or reschedule your appointment.   If you are experiencing a Mental Health or Section or need someone to talk to, please call 1-800-273-TALK (toll free, 24 hour hotline)   Patient's care giver verbalizes understanding of instructions and care plan provided today and agrees to view in [place]. Active MyChart status and patient understanding of how to access instructions and care plan via MyChart confirmed with patient.

## 2022-06-13 NOTE — Chronic Care Management (AMB) (Signed)
Chronic Care Management   Clinical Social Work Note  06/13/2022 Name: Michelle [name] MRN: [id number] DOB: [birth year]  Michelle [name] is a 71 y.o. year old female who is a primary care patient of [name], MD. The CCM team was consulted to assist the patient with chronic disease management and/or care coordination needs related to: Level of Care Concerns.   Collaboration with patient's sister  for follow up visit in response to provider referral for social work chronic care management and care coordination services.   Consent to Services:  The patient was given information about Chronic Care Management services, agreed to services, and gave verbal consent prior to initiation of services.  Please see initial visit note for detailed documentation.   Patient agreed to services and consent obtained.   Summary: Patient's sister Michelle [name] provided all information during this encounter. She has called and visited several facilities, has only located one. LCSW provided education, guidance and support during this encounter.  See Care Plan below for interventions and patient self-care actives.  Recommendation: Patient may benefit from, and sister is in agreement to call Medicaid worker to discuss special assistance to subsidize payment for long-term care needs.   Follow up Plan:  Patient's sister does not desire continued follow-up by CCM LCSW.  She will call LCSW as needed during the placement process    Assessment: Review of patient past medical history, allergies, medications, and health status, including review of relevant consultants reports was performed today as part of a comprehensive evaluation and provision of chronic care management and care coordination services.     SDOH (Social Determinants of Health) assessments and interventions performed:    Advanced Directives Status: See Vynca application for related entries.  CCM Care Plan Conditions to be addressed/monitored: Dementia;  Level of care concerns  Care Plan : LCSW Plan of Care  Updates made by Michelle Cane, LCSW since 06/13/2022 12:00 AM     Problem: Safety      Goal: Safety Maintained   Start Date: 12/15/2021  This Visit's Progress: On track  Recent Progress: On track  Priority: High  Note:   Current Barriers:  Chronic disease management support and education needs related to  HLD, Osteoporosis, Anxiety/Depression   Critical Safety Needs ; concerns with memory  CSW Clinical Goal(s):  Patient  and Caregiver  explore community resource options discussed during encounter for unmet needs related to:  caregiver support through collaboration with [place] representative, provider, and care team.   Interventions: 1:1 collaboration with primary care provider regarding development and update of comprehensive plan of care as evidenced by provider attestation and co-signature Inter-disciplinary care team collaboration (see longitudinal plan of care) Evaluation of current treatment plan related to  self management and patient's adherence to plan as established by provider  Mental Health:  (Status: New goal. Goal on Track (progressing): YES.) Evaluation of current treatment plan related to Caregiver Stress Solution-Focused Strategies employed:  Active listening / Reflection utilized  Problem [place] strategies reviewed Caregiver stress acknowledged   Level of Care Concerns in a patient with Dementia:  (Status: Goal on Track (progressing): YES.) Current level of care: home with other family or significant other(s): significant other Evaluation of patient's unmet needs in current living environment  Facility  Reviewed facility placement process :   Discussed Special Assistance Medicaid  Caregiver has FL2  Solution-Focused Strategies employed:  reviewed facility options in [place] where caregiver lives  Patient Self-Care Activities: Continue to work with  care team from [facility] medical  needs Review resources e-mailed to [place] living options Special Assistance for ALT Continue to go visit facilities  Call Medicaid worker to discuss [place], [place] Licensed Clinical Social Worker Michelle [name] Management  [place]  [phone]

## 2022-06-14 ENCOUNTER — Ambulatory Visit: Payer: Self-pay | Admitting: Licensed Clinical Social Worker

## 2022-06-14 ENCOUNTER — Encounter: Payer: Self-pay | Admitting: Internal Medicine

## 2022-06-14 DIAGNOSIS — Z7189 Other specified counseling: Secondary | ICD-10-CM

## 2022-06-14 DIAGNOSIS — I10 Essential (primary) hypertension: Secondary | ICD-10-CM

## 2022-06-14 DIAGNOSIS — R4189 Other symptoms and signs involving cognitive functions and awareness: Secondary | ICD-10-CM

## 2022-06-14 NOTE — Patient Instructions (Addendum)
Visit Information  Thank you for taking time to visit with me today. Please don't hesitate to contact me if I can be of assistance to you.  Following are the goals we discussed today: level of care for [facility]  Patient Self-Care Activities: Continue to work with care team from [facility] medical needs Review resources e-mailed to [place] living options Special Assistance for ALT Continue to go visit facilities  Call Medicaid worker to discuss [place], [place] Licensed Clinical Social Worker Dossie Arbour Management  [place]  [phone]      Please call the care guide team at [phone] if you need to cancel or reschedule your appointment.     Patient verbalizes understanding of instructions and care plan provided today and agrees to view in [place]. Active MyChart status and patient understanding of how to access instructions and care plan via MyChart confirmed with patient.

## 2022-06-14 NOTE — Chronic Care Management (AMB) (Signed)
Chronic Care Management   Clinical Social Work Note  06/14/2022 Name: Michelle [name] MRN: [id number] DOB: 07-21-1951  Michelle [name] is a 71 y.o. year old female who is a primary care patient of [name], MD. The CCM team was consulted to assist the patient with chronic disease management and/or care coordination needs related to: Level of Care Concerns.   Collaboration with patients sister Michelle [name]  for follow up visit in response to provider referral for social work chronic care management and care coordination services.   Consent to Services:  The patient was given information about Chronic Care Management services, agreed to services, and gave verbal consent prior to initiation of services.  Please see initial visit note for detailed documentation.   Patient agreed to services and consent obtained.   Summary:  Patient's sister Michelle [name] continues to run into barriers with facility placement. She has spoken to DSS for Special Assistance to assist with facility payment. Per Michelle [name] patient's income is too low and she needs to apply for SSI.  Unable to apply for SSI to help with facility payment unless [name]/ sister is rep payee for patient. Per sister the Social Security office needs a letter from PCP stating that patient needs someone to over see her finances  . LCSW will collaborate with PCP  See Care Plan below for interventions and patient self-care actives.  Recommendation: Patient may benefit from, and sister is in agreement to contact PCP to ask if he can provide the letter.   No Follow up Scheduled:  Patient's sister will call as needed   Assessment: Review of patient past medical history, allergies, medications, and health status, including review of relevant consultants reports was performed today as part of a comprehensive evaluation and provision of chronic care management and care coordination services.     SDOH (Social Determinants of Health) assessments and interventions  performed:    Advanced Directives Status: See Vynca application for related entries.  CCM Care Plan Conditions to be addressed/monitored: Dementia; Level of care concerns  Care Plan : LCSW Plan of Care  Updates made by [name], LCSW since 06/14/2022 12:00 AM     Problem: Safety      Goal: Safety Maintained   Start Date: 12/15/2021  This Visit's Progress: On track  Recent Progress: On track  Priority: High  Note:   Current Barriers:  Chronic disease management support and education needs related to  HLD, Osteoporosis, Anxiety/Depression   Critical Safety Needs ; concerns with memory  CSW Clinical Goal(s):  Patient  and Caregiver  explore community resource options discussed during encounter for unmet needs related to:  caregiver support through collaboration with [place] representative, provider, and care team.   Interventions: 1:1 collaboration with primary care provider regarding development and update of comprehensive plan of care as evidenced by provider attestation and co-signature Inter-disciplinary care team collaboration (see longitudinal plan of care) Evaluation of current treatment plan related to  self management and patient's adherence to plan as established by provider  Mental Health:  (Status: New goal. Goal on Track (progressing): YES.) Evaluation of current treatment plan related to Caregiver Stress Solution-Focused Strategies employed:  Active listening / Reflection utilized  Problem [place] strategies reviewed Collaborated with PCP   Level of Care Concerns in a patient with Dementia:  (Status: Goal on Track (progressing): YES.) Current level of care: home with other family or significant other(s): significant other Evaluation of patient's unmet needs in current living environment  Facility  Reviewed facility placement process :   Discussed Special Assistance Medicaid  Caregiver has FL2  Solution-Focused Strategies employed:   reviewed facility options in [place] where caregiver lives  Patient Self-Care Activities: Continue to work with care team from [facility] medical needs Review resources e-mailed to [place] living options Special Assistance for ALT Continue to go visit facilities  Call Medicaid worker to discuss [place], [place] Licensed Clinical Social Worker [employer] [place]  [phone]

## 2022-06-15 DIAGNOSIS — I10 Essential (primary) hypertension: Secondary | ICD-10-CM

## 2022-06-18 ENCOUNTER — Ambulatory Visit (INDEPENDENT_AMBULATORY_CARE_PROVIDER_SITE_OTHER): Payer: Medicare HMO | Admitting: Internal Medicine

## 2022-06-18 ENCOUNTER — Encounter: Payer: Self-pay | Admitting: Internal Medicine

## 2022-06-18 DIAGNOSIS — Q828 Other specified congenital malformations of skin: Secondary | ICD-10-CM

## 2022-06-18 MED ORDER — DOXYCYCLINE HYCLATE 100 MG PO TABS: 100.0000 mg | ORAL_TABLET | Freq: Two times a day (BID) | ORAL | 0 refills | Status: DC

## 2022-06-18 NOTE — Patient Instructions (Signed)
We have sent in doxycycline to take 1 pill twice a day for 2 weeks.   We will see if the neurologist can change the namenda to xr version for once daily.

## 2022-06-18 NOTE — Progress Notes (Signed)
   Subjective:   Patient ID: Michelle [name], female    DOB: April 02, 1951, 71 y.o.   MRN: [id number]  HPI The patient is a 71 YO female coming in for concerns.   Review of Systems  Constitutional: Negative.   HENT: Negative.    Eyes: Negative.   Respiratory:  Negative for cough, chest tightness and shortness of breath.   Cardiovascular:  Negative for chest pain, palpitations and leg swelling.  Gastrointestinal:  Negative for abdominal distention, abdominal pain, constipation, diarrhea, nausea and vomiting.  Musculoskeletal: Negative.   Skin:  Positive for color change, rash and wound.  Neurological: Negative.   Psychiatric/Behavioral: Negative.      Objective:  Physical Exam Constitutional:      Appearance: She is well-developed.  HENT:     Head: Normocephalic and atraumatic.  Cardiovascular:     Rate and Rhythm: Normal rate and regular rhythm.  Pulmonary:     Effort: Pulmonary effort is normal. No respiratory distress.     Breath sounds: Normal breath sounds. No wheezing or rales.  Abdominal:     General: Bowel sounds are normal. There is no distension.     Palpations: Abdomen is soft.     Tenderness: There is no abdominal tenderness. There is no rebound.  Musculoskeletal:     Cervical back: Normal range of motion.  Skin:    General: Skin is warm and dry.     Comments: Left axillary about 1 cm circular lesion with opening and no fluctuance or pocket appreciated.  Neurological:     Mental Status: She is alert and oriented to person, place, and time.     Coordination: Coordination normal.     Vitals:   06/18/22 1433  BP: 118/70  Pulse: 60  Resp: 18  SpO2: 95%  Weight: 106 lb 3.2 oz (48.2 kg)  Height: '5\' 5"'$  (1.651 m)    Assessment & Plan:

## 2022-06-20 ENCOUNTER — Emergency Department (HOSPITAL_COMMUNITY): Payer: Medicare HMO

## 2022-06-20 ENCOUNTER — Encounter (HOSPITAL_COMMUNITY): Payer: Self-pay

## 2022-06-20 ENCOUNTER — Emergency Department (HOSPITAL_COMMUNITY)
Admission: EM | Admit: 2022-06-20 | Discharge: 2022-06-21 | Disposition: A | Discharge status: Home / Self Care | Payer: Medicare HMO | Attending: Emergency Medicine | Admitting: Emergency Medicine

## 2022-06-20 ENCOUNTER — Other Ambulatory Visit: Payer: Self-pay

## 2022-06-20 DIAGNOSIS — R519 Headache, unspecified: Secondary | ICD-10-CM | POA: Diagnosis not present

## 2022-06-20 DIAGNOSIS — S0083XA Contusion of other part of head, initial encounter: Secondary | ICD-10-CM | POA: Insufficient documentation

## 2022-06-20 DIAGNOSIS — Z743 Need for continuous supervision: Secondary | ICD-10-CM | POA: Diagnosis not present

## 2022-06-20 DIAGNOSIS — R404 Transient alteration of awareness: Secondary | ICD-10-CM | POA: Diagnosis not present

## 2022-06-20 DIAGNOSIS — I4510 Unspecified right bundle-branch block: Secondary | ICD-10-CM | POA: Diagnosis not present

## 2022-06-20 DIAGNOSIS — R079 Chest pain, unspecified: Secondary | ICD-10-CM | POA: Diagnosis not present

## 2022-06-20 DIAGNOSIS — W1830XA Fall on same level, unspecified, initial encounter: Secondary | ICD-10-CM | POA: Diagnosis not present

## 2022-06-20 DIAGNOSIS — R55 Syncope and collapse: Secondary | ICD-10-CM | POA: Insufficient documentation

## 2022-06-20 DIAGNOSIS — S0990XA Unspecified injury of head, initial encounter: Secondary | ICD-10-CM | POA: Diagnosis present

## 2022-06-20 DIAGNOSIS — R6889 Other general symptoms and signs: Secondary | ICD-10-CM | POA: Diagnosis not present

## 2022-06-20 LAB — CBC WITH DIFFERENTIAL/PLATELET
Abs Immature Granulocytes: 0.02 10*3/uL (ref 0.00–0.07)
Basophils Absolute: 0.1 10*3/uL (ref 0.0–0.1)
Basophils Relative: 1 %
Eosinophils Absolute: 0.2 10*3/uL (ref 0.0–0.5)
Eosinophils Relative: 3 %
HCT: 38.1 % (ref 36.0–46.0)
Hemoglobin: 11.8 g/dL — ABNORMAL LOW (ref 12.0–15.0)
Immature Granulocytes: 0 %
Lymphocytes Relative: 19 %
Lymphs Abs: 1.5 10*3/uL (ref 0.7–4.0)
MCH: 29.1 pg (ref 26.0–34.0)
MCHC: 31 g/dL (ref 30.0–36.0)
MCV: 93.8 fL (ref 80.0–100.0)
Monocytes Absolute: 0.8 10*3/uL (ref 0.1–1.0)
Monocytes Relative: 10 %
Neutro Abs: 5.5 10*3/uL (ref 1.7–7.7)
Neutrophils Relative %: 67 %
Platelets: 221 10*3/uL (ref 150–400)
RBC: 4.06 MIL/uL (ref 3.87–5.11)
RDW: 13.7 % (ref 11.5–15.5)
WBC: 8.1 10*3/uL (ref 4.0–10.5)
nRBC: 0 % (ref 0.0–0.2)

## 2022-06-20 MED ORDER — SODIUM CHLORIDE 0.9 % IV BOLUS
1000.0000 mL | Freq: Once | INTRAVENOUS | Status: AC
  Administered 2022-06-20: 1000 mL via INTRAVENOUS

## 2022-06-20 NOTE — Assessment & Plan Note (Signed)
She does have new lesion near left groin. No abscess drainable suspect this self drained as sister states smaller today than when she saw this previously and rx doxycycline 1 week course

## 2022-06-20 NOTE — ED Provider Notes (Signed)
[facility] EMERGENCY DEPARTMENT Provider Note   CSN: [id number] Arrival date & time: 06/20/22  2222     History {Add pertinent medical, surgical, social history, OB history to HPI:1} Chief Complaint  Patient presents with   Fall   Loss of Consciousness    Michelle [name] is a 71 y.o. female.  71 yo F with a chief complaint of syncopal episode.  The patient was at home and felt unwell and then she collapsed to the ground.  She feels much better now.  Denies any complaints earlier today.  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  She denies any recent illnesses.  Feels like she has been eating and drinking normally.  Told her friend that she had hit her head and point to different areas about the head.   Fall  Loss of Consciousness      Home Medications Prior to Admission medications   Medication Sig Start Date End Date Taking? Authorizing Provider  acetaminophen (TYLENOL) 325 MG tablet Take 2 tablets (650 mg total) by mouth every 6 (six) hours as needed for mild pain (or Fever >/= 101). 05/08/21   [name], DO  betamethasone valerate (VALISONE) 0.1 % cream Apply 1 application. topically 2 (two) times daily. 03/30/22   [name], MD  calcium-vitamin D (OSCAL WITH D) 500-200 MG-UNIT tablet Take 1 tablet by mouth daily.    [provider]  doxycycline (VIBRA-TABS) 100 MG tablet Take 1 tablet (100 mg total) by mouth 2 (two) times daily. 06/18/22   [name], MD  levothyroxine (SYNTHROID) 50 MCG tablet TAKE ONE TABLET BY MOUTH AT NOON 05/06/22   [name], MD  memantine (NAMENDA) 10 MG tablet Take 1 tablet (10 mg at night) for 2 weeks, then increase to 1 tablet (10 mg) twice a day 04/03/22   [name], [name]  Multiple Vitamin (MULTIVITAMIN) tablet Take 1 tablet by mouth daily.    [provider]  sertraline (ZOLOFT) 100 MG tablet TAKE ONE TABLET BY MOUTH AT NOON  03/12/22   [name], MD  simvastatin (ZOCOR) 10 MG tablet TAKE ONE TABLET BY MOUTH AT NOON 05/06/22   [name], MD      Allergies    Aricept [donepezil] and Penicillins    Review of Systems   Review of Systems  Cardiovascular:  Positive for syncope.    Physical Exam Updated Vital Signs BP (!) 167/87   Pulse (!) 57   Temp 98.4 F (36.9 C)   Resp 18   Ht '5\' 5"'$  (1.651 m)   Wt 48.2 kg   SpO2 100%   BMI 17.68 kg/m  Physical Exam Vitals and nursing note reviewed.  Constitutional:      General: She is not in acute distress.    Appearance: She is well-developed. She is not diaphoretic.  HENT:     Head: Normocephalic.     Comments: Small hematoma to the crown of the head with pinpoint break in the skin. Eyes:     Pupils: Pupils are equal, round, and reactive to light.  Cardiovascular:     Rate and Rhythm: Normal rate and regular rhythm.     Heart sounds: No murmur heard.    No friction rub. No gallop.  Pulmonary:     Effort: Pulmonary effort is normal.     Breath sounds: No wheezing or rales.  Abdominal:  General: There is no distension.     Palpations: Abdomen is soft.     Tenderness: There is no abdominal tenderness.  Musculoskeletal:        General: No tenderness.     Cervical back: Normal range of motion and neck supple.  Skin:    General: Skin is warm and dry.     Comments: Erythema to bilateral lower extremities left greater than right patient states is chronic  Neurological:     Mental Status: She is alert and oriented to person, place, and time.  Psychiatric:        Behavior: Behavior normal.     ED Results / Procedures / Treatments   Labs (all labs ordered are listed, but only abnormal results are displayed) Labs Reviewed  BASIC METABOLIC PANEL  CBC WITH DIFFERENTIAL/PLATELET  MAGNESIUM  TROPONIN I (HIGH SENSITIVITY)    EKG EKG Interpretation  Date/Time:  Wednesday June 20 2022 22:27:24 EDT Ventricular Rate:  59 PR Interval:     QRS Duration: 144 QT Interval:  510 QTC Calculation: 504 R Axis:   83 Text Interpretation: Wide QRS rhythm Right bundle branch block Abnormal ECG When compared with ECG of 30-Jul-2021 19:59, Poor data quality in current ECG precludes serial comparison Confirmed by [name] [phone]) on 06/20/2022 10:39:47 PM  Radiology DG Chest Port 1 View  Result Date: 06/20/2022 CLINICAL DATA:  Recent syncopal episode and chest pain, initial encounter EXAM: PORTABLE CHEST 1 VIEW COMPARISON:  12/04/2021 FINDINGS: Cardiac shadow is stable. Postsurgical changes are again seen. The lungs are well aerated bilaterally. No focal infiltrate or effusion is seen. No bony abnormality is noted. IMPRESSION: No acute abnormality seen. Electronically Signed   By: Inez Catalina M.D.   On: 06/20/2022 23:13    Procedures Procedures  {Document cardiac monitor, telemetry assessment procedure when appropriate:1}  Medications Ordered in ED Medications  sodium chloride 0.9 % bolus 1,000 mL (has no administration in time range)    ED Course/ Medical Decision Making/ A&P                           Medical Decision Making Amount and/or Complexity of Data Reviewed Radiology: ordered.   71 yo F with a chief complaints of a syncopal event.  She was getting ready to feed her cats and felt unwell and collapsed to the ground.  She is back to normal now.  Denies chest pain headache neck pain back pain extremity pain.  We will obtain a laboratory evaluation and CT scan of the head.  Reassess.  {Document critical care time when appropriate:1} {Document review of labs and clinical decision tools ie heart score, Chads2Vasc2 etc:1}  {Document your independent review of radiology images, and any outside records:1} {Document your discussion with family members, caretakers, and with consultants:1} {Document social determinants of health affecting pt's care:1} {Document your decision making why or why not admission, treatments were  needed:1} Final Clinical Impression(s) / ED Diagnoses Final diagnoses:  None    Rx / DC Orders ED Discharge Orders     None

## 2022-06-20 NOTE — ED Triage Notes (Signed)
Pt called ems bc she woke up on the floor. Pt remembers getting dizzy before syncopal episode. Pt had small lac to top of head and head pain. Denies blood thinners. Pt endorsed chest pain with ems and received 324 asa.

## 2022-06-21 ENCOUNTER — Emergency Department (HOSPITAL_COMMUNITY): Payer: Medicare HMO

## 2022-06-21 DIAGNOSIS — S0083XA Contusion of other part of head, initial encounter: Secondary | ICD-10-CM | POA: Diagnosis not present

## 2022-06-21 DIAGNOSIS — R519 Headache, unspecified: Secondary | ICD-10-CM | POA: Diagnosis not present

## 2022-06-21 DIAGNOSIS — R55 Syncope and collapse: Secondary | ICD-10-CM | POA: Diagnosis not present

## 2022-06-21 LAB — BASIC METABOLIC PANEL
Anion gap: 8 (ref 5–15)
BUN: 25 mg/dL — ABNORMAL HIGH (ref 8–23)
CO2: 26 mmol/L (ref 22–32)
Calcium: 8.7 mg/dL — ABNORMAL LOW (ref 8.9–10.3)
Chloride: 105 mmol/L (ref 98–111)
Creatinine, Ser: 1.36 mg/dL — ABNORMAL HIGH (ref 0.44–1.00)
GFR, Estimated: 41 mL/min — ABNORMAL LOW (ref >60)
Glucose, Bld: 91 mg/dL (ref 70–99)
Potassium: 4.5 mmol/L (ref 3.5–5.1)
Sodium: 139 mmol/L (ref 135–145)

## 2022-06-21 LAB — MAGNESIUM: Magnesium: 2.1 mg/dL (ref 1.7–2.4)

## 2022-06-21 LAB — TROPONIN I (HIGH SENSITIVITY): Troponin I (High Sensitivity): 8 ng/L (ref <18)

## 2022-06-21 MED ORDER — FLEET ENEMA 7-19 GM/118ML RE ENEM
1.0000 | ENEMA | Freq: Once | RECTAL | Status: AC
  Administered 2022-06-21: 1 via RECTAL
  Filled 2022-06-21: qty 1

## 2022-06-21 MED ORDER — ACETAMINOPHEN 500 MG PO TABS
1000.0000 mg | ORAL_TABLET | Freq: Once | ORAL | Status: AC
  Administered 2022-06-21: 1000 mg via ORAL
  Filled 2022-06-21: qty 2

## 2022-06-21 NOTE — Discharge Instructions (Signed)
Take 8 scoops of miralax in 32oz of whatever you would like to drink.(Gatorade comes in this size) You can also use a fleets enema which you can buy over the counter at the pharmacy.  Return for worsening abdominal pain, vomiting or fever. ? ?

## 2022-06-21 NOTE — ED Notes (Signed)
Discharge instructions reviewed with patient and family, both verbalized understanding. Patient denies any further questions. Patient discharged home with family.

## 2022-06-22 ENCOUNTER — Telehealth: Payer: Self-pay

## 2022-06-22 NOTE — Telephone Encounter (Signed)
Patient was d/c from [facility] ED on 06/21/22 at 3:53 am for syncope and collapse. RNCM received TOC consult for HHC/DME: PT, OT, HHA, SW. This RNCM spoke with patient's sister Michelle [name] who lives in [place] however helps take care of her sister. Patient has a cousin Michelle [name] that lives 5 minutes away from patient, who checks on her. Patient's sister Michelle [name] has been in contact with Medicaid who will call her on Wednesday. Patient has Medicaid however awaiting assistance with LTC placement. Michelle [name] reports patient makes $774 per month and needs to make $941 per month to get assistance from [facility] Michelle [name] will be out of town on vacation 7/15-7/22 however cousin Michelle [name] will be available.  Patient's sister agrees to this RNCM sending referral for [facility]. This RNCM advised [facility] agency will be in contact within 24-48 hours.

## 2022-06-25 ENCOUNTER — Telehealth: Payer: Self-pay | Admitting: Physician Assistant

## 2022-06-25 NOTE — Telephone Encounter (Signed)
Patient had a bad dizzy spell and fell. She hit her head. ER said everything was ok. Michelle [name] was off the memantine '10mg'$  for a few days and felt better now she is dizzy again. Her sister would like to know if they need to lower her dose or change meds.

## 2022-06-25 NOTE — Telephone Encounter (Signed)
NOTE NOT NEEDED ?

## 2022-06-26 NOTE — Telephone Encounter (Signed)
Advised, will update [place] next week.

## 2022-06-27 ENCOUNTER — Telehealth: Payer: Self-pay

## 2022-06-27 NOTE — Telephone Encounter (Signed)
This RNCM received an inbound call from [facility], patient's sister. [name] states she has been playing phone tag with the [facility] agency: Jackquline [place]. This RNCM notified Anderson Malta w/[employer] to get assistance for patient's [employer] set up.   No additional TOC needs at this time.

## 2022-06-29 NOTE — Telephone Encounter (Signed)
Requesting a pill box to be prescribed  from   [facility] that will dose out meds singularly each day due to pt could be overmedicating,also requesting a waterproof life alert watch

## 2022-07-01 ENCOUNTER — Other Ambulatory Visit: Payer: Self-pay | Admitting: Internal Medicine

## 2022-07-01 DIAGNOSIS — Q828 Other specified congenital malformations of skin: Secondary | ICD-10-CM

## 2022-07-05 ENCOUNTER — Ambulatory Visit: Payer: Self-pay | Admitting: Licensed Clinical Social Worker

## 2022-07-05 DIAGNOSIS — R4189 Other symptoms and signs involving cognitive functions and awareness: Secondary | ICD-10-CM

## 2022-07-05 DIAGNOSIS — Z7189 Other specified counseling: Secondary | ICD-10-CM

## 2022-07-05 NOTE — Patient Instructions (Signed)
Visit Information  Thank you for taking time to visit with me today. Please don't hesitate to contact me if I can be of assistance to you before our next scheduled telephone appointment.  Following are the goals we discussed today:  Continue to work with care team from [facility] medical needs Continue to go visit facilities    [place] next appointment is by telephone on Aug. 21st   Please call the care guide team at [phone] if you need to cancel or reschedule your appointment.   If you are experiencing a Mental Health or [place] or need someone to talk to, please call 1-800-273-TALK (toll free, 24 hour hotline)   Patient verbalizes understanding of instructions and care plan provided today and agrees to view in [place]. Active MyChart status and patient understanding of how to access instructions and care plan via MyChart confirmed with patient.     Casimer Lanius, LCSW Licensed Clinical Social Worker [place]  [phone]

## 2022-07-05 NOTE — Chronic Care Management (AMB) (Signed)
Care Management Clinical Social Work Note  07/05/2022 Name: Michelle [name] MRN: [id number] DOB: Oct 14, 1951  [name] is a 71 y.o. year old female who is a primary care patient of [name], MD.  The Care Management team was consulted for assistance with chronic disease management and coordination needs.  Collaboration with patient's sister  for follow up visit in response to provider referral for social work chronic care management and care coordination services  Consent to Services:  Michelle [name] was given information about Care Management services today including:  Care Management services includes personalized support from designated clinical staff supervised by her physician, including individualized plan of care and coordination with other care providers 24/7 contact phone numbers for assistance for urgent and routine care needs. The patient may stop case management services at any time by phone call to the office staff.  Patient agreed to services and consent obtained.   Summary: Patient's sister [name] provided all information during this encounter. Sister is making progress with managing patient's care. Has home health coming in; has completed application for Social Security increase and continues to look for a facility for long-term care placement .  See Care Plan below for interventions and patient self-care actives.  Follow up Plan:  Patient's caregiver would like continued follow-up from CCM LCSW .  per caregiver's request CCM LCSW will follow up in 4 weeks under Care Coordination.  They will call the office if needed prior to next encounter.   Assessment: Review of patient past medical history, allergies, medications, and health status, including review of relevant consultants reports was performed today as part of a comprehensive evaluation and provision of chronic care management and care coordination services.  SDOH (Social Determinants of Health) assessments and  interventions performed:    Advanced Directives Status: See Vyn[employer] application for related entries.  Care Plan Conditions to be addressed/monitored: Dementia;  Care giver support  Care Plan : LCSW Plan of Care  Updates made by [name], LCSW since 07/05/2022 12:00 AM  Completed 07/05/2022   Problem: Safety Resolved 07/05/2022     Goal: Safety Maintained Completed 07/05/2022  Start Date: 12/15/2021  This Visit's Progress: On track  Recent Progress: On track  Priority: High  Note:   Current Barriers:  Chronic disease management support and education needs related to  HLD, Osteoporosis, Anxiety/Depression   Critical Safety Needs ; concerns with memory  CSW Clinical Goal(s):  Patient  and Caregiver  explore community resource options discussed during encounter for unmet needs related to:  caregiver support through collaboration with [place] representative, provider, and care team.   Interventions: 1:1 collaboration with primary care provider regarding development and update of comprehensive plan of care as evidenced by provider attestation and co-signature Inter-disciplinary care team collaboration (see longitudinal plan of care) Evaluation of current treatment plan related to  self management and patient's adherence to plan as established by provider  Mental Health:  (Status: New goal. Goal on Track (progressing): YES.) Evaluation of current treatment plan related to Caregiver Stress Solution-Focused Strategies employed:  Active listening / Reflection utilized  Problem [place] strategies reviewed Collaborated with PCP   Level of Care Concerns in a patient with Dementia:  (Status: Goal on Track (progressing): YES.) Current level of care: home with other family or significant other(s): significant other Evaluation of patient's unmet needs in current living environment  Facility  Reviewed facility placement process :   Discussed Special Assistance Medicaid   Caregiver has  FL2  Solution-Focused Strategies employed:  reviewed facility options in [place] where caregiver lives  Patient Self-Care Activities: Continue to work with care team from [employer] medical needs Review resources e-mailed to [place] living options Special Assistance for ALT Continue to go visit facilities  Call Medicaid worker to discuss [place], [place] Licensed Clinical Social Worker Engineer, manufacturing [place]  [phone]

## 2022-07-11 ENCOUNTER — Ambulatory Visit: Payer: Medicare HMO | Admitting: Internal Medicine

## 2022-07-12 ENCOUNTER — Telehealth: Payer: Self-pay

## 2022-07-12 NOTE — Telephone Encounter (Signed)
RNCM received inbound call re: [place] services. This RNCM advised patient's sister [name] that her [facility] services are set up with [facility], she would to contact them to set up services.   No TOC needs at this time.

## 2022-07-16 ENCOUNTER — Encounter: Payer: Self-pay | Admitting: Internal Medicine

## 2022-07-16 ENCOUNTER — Ambulatory Visit (INDEPENDENT_AMBULATORY_CARE_PROVIDER_SITE_OTHER): Payer: Medicare HMO | Admitting: Internal Medicine

## 2022-07-16 VITALS — BP 116/68 | HR 56 | Resp 18 | Ht 65.0 in | Wt 107.6 lb

## 2022-07-16 DIAGNOSIS — Q828 Other specified congenital malformations of skin: Secondary | ICD-10-CM

## 2022-07-16 DIAGNOSIS — G44229 Chronic tension-type headache, not intractable: Secondary | ICD-10-CM

## 2022-07-16 MED ORDER — PIMECROLIMUS 1 % EX CREA: TOPICAL_CREAM | Freq: Two times a day (BID) | CUTANEOUS | 3 refills | Status: AC

## 2022-07-16 MED ORDER — UBRELVY 50 MG PO TABS: 50.0000 mg | ORAL_TABLET | Freq: Every day | ORAL | 3 refills | Status: AC | PRN

## 2022-07-16 NOTE — Patient Instructions (Addendum)
We will get you in with a dermatologist.   We have sent in ubrelvy to use for headache to take if needed.   We have sent in the cream to use twice a day on the back and anywhere flaring up.

## 2022-07-16 NOTE — Progress Notes (Signed)
   Subjective:   Patient ID: Michelle [name], female    DOB: 11/02/1951, 71 y.o.   MRN: [id number]  HPI The patient is a 71 YO female coming in for skin problems which are chronic.   Review of Systems  Constitutional: Negative.   HENT: Negative.    Eyes: Negative.   Respiratory:  Negative for cough, chest tightness and shortness of breath.   Cardiovascular:  Negative for chest pain, palpitations and leg swelling.  Gastrointestinal:  Negative for abdominal distention, abdominal pain, constipation, diarrhea, nausea and vomiting.  Musculoskeletal: Negative.   Skin:  Positive for rash.  Neurological: Negative.   Psychiatric/Behavioral: Negative.      Objective:  Physical Exam Constitutional:      Appearance: She is well-developed.  HENT:     Head: Normocephalic and atraumatic.  Cardiovascular:     Rate and Rhythm: Normal rate and regular rhythm.  Pulmonary:     Effort: Pulmonary effort is normal. No respiratory distress.     Breath sounds: Normal breath sounds. No wheezing or rales.  Abdominal:     General: Bowel sounds are normal. There is no distension.     Palpations: Abdomen is soft.     Tenderness: There is no abdominal tenderness. There is no rebound.  Musculoskeletal:     Cervical back: Normal range of motion.  Skin:    General: Skin is warm and dry.     Findings: Rash present.  Neurological:     Mental Status: She is alert and oriented to person, place, and time.     Coordination: Coordination normal.     Vitals:   07/16/22 1449  BP: 116/68  Pulse: (!) 56  Resp: 18  SpO2: 98%  Weight: 107 lb 9.6 oz (48.8 kg)  Height: '5\' 5"'$  (1.651 m)    Assessment & Plan:

## 2022-07-17 NOTE — Assessment & Plan Note (Signed)
Taking tylenol often and likely above safe limits. Rx ubrelvy to use to offset tylenol usage. Could have some component of rebound headaches.

## 2022-07-17 NOTE — Assessment & Plan Note (Signed)
Referral to dermatology and rx elidel to use in the meantime on the back.

## 2022-07-30 ENCOUNTER — Telehealth: Payer: Self-pay | Admitting: Internal Medicine

## 2022-07-30 NOTE — Telephone Encounter (Signed)
[name], a home health nurse, called for verbal orders and to request a referral for the pt.   Michelle [name] is requesting a Environmental consultant do an evaluation on the pt, and is requesting OT as follows: 1X a week for 1 week 2X a week for 2 weeks 1X a week for 1 week to finish  [name] is also requesting we refer the pt to Dr. Casimiro Needle to help manage the pt's dementia and related medications.  States the pt lives alone and is not coping with her condition well.   For any questions please call:  [name]: [phone]

## 2022-07-30 NOTE — Telephone Encounter (Signed)
Verbal orders given for social work eval and OT.   Please advise on Michelle [name]'s request for a referral to Dr. Casimiro Needle.

## 2022-08-02 NOTE — Telephone Encounter (Signed)
Noted  

## 2022-08-06 ENCOUNTER — Ambulatory Visit: Payer: Self-pay | Admitting: Licensed Clinical Social Worker

## 2022-08-06 NOTE — Patient Outreach (Signed)
  Care Coordination  Follow Up Visit Note   08/06/2022 Name: Michelle [name] MRN: [id number] DOB: 08/07/1951  Michelle [name] is a 71 y.o. year old female who sees Michelle Lima, MD for primary care. I spoke with  Michelle [name]'s sister Michelle [name] by phone today  What matters to the patients health and wellness today?  Facility Placement   Sister has located a facility close to her for patient;  [facility] Has an appointment at PCP office for TB skin test and a new FL2   Goals Addressed             This Visit's Progress    COMPLETED: Long term Care Placement       Care Coordination Interventions: Facility Placement: Assessed needs and reviewed facility placement process; as well as the different levels of care Collaborated with primary care provider for completion of FL2  Solution-Focused Strategies employed:  Emotional Support Provided Problem Michelle [name] strategies reviewed       SDOH assessments and interventions completed:  No   Care Coordination Interventions Activated:  Yes  Care Coordination Interventions:  Yes, provided   Follow up plan: No further intervention required. Sister states will call LCSW if needed    Encounter Outcome:  Pt. Visit Completed   Michelle [name], [place] [phone]

## 2022-08-06 NOTE — Patient Instructions (Signed)
Visit Information  Thank you for taking time to talk with me today. Please don't hesitate to contact me if I can be of assistance to you.   Following are the goals we discussed today:   Goals Addressed             This Visit's Progress    COMPLETED: Long term Care Placement       Care Coordination Interventions: Facility Placement: Assessed needs and reviewed facility placement process; as well as the different levels of care Collaborated with primary care provider for completion of FL2  Solution-Focused Strategies employed:  Emotional Support Provided Problem [place] strategies reviewed        Please call the care guide team at [phone] if you need to schedule an appointment with me.   If you are experiencing a Mental Health or [place] or need someone to talk to, please call the Suicide and Crisis Lifeline: 988 call 1-800-273-TALK (toll free, 24 hour hotline)   Patient verbalizes understanding of instructions and care plan provided today and agrees to view in [place]. Active MyChart status and patient understanding of how to access instructions and care plan via MyChart confirmed with patient.     No further follow up required: by Care Coordination team, Sister will call if needed  Casimer Lanius, [place] [phone]

## 2022-08-07 ENCOUNTER — Ambulatory Visit (INDEPENDENT_AMBULATORY_CARE_PROVIDER_SITE_OTHER): Payer: Medicare HMO | Admitting: Internal Medicine

## 2022-08-07 ENCOUNTER — Other Ambulatory Visit: Payer: Self-pay

## 2022-08-07 ENCOUNTER — Encounter: Payer: Self-pay | Admitting: Internal Medicine

## 2022-08-07 VITALS — BP 132/82 | HR 64 | Temp 98.2°F | Resp 18 | Ht 65.0 in | Wt 113.0 lb

## 2022-08-07 DIAGNOSIS — E519 Thiamine deficiency, unspecified: Secondary | ICD-10-CM | POA: Diagnosis not present

## 2022-08-07 DIAGNOSIS — I10 Essential (primary) hypertension: Secondary | ICD-10-CM | POA: Diagnosis not present

## 2022-08-07 DIAGNOSIS — Z0000 Encounter for general adult medical examination without abnormal findings: Secondary | ICD-10-CM | POA: Diagnosis not present

## 2022-08-07 DIAGNOSIS — Z0001 Encounter for general adult medical examination with abnormal findings: Secondary | ICD-10-CM

## 2022-08-07 DIAGNOSIS — E785 Hyperlipidemia, unspecified: Secondary | ICD-10-CM | POA: Diagnosis not present

## 2022-08-07 DIAGNOSIS — Z111 Encounter for screening for respiratory tuberculosis: Secondary | ICD-10-CM | POA: Diagnosis not present

## 2022-08-07 DIAGNOSIS — E039 Hypothyroidism, unspecified: Secondary | ICD-10-CM | POA: Diagnosis not present

## 2022-08-07 LAB — CBC WITH DIFFERENTIAL/PLATELET
Basophils Absolute: 0.1 10*3/uL (ref 0.0–0.1)
Basophils Relative: 0.8 % (ref 0.0–3.0)
Eosinophils Absolute: 0.2 10*3/uL (ref 0.0–0.7)
Eosinophils Relative: 2.8 % (ref 0.0–5.0)
HCT: 36.8 % (ref 36.0–46.0)
Hemoglobin: 12.1 g/dL (ref 12.0–15.0)
Lymphocytes Relative: 19.6 % (ref 12.0–46.0)
Lymphs Abs: 1.4 10*3/uL (ref 0.7–4.0)
MCHC: 33 g/dL (ref 30.0–36.0)
MCV: 90.4 fl (ref 78.0–100.0)
Monocytes Absolute: 0.6 10*3/uL (ref 0.1–1.0)
Monocytes Relative: 7.6 % (ref 3.0–12.0)
Neutro Abs: 5 10*3/uL (ref 1.4–7.7)
Neutrophils Relative %: 69.2 % (ref 43.0–77.0)
Platelets: 190 10*3/uL (ref 150.0–400.0)
RBC: 4.07 Mil/uL (ref 3.87–5.11)
RDW: 14.9 % (ref 11.5–15.5)
WBC: 7.3 10*3/uL (ref 4.0–10.5)

## 2022-08-07 LAB — TSH: TSH: 1.57 u[IU]/mL (ref 0.35–5.50)

## 2022-08-07 NOTE — Progress Notes (Signed)
Subjective:  Patient ID: Michelle [name], female    DOB: 06/11/1951  Age: 71 y.o. MRN: [id number]  CC: Form Completion (Patient states she is here to discuss form completion and TB blood test.), Annual Exam, and Hypothyroidism   HPI Michelle [name] presents for a CPX and f/up -  She is no longer taking Namenda because it caused dizzy spells and made her feel like she was going to fall.  She denies chest pain, shortness of breath, diaphoresis, or edema.  Outpatient Medications Prior to Visit  Medication Sig Dispense Refill   acetaminophen (TYLENOL) 325 MG tablet Take 2 tablets (650 mg total) by mouth every 6 (six) hours as needed for mild pain (or Fever >/= 101). 20 tablet 0   betamethasone valerate (VALISONE) 0.1 % cream APPLY TO THE AFFECTED AREA(S) TOPICALLY TWICE DAILY 90 g 2   calcium-vitamin D (OSCAL WITH D) 500-200 MG-UNIT tablet Take 1 tablet by mouth daily.     levothyroxine (SYNTHROID) 50 MCG tablet TAKE ONE TABLET BY MOUTH AT NOON 90 tablet 1   Multiple Vitamin (MULTIVITAMIN) tablet Take 1 tablet by mouth daily.     pimecrolimus (ELIDEL) 1 % cream Apply topically 2 (two) times daily. 100 g 3   sertraline (ZOLOFT) 100 MG tablet TAKE ONE TABLET BY MOUTH AT NOON 90 tablet 1   simvastatin (ZOCOR) 10 MG tablet TAKE ONE TABLET BY MOUTH AT NOON 90 tablet 1   Ubrogepant (UBRELVY) 50 MG TABS Take 50 mg by mouth daily as needed (headache). 30 tablet 3   memantine (NAMENDA) 10 MG tablet Take 1 tablet (10 mg at night) for 2 weeks, then increase to 1 tablet (10 mg) twice a day (Patient not taking: Reported on 08/07/2022) 60 tablet 11   No facility-administered medications prior to visit.    ROS Review of Systems  Constitutional: Negative.  Negative for diaphoresis and fatigue.  HENT: Negative.    Eyes: Negative.   Respiratory:  Negative for cough, chest tightness, shortness of breath and wheezing.   Cardiovascular:  Negative for chest pain, palpitations and leg swelling.   Gastrointestinal: Negative.  Negative for abdominal pain, diarrhea and nausea.  Endocrine: Negative.  Negative for cold intolerance and heat intolerance.  Genitourinary: Negative.  Negative for difficulty urinating.  Musculoskeletal: Negative.   Skin:  Positive for rash.  Neurological:  Negative for dizziness, weakness and light-headedness.  Hematological:  Negative for adenopathy. Does not bruise/bleed easily.  Psychiatric/Behavioral:  Positive for decreased concentration.     Objective:  BP 132/82   Pulse 64   Temp 98.2 F (36.8 C) (Oral)   Resp 18   Ht '5\' 5"'$  (1.651 m)   Wt 113 lb (51.3 kg)   SpO2 96%   BMI 18.80 kg/m   BP Readings from Last 3 Encounters:  08/07/22 132/82  07/16/22 116/68  06/21/22 (!) 167/92    Wt Readings from Last 3 Encounters:  08/07/22 113 lb (51.3 kg)  07/16/22 107 lb 9.6 oz (48.8 kg)  06/20/22 106 lb 4.2 oz (48.2 kg)    Physical Exam Vitals reviewed.  HENT:     Nose: Nose normal.     Mouth/Throat:     Mouth: Mucous membranes are moist.  Eyes:     General: No scleral icterus.    Conjunctiva/sclera: Conjunctivae normal.  Cardiovascular:     Rate and Rhythm: Normal rate and regular rhythm.     Heart sounds: Murmur heard.     No friction rub. No gallop.  Pulmonary:     Effort: Pulmonary effort is normal.     Breath sounds: No stridor. No wheezing, rhonchi or rales.  Abdominal:     General: Abdomen is flat.     Palpations: There is no mass.     Tenderness: There is no abdominal tenderness. There is no guarding.     Hernia: No hernia is present.  Musculoskeletal:     Cervical back: Neck supple.     Right lower leg: Pitting Edema (trace) present.     Left lower leg: Pitting Edema (trace) present.  Lymphadenopathy:     Cervical: No cervical adenopathy.  Skin:    General: Skin is warm and dry.     Findings: Rash present.  Neurological:     General: No focal deficit present.     Mental Status: She is alert.  Psychiatric:         Behavior: Behavior normal.     Lab Results  Component Value Date   WBC 7.3 08/07/2022   HGB 12.1 08/07/2022   HCT 36.8 08/07/2022   PLT 190.0 08/07/2022   GLUCOSE 91 06/20/2022   CHOL 155 08/07/2022   TRIG 195.0 (H) 08/07/2022   HDL 49.10 08/07/2022   LDLDIRECT 113 (H) 06/16/2008   LDLCALC 67 08/07/2022   ALT 11 08/07/2022   AST 22 08/07/2022   NA 139 06/20/2022   K 4.5 06/20/2022   CL 105 06/20/2022   CREATININE 1.36 (H) 06/20/2022   BUN 25 (H) 06/20/2022   CO2 26 06/20/2022   TSH 1.57 08/07/2022   INR 1.3 (H) 09/22/2020    CT Head Wo Contrast  Result Date: 06/21/2022 CLINICAL DATA:  Recent syncopal episode with headaches, initial encounter EXAM: CT HEAD WITHOUT CONTRAST TECHNIQUE: Contiguous axial images were obtained from the base of the skull through the vertex without intravenous contrast. RADIATION DOSE REDUCTION: This exam was performed according to the departmental dose-optimization program which includes automated exposure control, adjustment of the mA and/or kV according to patient size and/or use of iterative reconstruction technique. COMPARISON:  05/07/2021 FINDINGS: Brain: No evidence of acute infarction, hemorrhage, hydrocephalus, extra-axial collection or mass lesion/mass effect. Atrophic changes are noted with compensatory ventricular dilatation stable in appearance from the prior exam. Scattered areas of chronic white matter ischemic change are again seen. Vascular: No hyperdense vessel or unexpected calcification. Skull: Normal. Negative for fracture or focal lesion. Sinuses/Orbits: Orbits and their contents are within normal limits. Opacification of the right maxillary antrum is noted likely related to sinusitis. Other: None. IMPRESSION: Chronic atrophic and ischemic changes stable from the prior exam. New opacification of the right maxillary antrum. Electronically Signed   By: Inez Catalina M.D.   On: 06/21/2022 01:44   DG Chest Port 1 View  Result Date:  06/20/2022 CLINICAL DATA:  Recent syncopal episode and chest pain, initial encounter EXAM: PORTABLE CHEST 1 VIEW COMPARISON:  12/04/2021 FINDINGS: Cardiac shadow is stable. Postsurgical changes are again seen. The lungs are well aerated bilaterally. No focal infiltrate or effusion is seen. No bony abnormality is noted. IMPRESSION: No acute abnormality seen. Electronically Signed   By: Inez Catalina M.D.   On: 06/20/2022 23:13    Assessment & Plan:   Michelle [name] was seen today for form completion, annual exam and hypothyroidism.  Diagnoses and all orders for this visit:  Essential hypertension- Her blood pressure is adequately well controlled. -     CBC with Differential/Platelet; Future -     CBC with Differential/Platelet  Acquired hypothyroidism-  She is euthyroid. -     TSH; Future -     TSH  Manifestations of thiamine deficiency- Her H&H are normal now. -     CBC with Differential/Platelet; Future -     CBC with Differential/Platelet  Hyperlipidemia with target LDL less than 130- LDL goal achieved. Doing well on the statin  -     Lipid panel; Future -     Hepatic function panel; Future -     Hepatic function panel -     Lipid panel  Screening for tuberculosis -     QuantiFERON-TB Gold Plus; Future -     QuantiFERON-TB Gold Plus   I have discontinued Bonnita Nasuti I. Monts's memantine. I am also having her maintain her acetaminophen, calcium-vitamin D, multivitamin, sertraline, levothyroxine, simvastatin, betamethasone valerate, Ubrelvy, and pimecrolimus.  No orders of the defined types were placed in this encounter.    Follow-up: Return in about 6 months (around 02/07/2023).  [name], MD

## 2022-08-07 NOTE — Patient Instructions (Signed)

## 2022-08-08 LAB — HEPATIC FUNCTION PANEL
ALT: 11 U/L (ref 0–35)
AST: 22 U/L (ref 0–37)
Albumin: 4.1 g/dL (ref 3.5–5.2)
Alkaline Phosphatase: 48 U/L (ref 39–117)
Bilirubin, Direct: 0 mg/dL (ref 0.0–0.3)
Total Bilirubin: 0.3 mg/dL (ref 0.2–1.2)
Total Protein: 6.8 g/dL (ref 6.0–8.3)

## 2022-08-08 LAB — LIPID PANEL
Cholesterol: 155 mg/dL (ref 0–200)
HDL: 49.1 mg/dL (ref >39.00)
LDL Cholesterol: 67 mg/dL (ref 0–99)
NonHDL: 105.83
Total CHOL/HDL Ratio: 3
Triglycerides: 195 mg/dL — ABNORMAL HIGH (ref 0.0–149.0)
VLDL: 39 mg/dL (ref 0.0–40.0)

## 2022-08-10 ENCOUNTER — Encounter: Payer: Self-pay | Admitting: Internal Medicine

## 2022-08-10 DIAGNOSIS — Z0001 Encounter for general adult medical examination with abnormal findings: Secondary | ICD-10-CM | POA: Insufficient documentation

## 2022-08-10 LAB — QUANTIFERON-TB GOLD PLUS
Mitogen-NIL: 9.85 IU/mL
NIL: 0.02 IU/mL
QuantiFERON-TB Gold Plus: NEGATIVE
TB1-NIL: 0 IU/mL
TB2-NIL: 0 IU/mL

## 2022-08-10 MED ORDER — SHINGRIX 50 MCG/0.5ML IM SUSR: 0.5000 mL | Freq: Once | INTRAMUSCULAR | 1 refills | Status: AC

## 2022-08-10 NOTE — Assessment & Plan Note (Signed)
Exam completed Labs reviewed Vaccines reviewed and updated Cancer screenings are up-to-date Patient education was given 

## 2022-08-18 ENCOUNTER — Encounter: Payer: Self-pay | Admitting: Internal Medicine

## 2022-08-23 DIAGNOSIS — I10 Essential (primary) hypertension: Secondary | ICD-10-CM | POA: Diagnosis not present

## 2022-08-23 DIAGNOSIS — F0390 Unspecified dementia without behavioral disturbance: Secondary | ICD-10-CM | POA: Diagnosis not present

## 2022-08-23 DIAGNOSIS — E039 Hypothyroidism, unspecified: Secondary | ICD-10-CM | POA: Diagnosis not present

## 2022-08-23 DIAGNOSIS — F411 Generalized anxiety disorder: Secondary | ICD-10-CM | POA: Diagnosis not present

## 2022-08-23 DIAGNOSIS — E785 Hyperlipidemia, unspecified: Secondary | ICD-10-CM | POA: Diagnosis not present

## 2022-08-23 DIAGNOSIS — F32A Depression, unspecified: Secondary | ICD-10-CM | POA: Diagnosis not present

## 2022-08-27 DIAGNOSIS — F411 Generalized anxiety disorder: Secondary | ICD-10-CM | POA: Diagnosis not present

## 2022-08-27 DIAGNOSIS — I10 Essential (primary) hypertension: Secondary | ICD-10-CM | POA: Diagnosis not present

## 2022-08-27 DIAGNOSIS — R41841 Cognitive communication deficit: Secondary | ICD-10-CM | POA: Diagnosis not present

## 2022-08-27 DIAGNOSIS — M6281 Muscle weakness (generalized): Secondary | ICD-10-CM | POA: Diagnosis not present

## 2022-08-27 DIAGNOSIS — R531 Weakness: Secondary | ICD-10-CM | POA: Diagnosis not present

## 2022-08-27 DIAGNOSIS — R2681 Unsteadiness on feet: Secondary | ICD-10-CM | POA: Diagnosis not present

## 2022-08-27 DIAGNOSIS — R4189 Other symptoms and signs involving cognitive functions and awareness: Secondary | ICD-10-CM | POA: Diagnosis not present

## 2022-08-28 ENCOUNTER — Other Ambulatory Visit: Payer: Self-pay | Admitting: Internal Medicine

## 2022-08-28 DIAGNOSIS — F411 Generalized anxiety disorder: Secondary | ICD-10-CM | POA: Diagnosis not present

## 2022-08-28 DIAGNOSIS — I10 Essential (primary) hypertension: Secondary | ICD-10-CM | POA: Diagnosis not present

## 2022-08-28 DIAGNOSIS — R4189 Other symptoms and signs involving cognitive functions and awareness: Secondary | ICD-10-CM | POA: Diagnosis not present

## 2022-08-28 DIAGNOSIS — F419 Anxiety disorder, unspecified: Secondary | ICD-10-CM

## 2022-08-28 DIAGNOSIS — M6281 Muscle weakness (generalized): Secondary | ICD-10-CM | POA: Diagnosis not present

## 2022-08-28 DIAGNOSIS — R2681 Unsteadiness on feet: Secondary | ICD-10-CM | POA: Diagnosis not present

## 2022-08-28 DIAGNOSIS — R531 Weakness: Secondary | ICD-10-CM | POA: Diagnosis not present

## 2022-08-28 DIAGNOSIS — R41841 Cognitive communication deficit: Secondary | ICD-10-CM | POA: Diagnosis not present

## 2022-08-29 DIAGNOSIS — I10 Essential (primary) hypertension: Secondary | ICD-10-CM | POA: Diagnosis not present

## 2022-08-29 DIAGNOSIS — F411 Generalized anxiety disorder: Secondary | ICD-10-CM | POA: Diagnosis not present

## 2022-08-29 DIAGNOSIS — R4189 Other symptoms and signs involving cognitive functions and awareness: Secondary | ICD-10-CM | POA: Diagnosis not present

## 2022-08-29 DIAGNOSIS — R2681 Unsteadiness on feet: Secondary | ICD-10-CM | POA: Diagnosis not present

## 2022-08-29 DIAGNOSIS — R41841 Cognitive communication deficit: Secondary | ICD-10-CM | POA: Diagnosis not present

## 2022-08-29 DIAGNOSIS — M6281 Muscle weakness (generalized): Secondary | ICD-10-CM | POA: Diagnosis not present

## 2022-08-29 DIAGNOSIS — R531 Weakness: Secondary | ICD-10-CM | POA: Diagnosis not present

## 2022-08-30 DIAGNOSIS — R2681 Unsteadiness on feet: Secondary | ICD-10-CM | POA: Diagnosis not present

## 2022-08-30 DIAGNOSIS — R238 Other skin changes: Secondary | ICD-10-CM | POA: Diagnosis not present

## 2022-08-30 DIAGNOSIS — R41841 Cognitive communication deficit: Secondary | ICD-10-CM | POA: Diagnosis not present

## 2022-08-30 DIAGNOSIS — F411 Generalized anxiety disorder: Secondary | ICD-10-CM | POA: Diagnosis not present

## 2022-08-30 DIAGNOSIS — L600 Ingrowing nail: Secondary | ICD-10-CM | POA: Diagnosis not present

## 2022-08-30 DIAGNOSIS — M6281 Muscle weakness (generalized): Secondary | ICD-10-CM | POA: Diagnosis not present

## 2022-08-30 DIAGNOSIS — R4189 Other symptoms and signs involving cognitive functions and awareness: Secondary | ICD-10-CM | POA: Diagnosis not present

## 2022-08-30 DIAGNOSIS — B351 Tinea unguium: Secondary | ICD-10-CM | POA: Diagnosis not present

## 2022-08-30 DIAGNOSIS — R2689 Other abnormalities of gait and mobility: Secondary | ICD-10-CM | POA: Diagnosis not present

## 2022-08-30 DIAGNOSIS — R531 Weakness: Secondary | ICD-10-CM | POA: Diagnosis not present

## 2022-08-30 DIAGNOSIS — I10 Essential (primary) hypertension: Secondary | ICD-10-CM | POA: Diagnosis not present

## 2022-08-30 DIAGNOSIS — R262 Difficulty in walking, not elsewhere classified: Secondary | ICD-10-CM | POA: Diagnosis not present

## 2022-08-31 DIAGNOSIS — I10 Essential (primary) hypertension: Secondary | ICD-10-CM | POA: Diagnosis not present

## 2022-08-31 DIAGNOSIS — R4189 Other symptoms and signs involving cognitive functions and awareness: Secondary | ICD-10-CM | POA: Diagnosis not present

## 2022-08-31 DIAGNOSIS — F411 Generalized anxiety disorder: Secondary | ICD-10-CM | POA: Diagnosis not present

## 2022-08-31 DIAGNOSIS — R41841 Cognitive communication deficit: Secondary | ICD-10-CM | POA: Diagnosis not present

## 2022-08-31 DIAGNOSIS — M6281 Muscle weakness (generalized): Secondary | ICD-10-CM | POA: Diagnosis not present

## 2022-08-31 DIAGNOSIS — R531 Weakness: Secondary | ICD-10-CM | POA: Diagnosis not present

## 2022-08-31 DIAGNOSIS — R2681 Unsteadiness on feet: Secondary | ICD-10-CM | POA: Diagnosis not present

## 2022-09-03 DIAGNOSIS — M6281 Muscle weakness (generalized): Secondary | ICD-10-CM | POA: Diagnosis not present

## 2022-09-03 DIAGNOSIS — I10 Essential (primary) hypertension: Secondary | ICD-10-CM | POA: Diagnosis not present

## 2022-09-03 DIAGNOSIS — R2681 Unsteadiness on feet: Secondary | ICD-10-CM | POA: Diagnosis not present

## 2022-09-03 DIAGNOSIS — R41841 Cognitive communication deficit: Secondary | ICD-10-CM | POA: Diagnosis not present

## 2022-09-03 DIAGNOSIS — R4189 Other symptoms and signs involving cognitive functions and awareness: Secondary | ICD-10-CM | POA: Diagnosis not present

## 2022-09-03 DIAGNOSIS — R531 Weakness: Secondary | ICD-10-CM | POA: Diagnosis not present

## 2022-09-03 DIAGNOSIS — F411 Generalized anxiety disorder: Secondary | ICD-10-CM | POA: Diagnosis not present

## 2022-09-04 DIAGNOSIS — R531 Weakness: Secondary | ICD-10-CM | POA: Diagnosis not present

## 2022-09-04 DIAGNOSIS — F0283 Dementia in other diseases classified elsewhere, unspecified severity, with mood disturbance: Secondary | ICD-10-CM | POA: Diagnosis not present

## 2022-09-04 DIAGNOSIS — M6281 Muscle weakness (generalized): Secondary | ICD-10-CM | POA: Diagnosis not present

## 2022-09-04 DIAGNOSIS — I10 Essential (primary) hypertension: Secondary | ICD-10-CM | POA: Diagnosis not present

## 2022-09-04 DIAGNOSIS — R2681 Unsteadiness on feet: Secondary | ICD-10-CM | POA: Diagnosis not present

## 2022-09-04 DIAGNOSIS — R4189 Other symptoms and signs involving cognitive functions and awareness: Secondary | ICD-10-CM | POA: Diagnosis not present

## 2022-09-04 DIAGNOSIS — F32A Depression, unspecified: Secondary | ICD-10-CM | POA: Diagnosis not present

## 2022-09-04 DIAGNOSIS — R41841 Cognitive communication deficit: Secondary | ICD-10-CM | POA: Diagnosis not present

## 2022-09-04 DIAGNOSIS — F411 Generalized anxiety disorder: Secondary | ICD-10-CM | POA: Diagnosis not present

## 2022-09-04 DIAGNOSIS — F419 Anxiety disorder, unspecified: Secondary | ICD-10-CM | POA: Diagnosis not present

## 2022-09-05 DIAGNOSIS — R4189 Other symptoms and signs involving cognitive functions and awareness: Secondary | ICD-10-CM | POA: Diagnosis not present

## 2022-09-05 DIAGNOSIS — I10 Essential (primary) hypertension: Secondary | ICD-10-CM | POA: Diagnosis not present

## 2022-09-05 DIAGNOSIS — R531 Weakness: Secondary | ICD-10-CM | POA: Diagnosis not present

## 2022-09-05 DIAGNOSIS — M6281 Muscle weakness (generalized): Secondary | ICD-10-CM | POA: Diagnosis not present

## 2022-09-05 DIAGNOSIS — R2681 Unsteadiness on feet: Secondary | ICD-10-CM | POA: Diagnosis not present

## 2022-09-05 DIAGNOSIS — R41841 Cognitive communication deficit: Secondary | ICD-10-CM | POA: Diagnosis not present

## 2022-09-05 DIAGNOSIS — Z79899 Other long term (current) drug therapy: Secondary | ICD-10-CM | POA: Diagnosis not present

## 2022-09-05 DIAGNOSIS — F411 Generalized anxiety disorder: Secondary | ICD-10-CM | POA: Diagnosis not present

## 2022-09-06 DIAGNOSIS — M6281 Muscle weakness (generalized): Secondary | ICD-10-CM | POA: Diagnosis not present

## 2022-09-06 DIAGNOSIS — R4189 Other symptoms and signs involving cognitive functions and awareness: Secondary | ICD-10-CM | POA: Diagnosis not present

## 2022-09-06 DIAGNOSIS — R2681 Unsteadiness on feet: Secondary | ICD-10-CM | POA: Diagnosis not present

## 2022-09-06 DIAGNOSIS — R531 Weakness: Secondary | ICD-10-CM | POA: Diagnosis not present

## 2022-09-06 DIAGNOSIS — F411 Generalized anxiety disorder: Secondary | ICD-10-CM | POA: Diagnosis not present

## 2022-09-06 DIAGNOSIS — I10 Essential (primary) hypertension: Secondary | ICD-10-CM | POA: Diagnosis not present

## 2022-09-06 DIAGNOSIS — R41841 Cognitive communication deficit: Secondary | ICD-10-CM | POA: Diagnosis not present

## 2022-09-07 DIAGNOSIS — R4189 Other symptoms and signs involving cognitive functions and awareness: Secondary | ICD-10-CM | POA: Diagnosis not present

## 2022-09-07 DIAGNOSIS — R2681 Unsteadiness on feet: Secondary | ICD-10-CM | POA: Diagnosis not present

## 2022-09-07 DIAGNOSIS — R531 Weakness: Secondary | ICD-10-CM | POA: Diagnosis not present

## 2022-09-07 DIAGNOSIS — M6281 Muscle weakness (generalized): Secondary | ICD-10-CM | POA: Diagnosis not present

## 2022-09-07 DIAGNOSIS — I10 Essential (primary) hypertension: Secondary | ICD-10-CM | POA: Diagnosis not present

## 2022-09-07 DIAGNOSIS — F411 Generalized anxiety disorder: Secondary | ICD-10-CM | POA: Diagnosis not present

## 2022-09-07 DIAGNOSIS — R41841 Cognitive communication deficit: Secondary | ICD-10-CM | POA: Diagnosis not present

## 2022-09-10 ENCOUNTER — Telehealth: Payer: Medicare HMO

## 2022-09-10 DIAGNOSIS — R531 Weakness: Secondary | ICD-10-CM | POA: Diagnosis not present

## 2022-09-10 DIAGNOSIS — I10 Essential (primary) hypertension: Secondary | ICD-10-CM | POA: Diagnosis not present

## 2022-09-10 DIAGNOSIS — R4189 Other symptoms and signs involving cognitive functions and awareness: Secondary | ICD-10-CM | POA: Diagnosis not present

## 2022-09-10 DIAGNOSIS — R41841 Cognitive communication deficit: Secondary | ICD-10-CM | POA: Diagnosis not present

## 2022-09-10 DIAGNOSIS — R2681 Unsteadiness on feet: Secondary | ICD-10-CM | POA: Diagnosis not present

## 2022-09-10 DIAGNOSIS — F411 Generalized anxiety disorder: Secondary | ICD-10-CM | POA: Diagnosis not present

## 2022-09-10 DIAGNOSIS — M6281 Muscle weakness (generalized): Secondary | ICD-10-CM | POA: Diagnosis not present

## 2022-09-11 DIAGNOSIS — R4189 Other symptoms and signs involving cognitive functions and awareness: Secondary | ICD-10-CM | POA: Diagnosis not present

## 2022-09-11 DIAGNOSIS — I10 Essential (primary) hypertension: Secondary | ICD-10-CM | POA: Diagnosis not present

## 2022-09-11 DIAGNOSIS — R2681 Unsteadiness on feet: Secondary | ICD-10-CM | POA: Diagnosis not present

## 2022-09-11 DIAGNOSIS — R41841 Cognitive communication deficit: Secondary | ICD-10-CM | POA: Diagnosis not present

## 2022-09-11 DIAGNOSIS — M6281 Muscle weakness (generalized): Secondary | ICD-10-CM | POA: Diagnosis not present

## 2022-09-11 DIAGNOSIS — R531 Weakness: Secondary | ICD-10-CM | POA: Diagnosis not present

## 2022-09-11 DIAGNOSIS — F411 Generalized anxiety disorder: Secondary | ICD-10-CM | POA: Diagnosis not present

## 2022-09-12 DIAGNOSIS — R2681 Unsteadiness on feet: Secondary | ICD-10-CM | POA: Diagnosis not present

## 2022-09-12 DIAGNOSIS — R4189 Other symptoms and signs involving cognitive functions and awareness: Secondary | ICD-10-CM | POA: Diagnosis not present

## 2022-09-12 DIAGNOSIS — R531 Weakness: Secondary | ICD-10-CM | POA: Diagnosis not present

## 2022-09-12 DIAGNOSIS — F411 Generalized anxiety disorder: Secondary | ICD-10-CM | POA: Diagnosis not present

## 2022-09-12 DIAGNOSIS — I10 Essential (primary) hypertension: Secondary | ICD-10-CM | POA: Diagnosis not present

## 2022-09-12 DIAGNOSIS — R41841 Cognitive communication deficit: Secondary | ICD-10-CM | POA: Diagnosis not present

## 2022-09-12 DIAGNOSIS — M6281 Muscle weakness (generalized): Secondary | ICD-10-CM | POA: Diagnosis not present

## 2022-09-13 DIAGNOSIS — R531 Weakness: Secondary | ICD-10-CM | POA: Diagnosis not present

## 2022-09-13 DIAGNOSIS — R4189 Other symptoms and signs involving cognitive functions and awareness: Secondary | ICD-10-CM | POA: Diagnosis not present

## 2022-09-13 DIAGNOSIS — I10 Essential (primary) hypertension: Secondary | ICD-10-CM | POA: Diagnosis not present

## 2022-09-13 DIAGNOSIS — M6281 Muscle weakness (generalized): Secondary | ICD-10-CM | POA: Diagnosis not present

## 2022-09-13 DIAGNOSIS — R2681 Unsteadiness on feet: Secondary | ICD-10-CM | POA: Diagnosis not present

## 2022-09-13 DIAGNOSIS — F411 Generalized anxiety disorder: Secondary | ICD-10-CM | POA: Diagnosis not present

## 2022-09-13 DIAGNOSIS — R41841 Cognitive communication deficit: Secondary | ICD-10-CM | POA: Diagnosis not present

## 2022-09-14 DIAGNOSIS — R531 Weakness: Secondary | ICD-10-CM | POA: Diagnosis not present

## 2022-09-14 DIAGNOSIS — R41841 Cognitive communication deficit: Secondary | ICD-10-CM | POA: Diagnosis not present

## 2022-09-14 DIAGNOSIS — R2681 Unsteadiness on feet: Secondary | ICD-10-CM | POA: Diagnosis not present

## 2022-09-14 DIAGNOSIS — M6281 Muscle weakness (generalized): Secondary | ICD-10-CM | POA: Diagnosis not present

## 2022-09-14 DIAGNOSIS — R4189 Other symptoms and signs involving cognitive functions and awareness: Secondary | ICD-10-CM | POA: Diagnosis not present

## 2022-09-14 DIAGNOSIS — I10 Essential (primary) hypertension: Secondary | ICD-10-CM | POA: Diagnosis not present

## 2022-09-14 DIAGNOSIS — F411 Generalized anxiety disorder: Secondary | ICD-10-CM | POA: Diagnosis not present

## 2022-09-17 DIAGNOSIS — F411 Generalized anxiety disorder: Secondary | ICD-10-CM | POA: Diagnosis not present

## 2022-09-17 DIAGNOSIS — M6281 Muscle weakness (generalized): Secondary | ICD-10-CM | POA: Diagnosis not present

## 2022-09-17 DIAGNOSIS — R531 Weakness: Secondary | ICD-10-CM | POA: Diagnosis not present

## 2022-09-17 DIAGNOSIS — R41841 Cognitive communication deficit: Secondary | ICD-10-CM | POA: Diagnosis not present

## 2022-09-17 DIAGNOSIS — I10 Essential (primary) hypertension: Secondary | ICD-10-CM | POA: Diagnosis not present

## 2022-09-17 DIAGNOSIS — R2681 Unsteadiness on feet: Secondary | ICD-10-CM | POA: Diagnosis not present

## 2022-09-17 DIAGNOSIS — R4189 Other symptoms and signs involving cognitive functions and awareness: Secondary | ICD-10-CM | POA: Diagnosis not present

## 2022-09-18 DIAGNOSIS — F411 Generalized anxiety disorder: Secondary | ICD-10-CM | POA: Diagnosis not present

## 2022-09-18 DIAGNOSIS — R2681 Unsteadiness on feet: Secondary | ICD-10-CM | POA: Diagnosis not present

## 2022-09-18 DIAGNOSIS — R531 Weakness: Secondary | ICD-10-CM | POA: Diagnosis not present

## 2022-09-18 DIAGNOSIS — R41841 Cognitive communication deficit: Secondary | ICD-10-CM | POA: Diagnosis not present

## 2022-09-18 DIAGNOSIS — M6281 Muscle weakness (generalized): Secondary | ICD-10-CM | POA: Diagnosis not present

## 2022-09-18 DIAGNOSIS — I10 Essential (primary) hypertension: Secondary | ICD-10-CM | POA: Diagnosis not present

## 2022-09-18 DIAGNOSIS — R4189 Other symptoms and signs involving cognitive functions and awareness: Secondary | ICD-10-CM | POA: Diagnosis not present

## 2022-09-19 DIAGNOSIS — R4189 Other symptoms and signs involving cognitive functions and awareness: Secondary | ICD-10-CM | POA: Diagnosis not present

## 2022-09-19 DIAGNOSIS — R2681 Unsteadiness on feet: Secondary | ICD-10-CM | POA: Diagnosis not present

## 2022-09-19 DIAGNOSIS — F411 Generalized anxiety disorder: Secondary | ICD-10-CM | POA: Diagnosis not present

## 2022-09-19 DIAGNOSIS — R41841 Cognitive communication deficit: Secondary | ICD-10-CM | POA: Diagnosis not present

## 2022-09-19 DIAGNOSIS — I10 Essential (primary) hypertension: Secondary | ICD-10-CM | POA: Diagnosis not present

## 2022-09-19 DIAGNOSIS — M6281 Muscle weakness (generalized): Secondary | ICD-10-CM | POA: Diagnosis not present

## 2022-09-19 DIAGNOSIS — R531 Weakness: Secondary | ICD-10-CM | POA: Diagnosis not present

## 2022-09-20 DIAGNOSIS — R531 Weakness: Secondary | ICD-10-CM | POA: Diagnosis not present

## 2022-09-20 DIAGNOSIS — F411 Generalized anxiety disorder: Secondary | ICD-10-CM | POA: Diagnosis not present

## 2022-09-20 DIAGNOSIS — M6281 Muscle weakness (generalized): Secondary | ICD-10-CM | POA: Diagnosis not present

## 2022-09-20 DIAGNOSIS — R2681 Unsteadiness on feet: Secondary | ICD-10-CM | POA: Diagnosis not present

## 2022-09-20 DIAGNOSIS — R4189 Other symptoms and signs involving cognitive functions and awareness: Secondary | ICD-10-CM | POA: Diagnosis not present

## 2022-09-20 DIAGNOSIS — R41841 Cognitive communication deficit: Secondary | ICD-10-CM | POA: Diagnosis not present

## 2022-09-20 DIAGNOSIS — I10 Essential (primary) hypertension: Secondary | ICD-10-CM | POA: Diagnosis not present

## 2022-09-21 DIAGNOSIS — R531 Weakness: Secondary | ICD-10-CM | POA: Diagnosis not present

## 2022-09-21 DIAGNOSIS — I10 Essential (primary) hypertension: Secondary | ICD-10-CM | POA: Diagnosis not present

## 2022-09-21 DIAGNOSIS — M6281 Muscle weakness (generalized): Secondary | ICD-10-CM | POA: Diagnosis not present

## 2022-09-21 DIAGNOSIS — R41841 Cognitive communication deficit: Secondary | ICD-10-CM | POA: Diagnosis not present

## 2022-09-21 DIAGNOSIS — R4189 Other symptoms and signs involving cognitive functions and awareness: Secondary | ICD-10-CM | POA: Diagnosis not present

## 2022-09-21 DIAGNOSIS — R2681 Unsteadiness on feet: Secondary | ICD-10-CM | POA: Diagnosis not present

## 2022-09-21 DIAGNOSIS — F411 Generalized anxiety disorder: Secondary | ICD-10-CM | POA: Diagnosis not present

## 2022-09-24 DIAGNOSIS — I10 Essential (primary) hypertension: Secondary | ICD-10-CM | POA: Diagnosis not present

## 2022-09-24 DIAGNOSIS — R531 Weakness: Secondary | ICD-10-CM | POA: Diagnosis not present

## 2022-09-24 DIAGNOSIS — R41841 Cognitive communication deficit: Secondary | ICD-10-CM | POA: Diagnosis not present

## 2022-09-24 DIAGNOSIS — R2681 Unsteadiness on feet: Secondary | ICD-10-CM | POA: Diagnosis not present

## 2022-09-24 DIAGNOSIS — M6281 Muscle weakness (generalized): Secondary | ICD-10-CM | POA: Diagnosis not present

## 2022-09-24 DIAGNOSIS — R4189 Other symptoms and signs involving cognitive functions and awareness: Secondary | ICD-10-CM | POA: Diagnosis not present

## 2022-09-24 DIAGNOSIS — F411 Generalized anxiety disorder: Secondary | ICD-10-CM | POA: Diagnosis not present

## 2022-09-25 DIAGNOSIS — R531 Weakness: Secondary | ICD-10-CM | POA: Diagnosis not present

## 2022-09-25 DIAGNOSIS — I10 Essential (primary) hypertension: Secondary | ICD-10-CM | POA: Diagnosis not present

## 2022-09-25 DIAGNOSIS — R41841 Cognitive communication deficit: Secondary | ICD-10-CM | POA: Diagnosis not present

## 2022-09-25 DIAGNOSIS — R4189 Other symptoms and signs involving cognitive functions and awareness: Secondary | ICD-10-CM | POA: Diagnosis not present

## 2022-09-25 DIAGNOSIS — F411 Generalized anxiety disorder: Secondary | ICD-10-CM | POA: Diagnosis not present

## 2022-09-25 DIAGNOSIS — R2681 Unsteadiness on feet: Secondary | ICD-10-CM | POA: Diagnosis not present

## 2022-09-25 DIAGNOSIS — M6281 Muscle weakness (generalized): Secondary | ICD-10-CM | POA: Diagnosis not present

## 2022-09-26 ENCOUNTER — Other Ambulatory Visit: Payer: Self-pay | Admitting: Internal Medicine

## 2022-09-26 DIAGNOSIS — R531 Weakness: Secondary | ICD-10-CM | POA: Diagnosis not present

## 2022-09-26 DIAGNOSIS — R4189 Other symptoms and signs involving cognitive functions and awareness: Secondary | ICD-10-CM | POA: Diagnosis not present

## 2022-09-26 DIAGNOSIS — M6281 Muscle weakness (generalized): Secondary | ICD-10-CM | POA: Diagnosis not present

## 2022-09-26 DIAGNOSIS — I10 Essential (primary) hypertension: Secondary | ICD-10-CM | POA: Diagnosis not present

## 2022-09-26 DIAGNOSIS — F411 Generalized anxiety disorder: Secondary | ICD-10-CM | POA: Diagnosis not present

## 2022-09-26 DIAGNOSIS — R41841 Cognitive communication deficit: Secondary | ICD-10-CM | POA: Diagnosis not present

## 2022-09-26 DIAGNOSIS — R2681 Unsteadiness on feet: Secondary | ICD-10-CM | POA: Diagnosis not present

## 2022-09-26 DIAGNOSIS — Q828 Other specified congenital malformations of skin: Secondary | ICD-10-CM

## 2022-09-27 DIAGNOSIS — R4189 Other symptoms and signs involving cognitive functions and awareness: Secondary | ICD-10-CM | POA: Diagnosis not present

## 2022-09-27 DIAGNOSIS — M6281 Muscle weakness (generalized): Secondary | ICD-10-CM | POA: Diagnosis not present

## 2022-09-27 DIAGNOSIS — R2681 Unsteadiness on feet: Secondary | ICD-10-CM | POA: Diagnosis not present

## 2022-09-27 DIAGNOSIS — R531 Weakness: Secondary | ICD-10-CM | POA: Diagnosis not present

## 2022-09-27 DIAGNOSIS — F411 Generalized anxiety disorder: Secondary | ICD-10-CM | POA: Diagnosis not present

## 2022-09-27 DIAGNOSIS — I10 Essential (primary) hypertension: Secondary | ICD-10-CM | POA: Diagnosis not present

## 2022-09-27 DIAGNOSIS — R41841 Cognitive communication deficit: Secondary | ICD-10-CM | POA: Diagnosis not present

## 2022-10-01 DIAGNOSIS — R41841 Cognitive communication deficit: Secondary | ICD-10-CM | POA: Diagnosis not present

## 2022-10-01 DIAGNOSIS — R531 Weakness: Secondary | ICD-10-CM | POA: Diagnosis not present

## 2022-10-01 DIAGNOSIS — R4189 Other symptoms and signs involving cognitive functions and awareness: Secondary | ICD-10-CM | POA: Diagnosis not present

## 2022-10-01 DIAGNOSIS — F411 Generalized anxiety disorder: Secondary | ICD-10-CM | POA: Diagnosis not present

## 2022-10-01 DIAGNOSIS — R2681 Unsteadiness on feet: Secondary | ICD-10-CM | POA: Diagnosis not present

## 2022-10-01 DIAGNOSIS — I10 Essential (primary) hypertension: Secondary | ICD-10-CM | POA: Diagnosis not present

## 2022-10-01 DIAGNOSIS — M6281 Muscle weakness (generalized): Secondary | ICD-10-CM | POA: Diagnosis not present

## 2022-10-02 ENCOUNTER — Ambulatory Visit: Payer: Medicare HMO | Admitting: Internal Medicine

## 2022-10-02 DIAGNOSIS — F411 Generalized anxiety disorder: Secondary | ICD-10-CM | POA: Diagnosis not present

## 2022-10-02 DIAGNOSIS — R41841 Cognitive communication deficit: Secondary | ICD-10-CM | POA: Diagnosis not present

## 2022-10-02 DIAGNOSIS — I10 Essential (primary) hypertension: Secondary | ICD-10-CM | POA: Diagnosis not present

## 2022-10-02 DIAGNOSIS — M6281 Muscle weakness (generalized): Secondary | ICD-10-CM | POA: Diagnosis not present

## 2022-10-02 DIAGNOSIS — R2681 Unsteadiness on feet: Secondary | ICD-10-CM | POA: Diagnosis not present

## 2022-10-02 DIAGNOSIS — R4189 Other symptoms and signs involving cognitive functions and awareness: Secondary | ICD-10-CM | POA: Diagnosis not present

## 2022-10-02 DIAGNOSIS — R531 Weakness: Secondary | ICD-10-CM | POA: Diagnosis not present

## 2022-10-03 DIAGNOSIS — R531 Weakness: Secondary | ICD-10-CM | POA: Diagnosis not present

## 2022-10-03 DIAGNOSIS — R4189 Other symptoms and signs involving cognitive functions and awareness: Secondary | ICD-10-CM | POA: Diagnosis not present

## 2022-10-03 DIAGNOSIS — R41841 Cognitive communication deficit: Secondary | ICD-10-CM | POA: Diagnosis not present

## 2022-10-03 DIAGNOSIS — M6281 Muscle weakness (generalized): Secondary | ICD-10-CM | POA: Diagnosis not present

## 2022-10-03 DIAGNOSIS — R2681 Unsteadiness on feet: Secondary | ICD-10-CM | POA: Diagnosis not present

## 2022-10-03 DIAGNOSIS — I10 Essential (primary) hypertension: Secondary | ICD-10-CM | POA: Diagnosis not present

## 2022-10-03 DIAGNOSIS — F411 Generalized anxiety disorder: Secondary | ICD-10-CM | POA: Diagnosis not present

## 2022-10-04 DIAGNOSIS — I10 Essential (primary) hypertension: Secondary | ICD-10-CM | POA: Diagnosis not present

## 2022-10-04 DIAGNOSIS — R41841 Cognitive communication deficit: Secondary | ICD-10-CM | POA: Diagnosis not present

## 2022-10-04 DIAGNOSIS — R531 Weakness: Secondary | ICD-10-CM | POA: Diagnosis not present

## 2022-10-04 DIAGNOSIS — R4189 Other symptoms and signs involving cognitive functions and awareness: Secondary | ICD-10-CM | POA: Diagnosis not present

## 2022-10-04 DIAGNOSIS — M6281 Muscle weakness (generalized): Secondary | ICD-10-CM | POA: Diagnosis not present

## 2022-10-04 DIAGNOSIS — F411 Generalized anxiety disorder: Secondary | ICD-10-CM | POA: Diagnosis not present

## 2022-10-04 DIAGNOSIS — R2681 Unsteadiness on feet: Secondary | ICD-10-CM | POA: Diagnosis not present

## 2022-10-05 DIAGNOSIS — I10 Essential (primary) hypertension: Secondary | ICD-10-CM | POA: Diagnosis not present

## 2022-10-05 DIAGNOSIS — R41841 Cognitive communication deficit: Secondary | ICD-10-CM | POA: Diagnosis not present

## 2022-10-05 DIAGNOSIS — R531 Weakness: Secondary | ICD-10-CM | POA: Diagnosis not present

## 2022-10-05 DIAGNOSIS — F411 Generalized anxiety disorder: Secondary | ICD-10-CM | POA: Diagnosis not present

## 2022-10-05 DIAGNOSIS — R2681 Unsteadiness on feet: Secondary | ICD-10-CM | POA: Diagnosis not present

## 2022-10-05 DIAGNOSIS — M6281 Muscle weakness (generalized): Secondary | ICD-10-CM | POA: Diagnosis not present

## 2022-10-05 DIAGNOSIS — R4189 Other symptoms and signs involving cognitive functions and awareness: Secondary | ICD-10-CM | POA: Diagnosis not present

## 2022-10-08 DIAGNOSIS — R41841 Cognitive communication deficit: Secondary | ICD-10-CM | POA: Diagnosis not present

## 2022-10-08 DIAGNOSIS — I10 Essential (primary) hypertension: Secondary | ICD-10-CM | POA: Diagnosis not present

## 2022-10-08 DIAGNOSIS — F411 Generalized anxiety disorder: Secondary | ICD-10-CM | POA: Diagnosis not present

## 2022-10-08 DIAGNOSIS — R4189 Other symptoms and signs involving cognitive functions and awareness: Secondary | ICD-10-CM | POA: Diagnosis not present

## 2022-10-08 DIAGNOSIS — R2681 Unsteadiness on feet: Secondary | ICD-10-CM | POA: Diagnosis not present

## 2022-10-08 DIAGNOSIS — R531 Weakness: Secondary | ICD-10-CM | POA: Diagnosis not present

## 2022-10-08 DIAGNOSIS — M6281 Muscle weakness (generalized): Secondary | ICD-10-CM | POA: Diagnosis not present

## 2022-10-09 DIAGNOSIS — I10 Essential (primary) hypertension: Secondary | ICD-10-CM | POA: Diagnosis not present

## 2022-10-09 DIAGNOSIS — M6281 Muscle weakness (generalized): Secondary | ICD-10-CM | POA: Diagnosis not present

## 2022-10-09 DIAGNOSIS — F411 Generalized anxiety disorder: Secondary | ICD-10-CM | POA: Diagnosis not present

## 2022-10-09 DIAGNOSIS — R2681 Unsteadiness on feet: Secondary | ICD-10-CM | POA: Diagnosis not present

## 2022-10-09 DIAGNOSIS — R41841 Cognitive communication deficit: Secondary | ICD-10-CM | POA: Diagnosis not present

## 2022-10-09 DIAGNOSIS — R4189 Other symptoms and signs involving cognitive functions and awareness: Secondary | ICD-10-CM | POA: Diagnosis not present

## 2022-10-09 DIAGNOSIS — R531 Weakness: Secondary | ICD-10-CM | POA: Diagnosis not present

## 2022-10-10 DIAGNOSIS — R4189 Other symptoms and signs involving cognitive functions and awareness: Secondary | ICD-10-CM | POA: Diagnosis not present

## 2022-10-10 DIAGNOSIS — R41841 Cognitive communication deficit: Secondary | ICD-10-CM | POA: Diagnosis not present

## 2022-10-10 DIAGNOSIS — R2681 Unsteadiness on feet: Secondary | ICD-10-CM | POA: Diagnosis not present

## 2022-10-10 DIAGNOSIS — I10 Essential (primary) hypertension: Secondary | ICD-10-CM | POA: Diagnosis not present

## 2022-10-10 DIAGNOSIS — R531 Weakness: Secondary | ICD-10-CM | POA: Diagnosis not present

## 2022-10-10 DIAGNOSIS — M6281 Muscle weakness (generalized): Secondary | ICD-10-CM | POA: Diagnosis not present

## 2022-10-10 DIAGNOSIS — F411 Generalized anxiety disorder: Secondary | ICD-10-CM | POA: Diagnosis not present

## 2022-10-11 DIAGNOSIS — R4189 Other symptoms and signs involving cognitive functions and awareness: Secondary | ICD-10-CM | POA: Diagnosis not present

## 2022-10-11 DIAGNOSIS — I10 Essential (primary) hypertension: Secondary | ICD-10-CM | POA: Diagnosis not present

## 2022-10-11 DIAGNOSIS — F411 Generalized anxiety disorder: Secondary | ICD-10-CM | POA: Diagnosis not present

## 2022-10-11 DIAGNOSIS — R531 Weakness: Secondary | ICD-10-CM | POA: Diagnosis not present

## 2022-10-11 DIAGNOSIS — R2681 Unsteadiness on feet: Secondary | ICD-10-CM | POA: Diagnosis not present

## 2022-10-11 DIAGNOSIS — M6281 Muscle weakness (generalized): Secondary | ICD-10-CM | POA: Diagnosis not present

## 2022-10-11 DIAGNOSIS — R41841 Cognitive communication deficit: Secondary | ICD-10-CM | POA: Diagnosis not present

## 2022-10-12 DIAGNOSIS — F411 Generalized anxiety disorder: Secondary | ICD-10-CM | POA: Diagnosis not present

## 2022-10-12 DIAGNOSIS — R4189 Other symptoms and signs involving cognitive functions and awareness: Secondary | ICD-10-CM | POA: Diagnosis not present

## 2022-10-12 DIAGNOSIS — R531 Weakness: Secondary | ICD-10-CM | POA: Diagnosis not present

## 2022-10-12 DIAGNOSIS — M6281 Muscle weakness (generalized): Secondary | ICD-10-CM | POA: Diagnosis not present

## 2022-10-12 DIAGNOSIS — R2681 Unsteadiness on feet: Secondary | ICD-10-CM | POA: Diagnosis not present

## 2022-10-12 DIAGNOSIS — I10 Essential (primary) hypertension: Secondary | ICD-10-CM | POA: Diagnosis not present

## 2022-10-12 DIAGNOSIS — R41841 Cognitive communication deficit: Secondary | ICD-10-CM | POA: Diagnosis not present

## 2022-10-14 DIAGNOSIS — R2681 Unsteadiness on feet: Secondary | ICD-10-CM | POA: Diagnosis not present

## 2022-10-14 DIAGNOSIS — I10 Essential (primary) hypertension: Secondary | ICD-10-CM | POA: Diagnosis not present

## 2022-10-14 DIAGNOSIS — R531 Weakness: Secondary | ICD-10-CM | POA: Diagnosis not present

## 2022-10-14 DIAGNOSIS — R41841 Cognitive communication deficit: Secondary | ICD-10-CM | POA: Diagnosis not present

## 2022-10-14 DIAGNOSIS — F411 Generalized anxiety disorder: Secondary | ICD-10-CM | POA: Diagnosis not present

## 2022-10-14 DIAGNOSIS — M6281 Muscle weakness (generalized): Secondary | ICD-10-CM | POA: Diagnosis not present

## 2022-10-14 DIAGNOSIS — R4189 Other symptoms and signs involving cognitive functions and awareness: Secondary | ICD-10-CM | POA: Diagnosis not present

## 2022-10-15 DIAGNOSIS — F331 Major depressive disorder, recurrent, moderate: Secondary | ICD-10-CM | POA: Diagnosis not present

## 2022-10-15 DIAGNOSIS — F411 Generalized anxiety disorder: Secondary | ICD-10-CM | POA: Diagnosis not present

## 2022-10-15 DIAGNOSIS — R4189 Other symptoms and signs involving cognitive functions and awareness: Secondary | ICD-10-CM | POA: Diagnosis not present

## 2022-10-15 DIAGNOSIS — F419 Anxiety disorder, unspecified: Secondary | ICD-10-CM | POA: Diagnosis not present

## 2022-10-15 DIAGNOSIS — R41841 Cognitive communication deficit: Secondary | ICD-10-CM | POA: Diagnosis not present

## 2022-10-15 DIAGNOSIS — R2681 Unsteadiness on feet: Secondary | ICD-10-CM | POA: Diagnosis not present

## 2022-10-15 DIAGNOSIS — M6281 Muscle weakness (generalized): Secondary | ICD-10-CM | POA: Diagnosis not present

## 2022-10-15 DIAGNOSIS — I10 Essential (primary) hypertension: Secondary | ICD-10-CM | POA: Diagnosis not present

## 2022-10-16 DIAGNOSIS — R4189 Other symptoms and signs involving cognitive functions and awareness: Secondary | ICD-10-CM | POA: Diagnosis not present

## 2022-10-16 DIAGNOSIS — M6281 Muscle weakness (generalized): Secondary | ICD-10-CM | POA: Diagnosis not present

## 2022-10-16 DIAGNOSIS — F411 Generalized anxiety disorder: Secondary | ICD-10-CM | POA: Diagnosis not present

## 2022-10-16 DIAGNOSIS — R2681 Unsteadiness on feet: Secondary | ICD-10-CM | POA: Diagnosis not present

## 2022-10-16 DIAGNOSIS — I10 Essential (primary) hypertension: Secondary | ICD-10-CM | POA: Diagnosis not present

## 2022-10-16 DIAGNOSIS — R41841 Cognitive communication deficit: Secondary | ICD-10-CM | POA: Diagnosis not present

## 2022-10-17 DIAGNOSIS — F411 Generalized anxiety disorder: Secondary | ICD-10-CM | POA: Diagnosis not present

## 2022-10-17 DIAGNOSIS — R531 Weakness: Secondary | ICD-10-CM | POA: Diagnosis not present

## 2022-10-17 DIAGNOSIS — R41841 Cognitive communication deficit: Secondary | ICD-10-CM | POA: Diagnosis not present

## 2022-10-17 DIAGNOSIS — R2681 Unsteadiness on feet: Secondary | ICD-10-CM | POA: Diagnosis not present

## 2022-10-17 DIAGNOSIS — M6281 Muscle weakness (generalized): Secondary | ICD-10-CM | POA: Diagnosis not present

## 2022-10-17 DIAGNOSIS — R4189 Other symptoms and signs involving cognitive functions and awareness: Secondary | ICD-10-CM | POA: Diagnosis not present

## 2022-10-17 DIAGNOSIS — I10 Essential (primary) hypertension: Secondary | ICD-10-CM | POA: Diagnosis not present

## 2022-10-18 DIAGNOSIS — R2681 Unsteadiness on feet: Secondary | ICD-10-CM | POA: Diagnosis not present

## 2022-10-18 DIAGNOSIS — M6281 Muscle weakness (generalized): Secondary | ICD-10-CM | POA: Diagnosis not present

## 2022-10-18 DIAGNOSIS — F411 Generalized anxiety disorder: Secondary | ICD-10-CM | POA: Diagnosis not present

## 2022-10-18 DIAGNOSIS — R531 Weakness: Secondary | ICD-10-CM | POA: Diagnosis not present

## 2022-10-18 DIAGNOSIS — R4189 Other symptoms and signs involving cognitive functions and awareness: Secondary | ICD-10-CM | POA: Diagnosis not present

## 2022-10-18 DIAGNOSIS — I10 Essential (primary) hypertension: Secondary | ICD-10-CM | POA: Diagnosis not present

## 2022-10-18 DIAGNOSIS — R41841 Cognitive communication deficit: Secondary | ICD-10-CM | POA: Diagnosis not present

## 2022-10-19 DIAGNOSIS — R41841 Cognitive communication deficit: Secondary | ICD-10-CM | POA: Diagnosis not present

## 2022-10-19 DIAGNOSIS — R4189 Other symptoms and signs involving cognitive functions and awareness: Secondary | ICD-10-CM | POA: Diagnosis not present

## 2022-10-19 DIAGNOSIS — F411 Generalized anxiety disorder: Secondary | ICD-10-CM | POA: Diagnosis not present

## 2022-10-19 DIAGNOSIS — I10 Essential (primary) hypertension: Secondary | ICD-10-CM | POA: Diagnosis not present

## 2022-10-19 DIAGNOSIS — R2681 Unsteadiness on feet: Secondary | ICD-10-CM | POA: Diagnosis not present

## 2022-10-19 DIAGNOSIS — M6281 Muscle weakness (generalized): Secondary | ICD-10-CM | POA: Diagnosis not present

## 2022-10-19 DIAGNOSIS — R531 Weakness: Secondary | ICD-10-CM | POA: Diagnosis not present

## 2022-10-23 ENCOUNTER — Ambulatory Visit: Payer: Medicare HMO | Admitting: Physician Assistant

## 2022-10-23 DIAGNOSIS — I10 Essential (primary) hypertension: Secondary | ICD-10-CM | POA: Diagnosis not present

## 2022-10-23 DIAGNOSIS — M6281 Muscle weakness (generalized): Secondary | ICD-10-CM | POA: Diagnosis not present

## 2022-10-23 DIAGNOSIS — R4189 Other symptoms and signs involving cognitive functions and awareness: Secondary | ICD-10-CM | POA: Diagnosis not present

## 2022-10-23 DIAGNOSIS — R41841 Cognitive communication deficit: Secondary | ICD-10-CM | POA: Diagnosis not present

## 2022-10-23 DIAGNOSIS — R2681 Unsteadiness on feet: Secondary | ICD-10-CM | POA: Diagnosis not present

## 2022-10-23 DIAGNOSIS — R531 Weakness: Secondary | ICD-10-CM | POA: Diagnosis not present

## 2022-10-23 DIAGNOSIS — F411 Generalized anxiety disorder: Secondary | ICD-10-CM | POA: Diagnosis not present

## 2022-10-24 DIAGNOSIS — M6281 Muscle weakness (generalized): Secondary | ICD-10-CM | POA: Diagnosis not present

## 2022-10-24 DIAGNOSIS — R4189 Other symptoms and signs involving cognitive functions and awareness: Secondary | ICD-10-CM | POA: Diagnosis not present

## 2022-10-24 DIAGNOSIS — R2681 Unsteadiness on feet: Secondary | ICD-10-CM | POA: Diagnosis not present

## 2022-10-24 DIAGNOSIS — F411 Generalized anxiety disorder: Secondary | ICD-10-CM | POA: Diagnosis not present

## 2022-10-24 DIAGNOSIS — R531 Weakness: Secondary | ICD-10-CM | POA: Diagnosis not present

## 2022-10-24 DIAGNOSIS — I10 Essential (primary) hypertension: Secondary | ICD-10-CM | POA: Diagnosis not present

## 2022-10-24 DIAGNOSIS — R41841 Cognitive communication deficit: Secondary | ICD-10-CM | POA: Diagnosis not present

## 2022-10-26 DIAGNOSIS — F411 Generalized anxiety disorder: Secondary | ICD-10-CM | POA: Diagnosis not present

## 2022-10-26 DIAGNOSIS — R4189 Other symptoms and signs involving cognitive functions and awareness: Secondary | ICD-10-CM | POA: Diagnosis not present

## 2022-10-26 DIAGNOSIS — R2681 Unsteadiness on feet: Secondary | ICD-10-CM | POA: Diagnosis not present

## 2022-10-26 DIAGNOSIS — R531 Weakness: Secondary | ICD-10-CM | POA: Diagnosis not present

## 2022-10-26 DIAGNOSIS — R41841 Cognitive communication deficit: Secondary | ICD-10-CM | POA: Diagnosis not present

## 2022-10-26 DIAGNOSIS — I10 Essential (primary) hypertension: Secondary | ICD-10-CM | POA: Diagnosis not present

## 2022-10-26 DIAGNOSIS — M6281 Muscle weakness (generalized): Secondary | ICD-10-CM | POA: Diagnosis not present

## 2022-10-29 DIAGNOSIS — R2681 Unsteadiness on feet: Secondary | ICD-10-CM | POA: Diagnosis not present

## 2022-10-29 DIAGNOSIS — L600 Ingrowing nail: Secondary | ICD-10-CM | POA: Diagnosis not present

## 2022-10-29 DIAGNOSIS — R41841 Cognitive communication deficit: Secondary | ICD-10-CM | POA: Diagnosis not present

## 2022-10-29 DIAGNOSIS — R4189 Other symptoms and signs involving cognitive functions and awareness: Secondary | ICD-10-CM | POA: Diagnosis not present

## 2022-10-29 DIAGNOSIS — B351 Tinea unguium: Secondary | ICD-10-CM | POA: Diagnosis not present

## 2022-10-29 DIAGNOSIS — I10 Essential (primary) hypertension: Secondary | ICD-10-CM | POA: Diagnosis not present

## 2022-10-29 DIAGNOSIS — R262 Difficulty in walking, not elsewhere classified: Secondary | ICD-10-CM | POA: Diagnosis not present

## 2022-10-29 DIAGNOSIS — R531 Weakness: Secondary | ICD-10-CM | POA: Diagnosis not present

## 2022-10-29 DIAGNOSIS — R2689 Other abnormalities of gait and mobility: Secondary | ICD-10-CM | POA: Diagnosis not present

## 2022-10-29 DIAGNOSIS — F411 Generalized anxiety disorder: Secondary | ICD-10-CM | POA: Diagnosis not present

## 2022-10-29 DIAGNOSIS — R238 Other skin changes: Secondary | ICD-10-CM | POA: Diagnosis not present

## 2022-10-29 DIAGNOSIS — M6281 Muscle weakness (generalized): Secondary | ICD-10-CM | POA: Diagnosis not present

## 2022-10-30 ENCOUNTER — Other Ambulatory Visit: Payer: Medicare HMO

## 2022-10-30 DIAGNOSIS — R41841 Cognitive communication deficit: Secondary | ICD-10-CM | POA: Diagnosis not present

## 2022-10-30 DIAGNOSIS — M6281 Muscle weakness (generalized): Secondary | ICD-10-CM | POA: Diagnosis not present

## 2022-10-30 DIAGNOSIS — F411 Generalized anxiety disorder: Secondary | ICD-10-CM | POA: Diagnosis not present

## 2022-10-30 DIAGNOSIS — R4189 Other symptoms and signs involving cognitive functions and awareness: Secondary | ICD-10-CM | POA: Diagnosis not present

## 2022-10-30 DIAGNOSIS — R531 Weakness: Secondary | ICD-10-CM | POA: Diagnosis not present

## 2022-10-30 DIAGNOSIS — R2681 Unsteadiness on feet: Secondary | ICD-10-CM | POA: Diagnosis not present

## 2022-10-30 DIAGNOSIS — I10 Essential (primary) hypertension: Secondary | ICD-10-CM | POA: Diagnosis not present

## 2022-10-31 DIAGNOSIS — I10 Essential (primary) hypertension: Secondary | ICD-10-CM | POA: Diagnosis not present

## 2022-10-31 DIAGNOSIS — R41841 Cognitive communication deficit: Secondary | ICD-10-CM | POA: Diagnosis not present

## 2022-10-31 DIAGNOSIS — M6281 Muscle weakness (generalized): Secondary | ICD-10-CM | POA: Diagnosis not present

## 2022-10-31 DIAGNOSIS — F411 Generalized anxiety disorder: Secondary | ICD-10-CM | POA: Diagnosis not present

## 2022-10-31 DIAGNOSIS — R531 Weakness: Secondary | ICD-10-CM | POA: Diagnosis not present

## 2022-10-31 DIAGNOSIS — R4189 Other symptoms and signs involving cognitive functions and awareness: Secondary | ICD-10-CM | POA: Diagnosis not present

## 2022-10-31 DIAGNOSIS — R2681 Unsteadiness on feet: Secondary | ICD-10-CM | POA: Diagnosis not present

## 2022-11-01 DIAGNOSIS — R531 Weakness: Secondary | ICD-10-CM | POA: Diagnosis not present

## 2022-11-01 DIAGNOSIS — I10 Essential (primary) hypertension: Secondary | ICD-10-CM | POA: Diagnosis not present

## 2022-11-01 DIAGNOSIS — R41841 Cognitive communication deficit: Secondary | ICD-10-CM | POA: Diagnosis not present

## 2022-11-01 DIAGNOSIS — R4189 Other symptoms and signs involving cognitive functions and awareness: Secondary | ICD-10-CM | POA: Diagnosis not present

## 2022-11-01 DIAGNOSIS — R2681 Unsteadiness on feet: Secondary | ICD-10-CM | POA: Diagnosis not present

## 2022-11-01 DIAGNOSIS — F411 Generalized anxiety disorder: Secondary | ICD-10-CM | POA: Diagnosis not present

## 2022-11-01 DIAGNOSIS — M6281 Muscle weakness (generalized): Secondary | ICD-10-CM | POA: Diagnosis not present

## 2022-11-02 DIAGNOSIS — R41841 Cognitive communication deficit: Secondary | ICD-10-CM | POA: Diagnosis not present

## 2022-11-02 DIAGNOSIS — F411 Generalized anxiety disorder: Secondary | ICD-10-CM | POA: Diagnosis not present

## 2022-11-02 DIAGNOSIS — I10 Essential (primary) hypertension: Secondary | ICD-10-CM | POA: Diagnosis not present

## 2022-11-02 DIAGNOSIS — R531 Weakness: Secondary | ICD-10-CM | POA: Diagnosis not present

## 2022-11-02 DIAGNOSIS — R2681 Unsteadiness on feet: Secondary | ICD-10-CM | POA: Diagnosis not present

## 2022-11-02 DIAGNOSIS — M6281 Muscle weakness (generalized): Secondary | ICD-10-CM | POA: Diagnosis not present

## 2022-11-02 DIAGNOSIS — R4189 Other symptoms and signs involving cognitive functions and awareness: Secondary | ICD-10-CM | POA: Diagnosis not present

## 2022-11-06 DIAGNOSIS — F419 Anxiety disorder, unspecified: Secondary | ICD-10-CM | POA: Diagnosis not present

## 2022-11-06 DIAGNOSIS — F331 Major depressive disorder, recurrent, moderate: Secondary | ICD-10-CM | POA: Diagnosis not present

## 2022-11-06 DIAGNOSIS — R41841 Cognitive communication deficit: Secondary | ICD-10-CM | POA: Diagnosis not present

## 2022-11-06 DIAGNOSIS — F411 Generalized anxiety disorder: Secondary | ICD-10-CM | POA: Diagnosis not present

## 2022-11-06 DIAGNOSIS — R4189 Other symptoms and signs involving cognitive functions and awareness: Secondary | ICD-10-CM | POA: Diagnosis not present

## 2022-11-06 DIAGNOSIS — R531 Weakness: Secondary | ICD-10-CM | POA: Diagnosis not present

## 2022-11-06 DIAGNOSIS — M6281 Muscle weakness (generalized): Secondary | ICD-10-CM | POA: Diagnosis not present

## 2022-11-06 DIAGNOSIS — R2681 Unsteadiness on feet: Secondary | ICD-10-CM | POA: Diagnosis not present

## 2022-11-06 DIAGNOSIS — I10 Essential (primary) hypertension: Secondary | ICD-10-CM | POA: Diagnosis not present

## 2022-11-07 DIAGNOSIS — R2681 Unsteadiness on feet: Secondary | ICD-10-CM | POA: Diagnosis not present

## 2022-11-07 DIAGNOSIS — I10 Essential (primary) hypertension: Secondary | ICD-10-CM | POA: Diagnosis not present

## 2022-11-07 DIAGNOSIS — R4189 Other symptoms and signs involving cognitive functions and awareness: Secondary | ICD-10-CM | POA: Diagnosis not present

## 2022-11-07 DIAGNOSIS — F411 Generalized anxiety disorder: Secondary | ICD-10-CM | POA: Diagnosis not present

## 2022-11-07 DIAGNOSIS — R41841 Cognitive communication deficit: Secondary | ICD-10-CM | POA: Diagnosis not present

## 2022-11-07 DIAGNOSIS — R531 Weakness: Secondary | ICD-10-CM | POA: Diagnosis not present

## 2022-11-07 DIAGNOSIS — M6281 Muscle weakness (generalized): Secondary | ICD-10-CM | POA: Diagnosis not present

## 2022-11-12 DIAGNOSIS — R531 Weakness: Secondary | ICD-10-CM | POA: Diagnosis not present

## 2022-11-12 DIAGNOSIS — M6281 Muscle weakness (generalized): Secondary | ICD-10-CM | POA: Diagnosis not present

## 2022-11-12 DIAGNOSIS — I10 Essential (primary) hypertension: Secondary | ICD-10-CM | POA: Diagnosis not present

## 2022-11-12 DIAGNOSIS — R2681 Unsteadiness on feet: Secondary | ICD-10-CM | POA: Diagnosis not present

## 2022-11-12 DIAGNOSIS — F411 Generalized anxiety disorder: Secondary | ICD-10-CM | POA: Diagnosis not present

## 2022-11-12 DIAGNOSIS — R41841 Cognitive communication deficit: Secondary | ICD-10-CM | POA: Diagnosis not present

## 2022-11-12 DIAGNOSIS — R4189 Other symptoms and signs involving cognitive functions and awareness: Secondary | ICD-10-CM | POA: Diagnosis not present

## 2022-11-13 DIAGNOSIS — R2681 Unsteadiness on feet: Secondary | ICD-10-CM | POA: Diagnosis not present

## 2022-11-13 DIAGNOSIS — R531 Weakness: Secondary | ICD-10-CM | POA: Diagnosis not present

## 2022-11-13 DIAGNOSIS — F411 Generalized anxiety disorder: Secondary | ICD-10-CM | POA: Diagnosis not present

## 2022-11-13 DIAGNOSIS — R4189 Other symptoms and signs involving cognitive functions and awareness: Secondary | ICD-10-CM | POA: Diagnosis not present

## 2022-11-13 DIAGNOSIS — I10 Essential (primary) hypertension: Secondary | ICD-10-CM | POA: Diagnosis not present

## 2022-11-13 DIAGNOSIS — R41841 Cognitive communication deficit: Secondary | ICD-10-CM | POA: Diagnosis not present

## 2022-11-13 DIAGNOSIS — M6281 Muscle weakness (generalized): Secondary | ICD-10-CM | POA: Diagnosis not present

## 2022-11-14 DIAGNOSIS — M6281 Muscle weakness (generalized): Secondary | ICD-10-CM | POA: Diagnosis not present

## 2022-11-14 DIAGNOSIS — R4189 Other symptoms and signs involving cognitive functions and awareness: Secondary | ICD-10-CM | POA: Diagnosis not present

## 2022-11-14 DIAGNOSIS — F411 Generalized anxiety disorder: Secondary | ICD-10-CM | POA: Diagnosis not present

## 2022-11-14 DIAGNOSIS — I10 Essential (primary) hypertension: Secondary | ICD-10-CM | POA: Diagnosis not present

## 2022-11-14 DIAGNOSIS — R41841 Cognitive communication deficit: Secondary | ICD-10-CM | POA: Diagnosis not present

## 2022-11-14 DIAGNOSIS — R531 Weakness: Secondary | ICD-10-CM | POA: Diagnosis not present

## 2022-11-14 DIAGNOSIS — R2681 Unsteadiness on feet: Secondary | ICD-10-CM | POA: Diagnosis not present

## 2022-11-16 DIAGNOSIS — I10 Essential (primary) hypertension: Secondary | ICD-10-CM | POA: Diagnosis not present

## 2022-11-16 DIAGNOSIS — F411 Generalized anxiety disorder: Secondary | ICD-10-CM | POA: Diagnosis not present

## 2022-11-16 DIAGNOSIS — R4189 Other symptoms and signs involving cognitive functions and awareness: Secondary | ICD-10-CM | POA: Diagnosis not present

## 2022-11-16 DIAGNOSIS — R41841 Cognitive communication deficit: Secondary | ICD-10-CM | POA: Diagnosis not present

## 2022-11-19 DIAGNOSIS — R4189 Other symptoms and signs involving cognitive functions and awareness: Secondary | ICD-10-CM | POA: Diagnosis not present

## 2022-11-19 DIAGNOSIS — I10 Essential (primary) hypertension: Secondary | ICD-10-CM | POA: Diagnosis not present

## 2022-11-19 DIAGNOSIS — R2681 Unsteadiness on feet: Secondary | ICD-10-CM | POA: Diagnosis not present

## 2022-11-19 DIAGNOSIS — F411 Generalized anxiety disorder: Secondary | ICD-10-CM | POA: Diagnosis not present

## 2022-11-19 DIAGNOSIS — R41841 Cognitive communication deficit: Secondary | ICD-10-CM | POA: Diagnosis not present

## 2022-11-19 DIAGNOSIS — M6281 Muscle weakness (generalized): Secondary | ICD-10-CM | POA: Diagnosis not present

## 2022-11-21 DIAGNOSIS — R2681 Unsteadiness on feet: Secondary | ICD-10-CM | POA: Diagnosis not present

## 2022-11-21 DIAGNOSIS — R41841 Cognitive communication deficit: Secondary | ICD-10-CM | POA: Diagnosis not present

## 2022-11-21 DIAGNOSIS — F411 Generalized anxiety disorder: Secondary | ICD-10-CM | POA: Diagnosis not present

## 2022-11-21 DIAGNOSIS — I10 Essential (primary) hypertension: Secondary | ICD-10-CM | POA: Diagnosis not present

## 2022-11-21 DIAGNOSIS — M6281 Muscle weakness (generalized): Secondary | ICD-10-CM | POA: Diagnosis not present

## 2022-11-21 DIAGNOSIS — R4189 Other symptoms and signs involving cognitive functions and awareness: Secondary | ICD-10-CM | POA: Diagnosis not present

## 2022-11-22 DIAGNOSIS — I10 Essential (primary) hypertension: Secondary | ICD-10-CM | POA: Diagnosis not present

## 2022-11-22 DIAGNOSIS — F411 Generalized anxiety disorder: Secondary | ICD-10-CM | POA: Diagnosis not present

## 2022-11-22 DIAGNOSIS — R2681 Unsteadiness on feet: Secondary | ICD-10-CM | POA: Diagnosis not present

## 2022-11-22 DIAGNOSIS — R41841 Cognitive communication deficit: Secondary | ICD-10-CM | POA: Diagnosis not present

## 2022-11-22 DIAGNOSIS — M6281 Muscle weakness (generalized): Secondary | ICD-10-CM | POA: Diagnosis not present

## 2022-11-22 DIAGNOSIS — R4189 Other symptoms and signs involving cognitive functions and awareness: Secondary | ICD-10-CM | POA: Diagnosis not present

## 2022-11-23 DIAGNOSIS — R4189 Other symptoms and signs involving cognitive functions and awareness: Secondary | ICD-10-CM | POA: Diagnosis not present

## 2022-11-23 DIAGNOSIS — I10 Essential (primary) hypertension: Secondary | ICD-10-CM | POA: Diagnosis not present

## 2022-11-23 DIAGNOSIS — R2681 Unsteadiness on feet: Secondary | ICD-10-CM | POA: Diagnosis not present

## 2022-11-23 DIAGNOSIS — M6281 Muscle weakness (generalized): Secondary | ICD-10-CM | POA: Diagnosis not present

## 2022-11-23 DIAGNOSIS — R41841 Cognitive communication deficit: Secondary | ICD-10-CM | POA: Diagnosis not present

## 2022-11-23 DIAGNOSIS — F411 Generalized anxiety disorder: Secondary | ICD-10-CM | POA: Diagnosis not present

## 2022-11-26 DIAGNOSIS — R41841 Cognitive communication deficit: Secondary | ICD-10-CM | POA: Diagnosis not present

## 2022-11-26 DIAGNOSIS — F411 Generalized anxiety disorder: Secondary | ICD-10-CM | POA: Diagnosis not present

## 2022-11-26 DIAGNOSIS — R4189 Other symptoms and signs involving cognitive functions and awareness: Secondary | ICD-10-CM | POA: Diagnosis not present

## 2022-11-26 DIAGNOSIS — I10 Essential (primary) hypertension: Secondary | ICD-10-CM | POA: Diagnosis not present

## 2022-11-29 DIAGNOSIS — R41841 Cognitive communication deficit: Secondary | ICD-10-CM | POA: Diagnosis not present

## 2022-11-29 DIAGNOSIS — F411 Generalized anxiety disorder: Secondary | ICD-10-CM | POA: Diagnosis not present

## 2022-11-29 DIAGNOSIS — I10 Essential (primary) hypertension: Secondary | ICD-10-CM | POA: Diagnosis not present

## 2022-11-29 DIAGNOSIS — F419 Anxiety disorder, unspecified: Secondary | ICD-10-CM | POA: Diagnosis not present

## 2022-11-29 DIAGNOSIS — R4189 Other symptoms and signs involving cognitive functions and awareness: Secondary | ICD-10-CM | POA: Diagnosis not present

## 2022-11-30 DIAGNOSIS — I10 Essential (primary) hypertension: Secondary | ICD-10-CM | POA: Diagnosis not present

## 2022-11-30 DIAGNOSIS — R41841 Cognitive communication deficit: Secondary | ICD-10-CM | POA: Diagnosis not present

## 2022-11-30 DIAGNOSIS — F411 Generalized anxiety disorder: Secondary | ICD-10-CM | POA: Diagnosis not present

## 2022-11-30 DIAGNOSIS — R4189 Other symptoms and signs involving cognitive functions and awareness: Secondary | ICD-10-CM | POA: Diagnosis not present

## 2022-12-05 DIAGNOSIS — F411 Generalized anxiety disorder: Secondary | ICD-10-CM | POA: Diagnosis not present

## 2022-12-05 DIAGNOSIS — I10 Essential (primary) hypertension: Secondary | ICD-10-CM | POA: Diagnosis not present

## 2022-12-05 DIAGNOSIS — R4189 Other symptoms and signs involving cognitive functions and awareness: Secondary | ICD-10-CM | POA: Diagnosis not present

## 2022-12-05 DIAGNOSIS — R41841 Cognitive communication deficit: Secondary | ICD-10-CM | POA: Diagnosis not present

## 2022-12-11 DIAGNOSIS — M542 Cervicalgia: Secondary | ICD-10-CM | POA: Diagnosis not present

## 2022-12-11 DIAGNOSIS — S199XXA Unspecified injury of neck, initial encounter: Secondary | ICD-10-CM | POA: Diagnosis not present

## 2022-12-11 DIAGNOSIS — Z880 Allergy status to penicillin: Secondary | ICD-10-CM | POA: Diagnosis not present

## 2022-12-11 DIAGNOSIS — W19XXXA Unspecified fall, initial encounter: Secondary | ICD-10-CM | POA: Diagnosis not present

## 2022-12-11 DIAGNOSIS — S299XXA Unspecified injury of thorax, initial encounter: Secondary | ICD-10-CM | POA: Diagnosis not present

## 2022-12-11 DIAGNOSIS — Z87891 Personal history of nicotine dependence: Secondary | ICD-10-CM | POA: Diagnosis not present

## 2022-12-11 DIAGNOSIS — G4489 Other headache syndrome: Secondary | ICD-10-CM | POA: Diagnosis not present

## 2022-12-11 DIAGNOSIS — Z743 Need for continuous supervision: Secondary | ICD-10-CM | POA: Diagnosis not present

## 2022-12-11 DIAGNOSIS — R0789 Other chest pain: Secondary | ICD-10-CM | POA: Diagnosis not present

## 2022-12-11 DIAGNOSIS — G9389 Other specified disorders of brain: Secondary | ICD-10-CM | POA: Diagnosis not present

## 2022-12-11 DIAGNOSIS — S0990XA Unspecified injury of head, initial encounter: Secondary | ICD-10-CM | POA: Diagnosis not present

## 2022-12-11 DIAGNOSIS — Z7982 Long term (current) use of aspirin: Secondary | ICD-10-CM | POA: Diagnosis not present

## 2022-12-11 DIAGNOSIS — R519 Headache, unspecified: Secondary | ICD-10-CM | POA: Diagnosis not present

## 2022-12-11 DIAGNOSIS — Z79899 Other long term (current) drug therapy: Secondary | ICD-10-CM | POA: Diagnosis not present

## 2022-12-11 DIAGNOSIS — R079 Chest pain, unspecified: Secondary | ICD-10-CM | POA: Diagnosis not present

## 2022-12-13 DIAGNOSIS — R2689 Other abnormalities of gait and mobility: Secondary | ICD-10-CM | POA: Diagnosis not present

## 2022-12-13 DIAGNOSIS — Z9181 History of falling: Secondary | ICD-10-CM | POA: Diagnosis not present

## 2022-12-13 DIAGNOSIS — N1831 Chronic kidney disease, stage 3a: Secondary | ICD-10-CM | POA: Diagnosis not present

## 2022-12-15 DIAGNOSIS — F331 Major depressive disorder, recurrent, moderate: Secondary | ICD-10-CM | POA: Diagnosis not present

## 2022-12-15 DIAGNOSIS — F419 Anxiety disorder, unspecified: Secondary | ICD-10-CM | POA: Diagnosis not present

## 2022-12-26 ENCOUNTER — Other Ambulatory Visit: Payer: Medicare HMO

## 2023-01-21 ENCOUNTER — Telehealth: Payer: Self-pay

## 2023-01-21 NOTE — Telephone Encounter (Signed)
Left message for patient to call back to schedule Medicare Annual Wellness Visit   Last AWV  02/06/23  Please schedule at anytime with LB Lucas if patient calls the office back.    30 Minutes appointment   Any questions, please call me at [phone]

## 2023-04-17 ENCOUNTER — Telehealth: Payer: Self-pay

## 2023-06-03 NOTE — Telephone Encounter (Signed)
Error

## 2023-08-25 IMAGING — MG MM DIGITAL SCREENING BILAT W/ TOMO AND CAD
6 of 10 series · 6 of 30 positions shown · non-contrast
Comparison: Previous exams.

CLINICAL DATA: Screening.

EXAM:
DIGITAL SCREENING BILATERAL MAMMOGRAM WITH TOMOSYNTHESIS AND CAD
TECHNIQUE: Bilateral screening digital craniocaudal and mediolateral oblique
mammograms were obtained. Bilateral screening digital breast
tomosynthesis was performed. The images were evaluated with
computer-aided detection.

[L CC synth-2D]
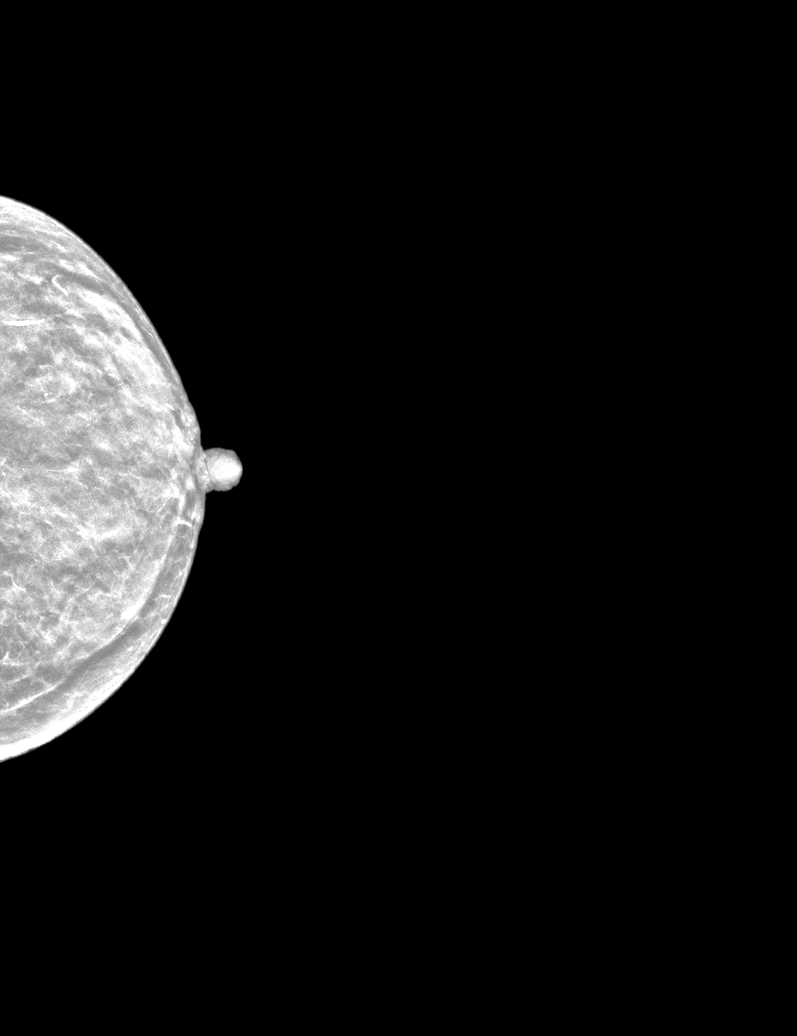

[R CC synth-2D (1 of 2)]
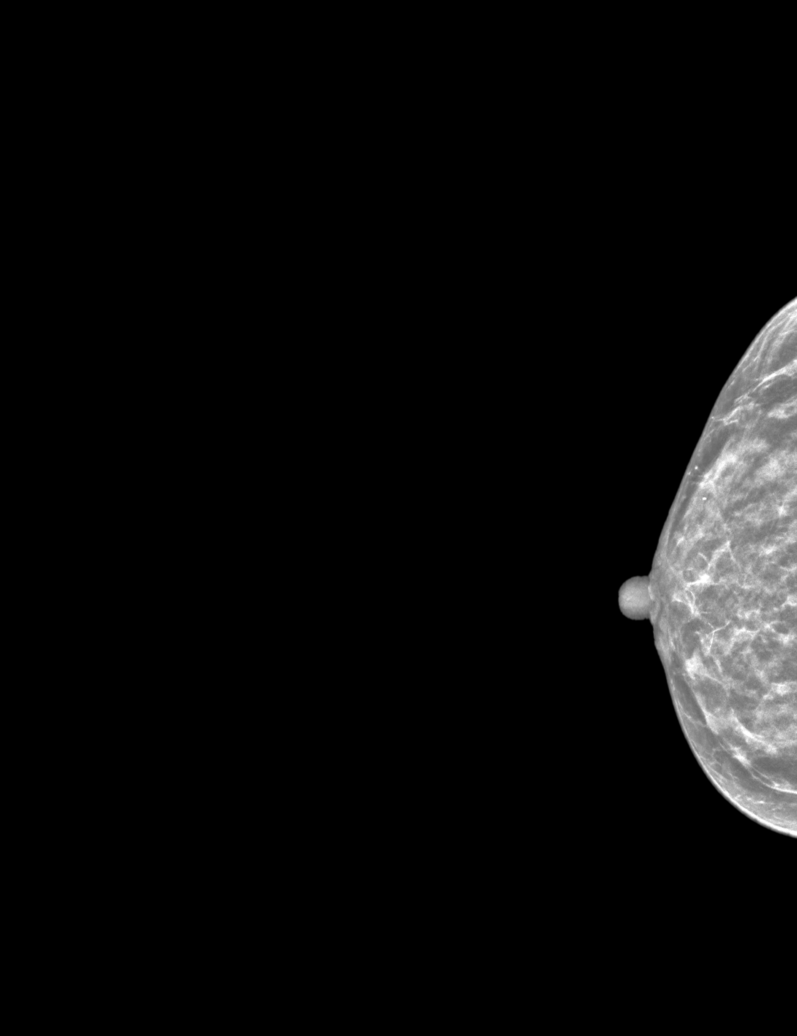

[L MLO synth-2D]
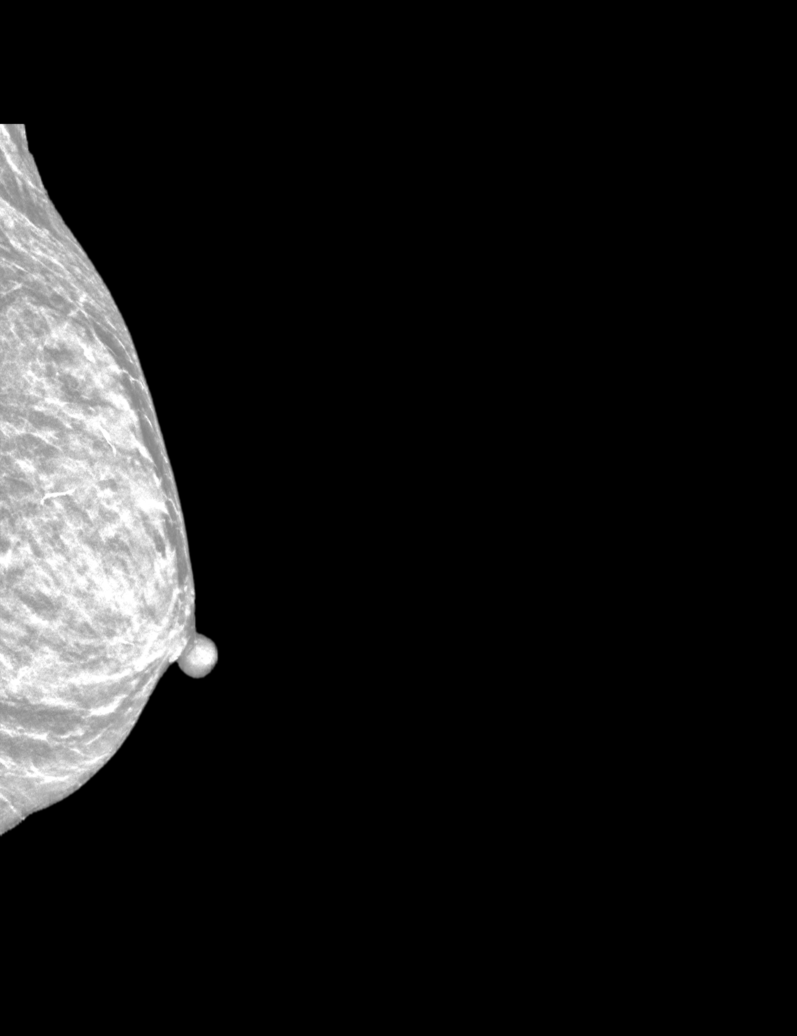

[R CC synth-2D (2 of 2)]
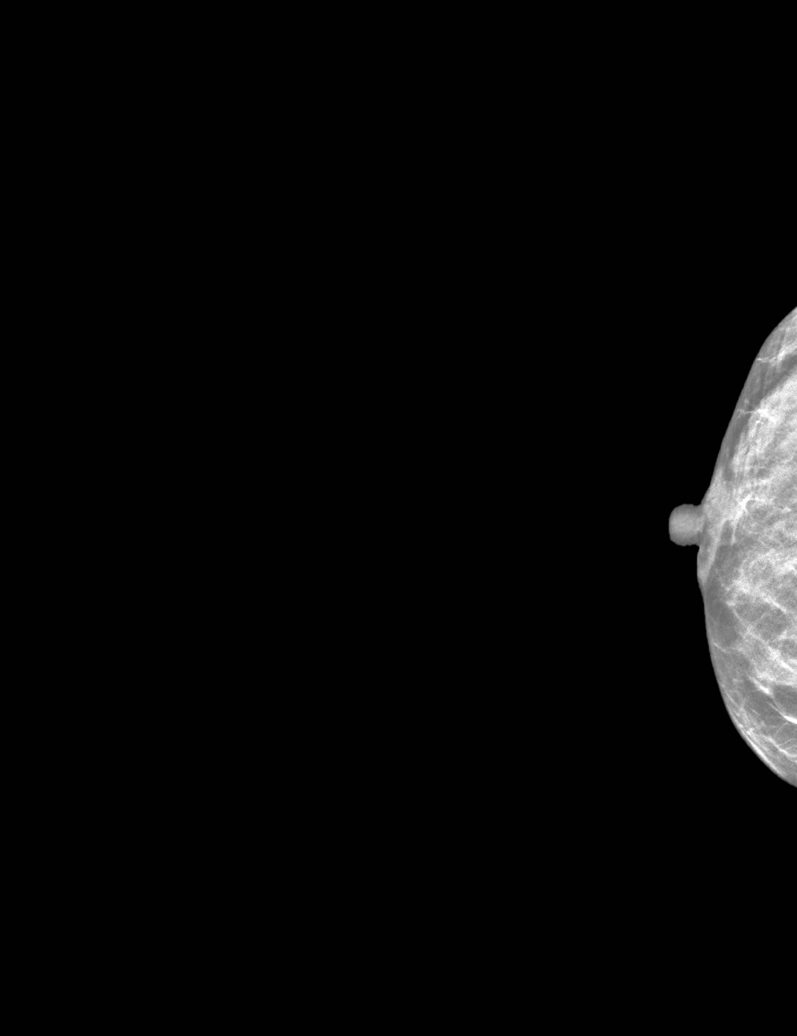

[R MLO synth-2D]
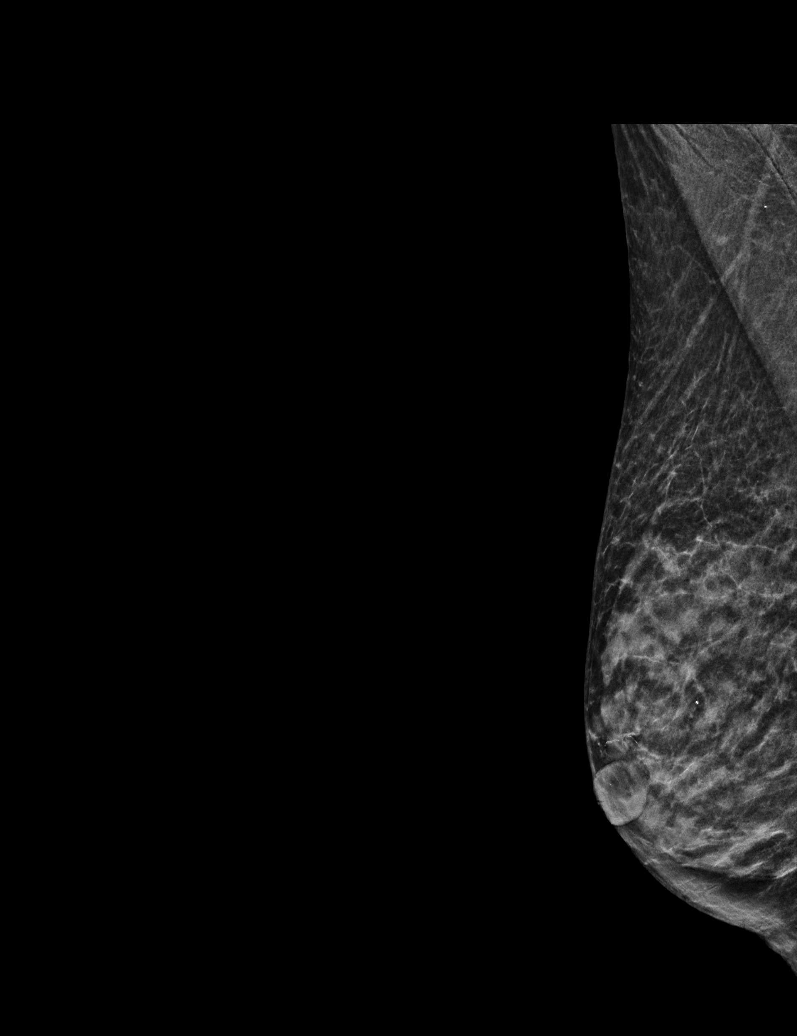

[R CC tomo · tomo slice 17/32.0]
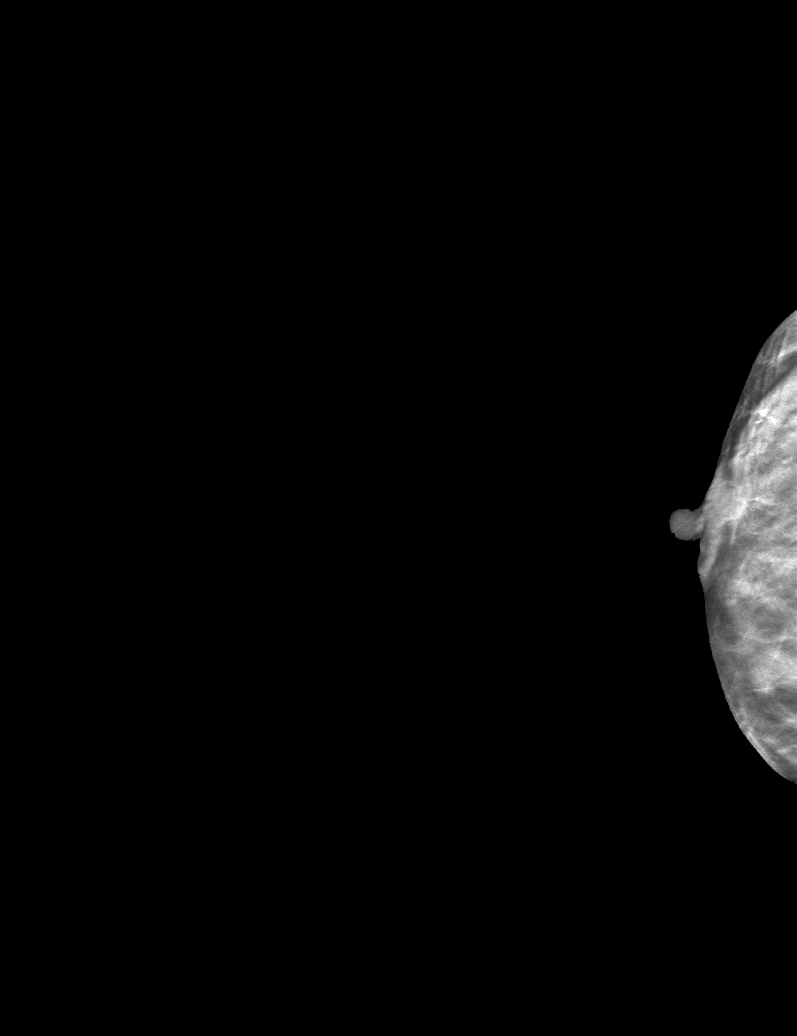

[6 of 30 positions shown; findings below may reference images not displayed]

ACR Breast Density Category d: The breast tissue is extremely dense,
which lowers the sensitivity of mammography.
FINDINGS: In the left breast, a possible asymmetry warrants further
evaluation. In the right breast, no findings suspicious for
malignancy.
IMPRESSION: Further evaluation is suggested for possible asymmetry in the left
breast.

RECOMMENDATION:
Diagnostic mammogram and possibly ultrasound of the left breast.
(Code:72-B-DDC)

The patient will be contacted regarding the findings, and additional
imaging will be scheduled.

BI-RADS CATEGORY  0: Incomplete. Need additional imaging evaluation
and/or prior mammograms for comparison.

## 2024-01-01 ENCOUNTER — Encounter: Payer: Self-pay | Admitting: Acute Care

## 2024-01-02 ENCOUNTER — Telehealth: Payer: Self-pay

## 2024-01-02 NOTE — Telephone Encounter (Signed)
Sister to update staff that patient will no longer be participating in the LCS program. Advises she has moved to [place] to an assisted living due to advanced dementia and alzheimers. Pt will not be completing annual LCS.
